# Patient Record
Sex: Female | Born: 1975 | Race: White | Hispanic: No | Smoking: Current every day smoker
Health system: Southern US, Community
[De-identification: ages and names within clinical notes are randomized; demographics above are authoritative.]

## PROBLEM LIST (undated history)

## (undated) DIAGNOSIS — A539 Syphilis, unspecified: Secondary | ICD-10-CM

## (undated) DIAGNOSIS — K760 Fatty (change of) liver, not elsewhere classified: Secondary | ICD-10-CM

## (undated) DIAGNOSIS — Z8619 Personal history of other infectious and parasitic diseases: Secondary | ICD-10-CM

## (undated) DIAGNOSIS — R51 Headache: Secondary | ICD-10-CM

## (undated) DIAGNOSIS — F1491 Cocaine use, unspecified, in remission: Secondary | ICD-10-CM

## (undated) DIAGNOSIS — D069 Carcinoma in situ of cervix, unspecified: Secondary | ICD-10-CM

## (undated) DIAGNOSIS — A499 Bacterial infection, unspecified: Secondary | ICD-10-CM

## (undated) DIAGNOSIS — Z87440 Personal history of urinary (tract) infections: Secondary | ICD-10-CM

## (undated) DIAGNOSIS — IMO0002 Reserved for concepts with insufficient information to code with codable children: Secondary | ICD-10-CM

## (undated) DIAGNOSIS — F1010 Alcohol abuse, uncomplicated: Secondary | ICD-10-CM

## (undated) DIAGNOSIS — R87619 Unspecified abnormal cytological findings in specimens from cervix uteri: Secondary | ICD-10-CM

## (undated) DIAGNOSIS — R102 Pelvic and perineal pain: Secondary | ICD-10-CM

## (undated) DIAGNOSIS — Z87898 Personal history of other specified conditions: Secondary | ICD-10-CM

## (undated) DIAGNOSIS — R002 Palpitations: Secondary | ICD-10-CM

## (undated) DIAGNOSIS — T7840XA Allergy, unspecified, initial encounter: Secondary | ICD-10-CM

## (undated) DIAGNOSIS — F1990 Other psychoactive substance use, unspecified, uncomplicated: Secondary | ICD-10-CM

## (undated) DIAGNOSIS — A630 Anogenital (venereal) warts: Secondary | ICD-10-CM

## (undated) DIAGNOSIS — A599 Trichomoniasis, unspecified: Secondary | ICD-10-CM

## (undated) DIAGNOSIS — B379 Candidiasis, unspecified: Secondary | ICD-10-CM

## (undated) DIAGNOSIS — N926 Irregular menstruation, unspecified: Secondary | ICD-10-CM

## (undated) DIAGNOSIS — F419 Anxiety disorder, unspecified: Secondary | ICD-10-CM

## (undated) DIAGNOSIS — I10 Essential (primary) hypertension: Secondary | ICD-10-CM

## (undated) HISTORY — DX: Allergy, unspecified, initial encounter: T78.40XA

## (undated) HISTORY — PX: OTHER SURGICAL HISTORY: SHX169

## (undated) HISTORY — DX: Essential (primary) hypertension: I10

## (undated) HISTORY — DX: Anogenital (venereal) warts: A63.0

## (undated) HISTORY — DX: Reserved for concepts with insufficient information to code with codable children: IMO0002

## (undated) HISTORY — DX: Pelvic and perineal pain: R10.2

## (undated) HISTORY — DX: Syphilis, unspecified: A53.9

## (undated) HISTORY — DX: Personal history of other infectious and parasitic diseases: Z86.19

## (undated) HISTORY — DX: Other psychoactive substance use, unspecified, uncomplicated: F19.90

## (undated) HISTORY — DX: Carcinoma in situ of cervix, unspecified: D06.9

## (undated) HISTORY — DX: Personal history of urinary (tract) infections: Z87.440

## (undated) HISTORY — DX: Alcohol abuse, uncomplicated: F10.10

## (undated) HISTORY — DX: Trichomoniasis, unspecified: A59.9

## (undated) HISTORY — PX: WISDOM TOOTH EXTRACTION: SHX21

## (undated) HISTORY — DX: Palpitations: R00.2

## (undated) HISTORY — DX: Bacterial infection, unspecified: A49.9

## (undated) HISTORY — DX: Personal history of other specified conditions: Z87.898

## (undated) HISTORY — DX: Irregular menstruation, unspecified: N92.6

## (undated) HISTORY — DX: Candidiasis, unspecified: B37.9

## (undated) HISTORY — DX: Unspecified abnormal cytological findings in specimens from cervix uteri: R87.619

## (undated) SURGERY — Surgical Case
Anesthesia: *Unknown

---

## 1998-08-12 ENCOUNTER — Other Ambulatory Visit: Admission: RE | Admit: 1998-08-12 | Discharge: 1998-08-12 | Payer: Self-pay | Admitting: *Deleted

## 1999-12-24 ENCOUNTER — Inpatient Hospital Stay (HOSPITAL_COMMUNITY): Admission: AD | Admit: 1999-12-24 | Discharge: 1999-12-26 | Payer: Self-pay | Admitting: Obstetrics and Gynecology

## 2000-01-25 ENCOUNTER — Other Ambulatory Visit: Admission: RE | Admit: 2000-01-25 | Discharge: 2000-01-25 | Payer: Self-pay | Admitting: Obstetrics & Gynecology

## 2002-12-03 ENCOUNTER — Encounter: Admission: RE | Admit: 2002-12-03 | Discharge: 2002-12-03 | Payer: Self-pay | Admitting: Obstetrics and Gynecology

## 2003-10-17 ENCOUNTER — Ambulatory Visit (HOSPITAL_COMMUNITY): Admission: RE | Admit: 2003-10-17 | Discharge: 2003-10-17 | Payer: Self-pay | Admitting: Ophthalmology

## 2003-10-17 ENCOUNTER — Encounter (INDEPENDENT_AMBULATORY_CARE_PROVIDER_SITE_OTHER): Payer: Self-pay | Admitting: *Deleted

## 2005-12-19 DIAGNOSIS — D069 Carcinoma in situ of cervix, unspecified: Secondary | ICD-10-CM

## 2005-12-19 HISTORY — DX: Carcinoma in situ of cervix, unspecified: D06.9

## 2007-01-08 ENCOUNTER — Encounter (INDEPENDENT_AMBULATORY_CARE_PROVIDER_SITE_OTHER): Payer: Self-pay | Admitting: Specialist

## 2007-01-08 ENCOUNTER — Ambulatory Visit (HOSPITAL_COMMUNITY): Admission: RE | Admit: 2007-01-08 | Discharge: 2007-01-08 | Payer: Self-pay | Admitting: Obstetrics and Gynecology

## 2007-01-08 ENCOUNTER — Inpatient Hospital Stay (HOSPITAL_COMMUNITY): Admission: AD | Admit: 2007-01-08 | Discharge: 2007-01-08 | Payer: Self-pay | Admitting: Obstetrics and Gynecology

## 2007-12-20 DIAGNOSIS — Z87898 Personal history of other specified conditions: Secondary | ICD-10-CM

## 2007-12-20 HISTORY — DX: Personal history of other specified conditions: Z87.898

## 2008-12-19 DIAGNOSIS — R002 Palpitations: Secondary | ICD-10-CM

## 2008-12-19 HISTORY — DX: Palpitations: R00.2

## 2009-12-19 DIAGNOSIS — A630 Anogenital (venereal) warts: Secondary | ICD-10-CM

## 2009-12-19 HISTORY — DX: Anogenital (venereal) warts: A63.0

## 2010-01-22 ENCOUNTER — Encounter: Admission: RE | Admit: 2010-01-22 | Discharge: 2010-01-22 | Payer: Self-pay | Admitting: Family Medicine

## 2011-04-08 DIAGNOSIS — G43909 Migraine, unspecified, not intractable, without status migrainosus: Secondary | ICD-10-CM | POA: Insufficient documentation

## 2011-05-06 NOTE — Op Note (Signed)
NAMEAUBREYANNA, [name] NO.:  [id number]   MEDICAL RECORD NO.:  [id number]          PATIENT TYPE:  AMB   LOCATION:  SDC                           FACILITY:  [facility]   PHYSICIAN:  Janine Limbo, M.D.DATE OF BIRTH:  1976-02-24   DATE OF PROCEDURE:  01/08/2007  DATE OF DISCHARGE:                               OPERATIVE REPORT   PREOPERATIVE DIAGNOSIS:  Recurrent cervical intraepithelial neoplasia  III.   POSTOPERATIVE DIAGNOSIS:  Recurrent cervical intraepithelial neoplasia  III.   PROCEDURE:  Laser conization of the cervix.   SURGEON:  Dr. Leonard Schwartz.   FIRST ASSISTANT:  None.   ANESTHETIC:  General.   DISPOSITION:  [name] is a 35 year old female who has recurrent  cervical intraepithelial neoplasia III.  She has had a prior cryotherapy  and a prior conization of cervix.  She understands the indications for  her procedure and she accepts the risk of, but not limited to,  anesthetic complications, bleeding, infection, and possible damage to  the surrounding organs.   FINDINGS:  The uterus was normal size.  No adnexal masses were  appreciated.  The cervix was carefully inspected using the colposcope.  We also applied Lugol's solution to the cervix.  There was a small area  of nonstaining at the OS of the cervix at approximately 11 o'clock.   PROCEDURE:  The patient was taken to the operating room where a general  anesthetic was given.  The patient's perineum was prepped with Betadine.  The bladder was drained of urine.  The patient was sterilely draped  including wet towels.  A defocused beam of the laser was tested.  The  laser was set 10 watts.  Colposcopy was performed.  The cervix was  painted with acetic acid and colposcopy was again performed.  Lugol's  solution was placed on the cervix.  There was a small nonstaining area  at approximately 11 o'clock.  We then outlined that area of the small  conization using a defocused beam of  the laser.  The laser was then set  at 10 watts and we ablated the periphery of the cervix and removed a  very shallow small conization specimen.  The specimen was sent to  pathology for evaluation.  We then ablated the area of the cervix that  had been cut using the laser.  We ablated the periphery.  Hemostasis was  achieved using Monsel solution and pressure.  The endocervix was sounded  with a Betadine-soaked uterine sound.  The endocervical os was noted to  be patent.  The patient was given 30 mg of Toradol IV and 30 mg of  Toradol IM intraoperatively.  She was awakened from her anesthetic  without difficulty.  She was taken to the recovery room in stable  condition.   FOLLOW-UP INSTRUCTIONS:  The patient was given a prescription for  Vicodin and she will take 1-2 tablets every 4 hours as needed for pain.  She will use ibuprofen 800 mg every hours as needed for mild to moderate  pain.  She will return to see Dr. Stefano Gaul in 2-3  weeks for follow-up  examination.  She was given a copy of the postoperative instruction  sheet as prepared by the [facility] for patients who  have undergone a dilatation and curettage, but then was modified for a  conization of the cervix.      Janine Limbo, M.D.  Electronically Signed     AVS/MEDQ  D:  01/08/2007  T:  01/08/2007  Job:  [id number]

## 2011-05-06 NOTE — H&P (Signed)
[name], BOYKO NO.:  [id number]   MEDICAL RECORD NO.:  [id number]          PATIENT TYPE:  AMB   LOCATION:  SDC                           [facility]:  [facility]   PHYSICIAN:  Janine Limbo, M.D.DATE OF BIRTH:  1976-08-24   DATE OF ADMISSION:  DATE OF DISCHARGE:                              HISTORY & PHYSICAL   PREADMISSION HISTORY AND PHYSICAL   HISTORY OF PRESENT ILLNESS:  [name] is a 35 year old female, para  1-0-0-1, who presents for laser conization of the cervix.  The patient  has been followed at the [facility] and Gynecology  Division of [facility].  The patient has had multiple  abnormal Pap smears in the past.  Colposcopically-directed biopsy showed  cervical intraepithelial neoplasia III.  The patient has had this  diagnosis in the past, and she has been treated with cryosurgery as well  as a conization of the cervix.  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   DRUG ALLERGIES:  No known drug allergies.   OBSTETRICAL HISTORY:  The patient has had one vaginal delivery at term.   PAST MEDICAL HISTORY:  The patient has had cryotherapy and a conization  as mentioned above.  She is very anxious, although I do not know that a  specific diagnosis has been assigned.   SOCIAL HISTORY:  The patient smokes 10 cigarettes each day.  She drinks  alcohol socially.  She denies other recreational drug uses.   REVIEW OF SYSTEMS:  Noncontributory.   FAMILY HISTORY:  Noncontributory.   PHYSICAL EXAMINATION:  VITAL SIGNS:  Weight is 144 pounds, height is 5  feet 1 inch.  HEENT:  Within normal limits.  CHEST:  Clear.  HEART:  Regular rate and rhythm.  BREASTS:  Without masses.  ABDOMEN:  Nontender.  EXTREMITIES:  Grossly normal.  NEUROLOGIC EXAM:  Grossly normal.  PELVIC EXAM:  External genitalia is normal, vagina is normal, cervix is  nontender, the uterus is normal size, shape, and consistency,  adnexa no  masses, and rectovaginal exam confirms.   ASSESSMENT:  Recurrent cervical intraepithelial neoplasia III (status  post cryotherapy and status post conization of the cervix).   PLAN:  The patient will undergo a laser conization of the cervix.  She  understands the indications toward her surgical procedure, and she  accepts the risk of, but not limited to, anesthetic complications,  bleeding, infections, and possible damage to the surrounding organs.      Janine Limbo, M.D.  Electronically Signed     AVS/MEDQ  D:  01/05/2007  T:  01/05/2007  Job:  [id number]

## 2011-09-20 DIAGNOSIS — Z87898 Personal history of other specified conditions: Secondary | ICD-10-CM

## 2011-09-20 HISTORY — DX: Personal history of other specified conditions: Z87.898

## 2011-11-17 ENCOUNTER — Encounter (HOSPITAL_COMMUNITY): Payer: Self-pay | Admitting: *Deleted

## 2011-11-18 ENCOUNTER — Other Ambulatory Visit: Payer: Self-pay | Admitting: Obstetrics and Gynecology

## 2011-11-22 ENCOUNTER — Encounter (HOSPITAL_COMMUNITY): Payer: Self-pay | Admitting: Pharmacist

## 2011-11-25 NOTE — H&P (Signed)
[name], [name] NO.:  [id number]  MEDICAL RECORD NO.:  [id number]  LOCATION:  PERIO                         [facility]ACILITY:  [facility]  PHYSICIAN:  Janine Limbo, M.D.DATE OF BIRTH:  [birth year]  DATE OF ADMISSION:  11/17/2011 DATE OF DISCHARGE:                             HISTORY & PHYSICAL   DATE FOR SURGERY:  November 30, 2011.  HISTORY OF PRESENT ILLNESS:  Ms. Munford is a 35 year old female, para 1-0-0-1, who presents for a conization of the cervix, and dilatation and curettage.  The patient has been followed at the [facility] and Gynecology Division of [facility]. The patient has a past history of cervical intraepithelial neoplasia III.  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 The patient had a conization of the cervix performed in January 2008. The pathology report returned showing a low-grade squamous intraepithelial lesion.  The margins were not involved.  The patient had a Pap smear in October 2012 that showed a high-grade squamous intraepithelial lesion.  Colposcopic biopsies were performed.  The pathology report returned showing mild dysplasia with HPV infection, focal high-grade dysplasia, and the endocervical curettage returned showing adenocarcinoma in situ and fragments of high-grade squamous intraepithelial lesion.  The patient is currently not sexually active, and she does not use contraception.  DRUG ALLERGIES:  No known drug allergies.  OBSTETRICAL HISTORY:  The patient has had one term vaginal delivery.  PAST MEDICAL HISTORY:  The patient has a history of anxiety.  SOCIAL HISTORY:  The patient smokes 1 pack of cigarettes each day.  She drinks alcohol socially.  She denies other recreational drug uses.  REVIEW OF SYSTEMS:  The patient has anxiety symptoms.  FAMILY HISTORY:  Noncontributory.  PHYSICAL EXAM:  VITAL SIGNS:  Weight is 137 pounds, height is 5 feet  1/2 inches. HEENT:  Within normal limits. CHEST:  Clear. HEART:  Regular rate and rhythm. BREAST:  Her breasts are without masses. ABDOMEN:  Nontender. EXTREMITIES:  Grossly normal. NEUROLOGIC:  Grossly Normal. PELVIC:  External genitalia is normal.  Vagina is normal.  Cervix has slight scarring from her prior conization.  The uterus is normal size, shape, and consistency, and adnexa confirms, rectovaginal exam confirms.  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 2. History of cervical dysplasia. 3. Status post prior conization of the cervix. 4. Anxiety.  PLAN:  The patient will undergo a cold-knife conization of the cervix, and a dilatation and curettage.  She understands the indications for her surgical procedure, and she accepts the risks of, but not limited to, anesthetic complications, bleeding, infections, and possible damage to the surrounding organs.     Janine Limbo, M.D.     AVS/MEDQ  D:  11/25/2011  T:  11/25/2011  Job:  [id number]

## 2011-11-30 ENCOUNTER — Encounter (HOSPITAL_COMMUNITY)
Admission: RE | Disposition: A | Discharge status: Home / Self Care | Payer: Self-pay | Source: Ambulatory Visit | Attending: Obstetrics and Gynecology

## 2011-11-30 ENCOUNTER — Encounter (HOSPITAL_COMMUNITY): Payer: Self-pay | Admitting: Anesthesiology

## 2011-11-30 ENCOUNTER — Ambulatory Visit (HOSPITAL_COMMUNITY): Payer: Self-pay | Admitting: Anesthesiology

## 2011-11-30 ENCOUNTER — Encounter (HOSPITAL_COMMUNITY): Payer: Self-pay | Admitting: *Deleted

## 2011-11-30 ENCOUNTER — Ambulatory Visit (HOSPITAL_COMMUNITY)
Admission: RE | Admit: 2011-11-30 | Discharge: 2011-11-30 | Disposition: A | Discharge status: Home / Self Care | Payer: Self-pay | Source: Ambulatory Visit | Attending: Obstetrics and Gynecology | Admitting: Obstetrics and Gynecology

## 2011-11-30 ENCOUNTER — Other Ambulatory Visit: Payer: Self-pay | Admitting: Obstetrics and Gynecology

## 2011-11-30 DIAGNOSIS — D069 Carcinoma in situ of cervix, unspecified: Secondary | ICD-10-CM

## 2011-11-30 DIAGNOSIS — C539 Malignant neoplasm of cervix uteri, unspecified: Secondary | ICD-10-CM | POA: Insufficient documentation

## 2011-11-30 HISTORY — DX: Headache: R51

## 2011-11-30 HISTORY — DX: Anxiety disorder, unspecified: F41.9

## 2011-11-30 HISTORY — PX: DILATION AND CURETTAGE OF UTERUS: SHX78

## 2011-11-30 HISTORY — DX: Carcinoma in situ of cervix, unspecified: D06.9

## 2011-11-30 HISTORY — PX: CERVICAL CONIZATION W/BX: SHX1330

## 2011-11-30 LAB — CBC
HCT: 40.9 % (ref 36.0–46.0)
Hemoglobin: 13.8 g/dL (ref 12.0–15.0)
MCH: 32.6 pg (ref 26.0–34.0)
MCHC: 33.7 g/dL (ref 30.0–36.0)
MCV: 96.7 fL (ref 78.0–100.0)
Platelets: 232 10*3/uL (ref 150–400)
RBC: 4.23 MIL/uL (ref 3.87–5.11)
RDW: 13.5 % (ref 11.5–15.5)
WBC: 9.8 10*3/uL (ref 4.0–10.5)

## 2011-11-30 LAB — RAPID URINE DRUG SCREEN, HOSP PERFORMED
Amphetamines: NOT DETECTED
Barbiturates: POSITIVE — AB
Benzodiazepines: NOT DETECTED
Cocaine: NOT DETECTED
Opiates: NOT DETECTED
Tetrahydrocannabinol: NOT DETECTED

## 2011-11-30 LAB — PREGNANCY, URINE: Preg Test, Ur: NEGATIVE

## 2011-11-30 SURGERY — CONE BIOPSY, CERVIX
Anesthesia: General | Site: Uterus | Wound class: Clean Contaminated

## 2011-11-30 MED ORDER — LIDOCAINE HCL (CARDIAC) 20 MG/ML IV SOLN
INTRAVENOUS | Status: AC
  Filled 2011-11-30: qty 5

## 2011-11-30 MED ORDER — ONDANSETRON HCL 4 MG/2ML IJ SOLN
INTRAMUSCULAR | Status: DC | PRN
  Administered 2011-11-30: 4 mg via INTRAVENOUS

## 2011-11-30 MED ORDER — MIDAZOLAM HCL 5 MG/5ML IJ SOLN
INTRAMUSCULAR | Status: DC | PRN
  Administered 2011-11-30: 2 mg via INTRAVENOUS

## 2011-11-30 MED ORDER — DEXAMETHASONE SODIUM PHOSPHATE 10 MG/ML IJ SOLN
INTRAMUSCULAR | Status: AC
  Filled 2011-11-30: qty 1

## 2011-11-30 MED ORDER — HYDROMORPHONE HCL PF 1 MG/ML IJ SOLN
INTRAMUSCULAR | Status: AC
  Administered 2011-11-30: 0.5 mg via INTRAVENOUS
  Filled 2011-11-30: qty 1

## 2011-11-30 MED ORDER — FENTANYL CITRATE 0.05 MG/ML IJ SOLN
INTRAMUSCULAR | Status: AC
  Filled 2011-11-30: qty 2

## 2011-11-30 MED ORDER — PROPOFOL 10 MG/ML IV EMUL
INTRAVENOUS | Status: AC
  Filled 2011-11-30: qty 20

## 2011-11-30 MED ORDER — KETOROLAC TROMETHAMINE 30 MG/ML IJ SOLN
INTRAMUSCULAR | Status: AC
  Filled 2011-11-30: qty 1

## 2011-11-30 MED ORDER — ONDANSETRON HCL 8 MG PO TABS: 8.0000 mg | ORAL_TABLET | Freq: Three times a day (TID) | ORAL | Status: AC | PRN

## 2011-11-30 MED ORDER — KETOROLAC TROMETHAMINE 10 MG PO TABS: 10.0000 mg | ORAL_TABLET | Freq: Four times a day (QID) | ORAL | Status: AC | PRN

## 2011-11-30 MED ORDER — KETOROLAC TROMETHAMINE 30 MG/ML IJ SOLN
INTRAMUSCULAR | Status: DC | PRN
  Administered 2011-11-30: 30 mg via INTRAVENOUS

## 2011-11-30 MED ORDER — LUGOLS 5 % PO SOLN
ORAL | Status: DC | PRN
  Administered 2011-11-30: 0.1 mL

## 2011-11-30 MED ORDER — METOCLOPRAMIDE HCL 5 MG/ML IJ SOLN: 10.0000 mg | Freq: Once | INTRAMUSCULAR | Status: DC | PRN

## 2011-11-30 MED ORDER — MEPERIDINE HCL 25 MG/ML IJ SOLN: 6.2500 mg | INTRAMUSCULAR | Status: DC | PRN

## 2011-11-30 MED ORDER — ONDANSETRON HCL 4 MG/2ML IJ SOLN
INTRAMUSCULAR | Status: AC
Start: 2011-11-30 — End: 2011-11-30
  Filled 2011-11-30: qty 2

## 2011-11-30 MED ORDER — PROPOFOL 10 MG/ML IV EMUL
INTRAVENOUS | Status: DC | PRN
  Administered 2011-11-30: 200 mg via INTRAVENOUS

## 2011-11-30 MED ORDER — HYDROMORPHONE HCL PF 1 MG/ML IJ SOLN
0.2500 mg | INTRAMUSCULAR | Status: AC | PRN
  Administered 2011-11-30 (×8): 0.5 mg via INTRAVENOUS

## 2011-11-30 MED ORDER — KETOROLAC TROMETHAMINE 60 MG/2ML IM SOLN
INTRAMUSCULAR | Status: AC
  Filled 2011-11-30: qty 2

## 2011-11-30 MED ORDER — BUPIVACAINE-EPINEPHRINE 0.5% -1:200000 IJ SOLN
INTRAMUSCULAR | Status: DC | PRN
  Administered 2011-11-30: 20 mL

## 2011-11-30 MED ORDER — FENTANYL CITRATE 0.05 MG/ML IJ SOLN
INTRAMUSCULAR | Status: DC | PRN
  Administered 2011-11-30: 100 ug via INTRAVENOUS

## 2011-11-30 MED ORDER — LACTATED RINGERS IV SOLN
INTRAVENOUS | Status: DC
  Administered 2011-11-30 (×2): via INTRAVENOUS

## 2011-11-30 MED ORDER — KETOROLAC TROMETHAMINE 60 MG/2ML IM SOLN
INTRAMUSCULAR | Status: DC | PRN
  Administered 2011-11-30: 30 mg via INTRAMUSCULAR

## 2011-11-30 MED ORDER — LIDOCAINE HCL (CARDIAC) 20 MG/ML IV SOLN
INTRAVENOUS | Status: DC | PRN
  Administered 2011-11-30: 60 mg via INTRAVENOUS

## 2011-11-30 MED ORDER — DEXAMETHASONE SODIUM PHOSPHATE 4 MG/ML IJ SOLN
INTRAMUSCULAR | Status: DC | PRN
  Administered 2011-11-30: 10 mg via INTRAVENOUS

## 2011-11-30 MED ORDER — MIDAZOLAM HCL 2 MG/2ML IJ SOLN
INTRAMUSCULAR | Status: AC
  Filled 2011-11-30: qty 2

## 2011-11-30 MED ORDER — OXYCODONE-ACETAMINOPHEN 5-325 MG PO TABS: 1.0000 | ORAL_TABLET | ORAL | Status: AC | PRN

## 2011-11-30 SURGICAL SUPPLY — 29 items
APPLICATOR COTTON TIP 6IN STRL (MISCELLANEOUS) IMPLANT
BLADE SURG 11 STRL SS (BLADE) ×2 IMPLANT
CATH ROBINSON RED A/P 16FR (CATHETERS) ×2 IMPLANT
CLOTH BEACON ORANGE TIMEOUT ST (SAFETY) ×2 IMPLANT
CONTAINER PREFILL 10% NBF 60ML (FORM) ×4 IMPLANT
COUNTER NEEDLE 1200 MAGNETIC (NEEDLE) ×2 IMPLANT
DECANTER SPIKE VIAL GLASS SM (MISCELLANEOUS) ×2 IMPLANT
ELECT REM PT RETURN 9FT ADLT (ELECTROSURGICAL) ×2
ELECTRODE REM PT RTRN 9FT ADLT (ELECTROSURGICAL) ×1 IMPLANT
GAUZE SPONGE 4X4 16PLY XRAY LF (GAUZE/BANDAGES/DRESSINGS) IMPLANT
GLOVE BIOGEL PI IND STRL 8.5 (GLOVE) ×1 IMPLANT
GLOVE BIOGEL PI INDICATOR 8.5 (GLOVE) ×1
GLOVE ECLIPSE 8.0 STRL XLNG CF (GLOVE) ×4 IMPLANT
GOWN PREVENTION PLUS LG XLONG (DISPOSABLE) ×2 IMPLANT
NEEDLE SPNL 22GX3.5 QUINCKE BK (NEEDLE) ×2 IMPLANT
NS IRRIG 1000ML POUR BTL (IV SOLUTION) ×2 IMPLANT
PACK VAGINAL MINOR WOMEN LF (CUSTOM PROCEDURE TRAY) ×2 IMPLANT
PAD PREP 24X48 CUFFED NSTRL (MISCELLANEOUS) ×2 IMPLANT
PENCIL BUTTON HOLSTER BLD 10FT (ELECTRODE) ×2 IMPLANT
SCOPETTES 8  STERILE (MISCELLANEOUS) ×2
SCOPETTES 8 STERILE (MISCELLANEOUS) ×2 IMPLANT
SPONGE SURGIFOAM ABS GEL 12-7 (HEMOSTASIS) IMPLANT
SUT VICRYL 0 UR6 27IN ABS (SUTURE) ×10 IMPLANT
SYR CONTROL 10ML LL (SYRINGE) ×2 IMPLANT
SYR TB 1ML 25GX5/8 (SYRINGE) IMPLANT
TOWEL OR 17X24 6PK STRL BLUE (TOWEL DISPOSABLE) ×4 IMPLANT
TUBING NON-CON 1/4 X 20 CONN (TUBING) ×2 IMPLANT
WATER STERILE IRR 1000ML POUR (IV SOLUTION) IMPLANT
YANKAUER SUCT BULB TIP NO VENT (SUCTIONS) ×2 IMPLANT

## 2011-11-30 NOTE — Progress Notes (Signed)
Pt appears to be much more comfortable. Patient is sitting up in bed, sipping on water.  Pt has stopped crying at this time.  Pt states that her pan is still a 6/10.  Pt is very pleasant at this time.

## 2011-11-30 NOTE — Anesthesia Postprocedure Evaluation (Signed)
  Anesthesia Post-op Note  Patient: [name]  Procedure(s) Performed:  CONIZATION CERVIX WITH BIOPSY; DILATATION AND CURETTAGE (D&C)  Patient Location: PACU  Anesthesia Type: General  Level of Consciousness: awake, alert  and oriented  Airway and Oxygen Therapy: Patient Spontanous Breathing  Post-op Pain: mild  Post-op Assessment: Post-op Vital signs reviewed, Patient's Cardiovascular Status Stable, Respiratory Function Stable, Patent Airway, No signs of Nausea or vomiting, Adequate PO intake and Pain level controlled  Post-op Vital Signs: Reviewed and stable  Complications: No apparent anesthesia complications

## 2011-11-30 NOTE — H&P (Signed)
The patient was interviewed and examined today.  The previously documented history and physical examination was reviewed. There are no changes except that the patient states that she is on Methadone for opiate addiction. The operative procedure was reviewed. The risks and benefits were outlined again. The specific risks include, but are not limited to, anesthetic complications, bleeding, infections, and possible damage to the surrounding organs. The patient's questions were answered.  We are ready to proceed as outlined. The likelihood of the patient achieving the goals of this procedure is very likely.   Leonard Schwartz, M.D.

## 2011-11-30 NOTE — Transfer of Care (Signed)
Immediate Anesthesia Transfer of Care Note  Patient: [name]  Procedure(s) Performed:  CONIZATION CERVIX WITH BIOPSY; DILATATION AND CURETTAGE (D&C)  Patient Location: PACU  Anesthesia Type: General  Level of Consciousness: awake  Airway & Oxygen Therapy: Patient Spontanous Breathing and Patient connected to nasal cannula oxygen  Post-op Assessment: Report given to PACU RN  Post vital signs: Reviewed and stable  Complications: No apparent anesthesia complications

## 2011-11-30 NOTE — Progress Notes (Signed)
MD at pt bedside and discussing surgery and pain management at home.

## 2011-11-30 NOTE — Op Note (Signed)
OPERATIVE NOTE  [name]  DOB:    24-Oct-1976  MRN:    [id number]  CSN:    [id number]  Date of Surgery:  11/30/2011  Preoperative Diagnosis:  Adenocarcinoma in situ of the endocervix  Postoperative Diagnosis:  Adenocarcinoma in situ of the endocervix  Procedure:  Cold knife conization  Dilatation and curettage  Surgeon:  Leonard Schwartz, M.D.  Assistant:  None  Anesthetic:  General  Disposition:  The patient presents with a history of abnormal Pap smears in the past. She has had 2 conizations in the past which showed low-grade changes. The patient's most recent Pap smear returned showing high-grade changes. Biopsies return showing adenocarcinoma in situ of the endocervix. The patient understands the indications for surgical procedure and she accepts the risk of, but not limited to, anesthetic complications, bleeding, infections, and possible damage to the surrounding organs.  Findings:  The cervix was slightly shortened from her prior conizations. The uterus sounded to 7 cm. No parametrial disease was appreciated.  Procedure:  The patient was taken to the operating room where a general anesthetic was given. The patient's lower abdomen, perineum, and vagina were prepped with multiple layers of Betadine. A Foley catheter was placed in the bladder. The patient was sterilely draped. Examination under anesthesia was performed. A paracervical block was placed using 10 cc of half percent Marcaine with epinephrine. An additional 10 cc of half percent Marcaine with epinephrine were injected directly into the cervix. Sutures were placed at the 3:00 and 9:00 positions of the cervix and tied securely. Lugol solution was placed on the cervix. There were no nonstaining areas on the exocervix. There was slight nonstaining of the endocervical canal. The endocervix was sounded. A circumferential incision was made around the endocervix incorporating all of the nonstaining  areas. The incision was extended in a conelike fashion to the endocervix. A stitch was placed at the 12:00 position of the cone specimen. The specimen was passed to the nurse. The cervix was gently dilated. The cavity was then curetted using a medium sharp curet. Hemostasis was noted to be adequate. An inverting sutures were placed in the cervix at the 12:00, 3:00, 6:00, and 9:00 positions. Again hemostasis was confirmed. All the sutures were removed. The patient's exam was repeated and the uterus was noted to be firm. Sponge, and needle counts were correct. The estimated blood loss for the procedure was 20 cc. The patient tolerated her procedure well. The patient was awakened from her anesthetic without difficulty. She was transported to the recovery room in stable condition. The patient was given 30 mg of Toradol intravenously and 30 mg of Toradol intramuscularly while in the operating room. The conization and the endometrial curettings were sent to pathology.  Followup instructions:  The patient will return to see Dr. Stefano Gaul in 2 weeks. She will call for questions or concerns. She will refrain from driving for one or 2 days, heavy lifting for 1-2 weeks, and intercourse for 6 weeks. She will take Toradol 10 mg by mouth every 6 hours for pain control. We will give her Zofran 8 mg that she can take every 8 hours for nausea.  Leonard Schwartz, M.D.

## 2011-11-30 NOTE — Anesthesia Preprocedure Evaluation (Addendum)
Anesthesia Evaluation  Patient identified by MRN, date of birth, ID band Patient awake    Reviewed: Allergy & Precautions, H&P , Patient's Chart, lab work & pertinent test results, reviewed documented beta blocker date and time   Airway Mallampati: II TM Distance: >3 FB Neck ROM: full    Dental No notable dental hx.    Pulmonary  clear to auscultation  Pulmonary exam normal       Cardiovascular regular Normal    Neuro/Psych    GI/Hepatic   Endo/Other    Renal/GU      Musculoskeletal   Abdominal   Peds  Hematology   Anesthesia Other Findings   Reproductive/Obstetrics                           Anesthesia Physical Anesthesia Plan  ASA: II  Anesthesia Plan: General   Post-op Pain Management:    Induction: Intravenous  Airway Management Planned: LMA  Additional Equipment:   Intra-op Plan:   Post-operative Plan:   Informed Consent: I have reviewed the patients History and Physical, chart, labs and discussed the procedure including the risks, benefits and alternatives for the proposed anesthesia with the patient or authorized representative who has indicated his/her understanding and acceptance.   Dental Advisory Given  Plan Discussed with: CRNA and Surgeon  Anesthesia Plan Comments: (  Discussed  general anesthesia, including possible nausea, instrumentation of airway, sore throat,pulmonary aspiration, etc. I asked if the were any outstanding questions, or  concerns before we proceeded. )        Anesthesia Quick Evaluation  

## 2011-12-01 ENCOUNTER — Inpatient Hospital Stay (HOSPITAL_COMMUNITY)
Admission: AD | Admit: 2011-12-01 | Discharge: 2011-12-01 | Disposition: A | Discharge status: Home / Self Care | Payer: Self-pay | Source: Ambulatory Visit | Attending: Obstetrics and Gynecology | Admitting: Obstetrics and Gynecology

## 2011-12-01 ENCOUNTER — Encounter (HOSPITAL_COMMUNITY): Payer: Self-pay | Admitting: Obstetrics and Gynecology

## 2011-12-01 DIAGNOSIS — G8918 Other acute postprocedural pain: Secondary | ICD-10-CM | POA: Insufficient documentation

## 2011-12-01 LAB — URINALYSIS, ROUTINE W REFLEX MICROSCOPIC
Bilirubin Urine: NEGATIVE
Glucose, UA: NEGATIVE mg/dL
Ketones, ur: NEGATIVE mg/dL
Nitrite: NEGATIVE
Protein, ur: NEGATIVE mg/dL
Specific Gravity, Urine: 1.01 (ref 1.005–1.030)
Urobilinogen, UA: 0.2 mg/dL (ref 0.0–1.0)
pH: 6 (ref 5.0–8.0)

## 2011-12-01 LAB — DIFFERENTIAL
Basophils Absolute: 0 10*3/uL (ref 0.0–0.1)
Basophils Relative: 0 % (ref 0–1)
Eosinophils Absolute: 0.2 10*3/uL (ref 0.0–0.7)
Eosinophils Relative: 2 % (ref 0–5)
Lymphocytes Relative: 30 % (ref 12–46)
Lymphs Abs: 3.3 10*3/uL (ref 0.7–4.0)
Monocytes Absolute: 1.1 10*3/uL — ABNORMAL HIGH (ref 0.1–1.0)
Monocytes Relative: 10 % (ref 3–12)
Neutro Abs: 6.5 10*3/uL (ref 1.7–7.7)
Neutrophils Relative %: 59 % (ref 43–77)

## 2011-12-01 LAB — CBC
HCT: 36.5 % (ref 36.0–46.0)
Hemoglobin: 12.3 g/dL (ref 12.0–15.0)
MCH: 32.9 pg (ref 26.0–34.0)
MCHC: 33.7 g/dL (ref 30.0–36.0)
MCV: 97.6 fL (ref 78.0–100.0)
Platelets: 199 10*3/uL (ref 150–400)
RBC: 3.74 MIL/uL — ABNORMAL LOW (ref 3.87–5.11)
RDW: 13.6 % (ref 11.5–15.5)
WBC: 11 10*3/uL — ABNORMAL HIGH (ref 4.0–10.5)

## 2011-12-01 LAB — URINE MICROSCOPIC-ADD ON

## 2011-12-01 MED ORDER — KETOROLAC TROMETHAMINE 60 MG/2ML IM SOLN
60.0000 mg | Freq: Once | INTRAMUSCULAR | Status: DC
  Filled 2011-12-01: qty 2

## 2011-12-01 NOTE — Progress Notes (Signed)
Had D&C and conization 12/12. Nurse called to check on her. Pain meds not covering, was told to come in .

## 2011-12-01 NOTE — Progress Notes (Signed)
Voiding without problems.

## 2011-12-03 LAB — URINE CULTURE
Colony Count: >=100000
Culture  Setup Time: 201212140146
Special Requests: NORMAL

## 2011-12-05 DIAGNOSIS — R102 Pelvic and perineal pain: Secondary | ICD-10-CM

## 2011-12-05 HISTORY — DX: Pelvic and perineal pain: R10.2

## 2011-12-29 ENCOUNTER — Encounter: Payer: Self-pay | Admitting: Gynecologic Oncology

## 2012-01-02 ENCOUNTER — Encounter: Payer: Self-pay | Admitting: Gynecology

## 2012-01-02 ENCOUNTER — Ambulatory Visit: Payer: Self-pay | Attending: Gynecology | Admitting: Gynecology

## 2012-01-02 VITALS — BP 110/62 | HR 70 | Temp 98.2°F | Resp 18 | Ht 61.61 in | Wt 140.6 lb

## 2012-01-02 DIAGNOSIS — F17200 Nicotine dependence, unspecified, uncomplicated: Secondary | ICD-10-CM | POA: Insufficient documentation

## 2012-01-02 DIAGNOSIS — Z8541 Personal history of malignant neoplasm of cervix uteri: Secondary | ICD-10-CM | POA: Insufficient documentation

## 2012-01-02 DIAGNOSIS — D069 Carcinoma in situ of cervix, unspecified: Secondary | ICD-10-CM | POA: Insufficient documentation

## 2012-01-02 DIAGNOSIS — Z79899 Other long term (current) drug therapy: Secondary | ICD-10-CM | POA: Insufficient documentation

## 2012-01-02 NOTE — Patient Instructions (Signed)
Return to see Dr. Stefano Gaul in the next several weeks to schedule a cold knife conization.

## 2012-01-02 NOTE — Progress Notes (Signed)
Consult Note: Gyn-Onc   [name] 36 y.o. female  Chief Complaint  Patient presents with  . Adenocarcinoma in situ of cervix    New consult      HPI: The patient is seen at the request of Dr. Marline Backbone regarding management of adenocarcinoma in situ of the cervix. Patient underwent a cold knife conization on 11/30/2011. Final pathology showed extensive adenocarcinoma in situ with many margins being positive.  The patient is a past history of adenocarcinoma in situ and HGSIL having undergone a prior laser conization and a cold knife conization. The patient's had an uncomplicated postoperative course. She denies any bleeding or discharge fever chills or pelvic pain. She notes she is about 2 weeks late for her menstrual period. She is not having sexual intercourse and is not using contraception.  Obstetrical history gravida 48 (48 year old daughter)  No Known Allergies  Past Medical History  Diagnosis Date  . Headache   . Anxiety   . Cervical intraepithelial neoplasia III     Past Surgical History  Procedure Date  . Conization cervix     X  2  . A*wisdom teeth ext   . Svd     x 1  . Cervical conization w/bx 11/30/2011    Procedure: CONIZATION CERVIX WITH BIOPSY;  Surgeon: Janine Limbo, MD;  Location: [facility] ORS;  Service: Gynecology;  Laterality: N/A;  . Dilation and curettage of uterus 11/30/2011    Procedure: DILATATION AND CURETTAGE;  Surgeon: Janine Limbo, MD;  Location: [facility] ORS;  Service: Gynecology;  Laterality: N/A;    Current Outpatient Prescriptions  Medication Sig Dispense Refill  . ALPRAZolam (XANAX) 0.25 MG tablet Take 0.25 mg by mouth at bedtime as needed.        . butalbital-acetaminophen-caffeine (FIORICET WITH CODEINE) 50-325-40-30 MG per capsule Take 1 capsule by mouth every 4 (four) hours as needed. For migraines       . methadone (DOLOPHINE) 10 MG tablet Take 40 mg by mouth daily.         History   Social History  . Marital Status:  Single    Spouse Name: N/A    Number of Children: N/A  . Years of Education: N/A   Occupational History  . Not on file.   Social History Main Topics  . Smoking status: Current Everyday Smoker -- 1.0 packs/day for 20 years    Types: Cigarettes  . Smokeless tobacco: Never Used  . Alcohol Use: Yes     socially  . Drug Use: No  . Sexually Active: Not Currently    Birth Control/ Protection: Abstinence   Other Topics Concern  . Not on file   Social History Narrative  . No narrative on file    Family History  Problem Relation Age of Onset  . Cervical cancer Mother   . Breast cancer Mother     Review of Systems: 10 point conference reviewed review of systems negative except as noted above  Vitals: Blood pressure 110/62, pulse 70, temperature 98.2 F (36.8 C), temperature source Oral, resp. rate 18, height 5' 1.61" (1.565 m), weight 140 lb 9.6 oz (63.776 kg), last menstrual period 11/30/2011.  Physical Exam: In general the patient is a healthy white female no acute distress  HEENT is negative  Neck is supple without thyromegaly  There is no supra-auricular or inguinal adenopathy  The abdomen is soft nontender no masses, organomegaly, or ascites    hernias are noted \\Pelvic  exam  EGBUS is  normal vagina is normal with no lesions  cervix status post cold knife conization. It is healing well.  Uterus is anterior normal shape size consistency. There is no tenderness. Adnexa without masses  Assessment/Plan: Recurrent adenocarcinoma in situ of the cervix status post cold knife conization with positive margins.  I would recommend the patient repeat conization with an attempt to extend the conization higher into the endocervical canal. Before offering definitive therapy we would like to make sure that there is no true invasive adenocarcinoma.  I Contacted Dr. Stefano Gaul.Hhe will plan on seeing the patient back in the next few weeks to reschedule the conization. Pending the final  pathology subsequent treatment planning can be performed. If the patient only has adenocarcinoma in situ recommend she undergo a hysterectomy in order to remove the entire cervix given her very high risk status having had multiple conization is previously. She is in agreement with this plan. The ovaries would not need to be removed at that procedure.  I would be happy to see the patient back postoperatively if she has invasive cancer to offer treatment options.   Jeannette Corpus, MD 01/02/2012, 2:01 PM                         Consult Note: Gyn-Onc   [name] 36 y.o. female  Chief Complaint  Patient presents with  . Adenocarcinoma in situ of cervix    New consult    Interval History:   HPI:  No Known Allergies  Past Medical History  Diagnosis Date  . Headache   . Anxiety   . Cervical intraepithelial neoplasia III     Past Surgical History  Procedure Date  . Conization cervix     X  2  . A*wisdom teeth ext   . Svd     x 1  . Cervical conization w/bx 11/30/2011    Procedure: CONIZATION CERVIX WITH BIOPSY;  Surgeon: Janine Limbo, MD;  Location: [facility]S;  Service: Gynecology;  Laterality: N/A;  . Dilation and curettage of uterus 11/30/2011    Procedure: DILATATION AND CURETTAGE;  Surgeon: Janine Limbo, MD;  Location: [facility] ORS;  Service: Gynecology;  Laterality: N/A;    Current Outpatient Prescriptions  Medication Sig Dispense Refill  . ALPRAZolam (XANAX) 0.25 MG tablet Take 0.25 mg by mouth at bedtime as needed.        . butalbital-acetaminophen-caffeine (FIORICET WITH CODEINE) 50-325-40-30 MG per capsule Take 1 capsule by mouth every 4 (four) hours as needed. For migraines       . methadone (DOLOPHINE) 10 MG tablet Take 40 mg by mouth daily.         History   Social History  . Marital Status: Single    Spouse Name: N/A    Number of Children: N/A  . Years of Education: N/A   Occupational History  . Not on file.    Social History Main Topics  . Smoking status: Current Everyday Smoker -- 1.0 packs/day for 20 years    Types: Cigarettes  . Smokeless tobacco: Never Used  . Alcohol Use: Yes     socially  . Drug Use: No  . Sexually Active: Not Currently    Birth Control/ Protection: Abstinence   Other Topics Concern  . Not on file   Social History Narrative  . No narrative on file    Family History  Problem Relation Age of Onset  . Cervical cancer Mother   .  Breast cancer Mother     Review of Systems: 10 point review of systems is negative except as noted above  Vitals: Blood pressure 110/62, pulse 70, temperature 98.2 F (36.8 C), temperature source Oral, resp. rate 18, height 5' 1.61" (1.565 m), weight 140 lb 9.6 oz (63.776 kg), last menstrual period 11/30/2011.  Physical Exam:  Assessment/Plan:   Jeannette Corpus, MD 01/02/2012, 2:01 PM

## 2012-02-27 ENCOUNTER — Other Ambulatory Visit: Payer: Self-pay | Admitting: Obstetrics and Gynecology

## 2012-02-27 ENCOUNTER — Encounter (HOSPITAL_COMMUNITY): Payer: Self-pay | Admitting: *Deleted

## 2012-02-29 ENCOUNTER — Other Ambulatory Visit: Payer: Self-pay | Admitting: Obstetrics and Gynecology

## 2012-03-07 ENCOUNTER — Encounter (HOSPITAL_COMMUNITY): Payer: Self-pay | Admitting: Pharmacist

## 2012-03-14 NOTE — H&P (Signed)
NAME:  [name], [name] NO.:  MEDICAL RECORD NO.:  [id number]  LOCATION:                                 FACILITY:  PHYSICIAN:  Janine Limbo, M.D.DATE OF BIRTH:  August 18, 1976  DATE OF ADMISSION:03/16/2012 DATE OF DISCHARGE:                             HISTORY & PHYSICAL   HISTORY OF PRESENT ILLNESS:  Ms. Hargett is a 36 year old female, para 1-0-0-1, who presents for a cold knife conization of the cervix.  The patient has been followed at the [facility] and Gy[facility]cology Division of [facility].  The patient had a conization of the cervix in 2008 and again in November 2012.  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  The patient was evaluated by the GYN Oncology Service.  The recommendation was to repeat a conization to document that there was no evidence of invasive carcinoma.  A hysterectomy is recommended if there is no sign of invasive carcinoma. At this point, the patient does not wish to maintain her childbearing potential.  The patient is currently not sexually active.  She does not use contraception.  The margins of the conization specimen from November 2012, were positive.  DRUG ALLERGIES:  No known drug allergies.  OBSTETRICAL HISTORY:  The patient has had 1 term vaginal delivery.  PAST MEDICAL HISTORY:  The patient has a history of anxiety.  She also has a history of substance abuse.  She is currently in a treatment program.  She is not on medication for her anxiety.  SOCIAL HISTORY:  The patient smokes 1 pack of cigarettes each day.  She drinks alcohol socially.  REVIEW OF SYSTEMS:  The patient has anxiety symptomatology.  FAMILY HISTORY:  Noncontributory.  PHYSICAL EXAMINATION:  VITAL SIGNS:  Height is 5 feet 1-1/2 inches, weight is 137 pounds. HEENT:  Within normal limits. CHEST:  Clear. HEART:  Regular rate and rhythm. BREASTS:   Without masses. ABDOMEN:  Nontender. EXTREMITIES:  Grossly normal. NEUROLOGIC:  Grossly normal. PELVIC:  External genitalia is normal.  Vagina is normal.  Cervix is scarred from her prior procedures.  The uterus is normal size, shape, and consistency.  Adnexa, no masses and rectovaginal exam confirms.  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 2. History of prior conizations of the cervix. 3. Anxiety. 4. History of substance abuse, and the patient is currently in a     treatment program.  PLAN:  The patient will undergo a repeat conization of the cervix.  She understands the indications for her surgical procedure, and she accepts the risks of, but not limited to, anesthetic complications, bleeding, infections, and possible damage to the surrounding organs.     Janine Limbo, M.D.     AVS/MEDQ  D:  03/13/2012  T:  03/14/2012  Job:  [id number]  cc:   De Blanch, M.D.  Telford Nab, R.N. [address] [place], [place] [address]

## 2012-03-16 ENCOUNTER — Encounter (HOSPITAL_COMMUNITY)
Admission: RE | Disposition: A | Discharge status: Home Health | Payer: Self-pay | Source: Ambulatory Visit | Attending: Obstetrics and Gynecology

## 2012-03-16 ENCOUNTER — Ambulatory Visit (HOSPITAL_COMMUNITY)
Admission: RE | Admit: 2012-03-16 | Discharge: 2012-03-16 | Disposition: A | Discharge status: Home Health | Payer: Self-pay | Source: Ambulatory Visit | Attending: Obstetrics and Gynecology | Admitting: Obstetrics and Gynecology

## 2012-03-16 ENCOUNTER — Encounter (HOSPITAL_COMMUNITY): Payer: Self-pay | Admitting: Anesthesiology

## 2012-03-16 ENCOUNTER — Encounter (HOSPITAL_COMMUNITY): Payer: Self-pay | Admitting: *Deleted

## 2012-03-16 ENCOUNTER — Ambulatory Visit (HOSPITAL_COMMUNITY): Payer: Self-pay | Admitting: Anesthesiology

## 2012-03-16 DIAGNOSIS — G8918 Other acute postprocedural pain: Secondary | ICD-10-CM

## 2012-03-16 DIAGNOSIS — D069 Carcinoma in situ of cervix, unspecified: Secondary | ICD-10-CM

## 2012-03-16 HISTORY — PX: CERVICAL CONIZATION W/BX: SHX1330

## 2012-03-16 LAB — PREGNANCY, URINE: Preg Test, Ur: NEGATIVE

## 2012-03-16 LAB — CBC
HCT: 42.1 % (ref 36.0–46.0)
Hemoglobin: 14.3 g/dL (ref 12.0–15.0)
MCH: 34.3 pg — ABNORMAL HIGH (ref 26.0–34.0)
MCHC: 34 g/dL (ref 30.0–36.0)
MCV: 101 fL — ABNORMAL HIGH (ref 78.0–100.0)
Platelets: 213 10*3/uL (ref 150–400)
RBC: 4.17 MIL/uL (ref 3.87–5.11)
RDW: 12.9 % (ref 11.5–15.5)
WBC: 6.6 10*3/uL (ref 4.0–10.5)

## 2012-03-16 SURGERY — CONE BIOPSY, CERVIX
Anesthesia: General | Site: Vagina | Wound class: Clean Contaminated

## 2012-03-16 MED ORDER — ONDANSETRON HCL 8 MG PO TABS: 8.0000 mg | ORAL_TABLET | Freq: Three times a day (TID) | ORAL | Status: DC | PRN

## 2012-03-16 MED ORDER — HYDROMORPHONE HCL 2 MG PO TABS
ORAL_TABLET | ORAL | Status: AC
  Filled 2012-03-16: qty 1

## 2012-03-16 MED ORDER — LIDOCAINE HCL (CARDIAC) 20 MG/ML IV SOLN
INTRAVENOUS | Status: DC | PRN
  Administered 2012-03-16: 100 mg via INTRAVENOUS

## 2012-03-16 MED ORDER — PROPOFOL 10 MG/ML IV EMUL
INTRAVENOUS | Status: AC
  Filled 2012-03-16: qty 20

## 2012-03-16 MED ORDER — FENTANYL CITRATE 0.05 MG/ML IJ SOLN
INTRAMUSCULAR | Status: AC
  Filled 2012-03-16: qty 5

## 2012-03-16 MED ORDER — LIDOCAINE HCL (CARDIAC) 20 MG/ML IV SOLN
INTRAVENOUS | Status: AC
  Filled 2012-03-16: qty 5

## 2012-03-16 MED ORDER — VASOPRESSIN 20 UNIT/ML IJ SOLN
INTRAMUSCULAR | Status: AC
  Filled 2012-03-16: qty 1

## 2012-03-16 MED ORDER — BUPIVACAINE-EPINEPHRINE 0.5% -1:200000 IJ SOLN
INTRAMUSCULAR | Status: DC | PRN
  Administered 2012-03-16: 20 mL

## 2012-03-16 MED ORDER — KETOROLAC TROMETHAMINE 30 MG/ML IJ SOLN
INTRAMUSCULAR | Status: DC | PRN
  Administered 2012-03-16: 30 mg via INTRAMUSCULAR
  Administered 2012-03-16: 30 mg via INTRAVENOUS

## 2012-03-16 MED ORDER — METOCLOPRAMIDE HCL 5 MG/ML IJ SOLN: 10.0000 mg | Freq: Once | INTRAMUSCULAR | Status: DC | PRN

## 2012-03-16 MED ORDER — HYDROMORPHONE HCL PF 1 MG/ML IJ SOLN
INTRAMUSCULAR | Status: AC
  Administered 2012-03-16: 0.5 mg via INTRAVENOUS
  Filled 2012-03-16: qty 1

## 2012-03-16 MED ORDER — KETOROLAC TROMETHAMINE 30 MG/ML IJ SOLN
INTRAMUSCULAR | Status: AC
  Filled 2012-03-16: qty 1

## 2012-03-16 MED ORDER — IBUPROFEN 200 MG PO TABS: 800.0000 mg | ORAL_TABLET | Freq: Three times a day (TID) | ORAL | Status: AC | PRN

## 2012-03-16 MED ORDER — LACTATED RINGERS IV SOLN
INTRAVENOUS | Status: DC
  Administered 2012-03-16 (×2): via INTRAVENOUS

## 2012-03-16 MED ORDER — HYDROMORPHONE HCL PF 1 MG/ML IJ SOLN
0.2500 mg | INTRAMUSCULAR | Status: DC | PRN
  Administered 2012-03-16 (×4): 0.5 mg via INTRAVENOUS

## 2012-03-16 MED ORDER — MIDAZOLAM HCL 5 MG/5ML IJ SOLN
INTRAMUSCULAR | Status: DC | PRN
  Administered 2012-03-16: 2 mg via INTRAVENOUS

## 2012-03-16 MED ORDER — FERRIC SUBSULFATE SOLN
Status: DC | PRN
  Administered 2012-03-16: 1

## 2012-03-16 MED ORDER — MIDAZOLAM HCL 2 MG/2ML IJ SOLN
INTRAMUSCULAR | Status: AC
  Filled 2012-03-16: qty 2

## 2012-03-16 MED ORDER — PROPOFOL 10 MG/ML IV EMUL
INTRAVENOUS | Status: DC | PRN
  Administered 2012-03-16: 200 mg via INTRAVENOUS

## 2012-03-16 MED ORDER — IODINE STRONG (LUGOLS) 5 % PO SOLN
ORAL | Status: DC | PRN
  Administered 2012-03-16: 4 mL

## 2012-03-16 MED ORDER — MEPERIDINE HCL 25 MG/ML IJ SOLN: 6.2500 mg | INTRAMUSCULAR | Status: DC | PRN

## 2012-03-16 MED ORDER — ONDANSETRON HCL 4 MG/2ML IJ SOLN
INTRAMUSCULAR | Status: AC
  Filled 2012-03-16: qty 2

## 2012-03-16 MED ORDER — FENTANYL CITRATE 0.05 MG/ML IJ SOLN
INTRAMUSCULAR | Status: DC | PRN
  Administered 2012-03-16: 100 ug via INTRAVENOUS
  Administered 2012-03-16 (×3): 50 ug via INTRAVENOUS

## 2012-03-16 MED ORDER — BUPIVACAINE-EPINEPHRINE (PF) 0.5% -1:200000 IJ SOLN
INTRAMUSCULAR | Status: AC
  Filled 2012-03-16: qty 10

## 2012-03-16 MED ORDER — ACETIC ACID 4% SOLUTION
Status: DC | PRN
  Administered 2012-03-16: 1 via TOPICAL

## 2012-03-16 MED ORDER — HYDROMORPHONE HCL 2 MG PO TABS: 2.0000 mg | ORAL_TABLET | ORAL | Status: AC | PRN

## 2012-03-16 MED ORDER — ONDANSETRON HCL 4 MG/2ML IJ SOLN
INTRAMUSCULAR | Status: DC | PRN
  Administered 2012-03-16: 4 mg via INTRAVENOUS

## 2012-03-16 MED ORDER — HYDROMORPHONE HCL 2 MG PO TABS
2.0000 mg | ORAL_TABLET | Freq: Once | ORAL | Status: AC
Start: 2012-03-16 — End: 2012-03-16
  Administered 2012-03-16: 2 mg via ORAL

## 2012-03-16 SURGICAL SUPPLY — 32 items
APPLICATOR COTTON TIP 6IN STRL (MISCELLANEOUS) IMPLANT
BLADE SURG 11 STRL SS (BLADE) ×2 IMPLANT
CATH ROBINSON RED A/P 16FR (CATHETERS) ×4 IMPLANT
CLOTH BEACON ORANGE TIMEOUT ST (SAFETY) ×2 IMPLANT
CONTAINER PREFILL 10% NBF 60ML (FORM) ×2 IMPLANT
COUNTER NEEDLE 1200 MAGNETIC (NEEDLE) IMPLANT
ELECT REM PT RETURN 9FT ADLT (ELECTROSURGICAL) ×2
ELECTRODE REM PT RTRN 9FT ADLT (ELECTROSURGICAL) ×1 IMPLANT
GAUZE SPONGE 4X4 16PLY XRAY LF (GAUZE/BANDAGES/DRESSINGS) IMPLANT
GLOVE BIOGEL PI IND STRL 8.5 (GLOVE) ×1 IMPLANT
GLOVE BIOGEL PI INDICATOR 8.5 (GLOVE) ×1
GLOVE ECLIPSE 8.0 STRL XLNG CF (GLOVE) ×4 IMPLANT
GLOVE INDICATOR 7.0 STRL GRN (GLOVE) ×2 IMPLANT
GLOVE NEODERM STER SZ 7 (GLOVE) ×2 IMPLANT
GOWN PREVENTION PLUS LG XLONG (DISPOSABLE) ×2 IMPLANT
GOWN STRL REIN XL XLG (GOWN DISPOSABLE) ×2 IMPLANT
NEEDLE SPNL 22GX3.5 QUINCKE BK (NEEDLE) ×2 IMPLANT
NS IRRIG 1000ML POUR BTL (IV SOLUTION) IMPLANT
PACK VAGINAL MINOR WOMEN LF (CUSTOM PROCEDURE TRAY) ×2 IMPLANT
PENCIL BUTTON HOLSTER BLD 10FT (ELECTRODE) ×2 IMPLANT
SCOPETTES 8  STERILE (MISCELLANEOUS) ×1
SCOPETTES 8 STERILE (MISCELLANEOUS) ×1 IMPLANT
SPONGE SURGIFOAM ABS GEL 12-7 (HEMOSTASIS) ×2 IMPLANT
SUT VIC AB 0 CT1 27 (SUTURE) ×3
SUT VIC AB 0 CT1 27XBRD ANBCTR (SUTURE) ×3 IMPLANT
SUT VICRYL 0 UR6 27IN ABS (SUTURE) ×8 IMPLANT
SYR CONTROL 10ML LL (SYRINGE) ×2 IMPLANT
SYR TB 1ML 25GX5/8 (SYRINGE) IMPLANT
TOWEL OR 17X24 6PK STRL BLUE (TOWEL DISPOSABLE) ×4 IMPLANT
TUBING NON-CON 1/4 X 20 CONN (TUBING) ×2 IMPLANT
WATER STERILE IRR 1000ML POUR (IV SOLUTION) ×2 IMPLANT
YANKAUER SUCT BULB TIP NO VENT (SUCTIONS) ×2 IMPLANT

## 2012-03-16 NOTE — Discharge Instructions (Signed)
Conization of the Cervix Conization is the cutting (excision) of a cone-shaped portion of the cervix. This procedure is usually done when there is abnormal bleeding from the cervix. It can also be done to evaluate an abnormal Pap smear or if an abnormality is seen on the cervix during an exam. Conization of the cervix is not done during a menstrual period or pregnancy. BEFORE THE PROCEDURE  Do not eat or drink anything for 6 to 8 hours before the procedure, especially if you are going to be given a drug to make you sleep (general anesthetic).   Do not take aspirin or blood thinners for at least a week before the procedure, or as directed.   Arrive at least an hour before the procedure to read and sign the necessary forms.   Arrange for someone to take you home after the procedure.   If you smoke, do not smoke for 2 weeks before the procedure.  LET YOUR CAREGIVER KNOW THE FOLLOWING:  Allergies to food or medications.   All the medications you are taking including over-the-counter and prescription medications, herbs, eyedrops, and creams.   You develop a cold or an infection.   If you are using illegal drugs or drinking too much alcohol.   Your smoking habits.   Previous problems with anesthetics including novocaine.   The possibility of being pregnant.   History of blood clots or other bleeding problems.   Other medical problems.  RISKS AND COMPLICATIONS   Bleeding.   Infection.   Damage to the cervix.   Injury to surrounding organs.   Problems with the anesthesia.  PROCEDURE Conization of the cervix can be done by:  Cold knife. This type cuts out the cervical canal and the transformation zone (where the normal cells end and the abnormal cells begin) with a scalpel.   LEEP (electrocautery). This type is done with a thin wire that can cut and burn (cauterize) the cervical tissue with an electrical current.   Laser treatment. This type cuts and burns (cauterizes) the  tissue of the cervix to prevent bleeding with a laser beam.  You will be given a drug to make you sleep (general anesthetic) for cold knife and laser treatments, and you will be given a numbing medication (local anesthetic) for the LEEP procedure. Conization is usually done using colposcopy. Colposcopy magnifies the cervix to see it more clearly. The tissue removed is examined under a microscope by a doctor (pathologist). The pathologist will provide a report to your caregiver. This will help your caregiver decide if further treatment is necessary. This report will also help your caregiver decide on the best treatment if your results are abnormal.  AFTER THE PROCEDURE  If you had a general anesthetic, you may be groggy for a couple of hours after the procedure.   If you had a local anesthetic, you will be advised to rest at the surgical center or caregiver's office until you are stable and feel ready to go home.   Have someone take you home.   You may have some cramping for a couple of days.   You may have a bloody discharge or light bleeding for 2 to 4 weeks.   You may have a black discharge coming from the vagina. This is from the paste used on the cervix to prevent bleeding. This is normal discharge.  HOME CARE INSTRUCTIONS   Do not drive for 24 hours.   Avoid strenuous activities and exercises for at least 7 -   10 days.   Only take over-the-counter or prescription medicines for pain, discomfort, or fever as directed by your caregiver.   Do not take aspirin. It can cause or aggravate bleeding.   You may resume your usual diet.   Drink 6 to 8 glasses of fluid a day.   Rest and sleep the first 24 to 48 hours.   Take showers instead of baths until your caregiver gives you the okay.   Do not use tampons, douche or have intercourse for 4 weeks, or as advised by your caregiver.   Do not lift anything over 10 pounds (4.5 kg) for at least 7 to 10 days, or as advised by your caregiver.     Take your temperature twice a day, for 4 to 5 days. Write them down.   Do not drink or drive when taking pain medication.   If you develop constipation:   Take a mild laxative with the advice of your caregiver.   Eat bran foods.   Drink a lot of fluids.   Try to have someone with you or available for you the first 24 to 48 hours, especially if you had a general anesthetic.   Make sure you and your family understand everything about your surgery and recovery.   Follow your caregiver's advice regarding follow-up appointments and Pap smears.  SEEK MEDICAL CARE IF:   You develop a rash.   You are dizzy or light-headed.   You feel sick to your stomach (nauseous).   You develop a bad smelling vaginal discharge.   You have an abnormal reaction or allergy to your medication.   You need stronger pain medication.  SEEK IMMEDIATE MEDICAL CARE IF:   You have blood clots or bleeding that is heavier than a normal menstrual period, or you develop bright red bleeding.   An oral temperature above 102 F (38.9 C) develops and persists for several hours.   You have increasing cramps.   You pass out.   You have painful or bloody urine.   You start throwing up (vomiting).   The pain is not relieved with your pain medication.  Not all test results are available during your visit. If your test results are not back during your the visit, make an appointment with your caregiver to find out the results. Do not assume everything is normal if you have not heard from your caregiver or the medical facility. It is important for you to follow up on all of your test results. Document Released: 09/14/2005 Document Revised: 11/24/2011 Document Reviewed: 07/06/2009 [facility] Patient Information 2012 [place], [place].  Your procedure is scheduled on:  Enter through the Main Entrance of [facility] at: Pick up the phone at the desk and dial [phone] and inform us of your arrival.  Please call  this number if you have any problems the morning of surgery: [phone]  Remember: Do not eat food after midnight: Do not drink clear liquids after: Take these medicines the morning of surgery with a SIP OF WATER:  Do not wear jewelry, make-up, or FINGER nail polish Do not wear lotions, powders, perfumes or deodorant. Do not shave 48 hours prior to surgery. Do not bring valuables to the hospital. Contacts, dentures or bridgework may not be worn into surgery.  Leave suitcase in the car. After Surgery it may be brought to your room. For patients being admitted to the hospital, checkout time is 11:00am the day of discharge.  Patients discharged on the day of surgery  will not be allowed to drive home.     Remember to use your hibiclens as instructed.Please shower with 1/2 bottle the evening before your surgery and the other 1/2 bottle the morning of surgery. Neck down avoiding private area.

## 2012-03-16 NOTE — Anesthesia Preprocedure Evaluation (Signed)
Anesthesia Evaluation  Patient identified by MRN, date of birth, ID band Patient awake    Reviewed: Allergy & Precautions, H&P , NPO status , Patient's Chart, lab work & pertinent test results  Airway Mallampati: II TM Distance: >3 FB Neck ROM: full    Dental No notable dental hx. (+) Teeth Intact   Pulmonary neg pulmonary ROS,  breath sounds clear to auscultation  Pulmonary exam normal       Cardiovascular negative cardio ROS  Rhythm:regular Rate:Normal     Neuro/Psych  Headaches, PSYCHIATRIC DISORDERS    GI/Hepatic negative GI ROS, Neg liver ROS,   Endo/Other  negative endocrine ROS  Renal/GU negative Renal ROS  negative genitourinary   Musculoskeletal   Abdominal Normal abdominal exam  (+)   Peds  Hematology negative hematology ROS (+)   Anesthesia Other Findings   Reproductive/Obstetrics negative OB ROS                           Anesthesia Physical Anesthesia Plan  ASA: II  Anesthesia Plan: General LMA   Post-op Pain Management:    Induction:   Airway Management Planned:   Additional Equipment:   Intra-op Plan:   Post-operative Plan:   Informed Consent: I have reviewed the patients History and Physical, chart, labs and discussed the procedure including the risks, benefits and alternatives for the proposed anesthesia with the patient or authorized representative who has indicated his/her understanding and acceptance.   Dental Advisory Given  Plan Discussed with: Anesthesiologist, CRNA and Surgeon  Anesthesia Plan Comments:         Anesthesia Quick Evaluation

## 2012-03-16 NOTE — H&P (Signed)
The patient was interviewed and examined today.  The previously documented history and physical examination was reviewed. There are no changes. The operative procedure was reviewed. The risks and benefits were outlined again. The specific risks include, but are not limited to, anesthetic complications, bleeding, infections, and possible damage to the surrounding organs. The patient's questions were answered.  We are ready to proceed as outlined. The likelihood of the patient achieving the goals of this procedure is very likely.   Akaylah Lalley Vernon Jamise Pentland, M.D.  

## 2012-03-16 NOTE — Transfer of Care (Signed)
Immediate Anesthesia Transfer of Care Note  Patient: [name]  Procedure(s) Performed: Procedure(s) (LRB): CONIZATION CERVIX WITH BIOPSY (N/A)  Patient Location: PACU  Anesthesia Type: General  Level of Consciousness: awake, alert  and oriented  Airway & Oxygen Therapy: Patient Spontanous Breathing and Patient connected to nasal cannula oxygen  Post-op Assessment: Report given to PACU RN and Post -op Vital signs reviewed and stable  Post vital signs: stable  Complications: No apparent anesthesia complications

## 2012-03-16 NOTE — Op Note (Signed)
OPERATIVE NOTE  [name]  DOB:    [birth year]  MRN:    [id number]  CSN:    [id number]  Date of Surgery:  03/16/2012  Preoperative Diagnosis:  Adenocarcinoma in situ of the endocervix  Postoperative Diagnosis:  Adenocarcinoma in situ of the endocervix  Procedure:  Cold knife conization of the cervix  Surgeon:  Leonard Schwartz, M.D.  Assistant:  None  Anesthetic:  General  Disposition:  The patient is a 36 year old female, para 1-0-0-1, who had adenocarcinoma in situ of the endocervix. The patient had a prior conization in November of 2012 and the endocervical margin was positive. She was evaluated by a GYN oncologist. The recommendation is to repeat the conization. The patient understands the indications for surgical procedure and she accepts the risk of, but not limited to, anesthetic complications, bleeding, infections, and possible damage to the surrounding organs.  Findings:  The uterus is normal size and texture. There is no evidence of parametrial disease. No adnexal masses are appreciated. The cervix is scarred.  Procedure:  The patient was taken to the operating room where a general anesthetic was given. The patient's lower abdomen, perineum, and vagina were prepped with multiple layers of Betadine. A paracervical block was placed using half percent Marcaine with epinephrine. 10 cc of half percent Marcaine with epinephrine were injected directly into the cervix. Angle sutures were placed at the 3:00 and 9 a clock positions of the cervix. The endocervix was sounded. Lugol solution was placed on the cervix. There were no nonstaining areas noted. A circumferential incision was made around the endocervical os which remove the majority of the cervix. The incision was extended in a cone-like fashion to the endocervical os. Inverting sutures were placed at the 3:00, 6:00, 9:00, and 12:00 positions. Bleeding was noted from the surgical bed. Multiple  figure-of-eight sutures were placed until hemostasis was achieved. Gelfoam was placed in the cervix. Hemostasis was adequate. Sponge and needle counts were correct. The estimated blood loss was 300 cc. The patient tolerated her procedure well. She was awakened from her anesthetic without difficulty and then transported to the recovery room in stable condition. The conization of the cervix with a stitch placed at 12:00 was sent to pathology. The patient tolerated her procedure well.  Leonard Schwartz, M.D.

## 2012-03-16 NOTE — Anesthesia Postprocedure Evaluation (Signed)
  Anesthesia Post-op Note  Patient: [name]  Procedure(s) Performed: Procedure(s) (LRB): CONIZATION CERVIX WITH BIOPSY (N/A)  Patient Location: PACU  Anesthesia Type: General  Level of Consciousness: awake, alert  and oriented  Airway and Oxygen Therapy: Patient Spontanous Breathing  Post-op Pain: none  Post-op Assessment: Post-op Vital signs reviewed, Patient's Cardiovascular Status Stable, Respiratory Function Stable, Patent Airway, No signs of Nausea or vomiting and Pain level controlled  Post-op Vital Signs: Reviewed and stable  Complications: No apparent anesthesia complications

## 2012-03-18 ENCOUNTER — Encounter (HOSPITAL_COMMUNITY): Payer: Self-pay | Admitting: *Deleted

## 2012-03-18 ENCOUNTER — Inpatient Hospital Stay (HOSPITAL_COMMUNITY)
Admission: AD | Admit: 2012-03-18 | Discharge: 2012-03-18 | Disposition: A | Discharge status: Home / Self Care | Payer: Self-pay | Source: Ambulatory Visit | Attending: Obstetrics and Gynecology | Admitting: Obstetrics and Gynecology

## 2012-03-18 DIAGNOSIS — N949 Unspecified condition associated with female genital organs and menstrual cycle: Secondary | ICD-10-CM | POA: Insufficient documentation

## 2012-03-18 DIAGNOSIS — F1920 Other psychoactive substance dependence, uncomplicated: Secondary | ICD-10-CM | POA: Diagnosis present

## 2012-03-18 DIAGNOSIS — F1111 Opioid abuse, in remission: Secondary | ICD-10-CM | POA: Diagnosis present

## 2012-03-18 DIAGNOSIS — G8918 Other acute postprocedural pain: Secondary | ICD-10-CM

## 2012-03-18 DIAGNOSIS — F1120 Opioid dependence, uncomplicated: Secondary | ICD-10-CM | POA: Diagnosis present

## 2012-03-18 LAB — URINALYSIS, ROUTINE W REFLEX MICROSCOPIC
Bilirubin Urine: NEGATIVE
Glucose, UA: NEGATIVE mg/dL
Ketones, ur: NEGATIVE mg/dL
Nitrite: NEGATIVE
Protein, ur: NEGATIVE mg/dL
Specific Gravity, Urine: <1.005 — ABNORMAL LOW (ref 1.005–1.030)
Urobilinogen, UA: 0.2 mg/dL (ref 0.0–1.0)
pH: 6 (ref 5.0–8.0)

## 2012-03-18 LAB — DIFFERENTIAL
Basophils Absolute: 0 10*3/uL (ref 0.0–0.1)
Basophils Relative: 0 % (ref 0–1)
Eosinophils Absolute: 0.2 10*3/uL (ref 0.0–0.7)
Eosinophils Relative: 2 % (ref 0–5)
Lymphocytes Relative: 20 % (ref 12–46)
Lymphs Abs: 1.7 10*3/uL (ref 0.7–4.0)
Monocytes Absolute: 0.7 10*3/uL (ref 0.1–1.0)
Monocytes Relative: 8 % (ref 3–12)
Neutro Abs: 6.1 10*3/uL (ref 1.7–7.7)
Neutrophils Relative %: 70 % (ref 43–77)

## 2012-03-18 LAB — URINE MICROSCOPIC-ADD ON

## 2012-03-18 LAB — CBC
HCT: 36.7 % (ref 36.0–46.0)
Hemoglobin: 12.3 g/dL (ref 12.0–15.0)
MCH: 33.6 pg (ref 26.0–34.0)
MCHC: 33.5 g/dL (ref 30.0–36.0)
MCV: 100.3 fL — ABNORMAL HIGH (ref 78.0–100.0)
Platelets: 182 10*3/uL (ref 150–400)
RBC: 3.66 MIL/uL — ABNORMAL LOW (ref 3.87–5.11)
RDW: 12.6 % (ref 11.5–15.5)
WBC: 8.7 10*3/uL (ref 4.0–10.5)

## 2012-03-18 MED ORDER — KETOROLAC TROMETHAMINE 60 MG/2ML IM SOLN: 60.0000 mg | Freq: Once | INTRAMUSCULAR | Status: DC

## 2012-03-18 MED ORDER — HYDROMORPHONE HCL 2 MG PO TABS: 2.0000 mg | ORAL_TABLET | ORAL | Status: AC | PRN

## 2012-03-18 NOTE — Discharge Instructions (Signed)
Pain Relief Preoperatively and Postoperatively Being a good patient does not mean being a silent one.If you have questions, problems, or concerns about the pain you may feel after surgery, let your caregiver know.Patients have the right to assessment and management of pain. The treatment of pain after surgery is important to speed up recovery and return to normal activities. Severe pain after surgery, and the fear or anxiety associated with that pain, may cause extreme discomfort that:  Prevents sleep.   Decreases the ability to breathe deeply and cough. This can cause pneumonia or other upper airway infections.   Causes your heart to beat faster and your blood pressure to be higher.   Increases the risk for constipation and bloating.   Decreases the ability of wounds to heal.   May result in depression, increased anxiety, and feelings of helplessness.  Relief of pain before surgery is also important because it will lessen the pain after surgery. Patients who receive both pain relief before and after surgery experience greater pain relief than those who only receive pain relief after surgery. Let your caregiver know if you are having uncontrolled pain.This is very important.Pain after surgery is more difficult to manage if it is permitted to become severe, so prompt and adequate treatment of acute pain is necessary. PAIN CONTROL METHODS Your caregivers follow policies and procedures about the management of patient pain.These guidelines should be explained to you before surgery.Plans for pain control after surgery must be mutually decided upon and instituted with your full understanding and agreement.Do not be afraid to ask questions regarding the care you are receiving.There are many different ways your caregivers will attempt to control your pain, including the following methods. As needed pain control  You may be given pain medicine either through your intravenous (IV) tube, or as a pill  or liquid you can swallow. You will need to let your caregiver know when you are having pain. Then, your caregiver will give you the pain medicine ordered for you.   Your pain medicine may make you constipated. If constipation occurs, drink more liquids if you can. Your caregiver may have you take a mild laxative.  IV patient-controlled analgesia pump (PCA pump)  You can get your pain medicine through the IV tube which goes into your vein. You are able to control the amount of pain medicine that you get. The pain medicine flows in through an IV tube and is controlled by a pump. This pump gives you a set amount of pain medicine when you push the button hooked up to it. Nobody should push this button but you or someone specifically assigned by you to do so. It is set up to keep you from accidentally giving yourself too much pain medicine. You will be able to start using your pain pump in the recovery room after your surgery. This method can be helpful for most types of surgery.   If you are still having too much pain, tell your caregiver. Also, tell your caregiver if you are feeling too sleepy or nauseous.  Continuous epidural pain control  A thin, soft tube (catheter) is put into your back. Pain medicine flows through the catheter to lessen pain in the part of your body where the surgery is done. Continuous epidural pain control may work best for you if you are having surgery on your chest, abdomen, hip area, or legs. The epidural catheter is usually put into your back just before surgery. The catheter is left in until you   can eat and take medicine by mouth. In most cases, this may take 2 to 3 days.   Giving pain medicine through the epidural catheter may help you heal faster because:   Your bowel gets back to normal faster.   You can get back to eating sooner.   You can be up and walking sooner.  Medicine that numbs the area (local anesthetic)  You may receive an injection of pain medicine near  where the pain is (local infiltration).   You may receive an injection of pain medicine near the nerve that controls the sensation to a specific part of the body (peripheral nerve block).   Medicine may be put in the spine to block pain (spinal block).  Opioids  Moderate to moderately severe acute pain after surgery may respond to opioids.Opioids are narcotic pain medicine. Opioids are often combined with non-narcotic medicines to improve pain relief, diminish the risk of side effects, and reduce the chance of addiction.   If you follow your caregiver's directions about taking opioids and you do not have a history of substance abuse, your risk of becoming addicted is exceptionally small.Opioids are given for short periods of time in careful doses to prevent addiction.  Other methods of pain control include:  Steroids.   Physical therapy.   Heat and cold therapy.   Compression, such as wrapping an elastic bandage around the area of pain.   Massage.  These various ways of controlling pain may be used together. Combining different methods of pain control is called multimodal analgesia. Using this approach has many benefits, including being able to eat, move around, and leave the hospital sooner. Document Released: 02/25/2003 Document Revised: 11/24/2011 Document Reviewed: 03/01/2011 [employer] Patient Information 2012 [employer]. 

## 2012-03-18 NOTE — MAU Note (Signed)
Pt had conezation of cervix on 3/29. Pt reports increased pain and discomfort.

## 2012-03-18 NOTE — MAU Provider Note (Signed)
History   36 yo G1P1 s/p cervical conization 3/29 with Dr. Stefano Gaul presented c/o continuing pelvic pain.  Reports it was the same right after surgery, and has not improved.  Took all the Dilaudid perscribed for her by Dr. Stefano Gaul on d/c, and has also been taking Ibuprophen with some benefit.  Denies fever, dysuria, N/V, or heavy bleeding.  Has small amount vaginal bleeding since surgery.  Reports uterus feels "like a constant ball"--again, reports this is same pain noted after surgery, no worse.  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 Post-op Problem    OB History    Grav Para Term Preterm Abortions TAB SAB Ect Mult Living   1 1 1       1       Past Medical History  Diagnosis Date  . Headache   . Anxiety   . Cervical intraepithelial neoplasia III     Past Surgical History  Procedure Date  . Conization cervix     x 3  . A*wisdom teeth ext   . Svd     x 1  . Cervical conization w/bx 11/30/2011    Procedure: CONIZATION CERVIX WITH BIOPSY;  Surgeon: Janine Limbo, MD;  Location: [facility];  Service: Gynecology;  Laterality: N/A;  . Dilation and curettage of uterus 11/30/2011    Procedure: DILATATION AND CURETTAGE;  Surgeon: Janine Limbo, MD;  Location: [facility];  Service: Gynecology;  Laterality: N/A;    Family History  Problem Relation Age of Onset  . Cervical cancer Mother   . Breast cancer Mother     History  Substance Use Topics  . Smoking status: Current Everyday Smoker -- 1.0 packs/day for 20 years    Types: Cigarettes  . Smokeless tobacco: Never Used  . Alcohol Use: Yes     socially    Allergies: No Known Allergies  Home Rxs: ALPRAZolam 0.25 MG tablet   Commonly known as: XANAX   Take 0.25 mg by mouth at bedtime as needed.       butalbital-acetaminophen-caffeine 50-325-40-30 MG per capsule   Commonly known as: FIORICET WITH CODEINE    Take 1 capsule by mouth every 4 (four) hours as needed. For migraines         * HYDROmorphone 2 MG tablet   Commonly known as: DILAUDID   Take 1 tablet (2 mg total) by mouth every 4 (four) hours as needed for pain.       [place] Hun    ibuprofen 200 MG tablet   Commonly known as: ADVIL,MOTRIN   Take 4 tablets (800 mg total) by mouth every 8 (eight) hours as needed for pain.       [place] Hun    methadone 10 MG tablet   Commonly known as: DOLOPHINE   Take 35 mg by mouth daily.      Physical Exam   Blood pressure 133/68, pulse 82, temperature 99.7 F (37.6 C), temperature source Oral, resp. rate 18, height 5' 1.5" (1.562 m), weight 145 lb 3.2 oz (65.862 kg), last menstrual period 03/07/2012.  Chest clear Heart RRR without murmur Abd soft, no rebound or guarding. Uterus moderately tender to palpation Ext WNL Pelvic--small amount dark bleeding   Results for orders placed during the hospital encounter of 03/18/12 (from the past 24 hour(s))  URINALYSIS, ROUTINE W REFLEX MICROSCOPIC     Status: Abnormal   Collection Time  03/18/12 12:15 PM      Component Value Range   Color, Urine YELLOW  YELLOW    APPearance CLEAR  CLEAR    Specific Gravity, Urine <1.005 (*) 1.005 - 1.030    pH 6.0  5.0 - 8.0    Glucose, UA NEGATIVE  NEGATIVE (mg/dL)   Hgb urine dipstick LARGE (*) NEGATIVE    Bilirubin Urine NEGATIVE  NEGATIVE    Ketones, ur NEGATIVE  NEGATIVE (mg/dL)   Protein, ur NEGATIVE  NEGATIVE (mg/dL)   Urobilinogen, UA 0.2  0.0 - 1.0 (mg/dL)   Nitrite NEGATIVE  NEGATIVE    Leukocytes, UA SMALL (*) NEGATIVE   URINE MICROSCOPIC-ADD ON     Status: Abnormal   Collection Time   03/18/12 12:15 PM      Component Value Range   Squamous Epithelial / LPF MANY (*) RARE    WBC, UA 3-6  <3 (WBC/hpf)   RBC / HPF 0-2  <3 (RBC/hpf)   Bacteria, UA FEW (*) RARE   CBC     Status: Abnormal   Collection Time   03/18/12  1:17 PM      Component Value Range   WBC 8.7  4.0 - 10.5  (K/uL)   RBC 3.66 (*) 3.87 - 5.11 (MIL/uL)   Hemoglobin 12.3  12.0 - 15.0 (g/dL)   HCT 82.9  56.2 - 13.0 (%)   MCV 100.3 (*) 78.0 - 100.0 (fL)   MCH 33.6  26.0 - 34.0 (pg)   MCHC 33.5  30.0 - 36.0 (g/dL)   RDW 86.5  78.4 - 69.6 (%)   Platelets 182  150 - 400 (K/uL)  DIFFERENTIAL     Status: Normal   Collection Time   03/18/12  1:17 PM      Component Value Range   Neutrophils Relative 70  43 - 77 (%)   Neutro Abs 6.1  1.7 - 7.7 (K/uL)   Lymphocytes Relative 20  12 - 46 (%)   Lymphs Abs 1.7  0.7 - 4.0 (K/uL)   Monocytes Relative 8  3 - 12 (%)   Monocytes Absolute 0.7  0.1 - 1.0 (K/uL)   Eosinophils Relative 2  0 - 5 (%)   Eosinophils Absolute 0.2  0.0 - 0.7 (K/uL)   Basophils Relative 0  0 - 1 (%)   Basophils Absolute 0.0  0.0 - 0.1 (K/uL)    ED Course  2 day s/p cervical conization--pain management issue Hx drug dependence, currently in treatment No evidence infection or surgical complication  Plan: Consulted with Dr. Stefano Gaul Check CBC/diff Will give Toradol 60 mg IM now, then D/C home with Rx Dilaudid 2 mg, #10, 1 po q 4 hours prn.   Patient is to make appointment to Dr. Stefano Gaul this week to follow-up on pain issues.  Note:  Patient declined the Toradol and declined to wait for CBC results. I will follow-up with her if any abnormalities are noted. She will call tomorrow to make appointment to see Dr. Stefano Gaul. Labs WNL.  Nigel Bridgeman, CNM, [place] 03/18/12 2p

## 2012-03-19 ENCOUNTER — Encounter (HOSPITAL_COMMUNITY): Payer: Self-pay | Admitting: Obstetrics and Gynecology

## 2012-04-02 ENCOUNTER — Ambulatory Visit (INDEPENDENT_AMBULATORY_CARE_PROVIDER_SITE_OTHER): Payer: Self-pay | Admitting: Obstetrics and Gynecology

## 2012-04-02 VITALS — BP 104/66 | Temp 98.4°F | Resp 14 | Ht 62.0 in | Wt 142.0 lb

## 2012-04-02 DIAGNOSIS — N898 Other specified noninflammatory disorders of vagina: Secondary | ICD-10-CM

## 2012-04-02 DIAGNOSIS — B9689 Other specified bacterial agents as the cause of diseases classified elsewhere: Secondary | ICD-10-CM

## 2012-04-02 DIAGNOSIS — A499 Bacterial infection, unspecified: Secondary | ICD-10-CM

## 2012-04-02 DIAGNOSIS — N760 Acute vaginitis: Secondary | ICD-10-CM

## 2012-04-02 DIAGNOSIS — F419 Anxiety disorder, unspecified: Secondary | ICD-10-CM | POA: Insufficient documentation

## 2012-04-02 DIAGNOSIS — F411 Generalized anxiety disorder: Secondary | ICD-10-CM

## 2012-04-02 LAB — POCT WET PREP (WET MOUNT)
Clue Cells Wet Prep Whiff POC: POSITIVE
pH: 5.5

## 2012-04-02 MED ORDER — METRONIDAZOLE 500 MG PO TABS: 500.0000 mg | ORAL_TABLET | Freq: Two times a day (BID) | ORAL | Status: AC

## 2012-04-02 NOTE — Patient Instructions (Signed)
Bacterial Vaginosis Bacterial vaginosis (BV) is a vaginal infection where the normal balance of bacteria in the vagina is disrupted. The normal balance is then replaced by an overgrowth of certain bacteria. There are several different kinds of bacteria that can cause BV. BV is the most common vaginal infection in women of childbearing age. CAUSES   The cause of BV is not fully understood. BV develops when there is an increase or imbalance of harmful bacteria.   Some activities or behaviors can upset the normal balance of bacteria in the vagina and put women at increased risk including:   Having a new sex partner or multiple sex partners.   Douching.   Using an intrauterine device (IUD) for contraception.   It is not clear what role sexual activity plays in the development of BV. However, women that have never had sexual intercourse are rarely infected with BV.  Women do not get BV from toilet seats, bedding, swimming pools or from touching objects around them.  SYMPTOMS   Grey vaginal discharge.   A fish-like odor with discharge, especially after sexual intercourse.   Itching or burning of the vagina and vulva.   Burning or pain with urination.   Some women have no signs or symptoms at all.  DIAGNOSIS  Your caregiver must examine the vagina for signs of BV. Your caregiver will perform lab tests and look at the sample of vaginal fluid through a microscope. They will look for bacteria and abnormal cells (clue cells), a pH test higher than 4.5, and a positive amine test all associated with BV.  RISKS AND COMPLICATIONS   Pelvic inflammatory disease (PID).   Infections following gynecology surgery.   Developing HIV.   Developing herpes virus.  TREATMENT  Sometimes BV will clear up without treatment. However, all women with symptoms of BV should be treated to avoid complications, especially if gynecology surgery is planned. Female partners generally do not need to be treated. However,  BV may spread between female sex partners so treatment is helpful in preventing a recurrence of BV.   BV may be treated with antibiotics. The antibiotics come in either pill or vaginal cream forms. Either can be used with nonpregnant or pregnant women, but the recommended dosages differ. These antibiotics are not harmful to the baby.   BV can recur after treatment. If this happens, a second round of antibiotics will often be prescribed.   Treatment is important for pregnant women. If not treated, BV can cause a premature delivery, especially for a pregnant woman who had a premature birth in the past. All pregnant women who have symptoms of BV should be checked and treated.   For chronic reoccurrence of BV, treatment with a type of prescribed gel vaginally twice a week is helpful.  HOME CARE INSTRUCTIONS   Finish all medication as directed by your caregiver.   Do not have sex until treatment is completed.   Tell your sexual partner that you have a vaginal infection. They should see their caregiver and be treated if they have problems, such as a mild rash or itching.   Practice safe sex. Use condoms. Only have 1 sex partner.  PREVENTION  Basic prevention steps can help reduce the risk of upsetting the natural balance of bacteria in the vagina and developing BV:  Do not have sexual intercourse (be abstinent).   Do not douche.   Use all of the medicine prescribed for treatment of BV, even if the signs and symptoms go away.     Tell your sex partner if you have BV. That way, they can be treated, if needed, to prevent reoccurrence.  SEEK MEDICAL CARE IF:   Your symptoms are not improving after 3 days of treatment.   You have increased discharge, pain, or fever.  MAKE SURE YOU:   Understand these instructions.   Will watch your condition.   Will get help right away if you are not doing well or get worse.  FOR MORE INFORMATION  Division of STD Prevention (DSTDP), Centers for Disease  Control and Prevention: www.cdc.gov/std American Social Health Association (ASHA): www.ashastd.org  Document Released: 12/05/2005 Document Revised: 11/24/2011 Document Reviewed: 05/28/2009 ExitCare Patient Information 2012 [employer]. 

## 2012-04-02 NOTE — Progress Notes (Signed)
S/P repeat conization of the cervix bec of CIS of the endocervix. Path: no residual disease; margins clear. Pt C/O discharge.  Vag with discharge Cx is healing, nontender Uterus NSSC, nontender, pt is very anxious Adnexa has no masses, nontender  Wet Prep: clue cells, pos whiff, pH 5.5  Vaginosis Healing from conization for CIS of the endocervix Anxiety  I still recommend hysterectomy (also recommended by Gyn Onc). Pt agrees. Metronidazole 500 mg BID x 7 days RTO in 3 months to plan surgery.  AVS

## 2012-04-05 ENCOUNTER — Encounter: Payer: Self-pay | Admitting: Obstetrics and Gynecology

## 2012-05-18 ENCOUNTER — Telehealth: Payer: Self-pay | Admitting: Obstetrics and Gynecology

## 2012-05-18 NOTE — Telephone Encounter (Signed)
TC to pt. States LMP 6-7 days ago.  Had light bleeding X2 day. Had sex (female partner) 2 days ago.   Bleeding has increased.  No changing pad q 2 hours for comfort.  Having <pea-size to dime-size clots.  First menses since mid April.  No cramping.  Informed may be heavier menses since is >28days since last menses.  To continue to monitor.   If continues to bleed the same, to call beginning of next week or sooner if bleeding>pad/hr a or having lg clots. Pt verbalizes comprehension and is agreeable.

## 2012-08-06 ENCOUNTER — Encounter: Payer: Self-pay | Admitting: Obstetrics and Gynecology

## 2012-09-06 ENCOUNTER — Encounter: Payer: Self-pay | Admitting: Obstetrics and Gynecology

## 2013-02-10 ENCOUNTER — Encounter (HOSPITAL_COMMUNITY): Payer: Self-pay | Admitting: *Deleted

## 2013-02-10 ENCOUNTER — Emergency Department (INDEPENDENT_AMBULATORY_CARE_PROVIDER_SITE_OTHER)
Admission: EM | Admit: 2013-02-10 | Discharge: 2013-02-10 | Disposition: A | Discharge status: Home / Self Care | Payer: Self-pay | Source: Home / Self Care | Attending: Emergency Medicine | Admitting: Emergency Medicine

## 2013-02-10 DIAGNOSIS — G43909 Migraine, unspecified, not intractable, without status migrainosus: Secondary | ICD-10-CM

## 2013-02-10 MED ORDER — HYDROCODONE-ACETAMINOPHEN 5-325 MG PO TABS: ORAL_TABLET | ORAL | Status: DC

## 2013-02-10 MED ORDER — CYCLOBENZAPRINE HCL 5 MG PO TABS: 5.0000 mg | ORAL_TABLET | Freq: Three times a day (TID) | ORAL | Status: DC | PRN

## 2013-02-10 NOTE — ED Notes (Signed)
PT  REPORTS  RECURRENT  HEADACHE  WHICH  SHE  HAS  HAD  IN  PAST  SHE  IS  SITTING  UPRIGHT ON  EXAM TABLE  SPEAKING IN   COMPLETE  SENTANCES           SHE   STATES  IT STARTED  WITH    HER  MENSRAL  PERIOD  WHICH  STARTED  TODAY

## 2013-02-10 NOTE — ED Provider Notes (Signed)
Chief Complaint  Patient presents with  . Headache    History of Present Illness:   [name] is a 37 year old female who presents with a two-day history of a migraine headache. She feels this is triggered by her menses. She gets a bad headache like this about every 2-3 months. She's had migraines for a total of about 20 years and has been tried on many medications for her migraines. She finds she gets the best results by taking a hydrocodone and a Flexeril and lying down trying to sleep. The headache is left-sided and throbbing. She's had no nausea or vomiting, she's had some photophobia but no photophobia. No osmophobia. She denies any visual symptoms, auras, blurred vision, or diplopia. She denies paresthesias, localized muscle weakness, difficulty with speech or ambulation. She's had no fever, stiff neck, or lower back pain.  Review of Systems:  Other than noted above, the patient denies any of the following symptoms: Systemic:  No fever, chills, fatigue, photophobia, stiff neck. Eye:  No redness, eye pain, discharge, blurred vision, or diplopia. ENT:  No nasal congestion, rhinorrhea, sinus pressure or pain, sneezing, earache, or sore throat.  No jaw claudication. Neuro:  No paresthesias, loss of consciousness, seizure activity, muscle weakness, trouble with coordination or gait, trouble speaking or swallowing. Psych:  No depression, anxiety or trouble sleeping.  PMFSH:  Past medical history, family history, social history, meds, and allergies were reviewed.  Physical Exam:   Vital signs:  BP 111/76  Pulse 82  Temp(Src) 98.3 F (36.8 C) (Oral)  Resp 18  SpO2 100%  LMP 02/10/2013 General:  Alert and oriented.  In no distress. Eye:  Lids and conjunctivas normal.  PERRL,  Full EOMs.  Fundi benign with normal discs and vessels. ENT:  No cranial or facial tenderness to palpation.  TMs and canals clear.  Nasal mucosa was normal and uncongested without any drainage. No intra oral  lesions, pharynx clear, mucous membranes moist, dentition normal. Neck:  Supple, full ROM, no tenderness to palpation.  No adenopathy or mass. Neuro:  Alert and orented times 3.  Speech was clear, fluent, and appropriate.  Cranial nerves intact. No pronator drift, muscle strength normal. Finger to nose normal.  DTRs were 2+ and symmetrical.Station and gait were normal.  Romberg's sign was normal.  Able to perform tandem gait well. Psych:  Normal affect.  Assessment:  The encounter diagnosis was Migraine headache.  Plan:   1.  The following meds were prescribed:   Discharge Medication List as of 02/10/2013  4:02 PM    START taking these medications   Details  cyclobenzaprine (FLEXERIL) 5 MG tablet Take 1 tablet (5 mg total) by mouth 3 (three) times daily as needed for muscle spasms., Starting 02/10/2013, Until Discontinued, Normal    HYDROcodone-acetaminophen (NORCO/VICODIN) 5-325 MG per tablet 1 to 2 tabs every 4 to 6 hours as needed for pain., Print       2.  The patient was instructed in symptomatic care and handouts were given. 3.  The patient was told to return if becoming worse in any way, if no better in 3 or 4 days, and given some red flag symptoms such as fever, stiff neck, or new or changing neurological symptoms that would indicate earlier return.    Reuben Likes, MD 02/10/13 [phone]

## 2014-02-20 ENCOUNTER — Ambulatory Visit (INDEPENDENT_AMBULATORY_CARE_PROVIDER_SITE_OTHER): Payer: No Typology Code available for payment source | Admitting: Family Medicine

## 2014-02-20 ENCOUNTER — Encounter: Payer: Self-pay | Admitting: Family Medicine

## 2014-02-20 ENCOUNTER — Other Ambulatory Visit: Payer: Self-pay

## 2014-02-20 VITALS — BP 124/84 | Temp 98.2°F | Ht 61.0 in | Wt 141.0 lb

## 2014-02-20 DIAGNOSIS — F1010 Alcohol abuse, uncomplicated: Secondary | ICD-10-CM

## 2014-02-20 DIAGNOSIS — R748 Abnormal levels of other serum enzymes: Secondary | ICD-10-CM

## 2014-02-20 DIAGNOSIS — F411 Generalized anxiety disorder: Secondary | ICD-10-CM

## 2014-02-20 DIAGNOSIS — F17200 Nicotine dependence, unspecified, uncomplicated: Secondary | ICD-10-CM

## 2014-02-20 DIAGNOSIS — R002 Palpitations: Secondary | ICD-10-CM | POA: Insufficient documentation

## 2014-02-20 DIAGNOSIS — F419 Anxiety disorder, unspecified: Secondary | ICD-10-CM

## 2014-02-20 LAB — HEPATIC FUNCTION PANEL
ALT: 77 U/L — ABNORMAL HIGH (ref 0–35)
AST: 255 U/L — ABNORMAL HIGH (ref 0–37)
Albumin: 4.1 g/dL (ref 3.5–5.2)
Alkaline Phosphatase: 65 U/L (ref 39–117)
Bilirubin, Direct: 0.2 mg/dL (ref 0.0–0.3)
Total Bilirubin: 0.9 mg/dL (ref 0.3–1.2)
Total Protein: 7.5 g/dL (ref 6.0–8.3)

## 2014-02-20 LAB — TSH: TSH: 1.25 u[IU]/mL (ref 0.35–5.50)

## 2014-02-20 LAB — T4, FREE: Free T4: 0.75 ng/dL (ref 0.60–1.60)

## 2014-02-20 NOTE — Progress Notes (Signed)
Chief Complaint  Patient presents with  . Establish Care    HPI:  [name] is here to establish care.  Last PCP and physical: sees Dr. Raphael Gibney in [place] - has had 4 cervical surgeries and has not paid this bill. So now she needs recs for another gynecologist.  Has the following chronic problems and concerns today:  Patient Active Problem List   Diagnosis Date Noted  . Palpitations - evaluated by Dr. Cathie Olden in the past 02/20/2014  . Anxiety 04/02/2012  . Drug dependence 03/18/2012  . 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   Alcohol Abuse: -licensed clinical addiction specialist -5 drinks every other day -sees [facility] for methadone -wants to checks liver enzymes -does not wish to be on any medications for anxiety and is working to get of methadone -hx of heavy drug use, screened for hep in the past with hep c antibody + but negative viral rna testing and told cleared infection, reports full blood panel last year good and wants no blood tests except for thyroid and liver tests due to panic attacks since weaning on methadone and heavy alcohol use  Health Maintenance: -declined - will see gyn for pap given history ROS: See pertinent positives and negatives per HPI.  Past Medical History  Diagnosis Date  . Anxiety   . Cervical intraepithelial neoplasia III   . 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 H/O varicella   . Abnormal Pap smear     Age 38  . Yeast infection   . Bacterial infection   . Trichomonas   . HPV (human papilloma virus) anogenital infection   . 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     Age 38  . 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 Hx: UTI (urinary tract infection)     Back pain  . Irregular menstrual cycle   . H/O fatigue 2009  . Palpitations 2010  . Headache(784.0)   . Condyloma 2011  . Hx of dizziness 09/20/2011  . Pelvic pain in female 12/05/11  . IV drug user   . Alcohol  abuse     Family History  Problem Relation Age of Onset  . Cervical cancer Mother   . Breast cancer Mother   . Cancer Mother     Breast & cervical   . Stroke Maternal Grandmother   . COPD Maternal Grandfather     Emphysema  . Hypertension Paternal Grandfather   . Stroke Paternal Grandfather     History   Social History  . Marital Status: Married    Spouse Name: N/A    Number of Children: N/A  . Years of Education: N/A   Social History Main Topics  . Smoking status: Current Every Day Smoker -- 1.00 packs/day for 20 years    Types: Cigarettes  . Smokeless tobacco: Never Used  . Alcohol Use: Yes     Comment: socially; every other day after work   . Drug Use: Yes     Comment: Abused rx drugs - on methadone 08/2011  . Sexual Activity: Not Currently    Birth Control/ Protection: Abstinence   Other Topics Concern  . None   Social History Narrative   Work or School: clinical addiction specialist      Home Situation: lives with daughter and wife      Spiritual Beliefs: none      Lifestyle: no regular exercise; good diet  Current outpatient prescriptions:methadone (DOLOPHINE) 10 MG tablet, Take 20 mg by mouth daily. , Disp: , Rfl:   EXAM:  Filed Vitals:   02/20/14 1126  BP: 124/84  Temp: 98.2 F (36.8 C)    Body mass index is 26.66 kg/(m^2).  GENERAL: vitals reviewed and listed above, alert, oriented, appears well hydrated and in no acute distress  HEENT: atraumatic, conjunttiva clear, no obvious abnormalities on inspection of external nose and ears  NECK: no obvious masses on inspection  MS: moves all extremities without noticeable abnormality  PSYCH: pleasant and cooperative, no obvious depression or anxiety  ASSESSMENT AND PLAN:  Discussed the following assessment and plan:  Palpitations - evaluated by Dr. Cathie Olden in the past  Anxiety - Plan: TSH, T4, Free  Alcohol abuse - Plan: Hepatic Function Panel -We reviewed the PMH, PSH,  FH, SH, Meds and Allergies. -We provided refills for any medications we will prescribe as needed. -We addressed current concerns per orders and patient instructions. -We have asked for records for pertinent exams, studies, vaccines and notes from previous providers. -We have advised patient to follow up per instructions below. -offered repeat screening given hx drug use and STIs - declined -advised should see gyn ASAP for follow up regarding her history of CIS - numbers provided for gyn offices if she does not follow up with current gyn -discussed safe alcohol levels -smoking cessation counseling, but currently not a good time for her to quit while weaning off methadone -advised counseling and psych to assist with anxiety - declined -follow up as needed  -Patient advised to return or notify a doctor immediately if symptoms worsen or persist or new concerns arise.  There are no Patient Instructions on file for this visit.   Colin Benton R.

## 2014-02-20 NOTE — Patient Instructions (Signed)
-  We have ordered labs or studies at this visit. It can take up to 1-2 weeks for results and processing. We will contact you with instructions IF your results are abnormal. Normal results will be released to your [facility]. If you have not heard from [place] or can not find your results in [facility] in 2 weeks please contact our office.  -PLEASE SIGN UP FOR [place]   I strongly advise your to see a gynecologist as soon as possible for evaluation  We recommend the following healthy lifestyle measures: - eat a healthy diet consisting of lots of vegetables, fruits, beans, nuts, seeds, healthy meats such as white chicken and fish and whole grains.  - avoid fried foods, fast food, processed foods, sodas, red meet and other fattening foods.  - get a least 150 minutes of aerobic exercise per week.   Follow up in: as needed

## 2014-02-20 NOTE — Progress Notes (Signed)
Pre visit review using our clinic review tool, if applicable. No additional management support is needed unless otherwise documented below in the visit note. 

## 2014-02-21 ENCOUNTER — Telehealth: Payer: Self-pay

## 2014-02-21 ENCOUNTER — Telehealth: Payer: Self-pay | Admitting: Family Medicine

## 2014-02-21 NOTE — Telephone Encounter (Signed)
[phone] (home)  Talked with pt. Discussed common and serious causes of abnormal LFTs. She would like to see GI. Encourage to abstain from alcohol and eat a healthy diet and get regular exercise.

## 2014-02-21 NOTE — Addendum Note (Signed)
Addended by: Colleen Can on: 02/21/2014 04:14 PM   Modules accepted: Orders

## 2014-02-21 NOTE — Telephone Encounter (Signed)
Received a message from pt and pt wanted to know about rest of lab work and I went over that with pt.  Pt would like Dr. Maudie Mercury to call her because she has questions specifically for her.

## 2014-02-21 NOTE — Telephone Encounter (Signed)
Relevant patient education assigned to patient using Emmi. ° °

## 2014-02-25 ENCOUNTER — Encounter: Payer: Self-pay | Admitting: Gastroenterology

## 2014-04-08 ENCOUNTER — Ambulatory Visit (INDEPENDENT_AMBULATORY_CARE_PROVIDER_SITE_OTHER): Payer: No Typology Code available for payment source | Admitting: Gastroenterology

## 2014-04-08 ENCOUNTER — Encounter: Payer: Self-pay | Admitting: Gastroenterology

## 2014-04-08 VITALS — BP 120/92 | HR 124 | Ht 61.5 in | Wt 137.4 lb

## 2014-04-08 DIAGNOSIS — R7989 Other specified abnormal findings of blood chemistry: Secondary | ICD-10-CM | POA: Insufficient documentation

## 2014-04-08 DIAGNOSIS — F411 Generalized anxiety disorder: Secondary | ICD-10-CM

## 2014-04-08 DIAGNOSIS — F419 Anxiety disorder, unspecified: Secondary | ICD-10-CM

## 2014-04-08 DIAGNOSIS — R945 Abnormal results of liver function studies: Secondary | ICD-10-CM

## 2014-04-08 DIAGNOSIS — F1010 Alcohol abuse, uncomplicated: Secondary | ICD-10-CM | POA: Insufficient documentation

## 2014-04-08 MED ORDER — ALPRAZOLAM 0.5 MG PO TABS: 0.5000 mg | ORAL_TABLET | Freq: Three times a day (TID) | ORAL | Status: DC

## 2014-04-08 NOTE — Patient Instructions (Signed)
We have given you a printed prescription of Xanax Call Rollene Fare back at [phone] with the name of the Methadone clinic Come for labs BMET in 3 weeks Reduce alcohol intake

## 2014-04-08 NOTE — Progress Notes (Signed)
HPI :   Patient is a 38 year old female here for evaluation of abnormal LFTs. She has no abdominal pain. 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 She is on Methodone. Patient reports drinking 5 drinks a day for the last year, drank socially before that. No herbs. No OTC meds. No [place] of liver disease. Patient has no abdominal pain. Her only complaint is that of episodic palpitation associated with diaphoresis and inability to move her body. These episodes are occuring once a week and can be associated with vomiting. PCP evaluated and referred her to psychiatry.   Past Medical History  Diagnosis Date  . Anxiety   . Cervical intraepithelial neoplasia III   . 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 H/O varicella   . Abnormal Pap smear     Age 60  . Yeast infection   . Bacterial infection   . Trichomonas   . HPV (human papilloma virus) anogenital infection   . 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     Age 19  . 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 Hx: UTI (urinary tract infection)     Back pain  . Irregular menstrual cycle   . H/O fatigue 2009  . Palpitations 2010  . Headache(784.0)   . Condyloma 2011  . Hx of dizziness 09/20/2011  . Pelvic pain in female 12/05/11  . IV drug user   . Alcohol abuse     Family History  Problem Relation Age of Onset  . Cervical cancer Mother   . Breast cancer Mother   . Cancer Mother     Breast & cervical   . Stroke Maternal Grandmother   . COPD Maternal Grandfather     Emphysema  . Hypertension Paternal Grandfather   . Stroke Paternal Grandfather    History  Substance Use Topics  . Smoking status: Current Every Day Smoker -- 1.00 packs/day for 20 years    Types: Cigarettes  . Smokeless tobacco: Never Used  . Alcohol Use: Yes     Comment: socially; every other day after work    Current Outpatient Prescriptions    Medication Sig Dispense Refill  . methadone (DOLOPHINE) 10 MG tablet Take 20 mg by mouth daily.       Marland Kitchen ALPRAZolam (XANAX) 0.5 MG tablet Take 1 tablet (0.5 mg total) by mouth every 8 (eight) hours.  5 tablet  0   No current facility-administered medications for this visit.   No Known Allergies  Review of Systems: All systems reviewed and negative except where noted in HPI.   Physical Exam: BP 120/92  Pulse 124  Ht 5' 1.5" (1.562 m)  Wt 137 lb 6.4 oz (62.324 kg)  BMI 25.54 kg/m2  LMP 11/08/2013 Constitutional: Pleasant,well-developed, white female in no acute distress. HEENT: Normocephalic and atraumatic. Conjunctivae are normal. No scleral icterus. Neck supple.  Cardiovascular: Normal rate, regular rhythm.  Pulmonary/chest: Effort normal and breath sounds normal. No wheezing, rales or rhonchi. Abdominal: Soft, nondistended, nontender. Bowel sounds active throughout. There are no masses palpable. No hepatomegaly. Extremities: no edema Lymphadenopathy: No cervical adenopathy noted. Neurological: Alert and oriented to person place and time. Skin: Skin is warm and dry. No rashes noted. Psychiatric: Normal mood and affect. Behavior is normal.   ASSESSMENT AND PLAN:  7. 38 year old female with transaminitis. Pattern typical for ETOH. No abdominal  pain. Long discussion with her about need to stop ETOH in order for [place] to evaluate LFTs. Also discussed longterm effects of ETOH on liver. Patient states she cannot abruptly discontinue ETOH and I agree it could lead to withdrawls. She agrees to decrease ETOH intake over next 3 weeks and then come for repeat LFTs. If numbers better will recheck LFTs in another 3 weeks as patient works to totally weans herself from ETOH. If numbers not improving she will need further workup including ultrasound and autoimmune/ genetic markers of liver disease. Will request copy of her viral hepatitis studies.   2. Palpitations / diaphoresis. Symptoms episodic  and associated with vomiting and inability to move her body. PCP has evaluated and referred her to psychiatrist. Working diagnosis apparently anxiety. Will give patient a few Xanax to take during these episodes to help sort things out. She needs to follow through with psych appointment as we will not be able to treat her anxiety. She understands this. [facility] I have asked her to get me contact person to send office note to so you doesn't break any provider / patient contracts regarding controlled substances.

## 2014-04-09 NOTE — Progress Notes (Signed)
Reviewed and agree with management. Mackenzye Mackel D. Farha Dano, M.D., FACG  

## 2014-04-10 ENCOUNTER — Other Ambulatory Visit: Payer: Self-pay | Admitting: *Deleted

## 2014-04-10 DIAGNOSIS — R7989 Other specified abnormal findings of blood chemistry: Secondary | ICD-10-CM

## 2014-04-10 DIAGNOSIS — R945 Abnormal results of liver function studies: Principal | ICD-10-CM

## 2014-06-07 ENCOUNTER — Emergency Department (HOSPITAL_COMMUNITY)
Admission: EM | Admit: 2014-06-07 | Discharge: 2014-06-07 | Disposition: A | Discharge status: Home / Self Care | Payer: No Typology Code available for payment source | Attending: Emergency Medicine | Admitting: Emergency Medicine

## 2014-06-07 ENCOUNTER — Emergency Department (HOSPITAL_COMMUNITY): Payer: No Typology Code available for payment source

## 2014-06-07 ENCOUNTER — Encounter (HOSPITAL_COMMUNITY): Payer: Self-pay | Admitting: Emergency Medicine

## 2014-06-07 DIAGNOSIS — Z79899 Other long term (current) drug therapy: Secondary | ICD-10-CM | POA: Insufficient documentation

## 2014-06-07 DIAGNOSIS — Y9389 Activity, other specified: Secondary | ICD-10-CM | POA: Insufficient documentation

## 2014-06-07 DIAGNOSIS — Z8619 Personal history of other infectious and parasitic diseases: Secondary | ICD-10-CM | POA: Insufficient documentation

## 2014-06-07 DIAGNOSIS — Z87440 Personal history of urinary (tract) infections: Secondary | ICD-10-CM | POA: Insufficient documentation

## 2014-06-07 DIAGNOSIS — W230XXA Caught, crushed, jammed, or pinched between moving objects, initial encounter: Secondary | ICD-10-CM | POA: Insufficient documentation

## 2014-06-07 DIAGNOSIS — Y929 Unspecified place or not applicable: Secondary | ICD-10-CM | POA: Insufficient documentation

## 2014-06-07 DIAGNOSIS — S40029A Contusion of unspecified upper arm, initial encounter: Secondary | ICD-10-CM | POA: Insufficient documentation

## 2014-06-07 DIAGNOSIS — F17200 Nicotine dependence, unspecified, uncomplicated: Secondary | ICD-10-CM | POA: Insufficient documentation

## 2014-06-07 DIAGNOSIS — S40021A Contusion of right upper arm, initial encounter: Secondary | ICD-10-CM

## 2014-06-07 DIAGNOSIS — Z8659 Personal history of other mental and behavioral disorders: Secondary | ICD-10-CM | POA: Insufficient documentation

## 2014-06-07 DIAGNOSIS — Z8742 Personal history of other diseases of the female genital tract: Secondary | ICD-10-CM | POA: Insufficient documentation

## 2014-06-07 DIAGNOSIS — S5010XA Contusion of unspecified forearm, initial encounter: Secondary | ICD-10-CM | POA: Insufficient documentation

## 2014-06-07 DIAGNOSIS — Z8541 Personal history of malignant neoplasm of cervix uteri: Secondary | ICD-10-CM | POA: Insufficient documentation

## 2014-06-07 MED ORDER — HYDROCODONE-ACETAMINOPHEN 5-325 MG PO TABS: 1.0000 | ORAL_TABLET | ORAL | Status: DC | PRN

## 2014-06-07 MED ORDER — HYDROMORPHONE HCL PF 2 MG/ML IJ SOLN
2.0000 mg | Freq: Once | INTRAMUSCULAR | Status: AC
  Administered 2014-06-07: 2 mg via INTRAMUSCULAR
  Filled 2014-06-07: qty 1

## 2014-06-07 NOTE — ED Provider Notes (Signed)
Medical screening examination/treatment/procedure(s) were performed by non-physician practitioner and as supervising physician I was immediately available for consultation/collaboration.   EKG Interpretation None        Ephraim Hamburger, MD 06/07/14 [phone]

## 2014-06-07 NOTE — Discharge Instructions (Signed)
Read the information below.  Use the prescribed medication as directed.  Please discuss all new medications with your pharmacist.  Do not take additional tylenol while taking the prescribed pain medication to avoid overdose.  You may return to the Emergency Department at any time for worsening condition or any new symptoms that concern you.  If you develop uncontrolled pain, weakness or numbness of the extremity, severe discoloration of the skin, or you are unable to move your arm or hand, return to the ER for a recheck.      Hematoma A hematoma is a collection of blood under the skin, in an organ, in a body space, in a joint space, or in other tissue. The blood can clot to form a lump that you can see and feel. The lump is often firm and may sometimes become sore and tender. Most hematomas get better in a few days to weeks. However, some hematomas may be serious and require medical care. Hematomas can range in size from very small to very large. CAUSES  A hematoma can be caused by a blunt or penetrating injury. It can also be caused by spontaneous leakage from a blood vessel under the skin. Spontaneous leakage from a blood vessel is more likely to occur in older people, especially those taking blood thinners. Sometimes, a hematoma can develop after certain medical procedures. SIGNS AND SYMPTOMS   A firm lump on the body.  Possible pain and tenderness in the area.  Bruising.Blue, dark blue, purple-red, or yellowish skin may appear at the site of the hematoma if the hematoma is close to the surface of the skin. For hematomas in deeper tissues or body spaces, the signs and symptoms may be subtle. For example, an intra-abdominal hematoma may cause abdominal pain, weakness, fainting, and shortness of breath. An intracranial hematoma may cause a headache or symptoms such as weakness, trouble speaking, or a change in consciousness. DIAGNOSIS  A hematoma can usually be diagnosed based on your medical  history and a physical exam. Imaging tests may be needed if your health care provider suspects a hematoma in deeper tissues or body spaces, such as the abdomen, head, or chest. These tests may include ultrasonography or a CT scan.  TREATMENT  Hematomas usually go away on their own over time. Rarely does the blood need to be drained out of the body. Large hematomas or those that may affect vital organs will sometimes need surgical drainage or monitoring. HOME CARE INSTRUCTIONS   Apply ice to the injured area:   Put ice in a plastic bag.   Place a towel between your skin and the bag.   Leave the ice on for 20 minutes, 2-3 times a day for the first 1 to 2 days.   After the first 2 days, switch to using warm compresses on the hematoma.   Elevate the injured area to help decrease pain and swelling. Wrapping the area with an elastic bandage may also be helpful. Compression helps to reduce swelling and promotes shrinking of the hematoma. Make sure the bandage is not wrapped too tight.   If your hematoma is on a lower extremity and is painful, crutches may be helpful for a couple days.   Only take over-the-counter or prescription medicines as directed by your health care provider. SEEK IMMEDIATE MEDICAL CARE IF:   You have increasing pain, or your pain is not controlled with medicine.   You have a fever.   You have worsening swelling or discoloration.  Your skin over the hematoma breaks or starts bleeding.   Your hematoma is in your chest or abdomen and you have weakness, shortness of breath, or a change in consciousness.  Your hematoma is on your scalp (caused by a fall or injury) and you have a worsening headache or a change in alertness or consciousness. MAKE SURE YOU:   Understand these instructions.  Will watch your condition.  Will get help right away if you are not doing well or get worse. Document Released: 07/19/2004 Document Revised: 08/07/2013 Document Reviewed:  05/15/2013 [facility] Patient Information 2015 [place], [place]. This information is not intended to replace advice given to you by your health care provider. Make sure you discuss any questions you have with your health care provider.  Contusion A contusion is the result of an injury to the skin and underlying tissues and is usually caused by direct trauma. The injury results in the appearance of a bruise on the skin overlying the injured tissues. Contusions cause rupture and bleeding of the small capillaries and blood vessels and affect function, because the bleeding infiltrates muscles, tendons, nerves, or other soft tissues.  SYMPTOMS   Swelling and often a hard lump in the injured area, either superficial or deep.  Pain and tenderness over the area of the contusion.  Feeling of firmness when pressure is exerted over the contusion.  Discoloration under the skin, beginning with redness and progressing to the characteristic "black and blue" bruise. CAUSES  A contusion is typically the result of direct trauma. This is often by a blunt object.  RISK INCREASES WITH:  Sports that have a high likelihood of trauma (football, boxing, ice hockey, soccer, field hockey, martial arts, basketball, and baseball).  Sports that make falling from a height likely (high-jumping, pole-vaulting, skating, or gymnastics).  Any bleeding disorder (hemophilia) or taking medications that affect clotting (aspirin, nonsteroidal anti-inflammatory medications, or warfarin [Coumadin]).  Inadequate protection of exposed areas during contact sports. PREVENTION  Maintain physical fitness:  Joint and muscle flexibility.  Strength and endurance.  Coordination.  Wear proper protective equipment. Make sure it fits correctly. PROGNOSIS  Contusions typically heal without any complications. Healing time varies with the severity of injury and intake of medications that affect clotting. Contusions usually heal in 1 to 4  weeks. RELATED COMPLICATIONS   Damage to nearby nerves or blood vessels, causing numbness, coldness, or paleness.  Compartment syndrome.  Bleeding into the soft tissues that leads to disability.  Infiltrative-type bleeding, leading to the calcification and impaired function of the injured muscle (rare).  Prolonged healing time if usual activities are resumed too soon.  Infection if the skin over the injury site is broken.  Fracture of the bone underlying the contusion.  Stiffness in the joint where the injured muscle crosses. TREATMENT  Treatment initially consists of resting the injured area as well as medication and ice to reduce inflammation. The use of a compression bandage may also be helpful in minimizing inflammation. As pain diminishes and movement is tolerated, the joint where the affected muscle crosses should be moved to prevent stiffness and the shortening (contracture) of the joint. Movement of the joint should begin as soon as possible. It is also important to work on maintaining strength within the affected muscles. Occasionally, extra padding over the area of contusion may be recommended before returning to sports, particularly if re-injury is likely.  MEDICATION   If pain relief is necessary these medications are often recommended:  Nonsteroidal anti-inflammatory medications, such as aspirin  and ibuprofen.  Other minor pain relievers, such as acetaminophen, are often recommended.  Prescription pain relievers may be given by your caregiver. Use only as directed and only as much as you need. HEAT AND COLD  Cold treatment (icing) relieves pain and reduces inflammation. Cold treatment should be applied for 10 to 15 minutes every 2 to 3 hours for inflammation and pain and immediately after any activity that aggravates your symptoms. Use ice packs or an ice massage. (To do an ice massage fill a large styrofoam cup with water and freeze. Tear a small amount of foam from the  top so ice protrudes. Massage ice firmly over the injured area in a circle about the size of a softball.)  Heat treatment may be used prior to performing the stretching and strengthening activities prescribed by your caregiver, physical therapist, or athletic trainer. Use a heat pack or a warm soak. SEEK MEDICAL CARE IF:   Symptoms get worse or do not improve despite treatment in a few days.  You have difficulty moving a joint.  Any extremity becomes extremely painful, numb, pale, or cool (This is an emergency!).  Medication produces any side effects (bleeding, upset stomach, or allergic reaction).  Signs of infection (drainage from skin, headache, muscle aches, dizziness, fever, or general ill feeling) occur if skin was broken. Document Released: 12/05/2005 Document Revised: 02/27/2012 Document Reviewed: 03/19/2009 [facility] Patient Information 2015 [place], [place]. This information is not intended to replace advice given to you by your health care provider. Make sure you discuss any questions you have with your health care provider.

## 2014-06-07 NOTE — ED Notes (Signed)
Pt arrived to the ED with a complaint of right sided arm pain.  Pt has a large  hematoma that covers the upper forearm and lower bicep area.  Pt was moving furniture when she hit it with a large piece of furniture wedging it between the furniture and a car

## 2014-06-07 NOTE — ED Provider Notes (Signed)
CSN: [id number]     Arrival date & time 06/07/14  1856 History   First MD Initiated Contact with Patient 06/07/14 1930     Chief Complaint  Patient presents with  . Arm Pain     (Consider location/radiation/quality/duration/timing/severity/associated sxs/prior Treatment) HPI  Patient presents with right arm pain and bruising that began yesterday.  States she was moving furniture yesterday and got her arm caught between two pieces of large furniture.  States she initially had a small knot and a small bruise and it has gradually increased until today.  She now has a large swollen area and bruising that extends from her wrist to her midhumerus.  She also notes tingling in her entire hand and forearm.  Has taken nothing for her symptoms.  She is able to move her elbow but notes that it hurts.  Denies other injury.   Past Medical History  Diagnosis Date  . Anxiety   . Cervical intraepithelial neoplasia III   . 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 H/O varicella   . Abnormal Pap smear     Age 38  . Yeast infection   . Bacterial infection   . Trichomonas   . HPV (human papilloma virus) anogenital infection   . 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     Age 37  . 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 Hx: UTI (urinary tract infection)     Back pain  . Irregular menstrual cycle   . H/O fatigue 2009  . Palpitations 2010  . Headache(784.0)   . Condyloma 2011  . Hx of dizziness 09/20/2011  . Pelvic pain in female 12/05/11  . IV drug user   . Alcohol abuse    Past Surgical History  Procedure Laterality Date  . Conization cervix      x 3  . A*wisdom teeth ext    . Svd      x 1  . Cervical conization w/bx  11/30/2011    Procedure: CONIZATION CERVIX WITH BIOPSY;  Surgeon: [name], MD;  Location: [facility];  Service: Gynecology;  Laterality: N/A;  . Dilation and curettage of uterus  11/30/2011    Procedure: DILATATION AND  CURETTAGE;  Surgeon: [name], MD;  Location: [facility];  Service: Gynecology;  Laterality: N/A;  . Cervical conization w/bx  03/16/2012    Procedure: CONIZATION CERVIX WITH BIOPSY;  Surgeon: Ena Dawley, MD;  Location: [facility];  Service: Gynecology;  Laterality: N/A;   Family History  Problem Relation Age of Onset  . Cervical cancer Mother   . Breast cancer Mother   . Cancer Mother     Breast & cervical   . Stroke Maternal Grandmother   . COPD Maternal Grandfather     Emphysema  . Hypertension Paternal Grandfather   . Stroke Paternal Grandfather    History  Substance Use Topics  . Smoking status: Current Every Day Smoker -- 1.00 packs/day for 20 years    Types: Cigarettes  . Smokeless tobacco: Never Used  . Alcohol Use: Yes     Comment: socially; every other day after work    OB History   Grav Para Term Preterm Abortions TAB SAB Ect Mult Living   1 1 1       1      Review of Systems  All other systems reviewed and are negative.     Allergies  Review of patient's allergies indicates no known allergies.  Home Medications   Prior to Admission medications   Medication Sig Start Date End Date Taking? Authorizing Provider  methadone (DOLOPHINE) 10 MG tablet Take 25 mg by mouth every morning.   Yes Historical Provider, MD   BP 125/80  Pulse 121  Temp(Src) 98.1 F (36.7 C) (Oral)  Resp 16  SpO2 96% Physical Exam  Nursing note and vitals reviewed. Constitutional: She appears well-developed and well-nourished. No distress.  HENT:  Head: Normocephalic and atraumatic.  Neck: Neck supple.  Pulmonary/Chest: Effort normal.  Neurological: She is alert.  Skin: She is not diaphoretic.  Right arm with ecchymosis, hematoma extending from midhumerus into midforearm.  Tender to palpation.  Radial pulse is intact.  Distal sensation is intact but "tingling." Full AROM with pain.  No temperature change.  Compartments soft.     ED Course  Procedures (including critical  care time) Labs Review Labs Reviewed - No data to display  Imaging Review Dg Elbow Complete Right  06/07/2014   CLINICAL DATA:  ARM PAIN  EXAM: RIGHT ELBOW - COMPLETE 3+ VIEW  COMPARISON:  None.  FINDINGS: There is no evidence of fracture, dislocation, or joint effusion. There is no evidence of arthropathy or other focal bone abnormality. Soft tissues are unremarkable.  IMPRESSION: Negative.   Electronically Signed   By: Margaree Mackintosh M.D.   On: 06/07/2014 20:15     EKG Interpretation None      8:53 PM Pt feeling better after ice pack and pain medication.   MDM   Final diagnoses:  Hematoma of arm, right, initial encounter    Pt with ecchymosis and hematoma over right arm that began yesterday after arm got caught between two pieces of furniture.  No fracture.  Pulses intact, sensation intact though subjectively tingling.  Compartments are soft.  Pt given ice pack, pain medication.  Checked on [employer].  Pt on methadone but states this is for migraines, she would like narcotic medications for home - states "I'm not going to go nuts."  .PCP follow up.  Discussed result, findings, treatment, and follow up  with patient.  Pt given return precautions.  Pt verbalizes understanding and agrees with plan.       I doubt any other EMC precluding discharge at this time including, but not necessarily limited to the following: compartment syndrome.    Clayton Bibles, PA-C 06/07/14 2124

## 2014-07-08 ENCOUNTER — Inpatient Hospital Stay (HOSPITAL_COMMUNITY)
Admission: EM | Admit: 2014-07-08 | Discharge: 2014-07-09 | DRG: 897 | Disposition: A | Discharge status: Home / Self Care | Payer: No Typology Code available for payment source | Attending: Internal Medicine | Admitting: Internal Medicine

## 2014-07-08 ENCOUNTER — Encounter (HOSPITAL_COMMUNITY): Payer: Self-pay | Admitting: Emergency Medicine

## 2014-07-08 DIAGNOSIS — E872 Acidosis, unspecified: Secondary | ICD-10-CM | POA: Diagnosis present

## 2014-07-08 DIAGNOSIS — R1115 Cyclical vomiting syndrome unrelated to migraine: Secondary | ICD-10-CM | POA: Diagnosis present

## 2014-07-08 DIAGNOSIS — F1193 Opioid use, unspecified with withdrawal: Secondary | ICD-10-CM | POA: Diagnosis present

## 2014-07-08 DIAGNOSIS — R7989 Other specified abnormal findings of blood chemistry: Secondary | ICD-10-CM | POA: Diagnosis present

## 2014-07-08 DIAGNOSIS — Z8249 Family history of ischemic heart disease and other diseases of the circulatory system: Secondary | ICD-10-CM

## 2014-07-08 DIAGNOSIS — E440 Moderate protein-calorie malnutrition: Secondary | ICD-10-CM | POA: Insufficient documentation

## 2014-07-08 DIAGNOSIS — F10939 Alcohol use, unspecified with withdrawal, unspecified: Principal | ICD-10-CM | POA: Diagnosis present

## 2014-07-08 DIAGNOSIS — F1120 Opioid dependence, uncomplicated: Secondary | ICD-10-CM | POA: Diagnosis present

## 2014-07-08 DIAGNOSIS — F1011 Alcohol abuse, in remission: Secondary | ICD-10-CM | POA: Diagnosis present

## 2014-07-08 DIAGNOSIS — F10239 Alcohol dependence with withdrawal, unspecified: Secondary | ICD-10-CM | POA: Diagnosis not present

## 2014-07-08 DIAGNOSIS — F1920 Other psychoactive substance dependence, uncomplicated: Secondary | ICD-10-CM

## 2014-07-08 DIAGNOSIS — F10930 Alcohol use, unspecified with withdrawal, uncomplicated: Secondary | ICD-10-CM

## 2014-07-08 DIAGNOSIS — IMO0001 Reserved for inherently not codable concepts without codable children: Secondary | ICD-10-CM

## 2014-07-08 DIAGNOSIS — E878 Other disorders of electrolyte and fluid balance, not elsewhere classified: Secondary | ICD-10-CM

## 2014-07-08 DIAGNOSIS — F1123 Opioid dependence with withdrawal: Secondary | ICD-10-CM

## 2014-07-08 DIAGNOSIS — F1020 Alcohol dependence, uncomplicated: Secondary | ICD-10-CM | POA: Diagnosis present

## 2014-07-08 DIAGNOSIS — R935 Abnormal findings on diagnostic imaging of other abdominal regions, including retroperitoneum: Secondary | ICD-10-CM | POA: Diagnosis present

## 2014-07-08 DIAGNOSIS — F10230 Alcohol dependence with withdrawal, uncomplicated: Secondary | ICD-10-CM

## 2014-07-08 DIAGNOSIS — F17200 Nicotine dependence, unspecified, uncomplicated: Secondary | ICD-10-CM | POA: Diagnosis present

## 2014-07-08 DIAGNOSIS — F419 Anxiety disorder, unspecified: Secondary | ICD-10-CM | POA: Diagnosis present

## 2014-07-08 DIAGNOSIS — Z823 Family history of stroke: Secondary | ICD-10-CM

## 2014-07-08 DIAGNOSIS — Z803 Family history of malignant neoplasm of breast: Secondary | ICD-10-CM

## 2014-07-08 DIAGNOSIS — R112 Nausea with vomiting, unspecified: Secondary | ICD-10-CM | POA: Diagnosis not present

## 2014-07-08 DIAGNOSIS — D069 Carcinoma in situ of cervix, unspecified: Secondary | ICD-10-CM

## 2014-07-08 DIAGNOSIS — Z8541 Personal history of malignant neoplasm of cervix uteri: Secondary | ICD-10-CM | POA: Diagnosis present

## 2014-07-08 DIAGNOSIS — Z8049 Family history of malignant neoplasm of other genital organs: Secondary | ICD-10-CM

## 2014-07-08 DIAGNOSIS — E871 Hypo-osmolality and hyponatremia: Secondary | ICD-10-CM

## 2014-07-08 DIAGNOSIS — F411 Generalized anxiety disorder: Secondary | ICD-10-CM | POA: Diagnosis present

## 2014-07-08 DIAGNOSIS — R945 Abnormal results of liver function studies: Secondary | ICD-10-CM

## 2014-07-08 DIAGNOSIS — R002 Palpitations: Secondary | ICD-10-CM

## 2014-07-08 DIAGNOSIS — E876 Hypokalemia: Secondary | ICD-10-CM

## 2014-07-08 DIAGNOSIS — F1010 Alcohol abuse, uncomplicated: Secondary | ICD-10-CM

## 2014-07-08 LAB — SALICYLATE LEVEL: Salicylate Lvl: <2 mg/dL — ABNORMAL LOW (ref 2.8–20.0)

## 2014-07-08 LAB — RAPID URINE DRUG SCREEN, HOSP PERFORMED
Amphetamines: NOT DETECTED
Barbiturates: NOT DETECTED
Benzodiazepines: NOT DETECTED
Cocaine: NOT DETECTED
Opiates: NOT DETECTED
Tetrahydrocannabinol: NOT DETECTED

## 2014-07-08 LAB — URINALYSIS, ROUTINE W REFLEX MICROSCOPIC
Bilirubin Urine: NEGATIVE
Glucose, UA: NEGATIVE mg/dL
Hgb urine dipstick: NEGATIVE
Ketones, ur: >80 mg/dL — AB
Leukocytes, UA: NEGATIVE
Nitrite: NEGATIVE
Protein, ur: NEGATIVE mg/dL
Specific Gravity, Urine: 1.018 (ref 1.005–1.030)
Urobilinogen, UA: 0.2 mg/dL (ref 0.0–1.0)
pH: 5.5 (ref 5.0–8.0)

## 2014-07-08 LAB — CBC WITH DIFFERENTIAL/PLATELET
Basophils Absolute: 0 10*3/uL (ref 0.0–0.1)
Basophils Relative: 0 % (ref 0–1)
Eosinophils Absolute: 0 10*3/uL (ref 0.0–0.7)
Eosinophils Relative: 0 % (ref 0–5)
HCT: 37.2 % (ref 36.0–46.0)
Hemoglobin: 12.7 g/dL (ref 12.0–15.0)
Lymphocytes Relative: 20 % (ref 12–46)
Lymphs Abs: 1.5 10*3/uL (ref 0.7–4.0)
MCH: 36.9 pg — ABNORMAL HIGH (ref 26.0–34.0)
MCHC: 34.1 g/dL (ref 30.0–36.0)
MCV: 108.1 fL — ABNORMAL HIGH (ref 78.0–100.0)
Monocytes Absolute: 0.8 10*3/uL (ref 0.1–1.0)
Monocytes Relative: 11 % (ref 3–12)
Neutro Abs: 5.4 10*3/uL (ref 1.7–7.7)
Neutrophils Relative %: 69 % (ref 43–77)
Platelets: 173 10*3/uL (ref 150–400)
RBC: 3.44 MIL/uL — ABNORMAL LOW (ref 3.87–5.11)
RDW: 13 % (ref 11.5–15.5)
WBC: 7.7 10*3/uL (ref 4.0–10.5)

## 2014-07-08 LAB — BLOOD GAS, VENOUS
Acid-base deficit: 1.4 mmol/L (ref 0.0–2.0)
Bicarbonate: 20.8 mEq/L (ref 20.0–24.0)
FIO2: 0.21 %
O2 Saturation: 85.9 %
Patient temperature: 98.7
TCO2: 18.5 mmol/L (ref 0–100)
pCO2, Ven: 28.9 mmHg — ABNORMAL LOW (ref 45.0–50.0)
pH, Ven: 7.471 — ABNORMAL HIGH (ref 7.250–7.300)
pO2, Ven: 52.9 mmHg — ABNORMAL HIGH (ref 30.0–45.0)

## 2014-07-08 LAB — COMPREHENSIVE METABOLIC PANEL
ALT: 123 U/L — ABNORMAL HIGH (ref 0–35)
AST: 372 U/L — ABNORMAL HIGH (ref 0–37)
Albumin: 3.5 g/dL (ref 3.5–5.2)
Alkaline Phosphatase: 126 U/L — ABNORMAL HIGH (ref 39–117)
Anion gap: 29 — ABNORMAL HIGH (ref 5–15)
BUN: 5 mg/dL — ABNORMAL LOW (ref 6–23)
CO2: 20 mEq/L (ref 19–32)
Calcium: 9.2 mg/dL (ref 8.4–10.5)
Chloride: 85 mEq/L — ABNORMAL LOW (ref 96–112)
Creatinine, Ser: 0.53 mg/dL (ref 0.50–1.10)
GFR calc Af Amer: >90 mL/min (ref >90)
GFR calc non Af Amer: >90 mL/min (ref >90)
Glucose, Bld: 86 mg/dL (ref 70–99)
Potassium: 3.2 mEq/L — ABNORMAL LOW (ref 3.7–5.3)
Sodium: 134 mEq/L — ABNORMAL LOW (ref 137–147)
Total Bilirubin: 1 mg/dL (ref 0.3–1.2)
Total Protein: 7.3 g/dL (ref 6.0–8.3)

## 2014-07-08 LAB — LIPASE, BLOOD: Lipase: 29 U/L (ref 11–59)

## 2014-07-08 LAB — POC URINE PREG, ED: Preg Test, Ur: NEGATIVE

## 2014-07-08 LAB — ETHANOL: Alcohol, Ethyl (B): 86 mg/dL — ABNORMAL HIGH (ref 0–11)

## 2014-07-08 LAB — WET PREP, GENITAL
Clue Cells Wet Prep HPF POC: NONE SEEN
Trich, Wet Prep: NONE SEEN
Yeast Wet Prep HPF POC: NONE SEEN

## 2014-07-08 LAB — I-STAT CG4 LACTIC ACID, ED: Lactic Acid, Venous: 4.96 mmol/L — ABNORMAL HIGH (ref 0.5–2.2)

## 2014-07-08 MED ORDER — SODIUM CHLORIDE 0.9 % IV SOLN: Freq: Once | INTRAVENOUS | Status: DC

## 2014-07-08 MED ORDER — ONDANSETRON HCL 4 MG/2ML IJ SOLN
4.0000 mg | Freq: Once | INTRAMUSCULAR | Status: AC
  Administered 2014-07-08: 4 mg via INTRAVENOUS
  Filled 2014-07-08: qty 2

## 2014-07-08 MED ORDER — SODIUM CHLORIDE 0.9 % IV BOLUS (SEPSIS)
2000.0000 mL | INTRAVENOUS | Status: AC
  Administered 2014-07-08: 2000 mL via INTRAVENOUS

## 2014-07-08 MED ORDER — LORAZEPAM 2 MG/ML IJ SOLN
1.0000 mg | Freq: Once | INTRAMUSCULAR | Status: AC
  Administered 2014-07-08: 1 mg via INTRAVENOUS
  Filled 2014-07-08: qty 1

## 2014-07-08 NOTE — ED Provider Notes (Signed)
CSN: [id number]     Arrival date & time 07/08/14  1707 History   First MD Initiated Contact with Patient 07/08/14 2014     Chief Complaint  Patient presents with  . Withdrawal     (Consider location/radiation/quality/duration/timing/severity/associated sxs/prior Treatment) The history is provided by the patient and medical records. No language interpreter was used.    [name] is a 38 y.o. female  with a hx of anxiety, IV drug use, EtOH abuse, methadone useage presents to the Emergency Department complaining of gradual, persistent, progressively worsening nausea and vomiting onset 3 weeks ago when she began to wean herself off the EtOH after 2 years of drinking. Associated symptoms include diarrhea, decreased urine output and generalized weakness.  Pt reports she is still drinking all day everyday.  She is now drinking a 5th per day.   Pt reports she was drinking two 5ths per day before this. 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  She reports she was on the methadone after becoming addicted to pain medications in 2012.  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  She weaned to 5mg  Q3days and her last dose was 3 days ago and she reports she is not going to take any more.  Pt reports emesis is NBNB and diarrhea is without melena or hematochezia.  She reports she has not eaten anything solid in several days because she vomits every time she attempts to eat.  Pt's partner reports she has lost about 8 lbs since this began.  Pt denies fever, chills, headache, neck pain, chest pain, SOB, dizziness, syncope, dysuria, hematuria.       Past Medical History  Diagnosis Date  . Anxiety   . Cervical intraepithelial neoplasia III   . 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 H/O varicella   . Abnormal Pap smear     Age 63  . Yeast infection   . Bacterial infection   . Trichomonas   .  HPV (human papilloma virus) anogenital infection   . 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 Hx: UTI (urinary tract infection)     Back pain  . Irregular menstrual cycle   . H/O fatigue 2009  . Palpitations 2010  . Headache(784.0)   . Condyloma 2011  . Hx of dizziness 09/20/2011  . Pelvic pain in female 12/05/11  . IV drug user   . Alcohol abuse    Past Surgical History  Procedure Laterality Date  . Conization cervix      x 3  . A*wisdom teeth ext    . Svd      x 1  . Cervical conization w/bx  11/30/2011    Procedure: CONIZATION CERVIX WITH BIOPSY;  Surgeon: [name], MD;  Location: [facility];  Service: Gynecology;  Laterality: N/A;  . Dilation and curettage of uterus  11/30/2011    Procedure: DILATATION AND CURETTAGE;  Surgeon: [name], MD;  Location: [facility];  Service: Gynecology;  Laterality: N/A;  . Cervical conization w/bx  03/16/2012    Procedure: CONIZATION CERVIX WITH BIOPSY;  Surgeon: Ena Dawley, MD;  Location: [facility];  Service: Gynecology;  Laterality: N/A;   Family History  Problem Relation Age of Onset  . Cervical cancer Mother   .  Breast cancer Mother   . Cancer Mother     Breast & cervical   . Stroke Maternal Grandmother   . COPD Maternal Grandfather     Emphysema  . Hypertension Paternal Grandfather   . Stroke Paternal Grandfather    History  Substance Use Topics  . Smoking status: Current Every Day Smoker -- 1.00 packs/day for 20 years    Types: Cigarettes  . Smokeless tobacco: Never Used  . Alcohol Use: Yes     Comment: socially; every other day after work    OB History   Grav Para Term Preterm Abortions TAB SAB Ect Mult Living   1 1 1       1      Review of Systems  Constitutional: Negative for fever, diaphoresis, appetite change, fatigue and unexpected weight change.  HENT: Negative for mouth sores.   Eyes: Negative for visual disturbance.   Respiratory: Negative for cough, chest tightness, shortness of breath and wheezing.   Cardiovascular: Negative for chest pain.  Gastrointestinal: Positive for nausea and vomiting. Negative for abdominal pain, diarrhea and constipation.  Endocrine: Negative for polydipsia, polyphagia and polyuria.  Genitourinary: Negative for dysuria, urgency, frequency and hematuria.  Musculoskeletal: Negative for back pain and neck stiffness.  Skin: Negative for rash.  Allergic/Immunologic: Negative for immunocompromised state.  Neurological: Negative for syncope, light-headedness and headaches.  Hematological: Does not bruise/bleed easily.  Psychiatric/Behavioral: Negative for sleep disturbance. The patient is not nervous/anxious.       Allergies  Review of patient's allergies indicates no known allergies.  Home Medications   Prior to Admission medications   Medication Sig Start Date End Date Taking? Authorizing Provider  Multiple Vitamin (MULTIVITAMIN WITH MINERALS) TABS tablet Take 1 tablet by mouth daily.   Yes Historical Provider, MD   BP 101/54  Pulse 98  Temp(Src) 98.9 F (37.2 C) (Oral)  Resp 20  SpO2 99% Physical Exam  Nursing note and vitals reviewed. Constitutional: She is oriented to person, place, and time. She appears well-developed and well-nourished. No distress.  Awake, alert, nontoxic appearance  HENT:  Head: Normocephalic and atraumatic.  Mouth/Throat: Oropharynx is clear and moist. No oropharyngeal exudate.  Eyes: Conjunctivae are normal. No scleral icterus.  Neck: Normal range of motion. Neck supple.  Cardiovascular: Normal rate, regular rhythm, normal heart sounds and intact distal pulses.   No murmur heard. No tachycardia  Pulmonary/Chest: Effort normal and breath sounds normal. No respiratory distress. She has no wheezes.  Abdominal: Soft. Bowel sounds are normal. She exhibits no distension and no mass. There is hepatomegaly. There is no splenomegaly. There is  generalized tenderness. There is guarding. There is no rebound and no CVA tenderness. Hernia confirmed negative in the right inguinal area and confirmed negative in the left inguinal area.  Generalized abdominal tenderness with mild, voluntary guarding and hepatomegaly No rigidity, rebound or peritoneal signs No CVA tenderness   Genitourinary: Uterus normal. Pelvic exam was performed with patient supine. There is no rash, tenderness, lesion or injury on the right labia. There is no rash, tenderness, lesion or injury on the left labia. Uterus is not deviated, not enlarged, not fixed and not tender. Cervix exhibits no motion tenderness, no discharge and no friability. Right adnexum displays no mass, no tenderness and no fullness. Left adnexum displays no mass, no tenderness and no fullness. No erythema, tenderness or bleeding around the vagina. No foreign body around the vagina. No signs of injury around the vagina. Vaginal discharge (small, thick, white, non-malodorous) found.  Musculoskeletal: Normal range of motion. She exhibits no edema.  Lymphadenopathy:    She has no cervical adenopathy.       Right: No inguinal adenopathy present.       Left: No inguinal adenopathy present.  Neurological: She is alert and oriented to person, place, and time. She exhibits normal muscle tone. Coordination normal.  Speech is clear and goal oriented Moves extremities without ataxia  Skin: Skin is warm and dry. No rash noted. She is not diaphoretic. No erythema.  Psychiatric: She has a normal mood and affect.    ED Course  Procedures (including critical care time) Labs Review Labs Reviewed  WET PREP, GENITAL - Abnormal; Notable for the following:    WBC, Wet Prep HPF POC FEW (*)    All other components within normal limits  CBC WITH DIFFERENTIAL - Abnormal; Notable for the following:    RBC 3.44 (*)    MCV 108.1 (*)    MCH 36.9 (*)    All other components within normal limits  COMPREHENSIVE METABOLIC  PANEL - Abnormal; Notable for the following:    Sodium 134 (*)    Potassium 3.2 (*)    Chloride 85 (*)    BUN 5 (*)    AST 372 (*)    ALT 123 (*)    Alkaline Phosphatase 126 (*)    Anion gap 29 (*)    All other components within normal limits  ETHANOL - Abnormal; Notable for the following:    Alcohol, Ethyl (B) 86 (*)    All other components within normal limits  URINALYSIS, ROUTINE W REFLEX MICROSCOPIC - Abnormal; Notable for the following:    APPearance CLOUDY (*)    Ketones, ur >80 (*)    All other components within normal limits  BLOOD GAS, VENOUS - Abnormal; Notable for the following:    pH, Ven 7.471 (*)    pCO2, Ven 28.9 (*)    pO2, Ven 52.9 (*)    All other components within normal limits  SALICYLATE LEVEL - Abnormal; Notable for the following:    Salicylate Lvl <5.3 (*)    All other components within normal limits  I-STAT CG4 LACTIC ACID, ED - Abnormal; Notable for the following:    Lactic Acid, Venous 4.96 (*)    All other components within normal limits  I-STAT CG4 LACTIC ACID, ED - Abnormal; Notable for the following:    Lactic Acid, Venous 2.30 (*)    All other components within normal limits  GC/CHLAMYDIA PROBE AMP  URINE RAPID DRUG SCREEN (HOSP PERFORMED)  LIPASE, BLOOD  POC URINE PREG, ED    Imaging Review No results found.   EKG Interpretation None      MDM   Final diagnoses:  Acidosis  Liver function study, abnormal  ETOH abuse  Hyponatremia  Hypokalemia  Hypochloremia    [name] presents with c/o persistent N/V and "withdrawl" from EtOH without seizure activity.  CBC without leukocytosis, CMP with hyponatremia, hypochloremia, hypokalemia.  Anion gap 29.  We'll obtain a sedimentation rate and continue to evaluate.  Ethyl alcohol level 86; not enough to account for this gap. Patient denies drinking ethanol, hands sanitizer, Draino, antifreeze or any other substance  9:00PM Lactic acid 4.9, wet prep without evidence of pelvic  inflammatory disease and no cervical motion tenderness on exam.  Will obtain VBG.  9:30PM Blood gas with alkalosis, pH 7.47.  Will obtain salicylate level  29:92EQ Test negative, drug screen negative, urinalysis without evidence  of urinary tract infection but he comes in salicylate level normal.  12:18 AM Discussed with Dr. Benny Lennert who requests an abd CT.   Will admit to tele pending CT.    12:30 AM Repeat lactic acid 2.3.  BP 103/59  Pulse 72  Temp(Src) 98.2 F (36.8 C) (Oral)  Resp 18  Ht 5' (1.524 m)  Wt 132 lb 4.8 oz (60.011 kg)  BMI 25.84 kg/m2  SpO2 100%    Abigail Butts, PA-C 07/09/14 4235

## 2014-07-08 NOTE — ED Notes (Signed)
Patient made aware that a urine sample is needed. Patient states that she is unable to void at this time. Patient encouraged to void when able.

## 2014-07-08 NOTE — ED Notes (Signed)
Pt states she is in alcohol and methadone withdrawal and has not been able to eat in the past 3 weeks. Pt states she is decreasing alcohol and methadone usage.

## 2014-07-09 ENCOUNTER — Encounter (HOSPITAL_COMMUNITY): Payer: Self-pay

## 2014-07-09 ENCOUNTER — Emergency Department (HOSPITAL_COMMUNITY): Payer: No Typology Code available for payment source

## 2014-07-09 DIAGNOSIS — IMO0001 Reserved for inherently not codable concepts without codable children: Secondary | ICD-10-CM | POA: Diagnosis present

## 2014-07-09 DIAGNOSIS — F10939 Alcohol use, unspecified with withdrawal, unspecified: Principal | ICD-10-CM | POA: Diagnosis present

## 2014-07-09 DIAGNOSIS — F10239 Alcohol dependence with withdrawal, unspecified: Secondary | ICD-10-CM | POA: Diagnosis present

## 2014-07-09 DIAGNOSIS — E876 Hypokalemia: Secondary | ICD-10-CM | POA: Diagnosis present

## 2014-07-09 DIAGNOSIS — E872 Acidosis, unspecified: Secondary | ICD-10-CM | POA: Diagnosis present

## 2014-07-09 DIAGNOSIS — F1920 Other psychoactive substance dependence, uncomplicated: Secondary | ICD-10-CM

## 2014-07-09 DIAGNOSIS — F1011 Alcohol abuse, in remission: Secondary | ICD-10-CM | POA: Diagnosis present

## 2014-07-09 DIAGNOSIS — F1020 Alcohol dependence, uncomplicated: Secondary | ICD-10-CM | POA: Diagnosis present

## 2014-07-09 DIAGNOSIS — Z803 Family history of malignant neoplasm of breast: Secondary | ICD-10-CM | POA: Diagnosis not present

## 2014-07-09 DIAGNOSIS — R1115 Cyclical vomiting syndrome unrelated to migraine: Secondary | ICD-10-CM | POA: Diagnosis present

## 2014-07-09 DIAGNOSIS — E871 Hypo-osmolality and hyponatremia: Secondary | ICD-10-CM | POA: Diagnosis present

## 2014-07-09 DIAGNOSIS — E440 Moderate protein-calorie malnutrition: Secondary | ICD-10-CM | POA: Insufficient documentation

## 2014-07-09 DIAGNOSIS — R112 Nausea with vomiting, unspecified: Secondary | ICD-10-CM | POA: Diagnosis present

## 2014-07-09 DIAGNOSIS — F411 Generalized anxiety disorder: Secondary | ICD-10-CM

## 2014-07-09 DIAGNOSIS — D069 Carcinoma in situ of cervix, unspecified: Secondary | ICD-10-CM | POA: Diagnosis present

## 2014-07-09 DIAGNOSIS — Z823 Family history of stroke: Secondary | ICD-10-CM | POA: Diagnosis not present

## 2014-07-09 DIAGNOSIS — F1010 Alcohol abuse, uncomplicated: Secondary | ICD-10-CM

## 2014-07-09 DIAGNOSIS — F1123 Opioid dependence with withdrawal: Secondary | ICD-10-CM | POA: Diagnosis present

## 2014-07-09 DIAGNOSIS — Z8049 Family history of malignant neoplasm of other genital organs: Secondary | ICD-10-CM | POA: Diagnosis not present

## 2014-07-09 DIAGNOSIS — F1193 Opioid use, unspecified with withdrawal: Secondary | ICD-10-CM | POA: Diagnosis present

## 2014-07-09 DIAGNOSIS — R935 Abnormal findings on diagnostic imaging of other abdominal regions, including retroperitoneum: Secondary | ICD-10-CM | POA: Diagnosis present

## 2014-07-09 DIAGNOSIS — F17200 Nicotine dependence, unspecified, uncomplicated: Secondary | ICD-10-CM | POA: Diagnosis present

## 2014-07-09 DIAGNOSIS — Z8249 Family history of ischemic heart disease and other diseases of the circulatory system: Secondary | ICD-10-CM | POA: Diagnosis not present

## 2014-07-09 DIAGNOSIS — F1120 Opioid dependence, uncomplicated: Secondary | ICD-10-CM | POA: Diagnosis present

## 2014-07-09 LAB — CBC WITH DIFFERENTIAL/PLATELET
Basophils Absolute: 0 10*3/uL (ref 0.0–0.1)
Basophils Relative: 1 % (ref 0–1)
Eosinophils Absolute: 0 10*3/uL (ref 0.0–0.7)
Eosinophils Relative: 1 % (ref 0–5)
HCT: 32.7 % — ABNORMAL LOW (ref 36.0–46.0)
Hemoglobin: 11.4 g/dL — ABNORMAL LOW (ref 12.0–15.0)
Lymphocytes Relative: 22 % (ref 12–46)
Lymphs Abs: 1.1 10*3/uL (ref 0.7–4.0)
MCH: 37 pg — ABNORMAL HIGH (ref 26.0–34.0)
MCHC: 34.9 g/dL (ref 30.0–36.0)
MCV: 106.2 fL — ABNORMAL HIGH (ref 78.0–100.0)
Monocytes Absolute: 0.6 10*3/uL (ref 0.1–1.0)
Monocytes Relative: 13 % — ABNORMAL HIGH (ref 3–12)
Neutro Abs: 3.1 10*3/uL (ref 1.7–7.7)
Neutrophils Relative %: 63 % (ref 43–77)
Platelets: 152 10*3/uL (ref 150–400)
RBC: 3.08 MIL/uL — ABNORMAL LOW (ref 3.87–5.11)
RDW: 13.1 % (ref 11.5–15.5)
WBC: 4.9 10*3/uL (ref 4.0–10.5)

## 2014-07-09 LAB — COMPREHENSIVE METABOLIC PANEL
ALT: 101 U/L — ABNORMAL HIGH (ref 0–35)
AST: 283 U/L — ABNORMAL HIGH (ref 0–37)
Albumin: 2.9 g/dL — ABNORMAL LOW (ref 3.5–5.2)
Alkaline Phosphatase: 103 U/L (ref 39–117)
Anion gap: 19 — ABNORMAL HIGH (ref 5–15)
BUN: 4 mg/dL — ABNORMAL LOW (ref 6–23)
CO2: 23 mEq/L (ref 19–32)
Calcium: 7.9 mg/dL — ABNORMAL LOW (ref 8.4–10.5)
Chloride: 94 mEq/L — ABNORMAL LOW (ref 96–112)
Creatinine, Ser: 0.5 mg/dL (ref 0.50–1.10)
GFR calc Af Amer: >90 mL/min (ref >90)
GFR calc non Af Amer: >90 mL/min (ref >90)
Glucose, Bld: 105 mg/dL — ABNORMAL HIGH (ref 70–99)
Potassium: 3.5 mEq/L — ABNORMAL LOW (ref 3.7–5.3)
Sodium: 136 mEq/L — ABNORMAL LOW (ref 137–147)
Total Bilirubin: 1.1 mg/dL (ref 0.3–1.2)
Total Protein: 6.1 g/dL (ref 6.0–8.3)

## 2014-07-09 LAB — GC/CHLAMYDIA PROBE AMP
CT Probe RNA: NEGATIVE
GC Probe RNA: NEGATIVE

## 2014-07-09 LAB — MAGNESIUM: Magnesium: 1.7 mg/dL (ref 1.5–2.5)

## 2014-07-09 LAB — I-STAT CG4 LACTIC ACID, ED: Lactic Acid, Venous: 2.3 mmol/L — ABNORMAL HIGH (ref 0.5–2.2)

## 2014-07-09 MED ORDER — LORAZEPAM 1 MG PO TABS
1.0000 mg | ORAL_TABLET | Freq: Four times a day (QID) | ORAL | Status: DC | PRN
  Administered 2014-07-09: 1 mg via ORAL
  Filled 2014-07-09: qty 1

## 2014-07-09 MED ORDER — VITAMIN B-1 100 MG PO TABS: 100.0000 mg | ORAL_TABLET | Freq: Every day | ORAL | Status: DC

## 2014-07-09 MED ORDER — POTASSIUM CHLORIDE CRYS ER 20 MEQ PO TBCR
40.0000 meq | EXTENDED_RELEASE_TABLET | Freq: Once | ORAL | Status: AC
  Administered 2014-07-09: 40 meq via ORAL
  Filled 2014-07-09: qty 2

## 2014-07-09 MED ORDER — LORAZEPAM 2 MG/ML IJ SOLN: 1.0000 mg | Freq: Four times a day (QID) | INTRAMUSCULAR | Status: DC | PRN

## 2014-07-09 MED ORDER — LOPERAMIDE HCL 2 MG PO CAPS
2.0000 mg | ORAL_CAPSULE | ORAL | Status: DC | PRN
  Administered 2014-07-09: 2 mg via ORAL
  Filled 2014-07-09: qty 1

## 2014-07-09 MED ORDER — LORAZEPAM 0.5 MG PO TABS: 0.5000 mg | ORAL_TABLET | Freq: Three times a day (TID) | ORAL | Status: DC | PRN

## 2014-07-09 MED ORDER — FOLIC ACID 1 MG PO TABS: 1.0000 mg | ORAL_TABLET | Freq: Every day | ORAL | Status: DC

## 2014-07-09 MED ORDER — SODIUM CHLORIDE 0.9 % IV SOLN
INTRAVENOUS | Status: AC
  Administered 2014-07-09: via INTRAVENOUS

## 2014-07-09 MED ORDER — IOHEXOL 300 MG/ML  SOLN
50.0000 mL | Freq: Once | INTRAMUSCULAR | Status: AC | PRN
  Administered 2014-07-09: 50 mL via ORAL

## 2014-07-09 MED ORDER — THIAMINE HCL 100 MG/ML IJ SOLN
Freq: Once | INTRAVENOUS | Status: AC
  Administered 2014-07-09: 04:00:00 via INTRAVENOUS
  Filled 2014-07-09 (×2): qty 1000

## 2014-07-09 MED ORDER — LORAZEPAM 2 MG/ML IJ SOLN: 0.0000 mg | Freq: Two times a day (BID) | INTRAMUSCULAR | Status: DC

## 2014-07-09 MED ORDER — POTASSIUM CHLORIDE 10 MEQ/100ML IV SOLN
10.0000 meq | Freq: Once | INTRAVENOUS | Status: AC
  Administered 2014-07-09: 10 meq via INTRAVENOUS
  Filled 2014-07-09: qty 100

## 2014-07-09 MED ORDER — IOHEXOL 300 MG/ML  SOLN
100.0000 mL | Freq: Once | INTRAMUSCULAR | Status: AC | PRN
  Administered 2014-07-09: 100 mL via INTRAVENOUS

## 2014-07-09 MED ORDER — ADULT MULTIVITAMIN W/MINERALS CH: 1.0000 | ORAL_TABLET | Freq: Every day | ORAL | Status: DC

## 2014-07-09 MED ORDER — ONDANSETRON HCL 4 MG/2ML IJ SOLN
4.0000 mg | Freq: Three times a day (TID) | INTRAMUSCULAR | Status: AC | PRN
  Administered 2014-07-09: 4 mg via INTRAVENOUS
  Filled 2014-07-09: qty 2

## 2014-07-09 MED ORDER — ENOXAPARIN SODIUM 40 MG/0.4ML ~~LOC~~ SOLN
40.0000 mg | SUBCUTANEOUS | Status: DC
  Filled 2014-07-09: qty 0.4

## 2014-07-09 MED ORDER — LORAZEPAM 2 MG/ML IJ SOLN
0.0000 mg | Freq: Four times a day (QID) | INTRAMUSCULAR | Status: DC
  Administered 2014-07-09: 1 mg via INTRAVENOUS
  Filled 2014-07-09: qty 1

## 2014-07-09 MED ORDER — ENSURE COMPLETE PO LIQD: 237.0000 mL | ORAL | Status: DC

## 2014-07-09 MED ORDER — THIAMINE HCL 100 MG/ML IJ SOLN: 100.0000 mg | Freq: Every day | INTRAMUSCULAR | Status: DC

## 2014-07-09 MED ORDER — ONDANSETRON HCL 4 MG PO TABS: 4.0000 mg | ORAL_TABLET | Freq: Three times a day (TID) | ORAL | Status: DC | PRN

## 2014-07-09 NOTE — ED Notes (Signed)
Pt to have CT Abdomen Pelvis with Contrast prior to transferring pt to Floor Unit.

## 2014-07-09 NOTE — Progress Notes (Signed)
Pt transported to floor via stretcher. Pt ambulated to bed with little assist. VSS at this time. No distress noted. Will continue to monitor pt closely. [name] I

## 2014-07-09 NOTE — Progress Notes (Signed)
UR completed 

## 2014-07-09 NOTE — Discharge Summary (Signed)
Physician Discharge Summary  [name] SAY:[id number] DOB: 06-14-1976 DOA: 07/08/2014  PCP: Colin Benton R., DO  Admit date: 07/08/2014 Discharge date: 07/09/2014  Recommendations for Outpatient Follow-up:  1. Pt declined to have SW assistance with alcohol abuse. She is medically competent to make this decision. 2. I have reviewed findings on CT abd and we discussed plan of care in regards to peritoneal carcinomatosis. I made her an appt with Dr. Denman George of gyn onc 07/21/2014 per Dr. Hans Eden recommendations.   Discharge Diagnoses:  Active Problems:   CIS (carcinoma in situ of cervix)   Anxiety   Liver function study, abnormal   Lactic acid acidosis   Alcohol withdrawal   Nausea and vomiting in adult   Alcoholism /alcohol abuse   Opiate withdrawal   Abnormal computed tomography angiography (CTA) of abdomen and pelvis    Discharge Condition: stable; pt insists on going home today  Diet recommendation: as tolerated   History of present illness:  38 yr old woman with past medical history of alcohol abuse, cervical dysplasia (never had hysterectomy despite recommendations of Dr. Raphael Gibney GYN in 2013) who presented for alcohol detox. Her alcohol level was 86 on admission. In addition her Ct abd showed possible peritoneal carcinomatosis.  Hospital Course:   Active Problems: Acute alcohol intoxication - feels better now - tolerates regular diet - wishes to go home; prescriptions provided for MVI, thiamine, folic acid, xanax PRN Possible peritoneal carcinomatosis - GYN onc appt sch 07/21/2014 at 12 pm  Signed:  Leisa Lenz, MD  [facility] 07/09/2014, 10:44 AM  Pager #: [phone]   Discharge Exam: Filed Vitals:   07/09/14 0431  BP: 101/70  Pulse: 113  Temp:   Resp:    Filed Vitals:   07/09/14 0202 07/09/14 0427 07/09/14 0429 07/09/14 0431  BP: 103/59 99/67 110/75 101/70  Pulse: 72 80 82 113  Temp: 98.2 F (36.8 C) 97.6 F (36.4 C)    TempSrc: Oral  Oral    Resp: 18 16    Height: 5' (1.524 m)     Weight: 60.011 kg (132 lb 4.8 oz)     SpO2: 100% 100%      General: Pt is alert, follows commands appropriately, not in acute distress Cardiovascular: Regular rate and rhythm, S1/S2 +, no murmurs Respiratory: Clear to auscultation bilaterally, no wheezing, no crackles, no rhonchi Abdominal: Soft, non tender, non distended, bowel sounds +, no guarding Extremities: no edema, no cyanosis, pulses palpable bilaterally DP and PT Neuro: Grossly nonfocal  Discharge Instructions  Discharge Instructions   Call MD for:  difficulty breathing, headache or visual disturbances    Complete by:  As directed      Call MD for:  persistant dizziness or light-headedness    Complete by:  As directed      Call MD for:  persistant nausea and vomiting    Complete by:  As directed      Call MD for:  severe uncontrolled pain    Complete by:  As directed      Diet - low sodium heart healthy    Complete by:  As directed      Increase activity slowly    Complete by:  As directed             Medication List         folic acid 1 MG tablet  Commonly known as:  FOLVITE  Take 1 tablet (1 mg total) by mouth daily.  LORazepam 0.5 MG tablet  Commonly known as:  ATIVAN  Take 1 tablet (0.5 mg total) by mouth every 8 (eight) hours as needed for anxiety.     multivitamin with minerals Tabs tablet  Take 1 tablet by mouth daily.     ondansetron 4 MG tablet  Commonly known as:  ZOFRAN  Take 1 tablet (4 mg total) by mouth every 8 (eight) hours as needed for nausea or vomiting.     thiamine 100 MG tablet  Commonly known as:  VITAMIN B-1  Take 1 tablet (100 mg total) by mouth daily.           Follow-up Information   Follow up with Colin Benton R., DO. Schedule an appointment as soon as possible for a visit in 1 week. (Follow up appt after recent hospitalization)    Specialty:  Family Medicine   Contact information:   [address]  [place] [id number] [phone]        The results of significant diagnostics from this hospitalization (including imaging, microbiology, ancillary and laboratory) are listed below for reference.    Significant Diagnostic Studies: Ct Abdomen Pelvis W Contrast  07/09/2014   CLINICAL DATA:  Elevated lactic acid levels with loss of appetite for 3 weeks. History of alcohol and methadone withdrawal. History of cervical cancer.  EXAM: CT ABDOMEN AND PELVIS WITH CONTRAST  TECHNIQUE: Multidetector CT imaging of the abdomen and pelvis was performed using the standard protocol following bolus administration of intravenous contrast.  CONTRAST:  128mL OMNIPAQUE IOHEXOL 300 MG/ML SOLN, 75mL OMNIPAQUE IOHEXOL 300 MG/ML SOLN  COMPARISON:  Chest CT 01/22/2010.  FINDINGS: Lung bases: Clear. No significant pleural or pericardial effusion.  Liver: There is severe hepatic steatosis with moderate hepatomegaly. No focal lesions are identified. The sensitivity of CT for focal lesions in the setting of severe steatosis is limited.  Gallbladder/Biliary system: No evidence of gallstones, gallbladder wall thickening or biliary dilatation.  Pancreas: Unremarkable.  Spleen: Normal in size without focal abnormality.  Adrenal glands: No adrenal mass.  Kidneys/Ureters/Bladder: Both kidneys appear normal. There is no evidence of renal mass, hydronephrosis or urinary tract calculus. The bladder appears normal.  Bowel: The stomach and abdominal portions of the small bowel and colon appear normal. Within the pelvis, There is mild diffuse wall thickening of the sigmoid colon and rectum, possibly related to prior radiation therapy. No focal mucosal lesion identified. The appendix appears normal. There is no evidence of bowel obstruction.  Peritoneum: A small amount of pelvic ascites is noted. There are multiple peritoneal nodules worrisome for peritoneal carcinomatosis. Within the left upper quadrant, there is a 1.8 cm nodule adjacent to the left  adrenal gland on image 19. There is a 12 mm nodule near the splenic flexure on image 39. Mild omental caking is noted.  Lymph Nodes: Small lymph nodes within the retroperitoneum and porta hepatis are not significantly enlarged.  Vascular Structures: No significant vascular findings.  Reproductive Organs: The uterus and ovaries appear normal.  Abdominal wall: Small umbilical hernia containing only fat.  Musculoskeletal: No acute or significant osseus findings.  IMPRESSION: 1. Omental caking and peritoneal nodularity highly worrisome for peritoneal carcinomatosis. There is only minimal ascites. 2. Severe hepatic steatosis and hepatomegaly. 3. No evidence of ureteral or bowel obstruction. 4. Rectal and sigmoid colon wall thickening in the pelvis, possibly related to prior pelvic radiation.   Electronically Signed   By: Camie Patience M.D.   On: 07/09/2014 02:01    Microbiology: Recent  Results (from the past 240 hour(s))  WET PREP, GENITAL     Status: Abnormal   Collection Time    07/08/14  9:00 PM      Result Value Ref Range Status   Yeast Wet Prep HPF POC NONE SEEN  NONE SEEN Final   Trich, Wet Prep NONE SEEN  NONE SEEN Final   Clue Cells Wet Prep HPF POC NONE SEEN  NONE SEEN Final   WBC, Wet Prep HPF POC FEW (*) NONE SEEN Final  GC/CHLAMYDIA PROBE AMP     Status: None   Collection Time    07/08/14  9:00 PM      Result Value Ref Range Status   CT Probe RNA NEGATIVE  NEGATIVE Final   GC Probe RNA NEGATIVE  NEGATIVE Final   Comment: (NOTE)                                                                                               **Normal Reference Range: Negative**          Assay performed using the Gen-Probe APTIMA COMBO2 (R) Assay.     Acceptable specimen types for this assay include APTIMA Swabs (Unisex,     endocervical, urethral, or vaginal), first void urine, and ThinPrep     liquid based cytology samples.     Performed at [facility]: Basic Metabolic  Panel:  Recent Labs Lab 07/08/14 1719 07/09/14 0433  NA 134* 136*  K 3.2* 3.5*  CL 85* 94*  CO2 20 23  GLUCOSE 86 105*  BUN 5* 4*  CREATININE 0.53 0.50  CALCIUM 9.2 7.9*  MG  --  1.7   Liver Function Tests:  Recent Labs Lab 07/08/14 1719 07/09/14 0433  AST 372* 283*  ALT 123* 101*  ALKPHOS 126* 103  BILITOT 1.0 1.1  PROT 7.3 6.1  ALBUMIN 3.5 2.9*    Recent Labs Lab 07/08/14 2107  LIPASE 29   No results found for this basename: AMMONIA,  in the last 168 hours CBC:  Recent Labs Lab 07/08/14 1719 07/09/14 0433  WBC 7.7 4.9  NEUTROABS 5.4 3.1  HGB 12.7 11.4*  HCT 37.2 32.7*  MCV 108.1* 106.2*  PLT 173 152   Cardiac Enzymes: No results found for this basename: CKTOTAL, CKMB, CKMBINDEX, TROPONINI,  in the last 168 hours BNP: BNP (last 3 results) No results found for this basename: PROBNP,  in the last 8760 hours CBG: No results found for this basename: GLUCAP,  in the last 168 hours  Time coordinating discharge: Over 30 minutes

## 2014-07-09 NOTE — H&P (Signed)
[name] is an 38 y.o. female.   Chief Complaint: Weakness, nausea and vomiting. 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  Three weeks ago she undertook to detox herself at home by cutting back on alcohol and the opiate drugs.  She states that she has had shakes and delerium and intractable nausea and vomiting.  She states that she has been vomiting up everything that she has tried to take in .  She has not been able to keep anything down and today she was too weak to walk to the bathroom.  She states that she used to drink 2/5 of hard liquor a day.  Since she has been detoxing she has cut back to just 1/5 a day.  She arrived in the ED with an ETOH level of 86.  Past Medical History  Diagnosis Date  . Anxiety   . Cervical intraepithelial neoplasia III   . 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 H/O varicella   . Abnormal Pap smear     Age 70  . Yeast infection   . Bacterial infection   . Trichomonas   . HPV (human papilloma virus) anogenital infection   . 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     Age 1  . 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 Hx: UTI (urinary tract infection)     Back pain  . Irregular menstrual cycle   . H/O fatigue 2009  . Palpitations 2010  . Headache(784.0)   . Condyloma 2011  . Hx of dizziness 09/20/2011  . Pelvic pain in female 12/05/11  . IV drug user   . Alcohol abuse     Past Surgical History  Procedure Laterality Date  . Conization cervix      x 3  . A*wisdom teeth ext    . Svd      x 1  . Cervical conization w/bx  11/30/2011    Procedure: CONIZATION CERVIX WITH BIOPSY;  Surgeon: [name], MD;  Location: [facility];  Service: Gynecology;  Laterality: N/A;  . Dilation and curettage of uterus  11/30/2011    Procedure: DILATATION AND CURETTAGE;  Surgeon: [name], MD;  Location: [facility];  Service: Gynecology;  Laterality:  N/A;  . Cervical conization w/bx  03/16/2012    Procedure: CONIZATION CERVIX WITH BIOPSY;  Surgeon: Ena Dawley, MD;  Location: [facility];  Service: Gynecology;  Laterality: N/A;    Family History  Problem Relation Age of Onset  . Cervical cancer Mother   . Breast cancer Mother   . Cancer Mother     Breast & cervical   . Stroke Maternal Grandmother   . COPD Maternal Grandfather     Emphysema  . Hypertension Paternal Grandfather   . Stroke Paternal Grandfather    Social History:  reports that she has been smoking Cigarettes.  She has a 20 pack-year smoking history. She has never used smokeless tobacco. She reports that she drinks alcohol. She reports that she uses illicit drugs (Heroin).  Allergies: No Known Allergies  Medications Prior to Admission  Medication Sig Dispense Refill  . Multiple Vitamin (MULTIVITAMIN WITH MINERALS) TABS tablet Take 1 tablet by mouth daily.        Results for orders placed during the hospital encounter of 07/08/14 (from the past 48 hour(s))  CBC WITH DIFFERENTIAL     Status: Abnormal  Collection Time    07/08/14  5:19 PM      Result Value Ref Range   WBC 7.7  4.0 - 10.5 K/uL   RBC 3.44 (*) 3.87 - 5.11 MIL/uL   Hemoglobin 12.7  12.0 - 15.0 g/dL   HCT 37.2  36.0 - 46.0 %   MCV 108.1 (*) 78.0 - 100.0 fL   MCH 36.9 (*) 26.0 - 34.0 pg   MCHC 34.1  30.0 - 36.0 g/dL   RDW 13.0  11.5 - 15.5 %   Platelets 173  150 - 400 K/uL   Neutrophils Relative % 69  43 - 77 %   Neutro Abs 5.4  1.7 - 7.7 K/uL   Lymphocytes Relative 20  12 - 46 %   Lymphs Abs 1.5  0.7 - 4.0 K/uL   Monocytes Relative 11  3 - 12 %   Monocytes Absolute 0.8  0.1 - 1.0 K/uL   Eosinophils Relative 0  0 - 5 %   Eosinophils Absolute 0.0  0.0 - 0.7 K/uL   Basophils Relative 0  0 - 1 %   Basophils Absolute 0.0  0.0 - 0.1 K/uL  COMPREHENSIVE METABOLIC PANEL     Status: Abnormal   Collection Time    07/08/14  5:19 PM      Result Value Ref Range   Sodium 134 (*) 137 - 147 mEq/L    Potassium 3.2 (*) 3.7 - 5.3 mEq/L   Chloride 85 (*) 96 - 112 mEq/L   CO2 20  19 - 32 mEq/L   Glucose, Bld 86  70 - 99 mg/dL   BUN 5 (*) 6 - 23 mg/dL   Creatinine, Ser 0.53  0.50 - 1.10 mg/dL   Calcium 9.2  8.4 - 10.5 mg/dL   Total Protein 7.3  6.0 - 8.3 g/dL   Albumin 3.5  3.5 - 5.2 g/dL   AST 372 (*) 0 - 37 U/L   ALT 123 (*) 0 - 35 U/L   Alkaline Phosphatase 126 (*) 39 - 117 U/L   Total Bilirubin 1.0  0.3 - 1.2 mg/dL   GFR calc non Af Amer >90  >90 mL/min   GFR calc Af Amer >90  >90 mL/min   Comment: (NOTE)     The eGFR has been calculated using the CKD EPI equation.     This calculation has not been validated in all clinical situations.     eGFR's persistently <90 mL/min signify possible Chronic Kidney     Disease.   Anion gap 29 (*) 5 - 15  ETHANOL     Status: Abnormal   Collection Time    07/08/14  5:19 PM      Result Value Ref Range   Alcohol, Ethyl (B) 86 (*) 0 - 11 mg/dL   Comment:            LOWEST DETECTABLE LIMIT FOR     SERUM ALCOHOL IS 11 mg/dL     FOR MEDICAL PURPOSES ONLY  I-STAT CG4 LACTIC ACID, ED     Status: Abnormal   Collection Time    07/08/14  8:55 PM      Result Value Ref Range   Lactic Acid, Venous 4.96 (*) 0.5 - 2.2 mmol/L  WET PREP, GENITAL     Status: Abnormal   Collection Time    07/08/14  9:00 PM      Result Value Ref Range   Yeast Wet Prep HPF POC NONE SEEN  NONE SEEN  Trich, Wet Prep NONE SEEN  NONE SEEN   Clue Cells Wet Prep HPF POC NONE SEEN  NONE SEEN   WBC, Wet Prep HPF POC FEW (*) NONE SEEN  LIPASE, BLOOD     Status: None   Collection Time    07/08/14  9:07 PM      Result Value Ref Range   Lipase 29  11 - 59 U/L  BLOOD GAS, VENOUS     Status: Abnormal   Collection Time    07/08/14  9:07 PM      Result Value Ref Range   FIO2 0.21     pH, Ven 7.471 (*) 7.250 - 7.300   pCO2, Ven 28.9 (*) 45.0 - 50.0 mmHg   pO2, Ven 52.9 (*) 30.0 - 45.0 mmHg   Bicarbonate 20.8  20.0 - 24.0 mEq/L   TCO2 18.5  0 - 100 mmol/L   Acid-base deficit  1.4  0.0 - 2.0 mmol/L   O2 Saturation 85.9     Patient temperature 98.7     Collection site VEIN     Drawn by COLLECTED BY LABORATORY     Sample type VENOUS    URINE RAPID DRUG SCREEN (HOSP PERFORMED)     Status: None   Collection Time    07/08/14  9:55 PM      Result Value Ref Range   Opiates NONE DETECTED  NONE DETECTED   Cocaine NONE DETECTED  NONE DETECTED   Benzodiazepines NONE DETECTED  NONE DETECTED   Amphetamines NONE DETECTED  NONE DETECTED   Tetrahydrocannabinol NONE DETECTED  NONE DETECTED   Barbiturates NONE DETECTED  NONE DETECTED   Comment:            DRUG SCREEN FOR MEDICAL PURPOSES     ONLY.  IF CONFIRMATION IS NEEDED     FOR ANY PURPOSE, NOTIFY LAB     WITHIN 5 DAYS.                LOWEST DETECTABLE LIMITS     FOR URINE DRUG SCREEN     Drug Class       Cutoff (ng/mL)     Amphetamine      1000     Barbiturate      200     Benzodiazepine   774     Tricyclics       128     Opiates          300     Cocaine          300     THC              50  URINALYSIS, ROUTINE W REFLEX MICROSCOPIC     Status: Abnormal   Collection Time    07/08/14  9:55 PM      Result Value Ref Range   Color, Urine YELLOW  YELLOW   APPearance CLOUDY (*) CLEAR   Specific Gravity, Urine 1.018  1.005 - 1.030   pH 5.5  5.0 - 8.0   Glucose, UA NEGATIVE  NEGATIVE mg/dL   Hgb urine dipstick NEGATIVE  NEGATIVE   Bilirubin Urine NEGATIVE  NEGATIVE   Ketones, ur >80 (*) NEGATIVE mg/dL   Protein, ur NEGATIVE  NEGATIVE mg/dL   Urobilinogen, UA 0.2  0.0 - 1.0 mg/dL   Nitrite NEGATIVE  NEGATIVE   Leukocytes, UA NEGATIVE  NEGATIVE   Comment: MICROSCOPIC NOT DONE ON URINES WITH NEGATIVE PROTEIN, BLOOD, LEUKOCYTES, NITRITE, OR GLUCOSE <1000  mg/dL.  POC URINE PREG, ED     Status: None   Collection Time    07/08/14 10:09 PM      Result Value Ref Range   Preg Test, Ur NEGATIVE  NEGATIVE   Comment:            THE SENSITIVITY OF THIS     METHODOLOGY IS >12 mIU/mL  SALICYLATE LEVEL     Status:  Abnormal   Collection Time    07/08/14 10:40 PM      Result Value Ref Range   Salicylate Lvl <7.5 (*) 2.8 - 20.0 mg/dL  I-STAT CG4 LACTIC ACID, ED     Status: Abnormal   Collection Time    07/09/14 12:28 AM      Result Value Ref Range   Lactic Acid, Venous 2.30 (*) 0.5 - 2.2 mmol/L   Ct Abdomen Pelvis W Contrast  07/09/2014   CLINICAL DATA:  Elevated lactic acid levels with loss of appetite for 3 weeks. History of alcohol and methadone withdrawal. History of cervical cancer.  EXAM: CT ABDOMEN AND PELVIS WITH CONTRAST  TECHNIQUE: Multidetector CT imaging of the abdomen and pelvis was performed using the standard protocol following bolus administration of intravenous contrast.  CONTRAST:  165m OMNIPAQUE IOHEXOL 300 MG/ML SOLN, 52mOMNIPAQUE IOHEXOL 300 MG/ML SOLN  COMPARISON:  Chest CT 01/22/2010.  FINDINGS: Lung bases: Clear. No significant pleural or pericardial effusion.  Liver: There is severe hepatic steatosis with moderate hepatomegaly. No focal lesions are identified. The sensitivity of CT for focal lesions in the setting of severe steatosis is limited.  Gallbladder/Biliary system: No evidence of gallstones, gallbladder wall thickening or biliary dilatation.  Pancreas: Unremarkable.  Spleen: Normal in size without focal abnormality.  Adrenal glands: No adrenal mass.  Kidneys/Ureters/Bladder: Both kidneys appear normal. There is no evidence of renal mass, hydronephrosis or urinary tract calculus. The bladder appears normal.  Bowel: The stomach and abdominal portions of the small bowel and colon appear normal. Within the pelvis, There is mild diffuse wall thickening of the sigmoid colon and rectum, possibly related to prior radiation therapy. No focal mucosal lesion identified. The appendix appears normal. There is no evidence of bowel obstruction.  Peritoneum: A small amount of pelvic ascites is noted. There are multiple peritoneal nodules worrisome for peritoneal carcinomatosis. Within the left  upper quadrant, there is a 1.8 cm nodule adjacent to the left adrenal gland on image 19. There is a 12 mm nodule near the splenic flexure on image 39. Mild omental caking is noted.  Lymph Nodes: Small lymph nodes within the retroperitoneum and porta hepatis are not significantly enlarged.  Vascular Structures: No significant vascular findings.  Reproductive Organs: The uterus and ovaries appear normal.  Abdominal wall: Small umbilical hernia containing only fat.  Musculoskeletal: No acute or significant osseus findings.  IMPRESSION: 1. Omental caking and peritoneal nodularity highly worrisome for peritoneal carcinomatosis. There is only minimal ascites. 2. Severe hepatic steatosis and hepatomegaly. 3. No evidence of ureteral or bowel obstruction. 4. Rectal and sigmoid colon wall thickening in the pelvis, possibly related to prior pelvic radiation.   Electronically Signed   By: BiCamie Patience.D.   On: 07/09/2014 02:01    Review of Systems  Constitutional: Positive for chills and malaise/fatigue. Negative for fever, weight loss and diaphoresis.  HENT: Positive for sore throat. Negative for congestion.   Eyes: Negative for blurred vision, double vision, discharge and redness.  Respiratory: Positive for cough. Negative for hemoptysis, sputum production, shortness  of breath, wheezing and stridor.   Cardiovascular: Positive for PND. Negative for chest pain, palpitations, orthopnea and leg swelling.  Gastrointestinal: Positive for nausea, vomiting and abdominal pain. Negative for heartburn, diarrhea, constipation, blood in stool and melena.  Genitourinary: Negative for dysuria, urgency, frequency, hematuria and flank pain.  Skin: Negative for itching and rash.  Neurological: Positive for dizziness and weakness. Negative for tingling, sensory change, speech change, focal weakness, seizures, loss of consciousness and headaches.  Endo/Heme/Allergies: Negative for polydipsia. Does not bruise/bleed easily.   Psychiatric/Behavioral: Positive for hallucinations and substance abuse. Negative for depression and suicidal ideas. The patient is nervous/anxious and has insomnia.     Blood pressure 103/59, pulse 72, temperature 98.2 F (36.8 C), temperature source Oral, resp. rate 18, height 5' (1.524 m), weight 60.011 kg (132 lb 4.8 oz), SpO2 100.00%. Physical Exam  Constitutional: She is oriented to person, place, and time. She appears well-developed and well-nourished.  HENT:  Head: Normocephalic and atraumatic.  Mouth/Throat: No oropharyngeal exudate.  Eyes: Conjunctivae and EOM are normal. Pupils are equal, round, and reactive to light. Right eye exhibits no discharge. Left eye exhibits no discharge. No scleral icterus.  Neck: Normal range of motion. Neck supple. JVD present. No tracheal deviation present. No thyromegaly present.  Cardiovascular: Normal rate, regular rhythm, normal heart sounds and intact distal pulses.  Exam reveals no gallop and no friction rub.   No murmur heard. Respiratory: No stridor. No respiratory distress. She has no wheezes. She has no rales. She exhibits no tenderness.  GI: Soft. Bowel sounds are normal. She exhibits no distension and no mass. There is no tenderness. There is no rebound and no guarding.  Musculoskeletal: Normal range of motion. She exhibits no edema and no tenderness.  Lymphadenopathy:    She has no cervical adenopathy.  Neurological: She is alert and oriented to person, place, and time. She has normal reflexes. She displays normal reflexes. No cranial nerve deficit. She exhibits normal muscle tone.  Skin: Skin is warm.  Psychiatric: She has a normal mood and affect. Her behavior is normal. Judgment and thought content normal.     Assessment/Plan 1. Lactic acidosis - Etiology unknown.  Possibly due to patient's intractable nausea and vomiting, or possibly related to the peritoneal nodularity and thickening of her sigmoid bowel  Pt denies any seizure  activity during her alcohol withdrawals that may have led to the abnormality, but she also admit to hallucinations. 2. ETOH and Opiate withdrawal. Pt will be placed on a CIWA protocol. 3. Intractable nausea and vomiting - antiemetics, IV fluids. 4. Abnormal CT abdomen and pelvic with possible peritoneal carcinomatosis and omental caking as well as thickening of the wall of the sigmoid colon 5. History of CIS of cervix.  Pt states that she was supposed to undergo a hysterectomy 4 years ago for this problem after she had already undergone 4 cervical procedures, but she never did.  Khushi Zupko 07/09/2014, 2:43 AM

## 2014-07-09 NOTE — Progress Notes (Signed)
INITIAL NUTRITION ASSESSMENT  DOCUMENTATION CODES Per approved criteria  -Non-severe (moderate) malnutrition in the context of acute illness or injury  Pt meets criteria for moderate MALNUTRITION in the context of acute illness as evidenced by PO intake <75% for > 7 days, approximately 5% body weight loss in < one month   INTERVENTION: -Recommend Ensure Complete once daily -Will continue to monitor  NUTRITION DIAGNOSIS: Inadequate oral intake related to nausea/vomiting as evidenced by decreased PO intake, 5 lbs weight loss.   Goal: Pt to meet >/= 90% of their estimated nutrition needs    Monitor:  Total protein/energy intake,labs, weights, GI profile  Reason for Assessment: MST  38 y.o. 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 Three weeks ago she undertook to detox herself at home by cutting back on alcohol and the opiate drugs. She states that she has had shakes and delerium and intractable nausea and vomiting. She states that she has been vomiting up everything that she has tried to take in   -Pt reported unintentional wt loss of 5-8 lbs in past 3 weeks d/t ongoing nausea and vomiting. Had tried to follow bland diet , but was unable to tolerate any solids and water/fluids also induced vomiting -Had tried to incorporate Ensure into diet. Will add one supplement daily for nutrient replenishment -Pt currently tolerating diet, is consuming soft/liquid foods (soups, yogurt, soda). Denied nausea/abd pain post meals -Mg WNL, K low -Elevated LFT, likely r/t to ETOH/opiate abuse. MD noted pt consuming 2/5 hard liquor/day, and consuming 1/5 per day during detox. Likely contributing to suboptimal nutritional status  Height: Ht Readings from Last 1 Encounters:  07/09/14 5' (1.524 m)    Weight: Wt Readings from Last 1 Encounters:  07/09/14 132 lb 4.8 oz  (60.011 kg)    Ideal Body Weight: 100 lb  % Ideal Body Weight: 132 lbs  Wt Readings from Last 10 Encounters:  07/09/14 132 lb 4.8 oz (60.011 kg)  04/08/14 137 lb 6.4 oz (62.324 kg)  02/20/14 141 lb (63.957 kg)  04/02/12 142 lb (64.411 kg)  03/18/12 145 lb 3.2 oz (65.862 kg)  02/27/12 140 lb (63.504 kg)  02/27/12 140 lb (63.504 kg)  01/02/12 140 lb 9.6 oz (63.776 kg)  12/01/11 144 lb (65.318 kg)  11/30/11 140 lb (63.504 kg)    Usual Body Weight: 137-140 lbs  % Usual Body Weight: 96%  BMI:  Body mass index is 25.84 kg/(m^2).  Estimated Nutritional Needs: Kcal: 1500-1700 Protein: 60-75 Fluid: >/= 1700 ml/daily  Skin: WDL  Diet Order: General  EDUCATION NEEDS: -No education needs identified at this time   Intake/Output Summary (Last 24 hours) at 07/09/14 1341 Last data filed at 07/09/14 1000  Gross per 24 hour  Intake 2695.83 ml  Output      0 ml  Net 2695.83 ml    Last BM: 7/20   Labs:   Recent Labs Lab 07/08/14 1719 07/09/14 0433  NA 134* 136*  K 3.2* 3.5*  CL 85* 94*  CO2 20 23  BUN 5* 4*  CREATININE 0.53 0.50  CALCIUM 9.2 7.9*  MG  --  1.7  GLUCOSE 86 105*    CBG (last 3)  No results found for this basename: GLUCAP,  in the last 72 hours  Scheduled Meds: . sodium chloride   Intravenous Once  . LORazepam  0-4 mg Intravenous Q6H  Followed by  . [START ON 07/11/2014] LORazepam  0-4 mg Intravenous Q12H    Continuous Infusions:   Past Medical History  Diagnosis Date  . Anxiety   . Cervical intraepithelial neoplasia III   . 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 H/O varicella   . Abnormal Pap smear     Age 38  . Yeast infection   . Bacterial infection   . Trichomonas   . HPV (human papilloma virus) anogenital infection   . 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     Age 89  . 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 Hx: UTI (urinary tract infection)     Back pain  . Irregular  menstrual cycle   . H/O fatigue 2009  . Palpitations 2010  . Headache(784.0)   . Condyloma 2011  . Hx of dizziness 09/20/2011  . Pelvic pain in female 12/05/11  . IV drug user   . Alcohol abuse     Past Surgical History  Procedure Laterality Date  . Conization cervix      x 3  . A*wisdom teeth ext    . Svd      x 1  . Cervical conization w/bx  11/30/2011    Procedure: CONIZATION CERVIX WITH BIOPSY;  Surgeon: [name], MD;  Location: [facility];  Service: Gynecology;  Laterality: N/A;  . Dilation and curettage of uterus  11/30/2011    Procedure: DILATATION AND CURETTAGE;  Surgeon: [name], MD;  Location: [facility];  Service: Gynecology;  Laterality: N/A;  . Cervical conization w/bx  03/16/2012    Procedure: CONIZATION CERVIX WITH BIOPSY;  Surgeon: Ena Dawley, MD;  Location: [facility];  Service: Gynecology;  Laterality: N/A;    Atlee Abide MS RD LDN Clinical Dietitian [phone]

## 2014-07-09 NOTE — ED Provider Notes (Signed)
Medical screening examination/treatment/procedure(s) were performed by non-physician practitioner and as supervising physician I was immediately available for consultation/collaboration.   Leota Jacobsen, MD 07/09/14 [phone]

## 2014-07-09 NOTE — Progress Notes (Signed)
Discharge instructions explained and prescriptions given. Patient verbalizes understanding of same. Stable on discharge.

## 2014-07-09 NOTE — Discharge Instructions (Signed)
Alcohol Intoxication °Alcohol intoxication occurs when you drink enough alcohol that it affects your ability to function. It can be mild or very severe. Drinking a lot of alcohol in a short time is called binge drinking. This can be very harmful. Drinking alcohol can also be more dangerous if you are taking medicines or other drugs. Some of the effects caused by alcohol may include: °· Loss of coordination. °· Changes in mood and behavior. °· Unclear thinking. °· Trouble talking (slurred speech). °· Throwing up (vomiting). °· Confusion. °· Slowed breathing. °· Twitching and shaking (seizures). °· Loss of consciousness. °HOME CARE °· Do not drive after drinking alcohol. °· Drink enough water and fluids to keep your pee (urine) clear or pale yellow. Avoid caffeine. °· Only take medicine as told by your doctor. °GET HELP IF: °· You throw up (vomit) many times. °· You do not feel better after a few days. °· You frequently have alcohol intoxication. Your doctor can help decide if you should see a substance use treatment counselor. °GET HELP RIGHT AWAY IF: °· You become shaky when you stop drinking. °· You have twitching and shaking. °· You throw up blood. It may look bright red or like coffee grounds. °· You notice blood in your poop (bowel movements). °· You become lightheaded or pass out (faint). °MAKE SURE YOU:  °· Understand these instructions. °· Will watch your condition. °· Will get help right away if you are not doing well or get worse. °Document Released: 05/23/2008 Document Revised: 08/07/2013 Document Reviewed: 05/10/2013 °[employer]® Patient Information ©2015 [employer]. This information is not intended to replace advice given to you by your health care provider. Make sure you discuss any questions you have with your health care provider. ° °

## 2014-07-21 ENCOUNTER — Other Ambulatory Visit (HOSPITAL_COMMUNITY)
Admission: RE | Admit: 2014-07-21 | Discharge: 2014-07-21 | Disposition: A | Discharge status: Home / Self Care | Payer: No Typology Code available for payment source | Source: Ambulatory Visit | Attending: Gynecologic Oncology | Admitting: Gynecologic Oncology

## 2014-07-21 ENCOUNTER — Encounter: Payer: Self-pay | Admitting: Gynecologic Oncology

## 2014-07-21 ENCOUNTER — Ambulatory Visit: Payer: No Typology Code available for payment source | Attending: Gynecologic Oncology | Admitting: Gynecologic Oncology

## 2014-07-21 ENCOUNTER — Encounter (INDEPENDENT_AMBULATORY_CARE_PROVIDER_SITE_OTHER): Payer: Self-pay

## 2014-07-21 ENCOUNTER — Ambulatory Visit: Payer: No Typology Code available for payment source

## 2014-07-21 VITALS — BP 128/83 | HR 93 | Temp 98.3°F | Resp 18 | Ht 61.18 in | Wt 129.7 lb

## 2014-07-21 DIAGNOSIS — Z01419 Encounter for gynecological examination (general) (routine) without abnormal findings: Secondary | ICD-10-CM | POA: Diagnosis present

## 2014-07-21 DIAGNOSIS — F1020 Alcohol dependence, uncomplicated: Secondary | ICD-10-CM

## 2014-07-21 DIAGNOSIS — R945 Abnormal results of liver function studies: Secondary | ICD-10-CM

## 2014-07-21 DIAGNOSIS — R188 Other ascites: Secondary | ICD-10-CM | POA: Insufficient documentation

## 2014-07-21 DIAGNOSIS — R109 Unspecified abdominal pain: Secondary | ICD-10-CM

## 2014-07-21 DIAGNOSIS — IMO0001 Reserved for inherently not codable concepts without codable children: Secondary | ICD-10-CM

## 2014-07-21 DIAGNOSIS — Z79899 Other long term (current) drug therapy: Secondary | ICD-10-CM | POA: Insufficient documentation

## 2014-07-21 DIAGNOSIS — F411 Generalized anxiety disorder: Secondary | ICD-10-CM | POA: Insufficient documentation

## 2014-07-21 DIAGNOSIS — C482 Malignant neoplasm of peritoneum, unspecified: Secondary | ICD-10-CM | POA: Insufficient documentation

## 2014-07-21 DIAGNOSIS — Z8541 Personal history of malignant neoplasm of cervix uteri: Secondary | ICD-10-CM | POA: Insufficient documentation

## 2014-07-21 DIAGNOSIS — D069 Carcinoma in situ of cervix, unspecified: Secondary | ICD-10-CM

## 2014-07-21 LAB — COMPREHENSIVE METABOLIC PANEL (CC13)
ALT: 103 U/L — ABNORMAL HIGH (ref 0–55)
AST: 301 U/L (ref 5–34)
Albumin: 3.2 g/dL — ABNORMAL LOW (ref 3.5–5.0)
Alkaline Phosphatase: 123 U/L (ref 40–150)
Anion Gap: 13 mEq/L — ABNORMAL HIGH (ref 3–11)
BUN: 4.6 mg/dL — ABNORMAL LOW (ref 7.0–26.0)
CO2: 25 mEq/L (ref 22–29)
Calcium: 8.9 mg/dL (ref 8.4–10.4)
Chloride: 105 mEq/L (ref 98–109)
Creatinine: 0.6 mg/dL (ref 0.6–1.1)
Glucose: 95 mg/dl (ref 70–140)
Potassium: 3.7 mEq/L (ref 3.5–5.1)
Sodium: 143 mEq/L (ref 136–145)
Total Bilirubin: 0.72 mg/dL (ref 0.20–1.20)
Total Protein: 7 g/dL (ref 6.4–8.3)

## 2014-07-21 LAB — PROTHROMBIN TIME
INR: 1.14 (ref <1.50)
Prothrombin Time: 14.6 seconds (ref 11.6–15.2)

## 2014-07-21 LAB — APTT: aPTT: 29 seconds (ref 24–37)

## 2014-07-21 MED ORDER — DIPHENOXYLATE-ATROPINE 2.5-0.025 MG PO TABS: 1.0000 | ORAL_TABLET | Freq: Four times a day (QID) | ORAL | Status: DC | PRN

## 2014-07-21 MED ORDER — OXYCODONE HCL 5 MG PO TABS: 5.0000 mg | ORAL_TABLET | ORAL | Status: DC | PRN

## 2014-07-21 NOTE — Progress Notes (Signed)
Consult Note: Gyn-Onc  Consult was requested by Dr. Elsworth Soho for the evaluation of [name] 38 y.o. female  CC:  Chief Complaint  Patient presents with  . peritoneal cancer    Black spots on organs in abdomen    Assessment/Plan:  [name]  is a 38 y.o.  year old who is seen in consultation at the request of Dr. 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  I do not feel strongly that [name] has an underlying primary peritoneal carcinoma. [name] has subtle findings on CT scan for peritoneal carcinomatosis and an alternative nonmalignant diagnosis should still be considered. She is very low-volume ascites, subtle omental nodularity, and no pelvic masses that I can identify.  It is possible that her CT findings are sequelae from benign ascites from acute alcohol-induced hepatitis and cirrhosis. I am going to assess liver function today with a comprehensive metabolic panel, PTT and INR. She admits to still drinking alcohol. It is entirely possible that these laboratory abnormalities a persistent, in which case further workup of her CT findings is going to be extremely limited. I would not want to subject her to a surgical intervention (e.g. laparoscopic guided biopsy) with such gross hepatomegaly, frankly abnormal LFTs, and coagulopathy. We will schedule her for an ultrasound guided paracentesis. If the volume of fluid identified on ultrasound this to low for definitive cytologic diagnosis, we will discuss IR guided omental biopsy, which is less morbid than a general anesthetic and laparoscopy in an active alcoholic with coagulopathy. I will also check tumor markers, CEA and CA 125, however that I discussed with the patient that these are nonspecific and a typically mildly elevated in patients with hepatitis and cirrhosis. However a normal CA 125 would be extremely  reassuring in this situation.  With respect to her cervical adenocarcinoma history, I see no frank evidence of high-grade dysplasia or invasive malignancy on today's examination. We will followup with her Pap smear result in triage accordingly using ASCCP guidelines.  I discussed with [name] that I would not intervene with cancer therapy while she is actively drinking, and she would not be a candidate to safely undergo either chemotherapy or surgical interventions for malignancy while she is actively drinking alcohol. Additionally I discussed that the definitive workup for an underlying cancer will be limited if she continues to actively drink alcohol. I offered her assistance with obtaining counseling or resources to aid in abstinence. 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  HPI: [name] is a 38 year old gravida 1 para 1 who is seen in consultation at the request of Dr. Charlies Rice for ascites and possible peritoneal carcinomatosis on CT scan. The patient is an active alcoholic with known hepatomegaly and transaminase elevation. It is not formally carry a diagnosis of alcoholic cirrhosis.   she sees [name] for a known history of abnormal LFTs (last appointment was 04/08/2014).  [name] has a remote history of adenocarcinoma in situ of the cervix. She saw my partner Dr. Marti Rice for this in 2013 prior to a cold knife conization procedure that revealed no residual adenocarcinoma in situ. Since that time she's had no followup of her history of high-grade dysplasia.  In 07/08/2014 [name] was seen here at [facility] in the emergency room for  abdominal pain. As part of workup of this abdominal pain she received a CT scan of the abdomen and pelvis with IV contrast. The most notable finding on CT scan was substantial, severe hepatic steatosis with  hepatomegaly. A small amount of pelvic ascites was noted. There were multiple peritoneal nodules worrisome for peritoneal carcinomatosis. Including a 12 mm nodule near the splenic flexure. A commented on mild omental caking. The uterus and ovaries appeared normal, no no pelvic masses seen. Laboratory evaluation during that admission showed elevated liver function tests. A coagulopathy profile was not obtained.  In the past 3 weeks [name] has commented on nausea and emesis, which is now the, diarrhea. She notes that her stools are pale yellow. She feels abdominal pain and tightness across the upper abdomen and epigastrium. She denies vaginal bleeding or abnormal discharge.  Interval History:  Her emesis has shifted to diarrhea she continues to have pain in request analgesics for this. She continues to drink alcohol and her last consume drink the patient was 2 days ago.  Current Meds:  Outpatient Encounter Prescriptions as of 07/21/2014  Medication Sig  . folic acid (FOLVITE) 1 MG tablet Take 1 tablet (1 mg total) by mouth daily.  Marland Kitchen LORazepam (ATIVAN) 0.5 MG tablet Take 1 tablet (0.5 mg total) by mouth every 8 (eight) hours as needed for anxiety.  . Multiple Vitamin (MULTIVITAMIN WITH MINERALS) TABS tablet Take 1 tablet by mouth daily.  . ondansetron (ZOFRAN) 4 MG tablet Take 1 tablet (4 mg total) by mouth every 8 (eight) hours as needed for nausea or vomiting.  . thiamine (VITAMIN B-1) 100 MG tablet Take 1 tablet (100 mg total) by mouth daily.  . diphenoxylate-atropine (LOMOTIL) 2.5-0.025 MG per tablet Take 1 tablet by mouth 4 (four) times daily as needed for diarrhea or loose stools.  Marland Kitchen oxyCODONE (OXY IR/ROXICODONE) 5 MG immediate release tablet Take 1 tablet (5 mg total) by mouth every 4 (four) hours as needed for severe pain.  . [DISCONTINUED] HYDROcodone-acetaminophen (NORCO/VICODIN) 5-325 MG per tablet     Allergy: No Known Allergies  Social Hx:   History   Social History  . Marital Status:  Married    Spouse Name: N/A    Number of Children: 1  . Years of Education: N/A   Occupational History  . unemployed    Social History Main Topics  . Smoking status: Current Every Day Smoker -- 1.00 packs/day for 23 years    Types: Cigarettes  . Smokeless tobacco: Current User  . Alcohol Use: Yes     Comment: socially; every other day after work   . 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 Sexual Activity: Yes    Birth Control/ Protection: Abstinence   Other Topics Concern  . Not on file   Social History Narrative   Work or School: clinical addiction specialist      Home Situation: lives with daughter and wife      Spiritual Beliefs: none      Lifestyle: no regular exercise; good diet                Past Surgical Hx:  Past Surgical History  Procedure Laterality Date  . Conization cervix      x 3  . A*wisdom teeth ext    . Svd      x 1  . Cervical conization w/bx  11/30/2011    Procedure: CONIZATION CERVIX WITH BIOPSY;  Surgeon: [name], MD;  Location: [facility];  Service: Gynecology;  Laterality: N/A;  . Dilation and curettage of uterus  11/30/2011    Procedure: DILATATION AND CURETTAGE;  Surgeon: [name], MD;  Location: [facility];  Service: Gynecology;  Laterality: N/A;  . Cervical conization w/bx  03/16/2012    Procedure: CONIZATION CERVIX WITH BIOPSY;  Surgeon: Ena Dawley, MD;  Location: [facility];  Service: Gynecology;  Laterality: N/A;    Past Medical Hx:  Past Medical History  Diagnosis Date  . Anxiety   . Cervical intraepithelial neoplasia III   . 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 H/O varicella   . Abnormal Pap smear     Age 22  . Yeast infection   . Bacterial infection   . Trichomonas   . HPV (human papilloma virus) anogenital infection   . 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     Age 6  . 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 Hx: UTI (urinary  tract infection)     Back pain  . Irregular menstrual cycle   . H/O fatigue 2009  . Palpitations 2010  . Headache(784.0)   . Condyloma 2011  . Hx of dizziness 09/20/2011  . Pelvic pain in female 12/05/11  . IV drug user   . Alcohol abuse     Past Gynecological History:  G1P1 SVDx1, Hx of AIS. CKC's x 4 (2 in the 90's, one in 2012, one in 2013 - negative for dysplasia). No periods in the past year.  No LMP recorded. Patient is not currently having periods (Reason: Other).  Family Hx:  Family History  Problem Relation Age of Onset  . Cervical cancer Mother   . Breast cancer Mother   . Cancer Mother 37    Breast & cervical   . Stroke Maternal Grandmother   . COPD Maternal Grandfather     Emphysema  . Hypertension Paternal Grandfather   . Stroke Paternal Grandfather   . Cancer Father 34    prostate    Review of Systems:  Constitutional  Feels generally unwell. See HPI  ENT Normal appearing ears and nares bilaterally Skin/Breast  No rash, sores, jaundice, itching, dryness Cardiovascular  No chest pain, shortness of breath, or edema  Pulmonary  No cough or wheeze.  Gastro Intestinal  See HPI Genito Urinary  No frequency, urgency, dysuria, see HPI Musculo Skeletal  No myalgia, arthralgia, joint swelling or pain  Neurologic  No weakness, numbness, change in gait,  Psychology  No depression, anxiety, insomnia.   Vitals:  Blood pressure 128/83, pulse 93, temperature 98.3 F (36.8 C), temperature source Oral, resp. rate 18, height 5' 1.18" (1.554 m), weight 129 lb 11.2 oz (58.832 kg).  Physical Exam: WD in NAD Neck  Supple NROM, without any enlargements.  Lymph Node Survey No cervical supraclavicular or inguinal adenopathy Cardiovascular  Pulse normal rate, regularity and rhythm. S1 and S2 normal.  Lungs  Clear to auscultation bilateraly, without wheezes/crackles/rhonchi. Good air movement.  Skin  No rash/lesions/breakdown  Psychiatry  Alert and oriented to  person, place, and time  Abdomen  Normoactive bowel sounds, abdomen soft, non-tender and overweight without evidence of hernia. Liver palpable and percussable into left upper quadrant and to AIS on right. No fluid shift/thrill. Back No CVA tenderness Genito Urinary  Vulva/vagina: Normal external female genitalia.  No lesions. No discharge or bleeding.  Bladder/urethra:  No lesions or masses, well supported bladder  Vagina: normal.  Cervix: Flush with vagina, puckered consistent with prior procedures. No gross abnromalities other than cervical stenosis likely due to prior procedures.  Uterus:  Small, mobile, no parametrial involvement or nodularity.  Adnexa: no palpable masses. Rectal  Good tone, no masses no cul de sac nodularity.  Extremities  No bilateral cyanosis, clubbing or edema.   Donaciano Eva, MD   07/21/2014, 1:41 PM

## 2014-07-22 ENCOUNTER — Ambulatory Visit (HOSPITAL_COMMUNITY)
Admission: RE | Admit: 2014-07-22 | Discharge: 2014-07-22 | Disposition: A | Discharge status: Home / Self Care | Payer: No Typology Code available for payment source | Source: Ambulatory Visit | Attending: Gynecologic Oncology | Admitting: Gynecologic Oncology

## 2014-07-22 ENCOUNTER — Other Ambulatory Visit: Payer: Self-pay | Admitting: Gynecologic Oncology

## 2014-07-22 DIAGNOSIS — F1020 Alcohol dependence, uncomplicated: Secondary | ICD-10-CM | POA: Insufficient documentation

## 2014-07-22 DIAGNOSIS — IMO0001 Reserved for inherently not codable concepts without codable children: Secondary | ICD-10-CM

## 2014-07-22 DIAGNOSIS — R188 Other ascites: Secondary | ICD-10-CM

## 2014-07-22 DIAGNOSIS — R945 Abnormal results of liver function studies: Secondary | ICD-10-CM

## 2014-07-22 DIAGNOSIS — K700 Alcoholic fatty liver: Secondary | ICD-10-CM | POA: Insufficient documentation

## 2014-07-22 LAB — CEA: CEA: 3.7 ng/mL (ref 0.0–5.0)

## 2014-07-22 LAB — CA 125: CA 125: 17.5 U/mL (ref 0.0–30.2)

## 2014-07-22 NOTE — Progress Notes (Signed)
Patient ID: [name], female   DOB: Apr 28, 1976, 38 y.o.   MRN: [id number] Pt presented to [place] dept today for possible paracentesis. Limited US abd in all four quadrants reveals no sig ascites. Procedure was cancelled. Dr. Candida Peeling made aware.

## 2014-07-23 LAB — CYTOLOGY - PAP

## 2014-07-24 ENCOUNTER — Telehealth: Payer: Self-pay | Admitting: Gynecologic Oncology

## 2014-07-24 NOTE — Telephone Encounter (Signed)
Informed patient of normal CA 125, normal [place] (no ascites or nodules to biopsy). Given my low suspicion from the CT findings, I do not believe that [name] has peritoneal cancer. I do not feel that she needs any further workup of this matter.  With respect to her history of AIS ,she had normal cytology today. However, HPV status was not documented and endocervical sampling was not possible due to her cervical stenosis post CKC's. I do not believe there is an indicationat present for a hysterectomy (particularly because she is an active alcoholic with alcoholic hepatomegaly and elevated LFT's). I believe surgery would offer greater risk than benefit. I suggested that she seek active treatment with her hepatologist regarding her ETOH liver disease, and cease ETOH consumption/engage in rehab. Once this is resolved, consideration could be made for hysterectomy. Alternatively, in the meantime she should continue annual cytology with HPV screening with her gynecologist.  Donaciano Eva, MD

## 2014-09-04 ENCOUNTER — Emergency Department (HOSPITAL_COMMUNITY)
Admission: EM | Admit: 2014-09-04 | Discharge: 2014-09-04 | Disposition: A | Discharge status: Home / Self Care | Payer: No Typology Code available for payment source | Attending: Emergency Medicine | Admitting: Emergency Medicine

## 2014-09-04 ENCOUNTER — Encounter (HOSPITAL_COMMUNITY): Payer: Self-pay | Admitting: Emergency Medicine

## 2014-09-04 DIAGNOSIS — Z87440 Personal history of urinary (tract) infections: Secondary | ICD-10-CM | POA: Insufficient documentation

## 2014-09-04 DIAGNOSIS — F10939 Alcohol use, unspecified with withdrawal, unspecified: Secondary | ICD-10-CM | POA: Insufficient documentation

## 2014-09-04 DIAGNOSIS — R209 Unspecified disturbances of skin sensation: Secondary | ICD-10-CM | POA: Insufficient documentation

## 2014-09-04 DIAGNOSIS — F1010 Alcohol abuse, uncomplicated: Secondary | ICD-10-CM | POA: Insufficient documentation

## 2014-09-04 DIAGNOSIS — F10239 Alcohol dependence with withdrawal, unspecified: Secondary | ICD-10-CM | POA: Insufficient documentation

## 2014-09-04 DIAGNOSIS — F17200 Nicotine dependence, unspecified, uncomplicated: Secondary | ICD-10-CM | POA: Insufficient documentation

## 2014-09-04 DIAGNOSIS — Z862 Personal history of diseases of the blood and blood-forming organs and certain disorders involving the immune mechanism: Secondary | ICD-10-CM | POA: Insufficient documentation

## 2014-09-04 DIAGNOSIS — F10930 Alcohol use, unspecified with withdrawal, uncomplicated: Secondary | ICD-10-CM

## 2014-09-04 DIAGNOSIS — Z79899 Other long term (current) drug therapy: Secondary | ICD-10-CM | POA: Insufficient documentation

## 2014-09-04 DIAGNOSIS — F10230 Alcohol dependence with withdrawal, uncomplicated: Secondary | ICD-10-CM

## 2014-09-04 DIAGNOSIS — Z8659 Personal history of other mental and behavioral disorders: Secondary | ICD-10-CM | POA: Insufficient documentation

## 2014-09-04 DIAGNOSIS — Z8639 Personal history of other endocrine, nutritional and metabolic disease: Secondary | ICD-10-CM | POA: Insufficient documentation

## 2014-09-04 DIAGNOSIS — F1210 Cannabis abuse, uncomplicated: Secondary | ICD-10-CM | POA: Insufficient documentation

## 2014-09-04 DIAGNOSIS — Z8619 Personal history of other infectious and parasitic diseases: Secondary | ICD-10-CM | POA: Insufficient documentation

## 2014-09-04 LAB — RAPID URINE DRUG SCREEN, HOSP PERFORMED
Amphetamines: NOT DETECTED
Barbiturates: NOT DETECTED
Benzodiazepines: NOT DETECTED
Cocaine: NOT DETECTED
Opiates: NOT DETECTED
Tetrahydrocannabinol: POSITIVE — AB

## 2014-09-04 LAB — COMPREHENSIVE METABOLIC PANEL
ALT: 43 U/L — ABNORMAL HIGH (ref 0–35)
AST: 280 U/L — ABNORMAL HIGH (ref 0–37)
Albumin: 3.6 g/dL (ref 3.5–5.2)
Alkaline Phosphatase: 185 U/L — ABNORMAL HIGH (ref 39–117)
Anion gap: 17 — ABNORMAL HIGH (ref 5–15)
BUN: <3 mg/dL — ABNORMAL LOW (ref 6–23)
CO2: 25 mEq/L (ref 19–32)
Calcium: 8.9 mg/dL (ref 8.4–10.5)
Chloride: 101 mEq/L (ref 96–112)
Creatinine, Ser: 0.43 mg/dL — ABNORMAL LOW (ref 0.50–1.10)
GFR calc Af Amer: >90 mL/min (ref >90)
GFR calc non Af Amer: >90 mL/min (ref >90)
Glucose, Bld: 134 mg/dL — ABNORMAL HIGH (ref 70–99)
Potassium: 3.2 mEq/L — ABNORMAL LOW (ref 3.7–5.3)
Sodium: 143 mEq/L (ref 137–147)
Total Bilirubin: 1 mg/dL (ref 0.3–1.2)
Total Protein: 7.8 g/dL (ref 6.0–8.3)

## 2014-09-04 LAB — CBC
HCT: 34.7 % — ABNORMAL LOW (ref 36.0–46.0)
Hemoglobin: 11.7 g/dL — ABNORMAL LOW (ref 12.0–15.0)
MCH: 37.4 pg — ABNORMAL HIGH (ref 26.0–34.0)
MCHC: 33.7 g/dL (ref 30.0–36.0)
MCV: 110.9 fL — ABNORMAL HIGH (ref 78.0–100.0)
Platelets: 70 10*3/uL — ABNORMAL LOW (ref 150–400)
RBC: 3.13 MIL/uL — ABNORMAL LOW (ref 3.87–5.11)
RDW: 14.9 % (ref 11.5–15.5)
WBC: 6.8 10*3/uL (ref 4.0–10.5)

## 2014-09-04 LAB — ETHANOL: Alcohol, Ethyl (B): 72 mg/dL — ABNORMAL HIGH (ref 0–11)

## 2014-09-04 MED ORDER — LORAZEPAM 1 MG PO TABS: 1.0000 mg | ORAL_TABLET | Freq: Three times a day (TID) | ORAL | Status: DC | PRN

## 2014-09-04 MED ORDER — LORAZEPAM 1 MG PO TABS: 0.0000 mg | ORAL_TABLET | Freq: Two times a day (BID) | ORAL | Status: DC

## 2014-09-04 MED ORDER — ONDANSETRON HCL 4 MG/2ML IJ SOLN
4.0000 mg | Freq: Once | INTRAMUSCULAR | Status: AC
  Administered 2014-09-04: 4 mg via INTRAVENOUS
  Filled 2014-09-04: qty 2

## 2014-09-04 MED ORDER — SODIUM CHLORIDE 0.9 % IV BOLUS (SEPSIS)
1000.0000 mL | Freq: Once | INTRAVENOUS | Status: AC
  Administered 2014-09-04: 1000 mL via INTRAVENOUS

## 2014-09-04 MED ORDER — LORAZEPAM 1 MG PO TABS
0.0000 mg | ORAL_TABLET | Freq: Four times a day (QID) | ORAL | Status: DC
  Administered 2014-09-04: 1 mg via ORAL
  Filled 2014-09-04: qty 1

## 2014-09-04 MED ORDER — VITAMIN B-1 100 MG PO TABS
100.0000 mg | ORAL_TABLET | Freq: Every day | ORAL | Status: DC
  Administered 2014-09-04: 100 mg via ORAL
  Filled 2014-09-04: qty 1

## 2014-09-04 MED ORDER — THIAMINE HCL 100 MG/ML IJ SOLN
100.0000 mg | Freq: Every day | INTRAMUSCULAR | Status: DC
  Filled 2014-09-04: qty 2

## 2014-09-04 MED ORDER — ONDANSETRON 8 MG PO TBDP: 8.0000 mg | ORAL_TABLET | Freq: Once | ORAL | Status: DC

## 2014-09-04 NOTE — Discharge Instructions (Signed)
Alcohol Withdrawal °Anytime drug use is interfering with normal living activities it has become abuse. This includes problems with family and friends. Psychological dependence has developed when your mind tells you that the drug is needed. This is usually followed by physical dependence when a continuing increase of drugs are required to get the same feeling or "high." This is known as addiction or chemical dependency. A person's risk is much higher if there is a history of chemical dependency in the family. °Mild Withdrawal Following Stopping Alcohol, When Addiction or Chemical Dependency Has Developed °When a person has developed tolerance to alcohol, any sudden stopping of alcohol can cause uncomfortable physical symptoms. Most of the time these are mild and consist of tremors in the hands and increases in heart rate, breathing, and temperature. Sometimes these symptoms are associated with anxiety, panic attacks, and bad dreams. There may also be stomach upset. Normal sleep patterns are often interrupted with periods of inability to sleep (insomnia). This may last for 6 months. Because of this discomfort, many people choose to continue drinking to get rid of this discomfort and to try to feel normal. °Severe Withdrawal with Decreased or No Alcohol Intake, When Addiction or Chemical Dependency Has Developed °About five percent of alcoholics will develop signs of severe withdrawal when they stop using alcohol. One sign of this is development of generalized seizures (convulsions). Other signs of this are severe agitation and confusion. This may be associated with believing in things which are not real or seeing things which are not really there (delusions and hallucinations). Vitamin deficiencies are usually present if alcohol intake has been long-term. Treatment for this most often requires hospitalization and close observation. °Addiction can only be helped by stopping use of all chemicals. This is hard but may  save your life. With continual alcohol use, possible outcomes are usually loss of self respect and esteem, violence, and death. °Addiction cannot be cured but it can be stopped. This often requires outside help and the care of professionals. Treatment centers are listed in the yellow pages under Cocaine, Narcotics, and Alcoholics Anonymous. Most hospitals and clinics can refer you to a specialized care center. °It is not necessary for you to go through the uncomfortable symptoms of withdrawal. Your caregiver can provide you with medicines that will help you through this difficult period. Try to avoid situations, friends, or drugs that made it possible for you to keep using alcohol in the past. Learn how to say no. °It takes a long period of time to overcome addictions to all drugs, including alcohol. There may be many times when you feel as though you want a drink. After getting rid of the physical addiction and withdrawal, you will have a lessening of the craving which tells you that you need alcohol to feel normal. Call your caregiver if more support is needed. Learn who to talk to in your family and among your friends so that during these periods you can receive outside help. Alcoholics Anonymous (AA) has helped many people over the years. To get further help, contact AA or call your caregiver, counselor, or clergyperson. Al-Anon and Alateen are support groups for friends and family members of an alcoholic. The people who love and care for an alcoholic often need help, too. For information about these organizations, check your phone directory or call a local alcoholism treatment center.  °SEEK IMMEDIATE MEDICAL CARE IF:  °· You have a seizure. °· You have a fever. °· You experience uncontrolled vomiting or you   vomit up blood. This may be bright red or look like black coffee grounds. °· You have blood in the stool. This may be bright red or appear as a black, tarry, bad-smelling stool. °· You become lightheaded or  faint. Do not drive if you feel this way. Have someone else drive you or call 911 for help. °· You become more agitated or confused. °· You develop uncontrolled anxiety. °· You begin to see things that are not really there (hallucinate). °Your caregiver has determined that you completely understand your medical condition, and that your mental state is back to normal. You understand that you have been treated for alcohol withdrawal, have agreed not to drink any alcohol for a minimum of 1 day, will not operate a car or other machinery for 24 hours, and have had an opportunity to ask any questions about your condition. °Document Released: 09/14/2005 Document Revised: 02/27/2012 Document Reviewed: 07/23/2008 °[employer]® Patient Information ©2015 [employer]. This information is not intended to replace advice given to you by your health care provider. Make sure you discuss any questions you have with your health care provider. ° °

## 2014-09-04 NOTE — ED Provider Notes (Signed)
CSN: [id number]     Arrival date & time 09/04/14  1445 History   First MD Initiated Contact with Patient 09/04/14 1705     Chief Complaint  Patient presents with  . Multiple Complaints      (Consider location/radiation/quality/duration/timing/severity/associated sxs/prior Treatment) HPI Comments: Patient presents to the emergency department with chief complaint of alcohol withdrawal, and numbness and tingling to lower extremities. 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 She states that her last drink was this morning. She reports feeling nauseated, tremulous, and generally not feeling well. She denies any fevers, or chills. Denies any bowel or bladder incontinence. Denies any low back pain. Denies any known mechanism of injury. She is able to ambulate. There are no aggravating or relieving factors.  The history is provided by the patient. No language interpreter was used.    Past Medical History  Diagnosis Date  . Anxiety   . Cervical intraepithelial neoplasia III   . 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 H/O varicella   . Abnormal Pap smear     Age 38  . Yeast infection   . Bacterial infection   . Trichomonas   . HPV (human papilloma virus) anogenital infection   . 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     Age 2  . 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 Hx: UTI (urinary tract infection)     Back pain  . Irregular menstrual cycle   . H/O fatigue 2009  . Palpitations 2010  . Headache(784.0)   . Condyloma 2011  . Hx of dizziness 09/20/2011  . Pelvic pain in female 12/05/11  . IV drug user   . Alcohol abuse    Past Surgical History  Procedure Laterality Date  . Conization cervix      x 3  . A*wisdom teeth ext    . Svd      x 1  . Cervical conization w/bx  11/30/2011    Procedure: CONIZATION CERVIX WITH BIOPSY;  Surgeon: [name], MD;  Location: [facility];   Service: Gynecology;  Laterality: N/A;  . Dilation and curettage of uterus  11/30/2011    Procedure: DILATATION AND CURETTAGE;  Surgeon: [name], MD;  Location: [facility];  Service: Gynecology;  Laterality: N/A;  . Cervical conization w/bx  03/16/2012    Procedure: CONIZATION CERVIX WITH BIOPSY;  Surgeon: Ena Dawley, MD;  Location: [facility];  Service: Gynecology;  Laterality: N/A;   Family History  Problem Relation Age of Onset  . Cervical cancer Mother   . Breast cancer Mother   . Cancer Mother 63    Breast & cervical   . Stroke Maternal Grandmother   . COPD Maternal Grandfather     Emphysema  . Hypertension Paternal Grandfather   . Stroke Paternal Grandfather   . Cancer Father 48    prostate   History  Substance Use Topics  . Smoking status: Current Every Day Smoker -- 1.00 packs/day for 23 years    Types: Cigarettes  . Smokeless tobacco: Current User  . Alcohol Use: Yes     Comment: socially; every other day after work    OB History   Grav Para Term Preterm Abortions TAB SAB Ect Mult Living   1 1 1       1      Review of Systems  Constitutional: Negative for fever and chills.  Respiratory: Negative for shortness of breath.   Cardiovascular: Negative for chest pain.  Gastrointestinal: Negative for nausea, vomiting, diarrhea and constipation.  Genitourinary: Negative for dysuria.  Neurological: Positive for numbness.  All other systems reviewed and are negative.     Allergies  Review of patient's allergies indicates no known allergies.  Home Medications   Prior to Admission medications   Medication Sig Start Date End Date Taking? Authorizing Provider  buprenorphine-naloxone (SUBOXONE) 8-2 MG SUBL SL tablet Place 1 tablet under the tongue daily.   Yes Historical Provider, MD  folic acid (FOLVITE) 1 MG tablet Take 1 tablet (1 mg total) by mouth daily. 07/09/14  Yes Robbie Lis, MD  thiamine (VITAMIN B-1) 100 MG tablet Take 1 tablet (100 mg total) by  mouth daily. 07/09/14  Yes Robbie Lis, MD   BP 138/86  Pulse 78  Temp(Src) 98.1 F (36.7 C) (Oral)  Resp 18  SpO2 100% Physical Exam  Nursing note and vitals reviewed. Constitutional: She is oriented to person, place, and time. She appears well-developed and well-nourished. No distress.  Tremulous  HENT:  Head: Normocephalic and atraumatic.  Eyes: Conjunctivae and EOM are normal. Right eye exhibits no discharge. Left eye exhibits no discharge. No scleral icterus.  Neck: Normal range of motion. Neck supple. No tracheal deviation present.  Cardiovascular: Normal rate, regular rhythm and normal heart sounds.  Exam reveals no gallop and no friction rub.   No murmur heard. Pulmonary/Chest: Effort normal and breath sounds normal. No respiratory distress. She has no wheezes.  Abdominal: Soft. She exhibits no distension. There is no tenderness.  Musculoskeletal: Normal range of motion.  No CTLS paraspinal muscles tender to palpation, no bony tenderness, step-offs, or gross abnormality or deformity of spine, patient is able to ambulate, moves all extremities  Bilateral great toe extension intact Bilateral plantar/dorsiflexion intact  Neurological: She is alert and oriented to person, place, and time. She has normal reflexes.  Sensation and strength intact bilaterally Symmetrical reflexes  Skin: Skin is warm. She is not diaphoretic.  Psychiatric: She has a normal mood and affect. Her behavior is normal. Judgment and thought content normal.    ED Course  Procedures (including critical care time) Results for orders placed during the hospital encounter of 09/04/14  CBC      Result Value Ref Range   WBC 6.8  4.0 - 10.5 K/uL   RBC 3.13 (*) 3.87 - 5.11 MIL/uL   Hemoglobin 11.7 (*) 12.0 - 15.0 g/dL   HCT 34.7 (*) 36.0 - 46.0 %   MCV 110.9 (*) 78.0 - 100.0 fL   MCH 37.4 (*) 26.0 - 34.0 pg   MCHC 33.7  30.0 - 36.0 g/dL   RDW 14.9  11.5 - 15.5 %   Platelets 70 (*) 150 - 400 K/uL   COMPREHENSIVE METABOLIC PANEL      Result Value Ref Range   Sodium 143  137 - 147 mEq/L   Potassium 3.2 (*) 3.7 - 5.3 mEq/L   Chloride 101  96 - 112 mEq/L   CO2 25  19 - 32 mEq/L   Glucose, Bld 134 (*) 70 - 99 mg/dL   BUN <3 (*) 6 - 23 mg/dL   Creatinine, Ser 0.43 (*) 0.50 - 1.10 mg/dL   Calcium 8.9  8.4 - 10.5 mg/dL   Total Protein 7.8  6.0 - 8.3 g/dL   Albumin 3.6  3.5 - 5.2 g/dL   AST 280 (*) 0 - 37 U/L  ALT 43 (*) 0 - 35 U/L   Alkaline Phosphatase 185 (*) 39 - 117 U/L   Total Bilirubin 1.0  0.3 - 1.2 mg/dL   GFR calc non Af Amer >90  >90 mL/min   GFR calc Af Amer >90  >90 mL/min   Anion gap 17 (*) 5 - 15  ETHANOL      Result Value Ref Range   Alcohol, Ethyl (B) 72 (*) 0 - 11 mg/dL  URINE RAPID DRUG SCREEN (HOSP PERFORMED)      Result Value Ref Range   Opiates NONE DETECTED  NONE DETECTED   Cocaine NONE DETECTED  NONE DETECTED   Benzodiazepines NONE DETECTED  NONE DETECTED   Amphetamines NONE DETECTED  NONE DETECTED   Tetrahydrocannabinol POSITIVE (*) NONE DETECTED   Barbiturates NONE DETECTED  NONE DETECTED   No results found.    EKG Interpretation None      MDM   Final diagnoses:  Alcohol withdrawal, uncomplicated    Patient symptoms of alcohol withdrawal.  Symptoms are non-severe.  Tremulous, but no DTs, seizures, persistent vomiting, or other worrisome symptoms.  Her ETOH level is 72.  She does not want in patient treatment.  I discussed the patient with Dr. Stevie Kern, who recommends giving ativan for home use.  Patient is an addiction recovery therapist.  Patient feels comfortable with this plan, and agrees to return for worsening symptoms.  Patient is stable and ready for discharge.    Montine Circle, PA-C 09/04/14 2148

## 2014-09-04 NOTE — ED Notes (Signed)
Patient is aware that a urine sample is needed, patient states she is unable to provide a sample at this time.

## 2014-09-04 NOTE — ED Notes (Addendum)
Pt states that she is detoxing from etoh.  Normally drinks almost a fifth a day.  Last drink was this morning (2 beers).  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  Last methadone was 3 months ago.  Pt also states that she does not have an appetite, has "chronic diarrhea and chronic throwing up".  Pt also states that her legs go numb with pain.  Also states that sometimes when she wakes up, her heart races.  Not happening now.

## 2014-09-08 ENCOUNTER — Telehealth (HOSPITAL_BASED_OUTPATIENT_CLINIC_OR_DEPARTMENT_OTHER): Payer: Self-pay | Admitting: Emergency Medicine

## 2014-09-11 ENCOUNTER — Ambulatory Visit: Payer: No Typology Code available for payment source | Admitting: Family Medicine

## 2014-09-12 ENCOUNTER — Encounter: Payer: No Typology Code available for payment source | Admitting: Family Medicine

## 2014-09-12 DIAGNOSIS — Z0289 Encounter for other administrative examinations: Secondary | ICD-10-CM

## 2014-09-12 NOTE — Progress Notes (Signed)
Error-no show       This encounter was created in error - please disregard.

## 2014-09-17 NOTE — ED Provider Notes (Signed)
Medical screening examination/treatment/procedure(s) were performed by non-physician practitioner and as supervising physician I was immediately available for consultation/collaboration.   EKG Interpretation None       Babette Relic, MD 09/17/14 1316

## 2014-10-20 ENCOUNTER — Encounter (HOSPITAL_COMMUNITY): Payer: Self-pay | Admitting: Emergency Medicine

## 2014-11-17 ENCOUNTER — Emergency Department (HOSPITAL_COMMUNITY): Payer: Self-pay

## 2014-11-17 ENCOUNTER — Encounter (HOSPITAL_COMMUNITY): Payer: Self-pay

## 2014-11-17 ENCOUNTER — Emergency Department (HOSPITAL_COMMUNITY)
Admission: EM | Admit: 2014-11-17 | Discharge: 2014-11-17 | Disposition: A | Discharge status: Home / Self Care | Payer: Self-pay | Attending: Emergency Medicine | Admitting: Emergency Medicine

## 2014-11-17 DIAGNOSIS — R0789 Other chest pain: Secondary | ICD-10-CM | POA: Insufficient documentation

## 2014-11-17 DIAGNOSIS — Z9889 Other specified postprocedural states: Secondary | ICD-10-CM | POA: Insufficient documentation

## 2014-11-17 DIAGNOSIS — Z87440 Personal history of urinary (tract) infections: Secondary | ICD-10-CM | POA: Insufficient documentation

## 2014-11-17 DIAGNOSIS — Z8742 Personal history of other diseases of the female genital tract: Secondary | ICD-10-CM | POA: Insufficient documentation

## 2014-11-17 DIAGNOSIS — R079 Chest pain, unspecified: Secondary | ICD-10-CM

## 2014-11-17 DIAGNOSIS — F419 Anxiety disorder, unspecified: Secondary | ICD-10-CM | POA: Insufficient documentation

## 2014-11-17 DIAGNOSIS — Z79899 Other long term (current) drug therapy: Secondary | ICD-10-CM | POA: Insufficient documentation

## 2014-11-17 DIAGNOSIS — Z791 Long term (current) use of non-steroidal anti-inflammatories (NSAID): Secondary | ICD-10-CM | POA: Insufficient documentation

## 2014-11-17 DIAGNOSIS — J069 Acute upper respiratory infection, unspecified: Secondary | ICD-10-CM | POA: Insufficient documentation

## 2014-11-17 DIAGNOSIS — Z8619 Personal history of other infectious and parasitic diseases: Secondary | ICD-10-CM | POA: Insufficient documentation

## 2014-11-17 DIAGNOSIS — Z720 Tobacco use: Secondary | ICD-10-CM | POA: Insufficient documentation

## 2014-11-17 MED ORDER — IBUPROFEN 200 MG PO TABS
600.0000 mg | ORAL_TABLET | Freq: Once | ORAL | Status: DC
Start: 2014-11-17 — End: 2014-11-17
  Filled 2014-11-17: qty 3

## 2014-11-17 NOTE — ED Provider Notes (Signed)
CSN: [id number]     Arrival date & time 11/17/14  5573 History   First MD Initiated Contact with Patient 11/17/14 0754     Chief Complaint  Patient presents with  . Chest Pain  . Cough     (Consider location/radiation/quality/duration/timing/severity/associated sxs/prior Treatment) HPI Comments: 38 year old female with history of alcohol abuse, drug dependence, malnutrition presents with right upper chest discomfort worse with palpation and coughing. Patient has had a cough mild productive the past 3 days. Mild radiation along the right flank.Patient denies blood clot history, active cancer, recent major trauma or surgery, unilateral leg swelling/ pain, recent long travel, hemoptysis or oral contraceptives.     Patient is a 38 y.o. female presenting with chest pain and cough. The history is provided by the patient.  Chest Pain Associated symptoms: cough   Cough Associated symptoms: chest pain     Past Medical History  Diagnosis Date  . Anxiety   . Cervical intraepithelial neoplasia III   . 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 H/O varicella   . Abnormal Pap smear     Age 41  . Yeast infection   . Bacterial infection   . Trichomonas   . HPV (human papilloma virus) anogenital infection   . 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     Age 30  . 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 Hx: UTI (urinary tract infection)     Back pain  . Irregular menstrual cycle   . H/O fatigue 2009  . Palpitations 2010  . Headache(784.0)   . Condyloma 2011  . Hx of dizziness 09/20/2011  . Pelvic pain in female 12/05/11  . IV drug user   . Alcohol abuse    Past Surgical History  Procedure Laterality Date  . Conization cervix      x 3  . A*wisdom teeth ext    . Svd      x 1  . Cervical conization w/bx  11/30/2011    Procedure: CONIZATION CERVIX WITH BIOPSY;  Surgeon: [name], MD;  Location: [facility];  Service: Gynecology;   Laterality: N/A;  . Dilation and curettage of uterus  11/30/2011    Procedure: DILATATION AND CURETTAGE;  Surgeon: [name], MD;  Location: [facility];  Service: Gynecology;  Laterality: N/A;  . Cervical conization w/bx  03/16/2012    Procedure: CONIZATION CERVIX WITH BIOPSY;  Surgeon: Ena Dawley, MD;  Location: [facility];  Service: Gynecology;  Laterality: N/A;   Family History  Problem Relation Age of Onset  . Cervical cancer Mother   . Breast cancer Mother   . Cancer Mother 2    Breast & cervical   . Stroke Maternal Grandmother   . COPD Maternal Grandfather     Emphysema  . Hypertension Paternal Grandfather   . Stroke Paternal Grandfather   . Cancer Father 36    prostate   History  Substance Use Topics  . Smoking status: Current Every Day Smoker -- 1.00 packs/day for 23 years    Types: Cigarettes  . Smokeless tobacco: Current User  . Alcohol Use: Yes     Comment: socially; every other day after work    OB History    Gravida Para Term Preterm AB TAB SAB Ectopic Multiple Living   1 1 1       1      Review of Systems  Respiratory: Positive for cough.   Cardiovascular: Positive for chest pain.  Allergies  Review of patient's allergies indicates no known allergies.  Home Medications   Prior to Admission medications   Medication Sig Start Date End Date Taking? Authorizing Provider  buprenorphine-naloxone (SUBOXONE) 8-2 MG SUBL SL tablet Place 1 tablet under the tongue daily.   Yes Historical Provider, MD  folic acid (FOLVITE) 1 MG tablet Take 1 tablet (1 mg total) by mouth daily. Patient not taking: Reported on 11/17/2014 07/09/14   Robbie Lis, MD  LORazepam (ATIVAN) 1 MG tablet Take 1 tablet (1 mg total) by mouth 3 (three) times daily as needed for anxiety. Patient not taking: Reported on 11/17/2014 09/04/14   Montine Circle, PA-C  thiamine (VITAMIN B-1) 100 MG tablet Take 1 tablet (100 mg total) by mouth daily. Patient not taking: Reported on 11/17/2014  07/09/14   Robbie Lis, MD   BP 112/70 mmHg  Pulse 88  Temp(Src) 97.9 F (36.6 C) (Oral)  Resp 18  SpO2 96%  LMP 10/23/2014 (Approximate) Physical Exam  Constitutional: She is oriented to person, place, and time. She appears well-developed and well-nourished.  HENT:  Head: Normocephalic and atraumatic.  Eyes: Conjunctivae are normal. Right eye exhibits no discharge. Left eye exhibits no discharge.  Neck: Normal range of motion. Neck supple. No tracheal deviation present.  Cardiovascular: Normal rate and regular rhythm.   Pulmonary/Chest: Effort normal and breath sounds normal.  Abdominal: Soft. She exhibits no distension. There is no tenderness. There is no guarding.  Musculoskeletal: She exhibits tenderness. She exhibits no edema.  Patient has significant tenderness to palpation right upper lateral chest proximal pectoralis region, no rash or ecchymosis. Patient's full range motion of right arm and shoulder with discomfort with flexion of the right arm.  Neurological: She is alert and oriented to person, place, and time.  Skin: Skin is warm. No rash noted.  Psychiatric: She has a normal mood and affect.  Nursing note and vitals reviewed.   ED Course  Procedures (including critical care time)   EMERGENCY DEPARTMENT [place] CARDIAC EXAM "Study: Limited Ultrasound of the heart and pericardium"  INDICATIONS:chest pain, decr voltage Multiple views of the heart and pericardium were obtained in real-time with a multi-frequency probe.  PERFORMED BS:[id number]  IMAGES ARCHIVED?: Yes  FINDINGS: No pericardial effusion and Normal contractility   VIEWS USED: Parasternal long axis, Parasternal short axis and Apical 4 chamber   INTERPRETATION: Cardiac activity present, Pericardial effusioin absent, Cardiac tamponade absent and Normal contractility  Labs Review Labs Reviewed - No data to display  Imaging Review Dg Chest 2 View  11/17/2014   CLINICAL DATA:  Right-side chest pain for 3  days.  Smoker.  EXAM: CHEST  2 VIEW  COMPARISON:  CT chest 01/22/2010.  FINDINGS: The lungs are clear. Heart size is normal. No pneumothorax or pleural effusion. No focal bony abnormality.  IMPRESSION: No acute disease.   Electronically Signed   By: Inge Rise M.D.   On: 11/17/2014 08:15     EKG Interpretation   Date/Time:  Monday November 17 2014 07:45:10 EST Ventricular Rate:  84 PR Interval:  126 QRS Duration: 80 QT Interval:  353 QTC Calculation: 417 R Axis:   11 Text Interpretation:  Sinus rhythm Low voltage, precordial leads Confirmed  by Annalisa Colonna  MD, Rhilyn Battle ([id number]) on 11/17/2014 8:42:52 AM      MDM   Final diagnoses:  Right-sided chest wall pain  URI (upper respiratory infection)   Patient clinically has musculoskeletal pain with significant pain reproducible. Patient very low risk blood  clot and clinically other diagnosis. Vitals normal. Pain medicines given in the ER, chest x-ray reviewed no acute findings. Discussed supportive care and follow-up outpatient. EKG reviewed no acute findings  Results and differential diagnosis were discussed with the patient/parent/guardian. Close follow up outpatient was discussed, comfortable with the plan.   Medications  ibuprofen (ADVIL,MOTRIN) tablet 600 mg (600 mg Oral Not Given 11/17/14 0833)    Filed Vitals:   11/17/14 0752  BP: 112/70  Pulse: 88  Temp: 97.9 F (36.6 C)  TempSrc: Oral  Resp: 18  SpO2: 96%    Final diagnoses:  Right-sided chest wall pain  URI (upper respiratory infection)      Mariea Clonts, MD 11/17/14 1110

## 2014-11-17 NOTE — ED Notes (Signed)
Per pt, chest pain on right side radiating to back x 3 days.  Pt states she has a cough but does cough frequently d/t smoking.  No fever.  Some sputum but normal also to smoking status.  Pain is constant

## 2014-11-17 NOTE — ED Notes (Signed)
Discharge instructions reviewed with patient--agrees and verbalized understanding Patient informed of need to make and keep follow up appointment--agrees and verbalized understanding VS updated and stable--reviewed with patient at time of DC--agrees and verbalized understanding Patient alert and oriented x 4 and in NAD at time of discharge All questions related to this ED visit, DC instructions, and follow up care answered to patient's satisfaction by this nurse

## 2014-11-17 NOTE — Discharge Instructions (Signed)
If you were given medicines take as directed.  If you are on coumadin or contraceptives realize their levels and effectiveness is altered by many different medicines.  If you have any reaction (rash, tongues swelling, other) to the medicines stop taking and see a physician.   Please follow up as directed and return to the ER or see a physician for new or worsening symptoms.  Thank you. Filed Vitals:   11/17/14 0752  BP: 112/70  Pulse: 88  Temp: 97.9 F (36.6 C)  TempSrc: Oral  Resp: 18  SpO2: 96%    Chest Pain (Nonspecific) It is often hard to give a specific diagnosis for the cause of chest pain. There is always a chance that your pain could be related to something serious, such as a heart attack or a blood clot in the lungs. You need to follow up with your health care provider for further evaluation. CAUSES   Heartburn.  Pneumonia or bronchitis.  Anxiety or stress.  Inflammation around your heart (pericarditis) or lung (pleuritis or pleurisy).  A blood clot in the lung.  A collapsed lung (pneumothorax). It can develop suddenly on its own (spontaneous pneumothorax) or from trauma to the chest.  Shingles infection (herpes zoster virus). The chest wall is composed of bones, muscles, and cartilage. Any of these can be the source of the pain.  The bones can be bruised by injury.  The muscles or cartilage can be strained by coughing or overwork.  The cartilage can be affected by inflammation and become sore (costochondritis). DIAGNOSIS  Lab tests or other studies may be needed to find the cause of your pain. Your health care provider may have you take a test called an ambulatory electrocardiogram (ECG). An ECG records your heartbeat patterns over a 24-hour period. You may also have other tests, such as:  Transthoracic echocardiogram (TTE). During echocardiography, sound waves are used to evaluate how blood flows through your heart.  Transesophageal echocardiogram  (TEE).  Cardiac monitoring. This allows your health care provider to monitor your heart rate and rhythm in real time.  Holter monitor. This is a portable device that records your heartbeat and can help diagnose heart arrhythmias. It allows your health care provider to track your heart activity for several days, if needed.  Stress tests by exercise or by giving medicine that makes the heart beat faster. TREATMENT   Treatment depends on what may be causing your chest pain. Treatment may include:  Acid blockers for heartburn.  Anti-inflammatory medicine.  Pain medicine for inflammatory conditions.  Antibiotics if an infection is present.  You may be advised to change lifestyle habits. This includes stopping smoking and avoiding alcohol, caffeine, and chocolate.  You may be advised to keep your head raised (elevated) when sleeping. This reduces the chance of acid going backward from your stomach into your esophagus. Most of the time, nonspecific chest pain will improve within 2-3 days with rest and mild pain medicine.  HOME CARE INSTRUCTIONS   If antibiotics were prescribed, take them as directed. Finish them even if you start to feel better.  For the next few days, avoid physical activities that bring on chest pain. Continue physical activities as directed.  Do not use any tobacco products, including cigarettes, chewing tobacco, or electronic cigarettes.  Avoid drinking alcohol.  Only take medicine as directed by your health care provider.  Follow your health care provider's suggestions for further testing if your chest pain does not go away.  Keep any follow-up  appointments you made. If you do not go to an appointment, you could develop lasting (chronic) problems with pain. If there is any problem keeping an appointment, call to reschedule. SEEK MEDICAL CARE IF:   Your chest pain does not go away, even after treatment.  You have a rash with blisters on your chest.  You have  a fever. SEEK IMMEDIATE MEDICAL CARE IF:   You have increased chest pain or pain that spreads to your arm, neck, jaw, back, or abdomen.  You have shortness of breath.  You have an increasing cough, or you cough up blood.  You have severe back or abdominal pain.  You feel nauseous or vomit.  You have severe weakness.  You faint.  You have chills. This is an emergency. Do not wait to see if the pain will go away. Get medical help at once. Call your local emergency services (911 in [place] Do not drive yourself to the hospital. MAKE SURE YOU:   Understand these instructions.  Will watch your condition.  Will get help right away if you are not doing well or get worse. Document Released: 09/14/2005 Document Revised: 12/10/2013 Document Reviewed: 07/10/2008 [facility] Patient Information 2015 [place], [place]. This information is not intended to replace advice given to you by your health care provider. Make sure you discuss any questions you have with your health care provider.

## 2014-11-17 NOTE — ED Notes (Signed)
MD at bedside. 

## 2014-11-17 NOTE — ED Notes (Signed)
Patient refusing Motrin, stated "I already took some this morning." Patient talking with friend at bedside Patient in NAD

## 2015-05-01 ENCOUNTER — Encounter (HOSPITAL_COMMUNITY): Payer: Self-pay | Admitting: Emergency Medicine

## 2015-05-01 ENCOUNTER — Emergency Department (HOSPITAL_COMMUNITY)
Admission: EM | Admit: 2015-05-01 | Discharge: 2015-05-01 | Discharge status: Against Medical Advice | Payer: 59 | Attending: Emergency Medicine | Admitting: Emergency Medicine

## 2015-05-01 DIAGNOSIS — Z8619 Personal history of other infectious and parasitic diseases: Secondary | ICD-10-CM | POA: Insufficient documentation

## 2015-05-01 DIAGNOSIS — Z79899 Other long term (current) drug therapy: Secondary | ICD-10-CM | POA: Insufficient documentation

## 2015-05-01 DIAGNOSIS — Z87440 Personal history of urinary (tract) infections: Secondary | ICD-10-CM | POA: Diagnosis not present

## 2015-05-01 DIAGNOSIS — Z720 Tobacco use: Secondary | ICD-10-CM | POA: Diagnosis not present

## 2015-05-01 DIAGNOSIS — Z8742 Personal history of other diseases of the female genital tract: Secondary | ICD-10-CM | POA: Insufficient documentation

## 2015-05-01 DIAGNOSIS — F419 Anxiety disorder, unspecified: Secondary | ICD-10-CM | POA: Insufficient documentation

## 2015-05-01 DIAGNOSIS — R102 Pelvic and perineal pain: Secondary | ICD-10-CM | POA: Insufficient documentation

## 2015-05-01 DIAGNOSIS — Z86018 Personal history of other benign neoplasm: Secondary | ICD-10-CM | POA: Diagnosis not present

## 2015-05-01 DIAGNOSIS — Z9889 Other specified postprocedural states: Secondary | ICD-10-CM | POA: Insufficient documentation
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
## 2015-05-05 ENCOUNTER — Encounter (HOSPITAL_COMMUNITY): Payer: Self-pay

## 2015-05-05 ENCOUNTER — Emergency Department (HOSPITAL_COMMUNITY)
Admission: EM | Admit: 2015-05-05 | Discharge: 2015-05-06 | Disposition: A | Discharge status: Home / Self Care | Payer: 59 | Attending: Emergency Medicine | Admitting: Emergency Medicine

## 2015-05-05 DIAGNOSIS — B9689 Other specified bacterial agents as the cause of diseases classified elsewhere: Secondary | ICD-10-CM

## 2015-05-05 DIAGNOSIS — Z86001 Personal history of in-situ neoplasm of cervix uteri: Secondary | ICD-10-CM | POA: Diagnosis not present

## 2015-05-05 DIAGNOSIS — Z3202 Encounter for pregnancy test, result negative: Secondary | ICD-10-CM | POA: Diagnosis not present

## 2015-05-05 DIAGNOSIS — Z720 Tobacco use: Secondary | ICD-10-CM | POA: Diagnosis not present

## 2015-05-05 DIAGNOSIS — Z87410 Personal history of cervical dysplasia: Secondary | ICD-10-CM | POA: Insufficient documentation

## 2015-05-05 DIAGNOSIS — N760 Acute vaginitis: Secondary | ICD-10-CM | POA: Diagnosis not present

## 2015-05-05 DIAGNOSIS — R102 Pelvic and perineal pain: Secondary | ICD-10-CM

## 2015-05-05 DIAGNOSIS — Z79899 Other long term (current) drug therapy: Secondary | ICD-10-CM | POA: Insufficient documentation

## 2015-05-05 DIAGNOSIS — Z87440 Personal history of urinary (tract) infections: Secondary | ICD-10-CM | POA: Insufficient documentation

## 2015-05-05 DIAGNOSIS — F419 Anxiety disorder, unspecified: Secondary | ICD-10-CM | POA: Insufficient documentation

## 2015-05-05 NOTE — ED Notes (Signed)
Called to take to treatment room  No response from lobby 
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
## 2015-05-06 LAB — URINALYSIS, ROUTINE W REFLEX MICROSCOPIC
Bilirubin Urine: NEGATIVE
Glucose, UA: NEGATIVE mg/dL
Hgb urine dipstick: NEGATIVE
Ketones, ur: NEGATIVE mg/dL
Nitrite: NEGATIVE
Protein, ur: NEGATIVE mg/dL
Specific Gravity, Urine: 1.004 — ABNORMAL LOW (ref 1.005–1.030)
Urobilinogen, UA: 0.2 mg/dL (ref 0.0–1.0)
pH: 5.5 (ref 5.0–8.0)

## 2015-05-06 LAB — URINE MICROSCOPIC-ADD ON

## 2015-05-06 LAB — WET PREP, GENITAL
Trich, Wet Prep: NONE SEEN
WBC, Wet Prep HPF POC: NONE SEEN
Yeast Wet Prep HPF POC: NONE SEEN

## 2015-05-06 LAB — PREGNANCY, URINE: Preg Test, Ur: NEGATIVE

## 2015-05-06 MED ORDER — NAPROXEN 500 MG PO TABS: 500.0000 mg | ORAL_TABLET | Freq: Two times a day (BID) | ORAL | Status: DC

## 2015-05-06 MED ORDER — METRONIDAZOLE 500 MG PO TABS: 500.0000 mg | ORAL_TABLET | Freq: Two times a day (BID) | ORAL | Status: DC

## 2015-05-06 NOTE — Discharge Instructions (Signed)
Follow directions provided. Be sure to follow-up with your primary care doctor for further evaluation. Please take the metronidazole as directed until all gone. You may take the naproxen twice a day to help with pain and inflammation. Don't hesitate to return for any new, worsening, or concerning symptoms.   SEEK MEDICAL CARE IF:  You have abdominal pain.  You have a fever or persistent symptoms for more than 2-3 days.  You have a fever and your symptoms suddenly get worse.   Emergency Department Resource Guide 1) Find a Doctor and Pay Out of Pocket Although you won't have to find out who is covered by your insurance plan, it is a good idea to ask around and get recommendations. You will then need to call the office and see if the doctor you have chosen will accept you as a new patient and what types of options they offer for patients who are self-pay. Some doctors offer discounts or will set up payment plans for their patients who do not have insurance, but you will need to ask so you aren't surprised when you get to your appointment.  2) Contact Your Local Health Department Not all health departments have doctors that can see patients for sick visits, but many do, so it is worth a call to see if yours does. If you don't know where your local health department is, you can check in your phone book. The CDC also has a tool to help you locate your state's health department, and many state websites also have listings of all of their local health departments.  3) Find a [facility] If your illness is not likely to be very severe or complicated, you may want to try a walk in clinic. These are popping up all over the country in pharmacies, drugstores, and shopping centers. They're usually staffed by nurse practitioners or physician assistants that have been trained to treat common illnesses and complaints. They're usually fairly quick and inexpensive. However, if you have serious medical issues or  chronic medical problems, these are probably not your best option.  No Primary Care Doctor: - Call Health Connect at  [phone] - they can help you locate a primary care doctor that  accepts your insurance, provides certain services, etc. - Physician Referral Service- [phone]  Chronic Pain Problems: Organization         Address  Phone   Notes  [facility]  [phone] Patients need to be referred by their primary care doctor.   Medication Assistance: Organization         Address  Phone   Notes  [facility] Medication [facility] [address] [place], [place] [address] [phone] --Must be a resident of [facility] -- Must have NO insurance coverage whatsoever (no Medicaid/ Medicare, etc.) -- The pt. MUST have a primary care doctor that directs their care regularly and follows them in the community   [facility]  ([phone]   [employer]  [phone]    Agencies that provide inexpensive medical care: Organization         Address  Phone   Notes  [place]  [phone]   [facility]    [phone]   [facility] [place], [place] [address] [phone]   [place] [address], [place] [phone]   Planned Parenthood    [phone]  [facility]    [phone]   [facility]  [address], [place] Phone:  [phone], Fax:  [phone] Hours of Operation:  9 am - 6 pm, M-F.  Also accepts Medicaid/Medicare and self-pay.  [facility] for [place] [address], [address], [place] Phone: [phone], Fax: [phone]. Hours of Operation:  8:30 am - 5:30 pm, M-F.  Also accepts Medicaid and self-pay.  [facility] [place] [address], [place] Phone: [phone]   [place], [place], [place]  [phone], Ext. [phone] Mondays & Thursdays: 7-9 AM.  First 15 patients are seen on a first come, first serve basis.    [place] Providers:  Organization         Address  Phone   Notes  [facility] [address], [place] [phone] Also accepts self-pay patients.  [facility] [address], [place]  [phone]   [place], [address], [place] [phone]   [facility] [address] [place] [phone]   Lucianne Lei [address], [place]   [phone] Only accepts [place] Access [place] patients after they have their name applied to their card.   Self-Pay (no insurance) in [facility]:  Organization         Address  Phone   Notes  Sickle Cell Patients, [facility] [place] [phone]   [facility] [place] [phone]   [facility] [place]  [place], [place], [place] [phone]   [facility]/Dr. Osei-Bonsu  [address] [place] or [address], [place] [phone] Phone number for both [place] and [place] locations is the same.  [facility] [address] [place] [phone]   [facility] [address] [place] or [address] [phone]   [facility] [address], [place] [phone], phone; [phone], fax Sees patients 1st and 3rd Saturday of every month.  Must not qualify for public or private insurance (i.e. Medicaid, Medicare, [place] Health Choice, Veterans' Benefits)  Household income should be no more than 200% of the poverty level The clinic cannot treat you if you are pregnant or think you are pregnant  Sexually transmitted  diseases are not treated at the clinic.    Dental Care: Organization         Address  Phone  Notes  [facility] Department of [facility] [place] [phone] Accepts children up to age 15 who are enrolled in [place] or [place]; pregnant women with a Medicaid card; and children who have applied for Medicaid or [place] Health Choice, but were declined, whose parents can pay a reduced fee at time of service.  [facility]  [address], [place] [phone] Accepts children up to age 40 who are enrolled in [place] or [place]; pregnant women with a Medicaid card; and children who have applied for Medicaid or [place] Health Choice, but were declined, whose parents can pay a reduced fee at time  of service.  °[facility] ° [address], [place] [phone] Patients are seen by appointment only. Walk-ins are not accepted. [facility] will see patients 18 years of age and older. °Monday - Tuesday (8am-5pm) °Most Wednesdays (8:30-5pm) °$30 per visit, cash only  °[facility] ° [address], [place] [phone] Patients are seen by appointment only. Walk-ins are not accepted. [facility] will see patients 18 years of age and older. °One Wednesday Evening (Monthly: Volunteer Based).  $30 per visit, cash only  °[facility]  [phone] for adults; Children under age 4, call Graduate Pediatric Dentistry at [phone]. Children aged 4-14, please call [phone] to request a pediatric application. ° Dental services are provided in all areas of dental care including fillings, crowns and bridges, complete and partial dentures, implants, gum treatment, root canals, and extractions. Preventive care is also provided. Treatment is provided to both adults and children. °Patients are selected via a  lottery and there is often a waiting list. °  °[facility] [address], °[place] ° ([phone] [email or web address] °  °[facility] [address], [place], [place] ([phone], Ext. [phone] Second and Fourth Thursday of each month, opens at 6:30 AM; Clinic ends at 9 AM.  Patients are seen on a first-come first-served basis, and a limited number are seen during each clinic.  ° °[facility] ° [address], [place], [place] [phone]   Eligibility Requirements °You must have lived in [place], [place], or [place] counties for at least the last three months. °  You cannot be eligible for state or federal sponsored healthcare insurance, including Veterans Administration, Medicaid, or Medicare. °  You generally cannot be eligible for healthcare insurance through your employer.  °  How to apply: °Eligibility screenings are held every Tuesday and Wednesday afternoon from 1:00 pm until 4:00 pm. You do not need an appointment for the interview!  °[facility] [address], [place], [place] [phone]   °[facility]  [phone]   °[facility]  [phone]   °[facility]  336-570-6415   ° °Behavioral Health Resources in the Community: °Intensive Outpatient Programs °Organization         Address  Phone  Notes  °[facility] [address], [place], [place] [phone]   °[facility] [address], [place], [place] [phone]   °[facility] [address], [place], [place] ° [phone]   °[facility] [address],  °[place], [place] 1-800-853-5163 or 336-641-4981   °Substance Abuse Resources °Organization         Address  Phone  Notes  °[facility]  336-882-2125   °[facility]  336-784-9470   °[facility]  336-285-9073   °[facility]  336-845-3988   °Residential &  Outpatient Substance Abuse Program  1-800-659-3381   °Psychological Services °Organization         Address  Phone  Notes  °[facility]  336- [phone]   °[facility]  336- 378-7881   °[facility] [address],  1-800-853-5163 or 336-641-4981   ° °Mobile Crisis Teams °Organization         Address  Phone  Notes  °[facility], Mobile Crisis Care Unit  1-877-626-1772   °Assertive °Psychotherapeutic   Services  [address] [place], [place]   [facility] [address], [place] [phone]    Self-Help/Support Groups Organization         Address  Phone             Notes  [place]. of [place] - variety of support groups  [place] Call for more information  Narcotics Anonymous (NA), [facility] [address], [employer] [place]  2 meetings at this location   Special educational needs teacher         Address  Phone  Notes  [facility] [place],    [place]  1-(346) [phone]   [facility]  [address] [place] [phone], [place], [place]   [place], [place] [phone] Admissions: 8am-3pm M-F  Incentives Substance [place] [address]    [place], [place] [phone]   [facility] [address] [place], [place], [place]   [facility] [address],  [place], [place]   [facility] - Intensive Outpatient [address], [place], [place]   [facility] [place]  [place], [place] 1-512-035-3021 or [phone]   [facility] ([facility]) [address], [place], [place] Accepts Medicaid  [facility] [place] [address],  [place] 1-239-789-1735 Substance Abuse/Addiction Treatment   [facility] Organization          Address  Phone  Notes  [facility]  365-235-8861   Domenic Schwab, PhD [address] Arlis [place], [place]   (914) 595-2423 or [phone]   [place], [place] [phone]   [facility] [address] [place], [place] [phone] Insurance/Medicaid/sponsorship through [facility] [address] [place]                                    [place], [place] [phone] [place] [address], [place] [phone]    Dr. Adele Schilder  [phone]   [facility] Dept. 1) [address], [place] 2) [place] 3)  [address], [place] [phone]  [phone]   [place] [phone] or [phone] (After Hours)

## 2015-05-07 LAB — GC/CHLAMYDIA PROBE AMP (~~LOC~~) NOT AT ARMC
Chlamydia: NEGATIVE
Neisseria Gonorrhea: NEGATIVE

## 2015-06-12 ENCOUNTER — Encounter (HOSPITAL_COMMUNITY): Payer: Self-pay | Admitting: Emergency Medicine

## 2015-06-12 ENCOUNTER — Emergency Department (HOSPITAL_COMMUNITY)
Admission: EM | Admit: 2015-06-12 | Discharge: 2015-06-13 | Discharge status: Against Medical Advice | Payer: 59 | Attending: Emergency Medicine | Admitting: Emergency Medicine

## 2015-06-12 DIAGNOSIS — Z0471 Encounter for examination and observation following alleged adult physical abuse: Secondary | ICD-10-CM | POA: Diagnosis not present

## 2015-06-12 DIAGNOSIS — Z046 Encounter for general psychiatric examination, requested by authority: Secondary | ICD-10-CM | POA: Insufficient documentation

## 2015-06-12 NOTE — ED Notes (Signed)
[employer] at bedside per patient's request.

## 2015-06-12 NOTE — ED Notes (Signed)
[employer] Officer Dalbert Batman at bedside
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
## 2015-06-12 NOTE — ED Notes (Signed)
Bed: WA24 Expected date:  Expected time:  Means of arrival:  Comments: Triage 1 

## 2015-06-13 NOTE — ED Notes (Signed)
Pt. To leave AMA, signed, Nehemiah Settle PA notified. Pt. Denies any distress.

## 2015-06-20 ENCOUNTER — Emergency Department (HOSPITAL_COMMUNITY)
Admission: EM | Admit: 2015-06-20 | Discharge: 2015-06-20 | Discharge status: Against Medical Advice | Payer: 59 | Attending: Emergency Medicine | Admitting: Emergency Medicine

## 2015-06-20 ENCOUNTER — Encounter (HOSPITAL_COMMUNITY): Payer: Self-pay | Admitting: Oncology

## 2015-06-20 DIAGNOSIS — Z79899 Other long term (current) drug therapy: Secondary | ICD-10-CM | POA: Diagnosis not present

## 2015-06-20 DIAGNOSIS — H9319 Tinnitus, unspecified ear: Secondary | ICD-10-CM | POA: Diagnosis present

## 2015-06-20 DIAGNOSIS — Z87440 Personal history of urinary (tract) infections: Secondary | ICD-10-CM | POA: Insufficient documentation

## 2015-06-20 DIAGNOSIS — Z86018 Personal history of other benign neoplasm: Secondary | ICD-10-CM | POA: Insufficient documentation

## 2015-06-20 DIAGNOSIS — Z8619 Personal history of other infectious and parasitic diseases: Secondary | ICD-10-CM | POA: Insufficient documentation

## 2015-06-20 DIAGNOSIS — F22 Delusional disorders: Secondary | ICD-10-CM | POA: Insufficient documentation

## 2015-06-20 DIAGNOSIS — F419 Anxiety disorder, unspecified: Secondary | ICD-10-CM | POA: Insufficient documentation

## 2015-06-20 DIAGNOSIS — Z720 Tobacco use: Secondary | ICD-10-CM | POA: Insufficient documentation

## 2015-06-20 DIAGNOSIS — R000 Tachycardia, unspecified: Secondary | ICD-10-CM | POA: Insufficient documentation

## 2015-06-20 DIAGNOSIS — Z8742 Personal history of other diseases of the female genital tract: Secondary | ICD-10-CM | POA: Insufficient documentation

## 2015-06-20 DIAGNOSIS — F1510 Other stimulant abuse, uncomplicated: Secondary | ICD-10-CM | POA: Diagnosis not present

## 2015-06-20 LAB — I-STAT CHEM 8, ED
BUN: 9 mg/dL (ref 6–20)
Calcium, Ion: 1.15 mmol/L (ref 1.12–1.23)
Chloride: 100 mmol/L — ABNORMAL LOW (ref 101–111)
Creatinine, Ser: 0.9 mg/dL (ref 0.44–1.00)
Glucose, Bld: 112 mg/dL — ABNORMAL HIGH (ref 65–99)
HCT: 42 % (ref 36.0–46.0)
Hemoglobin: 14.3 g/dL (ref 12.0–15.0)
Potassium: 3.6 mmol/L (ref 3.5–5.1)
Sodium: 139 mmol/L (ref 135–145)
TCO2: 24 mmol/L (ref 0–100)

## 2015-06-20 LAB — CBC WITH DIFFERENTIAL/PLATELET
Basophils Absolute: 0 10*3/uL (ref 0.0–0.1)
Basophils Relative: 1 % (ref 0–1)
Eosinophils Absolute: 0.1 10*3/uL (ref 0.0–0.7)
Eosinophils Relative: 3 % (ref 0–5)
HCT: 36.9 % (ref 36.0–46.0)
Hemoglobin: 12.3 g/dL (ref 12.0–15.0)
Lymphocytes Relative: 41 % (ref 12–46)
Lymphs Abs: 1.4 10*3/uL (ref 0.7–4.0)
MCH: 34.2 pg — ABNORMAL HIGH (ref 26.0–34.0)
MCHC: 33.3 g/dL (ref 30.0–36.0)
MCV: 102.5 fL — ABNORMAL HIGH (ref 78.0–100.0)
Monocytes Absolute: 0.4 10*3/uL (ref 0.1–1.0)
Monocytes Relative: 13 % — ABNORMAL HIGH (ref 3–12)
Neutro Abs: 1.4 10*3/uL — ABNORMAL LOW (ref 1.7–7.7)
Neutrophils Relative %: 42 % — ABNORMAL LOW (ref 43–77)
Platelets: 184 10*3/uL (ref 150–400)
RBC: 3.6 MIL/uL — ABNORMAL LOW (ref 3.87–5.11)
RDW: 13.6 % (ref 11.5–15.5)
WBC: 3.3 10*3/uL — ABNORMAL LOW (ref 4.0–10.5)

## 2015-06-20 LAB — RAPID URINE DRUG SCREEN, HOSP PERFORMED
Amphetamines: POSITIVE — AB
Barbiturates: NOT DETECTED
Benzodiazepines: NOT DETECTED
Cocaine: NOT DETECTED
Opiates: NOT DETECTED
Tetrahydrocannabinol: NOT DETECTED

## 2015-06-20 LAB — ETHANOL: Alcohol, Ethyl (B): 104 mg/dL — ABNORMAL HIGH (ref <5)

## 2015-06-20 MED ORDER — IBUPROFEN 200 MG PO TABS
600.0000 mg | ORAL_TABLET | Freq: Three times a day (TID) | ORAL | Status: DC | PRN
  Administered 2015-06-20: 600 mg via ORAL
  Filled 2015-06-20: qty 3

## 2015-06-20 MED ORDER — NICOTINE 21 MG/24HR TD PT24: 21.0000 mg | MEDICATED_PATCH | Freq: Every day | TRANSDERMAL | Status: DC

## 2015-06-20 MED ORDER — ZOLPIDEM TARTRATE 5 MG PO TABS: 5.0000 mg | ORAL_TABLET | Freq: Every evening | ORAL | Status: DC | PRN

## 2015-06-20 MED ORDER — ONDANSETRON HCL 4 MG PO TABS: 4.0000 mg | ORAL_TABLET | Freq: Three times a day (TID) | ORAL | Status: DC | PRN

## 2015-06-20 NOTE — ED Notes (Addendum)
Pt became agitated and left AMA.  Dr. Lita Mains is aware and will not pursue IVC at this time as pt is not endorsing SI/HI.  Pt refused to sign AMA form or allow VS to be taken.
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
## 2015-06-20 NOTE — ED Notes (Signed)
TTS in progress at this time.  
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
## 2015-07-17 ENCOUNTER — Encounter: Payer: Self-pay | Admitting: Emergency Medicine

## 2015-07-17 ENCOUNTER — Inpatient Hospital Stay
Admission: EM | Admit: 2015-07-17 | Discharge: 2015-07-19 | DRG: 897 | Disposition: A | Discharge status: Home / Self Care | Payer: 59 | Attending: Internal Medicine | Admitting: Internal Medicine

## 2015-07-17 DIAGNOSIS — F10931 Alcohol use, unspecified with withdrawal delirium: Secondary | ICD-10-CM | POA: Diagnosis present

## 2015-07-17 DIAGNOSIS — Z8249 Family history of ischemic heart disease and other diseases of the circulatory system: Secondary | ICD-10-CM

## 2015-07-17 DIAGNOSIS — Z803 Family history of malignant neoplasm of breast: Secondary | ICD-10-CM

## 2015-07-17 DIAGNOSIS — K7010 Alcoholic hepatitis without ascites: Secondary | ICD-10-CM | POA: Diagnosis present

## 2015-07-17 DIAGNOSIS — Z8049 Family history of malignant neoplasm of other genital organs: Secondary | ICD-10-CM

## 2015-07-17 DIAGNOSIS — Z823 Family history of stroke: Secondary | ICD-10-CM

## 2015-07-17 DIAGNOSIS — Z79899 Other long term (current) drug therapy: Secondary | ICD-10-CM

## 2015-07-17 DIAGNOSIS — Z9889 Other specified postprocedural states: Secondary | ICD-10-CM | POA: Diagnosis present

## 2015-07-17 DIAGNOSIS — F329 Major depressive disorder, single episode, unspecified: Secondary | ICD-10-CM | POA: Diagnosis present

## 2015-07-17 DIAGNOSIS — Z808 Family history of malignant neoplasm of other organs or systems: Secondary | ICD-10-CM

## 2015-07-17 DIAGNOSIS — F10231 Alcohol dependence with withdrawal delirium: Principal | ICD-10-CM | POA: Diagnosis present

## 2015-07-17 DIAGNOSIS — F17210 Nicotine dependence, cigarettes, uncomplicated: Secondary | ICD-10-CM | POA: Diagnosis present

## 2015-07-17 DIAGNOSIS — Z825 Family history of asthma and other chronic lower respiratory diseases: Secondary | ICD-10-CM

## 2015-07-17 DIAGNOSIS — E876 Hypokalemia: Secondary | ICD-10-CM | POA: Diagnosis present

## 2015-07-17 LAB — CBC
HCT: 40.7 % (ref 35.0–47.0)
Hemoglobin: 13.9 g/dL (ref 12.0–16.0)
MCH: 35.4 pg — ABNORMAL HIGH (ref 26.0–34.0)
MCHC: 34.2 g/dL (ref 32.0–36.0)
MCV: 103.5 fL — ABNORMAL HIGH (ref 80.0–100.0)
Platelets: 157 10*3/uL (ref 150–440)
RBC: 3.93 MIL/uL (ref 3.80–5.20)
RDW: 13.4 % (ref 11.5–14.5)
WBC: 13.6 10*3/uL — ABNORMAL HIGH (ref 3.6–11.0)

## 2015-07-17 LAB — URINE DRUG SCREEN, QUALITATIVE (ARMC ONLY)
Amphetamines, Ur Screen: POSITIVE — AB
Barbiturates, Ur Screen: NOT DETECTED
Benzodiazepine, Ur Scrn: POSITIVE — AB
Cannabinoid 50 Ng, Ur ~~LOC~~: NOT DETECTED
Cocaine Metabolite,Ur ~~LOC~~: NOT DETECTED
MDMA (Ecstasy)Ur Screen: NOT DETECTED
Methadone Scn, Ur: NOT DETECTED
Opiate, Ur Screen: POSITIVE — AB
Phencyclidine (PCP) Ur S: NOT DETECTED
Tricyclic, Ur Screen: POSITIVE — AB

## 2015-07-17 LAB — COMPREHENSIVE METABOLIC PANEL
ALT: 86 U/L — ABNORMAL HIGH (ref 14–54)
AST: 210 U/L — ABNORMAL HIGH (ref 15–41)
Albumin: 5.2 g/dL — ABNORMAL HIGH (ref 3.5–5.0)
Alkaline Phosphatase: 60 U/L (ref 38–126)
Anion gap: 20 — ABNORMAL HIGH (ref 5–15)
BUN: 17 mg/dL (ref 6–20)
CO2: 23 mmol/L (ref 22–32)
Calcium: 10 mg/dL (ref 8.9–10.3)
Chloride: 90 mmol/L — ABNORMAL LOW (ref 101–111)
Creatinine, Ser: 1.37 mg/dL — ABNORMAL HIGH (ref 0.44–1.00)
GFR calc Af Amer: 55 mL/min — ABNORMAL LOW (ref >60)
GFR calc non Af Amer: 47 mL/min — ABNORMAL LOW (ref >60)
Glucose, Bld: 91 mg/dL (ref 65–99)
Potassium: 3.3 mmol/L — ABNORMAL LOW (ref 3.5–5.1)
Sodium: 133 mmol/L — ABNORMAL LOW (ref 135–145)
Total Bilirubin: 1.5 mg/dL — ABNORMAL HIGH (ref 0.3–1.2)
Total Protein: 8.5 g/dL — ABNORMAL HIGH (ref 6.5–8.1)

## 2015-07-17 LAB — PREGNANCY, URINE: Preg Test, Ur: NEGATIVE

## 2015-07-17 LAB — ETHANOL: Alcohol, Ethyl (B): <5 mg/dL (ref <5)

## 2015-07-17 MED ORDER — LORAZEPAM 2 MG/ML IJ SOLN
2.0000 mg | Freq: Once | INTRAMUSCULAR | Status: AC
  Administered 2015-07-17: 2 mg via INTRAVENOUS

## 2015-07-17 MED ORDER — SODIUM CHLORIDE 0.9 % IV BOLUS (SEPSIS)
1000.0000 mL | Freq: Once | INTRAVENOUS | Status: AC
  Administered 2015-07-17: 1000 mL via INTRAVENOUS

## 2015-07-17 MED ORDER — THIAMINE HCL 100 MG/ML IJ SOLN
Freq: Once | INTRAVENOUS | Status: AC
  Administered 2015-07-17: 20:00:00 via INTRAVENOUS
  Filled 2015-07-17: qty 1000

## 2015-07-17 MED ORDER — CETYLPYRIDINIUM CHLORIDE 0.05 % MT LIQD
7.0000 mL | Freq: Two times a day (BID) | OROMUCOSAL | Status: DC
  Administered 2015-07-17: 7 mL via OROMUCOSAL

## 2015-07-17 MED ORDER — LORAZEPAM 2 MG PO TABS: 0.0000 mg | ORAL_TABLET | Freq: Four times a day (QID) | ORAL | Status: DC

## 2015-07-17 MED ORDER — FOLIC ACID 1 MG PO TABS
1.0000 mg | ORAL_TABLET | Freq: Every day | ORAL | Status: DC
  Administered 2015-07-18 – 2015-07-19 (×2): 1 mg via ORAL
  Filled 2015-07-17 (×2): qty 1

## 2015-07-17 MED ORDER — LORAZEPAM 2 MG/ML IJ SOLN
INTRAMUSCULAR | Status: AC
  Administered 2015-07-17: 2 mg via INTRAVENOUS
  Filled 2015-07-17: qty 2

## 2015-07-17 MED ORDER — THIAMINE HCL 100 MG/ML IJ SOLN: 100.0000 mg | Freq: Every day | INTRAMUSCULAR | Status: DC

## 2015-07-17 MED ORDER — LORAZEPAM 2 MG/ML IJ SOLN
2.0000 mg | INTRAMUSCULAR | Status: DC | PRN
Start: 2015-07-17 — End: 2015-07-19
  Administered 2015-07-18: 3 mg via INTRAVENOUS
  Administered 2015-07-18: 2 mg via INTRAVENOUS
  Administered 2015-07-18: 3 mg via INTRAVENOUS
  Filled 2015-07-17: qty 1
  Filled 2015-07-17 (×2): qty 2

## 2015-07-17 MED ORDER — SODIUM CHLORIDE 0.9 % IJ SOLN
3.0000 mL | Freq: Two times a day (BID) | INTRAMUSCULAR | Status: DC
  Administered 2015-07-18: 3 mL via INTRAVENOUS

## 2015-07-17 MED ORDER — LORAZEPAM 2 MG/ML IJ SOLN
0.0000 mg | Freq: Two times a day (BID) | INTRAMUSCULAR | Status: DC
  Administered 2015-07-17: 2 mg via INTRAVENOUS

## 2015-07-17 MED ORDER — MORPHINE SULFATE 2 MG/ML IJ SOLN: 2.0000 mg | INTRAMUSCULAR | Status: DC | PRN

## 2015-07-17 MED ORDER — VITAMIN B-1 100 MG PO TABS: 100.0000 mg | ORAL_TABLET | Freq: Every day | ORAL | Status: DC

## 2015-07-17 MED ORDER — LORAZEPAM 2 MG PO TABS: 0.0000 mg | ORAL_TABLET | Freq: Two times a day (BID) | ORAL | Status: DC

## 2015-07-17 MED ORDER — ONDANSETRON HCL 4 MG/2ML IJ SOLN: 4.0000 mg | Freq: Four times a day (QID) | INTRAMUSCULAR | Status: DC | PRN

## 2015-07-17 MED ORDER — LORAZEPAM 2 MG/ML IJ SOLN
0.0000 mg | Freq: Four times a day (QID) | INTRAMUSCULAR | Status: DC
  Filled 2015-07-17: qty 2

## 2015-07-17 MED ORDER — HEPARIN SODIUM (PORCINE) 5000 UNIT/ML IJ SOLN
5000.0000 [IU] | Freq: Three times a day (TID) | INTRAMUSCULAR | Status: DC
  Administered 2015-07-17 – 2015-07-18 (×4): 5000 [IU] via SUBCUTANEOUS
  Filled 2015-07-17 (×5): qty 1

## 2015-07-17 MED ORDER — ADULT MULTIVITAMIN W/MINERALS CH
1.0000 | ORAL_TABLET | Freq: Every day | ORAL | Status: DC
  Administered 2015-07-18 – 2015-07-19 (×2): 1 via ORAL
  Filled 2015-07-17 (×3): qty 1

## 2015-07-17 MED ORDER — ONDANSETRON HCL 4 MG PO TABS: 4.0000 mg | ORAL_TABLET | Freq: Four times a day (QID) | ORAL | Status: DC | PRN

## 2015-07-17 MED ORDER — LORAZEPAM 2 MG/ML IJ SOLN
4.0000 mg | Freq: Once | INTRAMUSCULAR | Status: AC
  Administered 2015-07-17: 4 mg via INTRAVENOUS

## 2015-07-17 MED ORDER — ACETAMINOPHEN 650 MG RE SUPP: 650.0000 mg | Freq: Four times a day (QID) | RECTAL | Status: DC | PRN

## 2015-07-17 MED ORDER — ACETAMINOPHEN 325 MG PO TABS: 650.0000 mg | ORAL_TABLET | Freq: Four times a day (QID) | ORAL | Status: DC | PRN

## 2015-07-17 MED ORDER — THIAMINE HCL 100 MG/ML IJ SOLN: 100.0000 mg | Freq: Every day | INTRAMUSCULAR | Status: DC | PRN

## 2015-07-17 MED ORDER — LORAZEPAM 2 MG/ML IJ SOLN
INTRAMUSCULAR | Status: AC
  Administered 2015-07-17: 4 mg via INTRAVENOUS
  Filled 2015-07-17: qty 2

## 2015-07-17 MED ORDER — VITAMIN B-1 100 MG PO TABS
100.0000 mg | ORAL_TABLET | Freq: Every day | ORAL | Status: DC
  Administered 2015-07-18 – 2015-07-19 (×2): 100 mg via ORAL
  Filled 2015-07-17 (×2): qty 1

## 2015-07-17 NOTE — [facility] Counselor (Signed)
Made an attempt to assess patient.  Pt was responding to an external stimuli.  Pt's altered mental state prevents assessment at this time.

## 2015-07-17 NOTE — ED Provider Notes (Signed)
[facility] Emergency Department Provider Note  ____________________________________________  Time seen: Approximately 7:15 PM  I have reviewed the triage vital signs and the nursing notes.   HISTORY  Chief Complaint Alcohol and hallucinations  HPI [name] is a 39 y.o. female patient reports she social drinker can't say how much she drinks however. Husband reports that she is a binge drinker has not had any alcohol for 2-3 days. Patient was found outside trying to take her clothes off and on thousand herself with water because she was being bitten by mosquitoes. Here the tech reports patient was swatting at imaginary insects and scratching saying she is being eaten alive by insects. There are no insects in the room. When I speak to her patient responds appropriately but then says she doesn't want to much Ativan and wants to walk out she doesn't want to be wheeled out and she says she is wants to go through the back door there is no back to the room. Shortly thereafter patient began sticking the pulse ox on her socks in a very agitated manner. Patient came in with a heart rate of 151. She received 2 mg Ativan followed by a second bolus of 2 mg Ativan heart rate went down to 130s as I talk to the patient and immediately afterwards she began to do the pulse ox on her socks thing. Patient appears to be having alcoholic hallucinosis   Past Medical History  Diagnosis Date  . Anxiety   . Cervical intraepithelial neoplasia III   . 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 H/O varicella   . Abnormal Pap smear     Age 36  . Yeast infection   . Bacterial infection   . Trichomonas   . HPV (human papilloma virus) anogenital infection   . 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     Age 51  . 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 Hx: UTI (urinary tract infection)     Back pain  . Irregular menstrual cycle   . H/O fatigue  2009  . Palpitations 2010  . Headache(784.0)   . Condyloma 2011  . Hx of dizziness 09/20/2011  . Pelvic pain in female 12/05/11  . IV drug user   . Alcohol abuse     Patient Active Problem List   Diagnosis Date Noted  . Alcohol withdrawal delirium 07/17/2015  . Ascites 07/21/2014  . Alcohol withdrawal 07/09/2014  . Alcoholism /alcohol abuse 07/09/2014  . Abnormal computed tomography angiography (CTA) of abdomen and pelvis 07/09/2014  . Malnutrition of moderate degree 07/09/2014  . Liver function study, abnormal 04/08/2014  . ETOH abuse 04/08/2014  . Palpitations - evaluated by Dr. Cathie Olden in the past 02/20/2014  . Anxiety 04/02/2012  . Drug dependence 03/18/2012  . 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    Past Surgical History  Procedure Laterality Date  . Conization cervix      x 3  . A*wisdom teeth ext    . Svd      x 1  . Cervical conization w/bx  11/30/2011    Procedure: CONIZATION CERVIX WITH BIOPSY;  Surgeon: [name], MD;  Location: [facility];  Service: Gynecology;  Laterality: N/A;  . Dilation and curettage of uterus  11/30/2011    Procedure: DILATATION AND CURETTAGE;  Surgeon: [name], MD;  Location: [facility];  Service: Gynecology;  Laterality: N/A;  . Cervical conization w/bx  03/16/2012  Procedure: CONIZATION CERVIX WITH BIOPSY;  Surgeon: Ena Dawley, MD;  Location: [facility];  Service: Gynecology;  Laterality: N/A;    No current outpatient prescriptions on file.  Allergies Review of patient's allergies indicates no known allergies.  Family History  Problem Relation Age of Onset  . Cervical cancer Mother   . Breast cancer Mother   . Cancer Mother 64    Breast & cervical   . Stroke Maternal Grandmother   . COPD Maternal Grandfather     Emphysema  . Hypertension Paternal Grandfather   . Stroke Paternal Grandfather   . Cancer Father 76    prostate    Social History History  Substance Use Topics  . Smoking status: Current  Every Day Smoker -- 1.00 packs/day for 23 years    Types: Cigarettes  . Smokeless tobacco: Current User  . Alcohol Use: Yes     Comment: socially; every other day after work     Review of Systems Constitutional: No fever/chills Eyes: No visual changes. ENT: No sore throat. Cardiovascular: Denies chest pain. Respiratory: Denies shortness of breath. Gastrointestinal: No abdominal pain.  No nausea, no vomiting.  No diarrhea.  No constipation. Genitourinary: Negative for dysuria. Musculoskeletal: Negative for back pain. Skin: Negative for rash. Neurological: Negative for headaches, focal weakness or numbness.  10-point ROS otherwise negative.  ____________________________________________   PHYSICAL EXAM:  VITAL SIGNS: ED Triage Vitals  Enc Vitals Group     BP 07/17/15 1843 114/84 mmHg     Pulse Rate 07/17/15 1843 157     Resp 07/17/15 1843 20     Temp 07/17/15 1843 98.2 F (36.8 C)     Temp Source 07/17/15 1843 Oral     SpO2 07/17/15 1843 100 %     Weight 07/17/15 1843 122 lb (55.339 kg)     Height 07/17/15 1843 5\' 1"  (1.549 m)     Head Cir --      Peak Flow --      Pain Score 07/17/15 1844 0     Pain Loc --      Pain Edu? --      Excl. in [place]? --     Constitutional: Alert C history of present illness Eyes: Conjunctivae are normal. PERRL. EOMI. Head: Atraumatic. Nose: No congestion/rhinnorhea. Mouth/Throat: Mucous membranes are moist.  Oropharynx non-erythematous. Neck: No stridor.  Cardiovascular: Regular rate and very tachycardic grossly normal heart sounds Respiratory: Normal respiratory effort.  No retractions. Lungs CTAB. Gastrointestinal: Soft and nontender. No distention. No abdominal bruits. No CVA tenderness. Musculoskeletal: No lower extremity tenderness nor edema.  No joint effusions. Neurologic:  Normal speech but somewhat confused language. No gross focal neurologic deficits are appreciated.  Skin:  Skin is warm, flushed Psychiatric: See history of  present illness  ____________________________________________   LABS (all labs ordered are listed, but only abnormal results are displayed)  Labs Reviewed  COMPREHENSIVE METABOLIC PANEL - Abnormal; Notable for the following:    Sodium 133 (*)    Potassium 3.3 (*)    Chloride 90 (*)    Creatinine, Ser 1.37 (*)    Total Protein 8.5 (*)    Albumin 5.2 (*)    AST 210 (*)    ALT 86 (*)    Total Bilirubin 1.5 (*)    GFR calc non Af Amer 47 (*)    GFR calc Af Amer 55 (*)    Anion gap 20 (*)    All other components within normal limits  CBC -  Abnormal; Notable for the following:    WBC 13.6 (*)    MCV 103.5 (*)    MCH 35.4 (*)    All other components within normal limits  URINE DRUG SCREEN, QUALITATIVE ([facility] ONLY) - Abnormal; Notable for the following:    Tricyclic, Ur Screen POSITIVE (*)    Amphetamines, Ur Screen POSITIVE (*)    Opiate, Ur Screen POSITIVE (*)    Benzodiazepine, Ur Scrn POSITIVE (*)    All other components within normal limits  MRSA PCR SCREENING  ETHANOL  PREGNANCY, URINE   ____________________________________________  EKG  EKG read and interpreted by me shows sinus tachycardia at a rate of 140 left axis no acute ST-T wave changes ____________________________________________  RADIOLOGY   ____________________________________________   PROCEDURES    ____________________________________________   INITIAL IMPRESSION / ASSESSMENT AND PLAN / ED COURSE  Pertinent labs & imaging results that were available during my care of the patient were reviewed by me and considered in my medical decision making (see chart for details).   ____________________________________________   FINAL CLINICAL IMPRESSION(S) / ED DIAGNOSES  Final diagnoses:  Alcohol withdrawal delirium      Nena Polio, MD 07/17/15 [phone]

## 2015-07-17 NOTE — ED Notes (Signed)
BEHAVIORAL HEALTH ROUNDING Patient sleeping: Yes.   Patient alert and oriented: not applicable Behavior appropriate: Yes.   Nutrition and fluids offered: Yes  Toileting and hygiene offered: Yes  Sitter present: yes Law enforcement present: Yes  

## 2015-07-17 NOTE — H&P (Signed)
[place] [place] NAME: Hannahmarie Asberry    MR#:  [id number]  DATE OF BIRTH:  May 27, 1976   DATE OF ADMISSION:  07/17/2015  PRIMARY CARE PHYSICIAN: Lucretia Kern., DO   REQUESTING/REFERRING PHYSICIAN: Malinda  CHIEF COMPLAINT:   Chief Complaint  Patient presents with  . Hallucinations  . Alcohol Problem    HISTORY OF PRESENT ILLNESS:  Olivya Sobol  is a 39 y.o. female with a known history of alcohol abuse presenting with hallucinations. Patient unable to provide meaningful information given mental status/medical condition. History obtained from family members who are no longer present at bedside. Her husband brought her to the hospital after stating found her naked in the front yard actually as if she was hallucinating. States that she is a binge drinker and has not drank for about 2 days prior to onset of symptoms. No further information available at this time. Emergency department course she has received a total of 8 mg of Ativan, her symptoms are now better controlled.  PAST MEDICAL HISTORY:   Past Medical History  Diagnosis Date  . Anxiety   . Cervical intraepithelial neoplasia III   . 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 H/O varicella   . Abnormal Pap smear     Age 89  . Yeast infection   . Bacterial infection   . Trichomonas   . HPV (human papilloma virus) anogenital infection   . 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     Age 33  . 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 Hx: UTI (urinary tract infection)     Back pain  . Irregular menstrual cycle   . H/O fatigue 2009  . Palpitations 2010  . Headache(784.0)   . Condyloma 2011  . Hx of dizziness 09/20/2011  . Pelvic pain in female 12/05/11  . IV drug user   . Alcohol abuse     PAST SURGICAL HISTORY:   Past Surgical History  Procedure Laterality Date  . Conization cervix      x 3  . A*wisdom teeth  ext    . Svd      x 1  . Cervical conization w/bx  11/30/2011    Procedure: CONIZATION CERVIX WITH BIOPSY;  Surgeon: [name], MD;  Location: [facility];  Service: Gynecology;  Laterality: N/A;  . Dilation and curettage of uterus  11/30/2011    Procedure: DILATATION AND CURETTAGE;  Surgeon: [name], MD;  Location: [facility];  Service: Gynecology;  Laterality: N/A;  . Cervical conization w/bx  03/16/2012    Procedure: CONIZATION CERVIX WITH BIOPSY;  Surgeon: Ena Dawley, MD;  Location: [facility];  Service: Gynecology;  Laterality: N/A;    SOCIAL HISTORY:   History  Substance Use Topics  . Smoking status: Current Every Day Smoker -- 1.00 packs/day for 23 years    Types: Cigarettes  . Smokeless tobacco: Current User  . Alcohol Use: Yes     Comment: socially; every other day after work     FAMILY HISTORY:   Family History  Problem Relation Age of Onset  . Cervical cancer Mother   . Breast cancer Mother   . Cancer Mother 103    Breast & cervical   . Stroke Maternal Grandmother   . COPD Maternal Grandfather     Emphysema  . Hypertension Paternal Grandfather   . Stroke Paternal Grandfather   . Cancer Father  61    prostate    DRUG ALLERGIES:  No Known Allergies  REVIEW OF SYSTEMS:  Unable to obtain given patient's mental status/medical condition   MEDICATIONS AT HOME:   Prior to Admission medications   Medication Sig Start Date End Date Taking? Authorizing Provider  amphetamine-dextroamphetamine (ADDERALL) 20 MG tablet Take 20 mg by mouth 3 (three) times daily.   Yes Historical Provider, MD  Buprenorphine HCl-Naloxone HCl (ZUBSOLV) 5.7-1.4 MG SUBL Place 1 each under the tongue 2 (two) times daily.   Yes Historical Provider, MD      VITAL SIGNS:  Blood pressure 103/68, pulse 108, temperature 98.2 F (36.8 C), temperature source Oral, resp. rate 16, height 5\' 1"  (1.549 m), weight 122 lb (55.339 kg), SpO2 98 %.  PHYSICAL EXAMINATION:  VITAL SIGNS: Filed  Vitals:   07/17/15 2041  BP: 103/68  Pulse: 108  Temp:   Resp: 34   GENERAL:39 y.o.female currently in moderate distress given mental status HEAD: Normocephalic, atraumatic.  EYES: Pupils equal, round, reactive to light. Extraocular muscles intact. No scleral icterus.  MOUTH: Moist mucosal membrane. Dentition intact. No abscess noted.  EAR, NOSE, THROAT: Clear without exudates. No external lesions.  NECK: Supple. No thyromegaly. No nodules. No JVD.  PULMONARY: Clear to ascultation, without wheeze rails or rhonci. No use of accessory muscles, Good respiratory effort. good air entry bilaterally CHEST: Nontender to palpation.  CARDIOVASCULAR: S1 and S2. Tachycardic No murmurs, rubs, or gallops. No edema. Pedal pulses 2+ bilaterally.  GASTROINTESTINAL: Soft, nontender, nondistended. No masses. Positive bowel sounds. No hepatosplenomegaly.  MUSCULOSKELETAL: No swelling, clubbing, or edema. Passive Range of motion full in all extremities.  NEUROLOGIC: Unable to fully examine as patient is unable to follow simple commands at this time.  SKIN: No ulceration, lesions, rashes, or cyanosis. Skin warm and dry. Turgor intact.  PSYCHIATRIC: Unable to exam as patient is unable to cooperate and physical exam   LABORATORY PANEL:   CBC  Recent Labs Lab 07/17/15 1852  WBC 13.6*  HGB 13.9  HCT 40.7  PLT 157   ------------------------------------------------------------------------------------------------------------------  Chemistries   Recent Labs Lab 07/17/15 1852  NA 133*  K 3.3*  CL 90*  CO2 23  GLUCOSE 91  BUN 17  CREATININE 1.37*  CALCIUM 10.0  AST 210*  ALT 86*  ALKPHOS 60  BILITOT 1.5*   ------------------------------------------------------------------------------------------------------------------  Cardiac Enzymes No results for input(s): TROPONINI in the last 168  hours. ------------------------------------------------------------------------------------------------------------------  RADIOLOGY:  No results found.  EKG:   Orders placed or performed during the hospital encounter of 07/17/15  . ED EKG  . ED EKG    IMPRESSION AND PLAN:   39 year old Caucasian female history of alcohol abuse presenting with apparent alcohol withdrawal.  1. Alcohol withdrawal with delirium: Initiate CIWA protocol utilizing IV Ativan if she requires multiple doses of when necessary Ativan may require Ativan drip because of this will admit to stepdown with telemetry. Supplemental thiamine folic acid multivitamins, consult psychiatry further alcohol withdrawal/detox, check urine drug screen as well  2. Venous thromboembolism prophylactic: Heparin subcutaneous   All the records are reviewed and case discussed with ED provider. Management plans discussed with the patient, family and they are in agreement.  CODE STATUS: Full  TOTAL TIME TAKING CARE OF THIS PATIENT: 35 critical minutes.    Hower,  Karenann Cai.D on 07/17/2015 at 8:53 PM  Between 7am to 6pm - Pager - [phone]  After 6pm: House Pager: - [phone]  [facility]  Office  [phone]  CC: Primary care physician; Lucretia Kern., DO

## 2015-07-17 NOTE — ED Notes (Signed)

## 2015-07-17 NOTE — ED Notes (Signed)
Pt here by EMS due to patient stripping naked in backyard; husband told EMS pt is binge drinker, pt has not had a drink in a couple of days. Husband reports pt started having hallucinations and acting odd last night, progressed worse today.

## 2015-07-17 NOTE — ED Notes (Signed)
BEHAVIORAL HEALTH ROUNDING Patient sleeping: No. Patient alert and oriented: no Behavior appropriate: No.; If no, describe: Visual and tactile hallucinations, incomprehensible speech, restless, mild tremors. Nutrition and fluids offered: Yes  Toileting and hygiene offered: Yes  Sitter present: yes Law enforcement present: No

## 2015-07-18 LAB — GLUCOSE, CAPILLARY: Glucose-Capillary: 82 mg/dL (ref 65–99)

## 2015-07-18 LAB — MRSA PCR SCREENING: MRSA by PCR: NEGATIVE

## 2015-07-18 MED ORDER — BUPRENORPHINE HCL 8 MG SL SUBL
12.0000 mg | SUBLINGUAL_TABLET | Freq: Every day | SUBLINGUAL | Status: DC
  Administered 2015-07-18: 12 mg via SUBLINGUAL
  Filled 2015-07-18: qty 2

## 2015-07-18 MED ORDER — POTASSIUM CHLORIDE 20 MEQ PO PACK
40.0000 meq | PACK | Freq: Once | ORAL | Status: AC
  Administered 2015-07-18: 40 meq via ORAL
  Filled 2015-07-18: qty 2

## 2015-07-18 MED ORDER — ENSURE ENLIVE PO LIQD
237.0000 mL | Freq: Two times a day (BID) | ORAL | Status: DC
  Administered 2015-07-19: 237 mL via ORAL

## 2015-07-18 MED ORDER — ADULT MULTIVITAMIN W/MINERALS CH: 1.0000 | ORAL_TABLET | Freq: Every day | ORAL | Status: DC

## 2015-07-18 MED ORDER — POTASSIUM CHLORIDE IN NACL 20-0.9 MEQ/L-% IV SOLN
INTRAVENOUS | Status: DC
  Administered 2015-07-18: 12:00:00 via INTRAVENOUS
  Filled 2015-07-18 (×3): qty 1000

## 2015-07-18 MED ORDER — BUPRENORPHINE HCL-NALOXONE HCL 5.7-1.4 MG SL SUBL: 2.0000 | SUBLINGUAL_TABLET | Freq: Every day | SUBLINGUAL | Status: DC

## 2015-07-18 MED ORDER — DEXMEDETOMIDINE HCL IN NACL 200 MCG/50ML IV SOLN
0.4000 ug/kg/h | INTRAVENOUS | Status: DC
  Administered 2015-07-18: 1 ug/kg/h via INTRAVENOUS

## 2015-07-18 MED ORDER — DEXMEDETOMIDINE HCL IN NACL 400 MCG/100ML IV SOLN
0.4000 ug/kg/h | INTRAVENOUS | Status: DC
  Administered 2015-07-18: 0.8 ug/kg/h via INTRAVENOUS
  Administered 2015-07-18: 1 ug/kg/h via INTRAVENOUS
  Filled 2015-07-18 (×2): qty 100

## 2015-07-18 NOTE — Progress Notes (Signed)
   07/18/15 1330  Clinical Encounter Type  Visited With Patient  Visit Type Initial  Referral From Family  Consult/Referral To Chaplain  Spiritual Encounters  Spiritual Needs Prayer  Stress Factors  Patient Stress Factors Health changes  Chaplain visited with patient per request of husband. Patient was sedated and Chaplain silently prayed. Nurse stated patient should be ready to talk by Monday.   Marjory Lies [phone]
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
## 2015-07-18 NOTE — Consult Note (Signed)
  Pt was seen in ICU -17. Staff reports that pt has been drinking alcohol at the rate of a fifth or more of hard liquor a day for some time. Pt is married for 2 nd since Jan of 2016 and been together for 15 yrs. Mother has the custody of her child and reported to the staff " done with the pt." H/O Present Illness - staff report that pt has been abusing THC, Alcohol, Opiates and benzos for a long time, M.s. Pt was seen in ICU. Drowsy but is arousable and went back to sleep. Not aware of surroundings and is confused and is not able to communicate at all. Responding to stimuli and is probably hallucinating and talking and making statements that did not make much sense. Further mental status could not be done. I/j Impaired. Imp Alcohol dependence chronic with withdrawal and Delirium. 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 Substance Induced Mood disorder. MDD recurrent.] Rec Continue CIWA protocol. Will re-eval tomorrow ie 7/31.2016

## 2015-07-18 NOTE — Progress Notes (Signed)
Initial Nutrition Assessment   INTERVENTION:   Meals and Snacks: Cater to patient preferences; RN assessing ability to safely take po when awakened.  Medical Food Supplement Therapy: will recommend Ensure Enlive po BID, each supplement provides 350 kcal and 20 grams of protein. RN Katie agreeable to assist pt in drinking.  NUTRITION DIAGNOSIS:   Inadequate oral intake related to lethargy/confusion as evidenced by meal completion < 25%.  GOAL:   Patient will meet greater than or equal to 90% of their needs  MONITOR:    (Energy Intake, Electrolyte and renal Profile, Anthropometrics)  REASON FOR ASSESSMENT:   Malnutrition Screening Tool    ASSESSMENT:   Pt admitted with hallucinations, EtOH withdrawals. Pt asleep this am.  Past Medical History  Diagnosis Date  . Anxiety   . Cervical intraepithelial neoplasia III   . 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 H/O varicella   . Abnormal Pap smear     Age 39  . Yeast infection   . Bacterial infection   . Trichomonas   . HPV (human papilloma virus) anogenital infection   . 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 Hx: UTI (urinary tract infection)     Back pain  . Irregular menstrual cycle   . H/O fatigue 2009  . Palpitations 2010  . Headache(784.0)   . Condyloma 2011  . Hx of dizziness 09/20/2011  . Pelvic pain in female 12/05/11  . IV drug user   . Alcohol abuse     Diet Order:  Diet Heart Room service appropriate?: Yes; Fluid consistency:: Thin   Current Nutrition: Per RN Katie pt took pills with applesauce this am, no breakfast. Pt has been sleeping.  Food/Nutrition-Related History: Unable to assess, per MST pt with decreased appetite PTA.    Medications: Folic acid, MVI, thiamine, KCL, NS with KCl at 61m/hr, Precedex  Electrolyte/Renal Profile and Glucose Profile:   Recent Labs Lab 07/17/15 1852  NA 133*   K 3.3*  CL 90*  CO2 23  BUN 17  CREATININE 1.37*  CALCIUM 10.0  GLUCOSE 91   Protein Profile:  Recent Labs Lab 07/17/15 1852  ALBUMIN 5.2*   Hepatic Function Latest Ref Rng 07/17/2015 09/04/2014 07/21/2014  Total Protein 6.5 - 8.1 g/dL 8.5(H) 7.8 7.0  Albumin 3.5 - 5.0 g/dL 5.2(H) 3.6 3.2(L)  AST 15 - 41 U/L 210(H) 280(H) 301(HH)  ALT 14 - 54 U/L 86(H) 43(H) 103(H)  Alk Phosphatase 38 - 126 U/L 60 185(H) 123  Total Bilirubin 0.3 - 1.2 mg/dL 1.5(H) 1.0 0.72  Bilirubin, Direct 0.0 - 0.3 mg/dL - - -    Gastrointestinal Profile: Last BM: unknown   Nutrition-Focused Physical Exam Findings:  Unable to complete Nutrition-Focused physical exam at this time.     Weight Change: 9% [facility] in [facility]   Skin:  Reviewed, no issues  Height:   Ht Readings from Last 1 Encounters:  07/17/15 5' 4"  (1.626 m)    Weight:   Wt Readings from Last 1 Encounters:  07/17/15 117 lb 15.1 oz (53.5 kg)    Wt Readings from Last 10 Encounters:  07/17/15 117 lb 15.1 oz (53.5 kg)  05/01/15 120 lb (54.432 kg)  07/21/14 129 lb 11.2 oz (58.832 kg)  07/09/14 132 lb 4.8 oz (60.011 kg)  04/08/14 137 lb 6.4 oz (62.324 kg)  02/20/14 141 lb (63.957  kg)  04/02/12 142 lb (64.411 kg)    BMI:  Body mass index is 20.24 kg/(m^2).  Estimated Nutritional Needs:   Kcal:  1564-1849kcals, BEE: 1185kcals, TEE: (IF 1.1-1.3)(AF 1.2)   Protein:  53-64g protein (1.0-1.2g/kg)  Fluid:  1338-1657m of fluid (25-351mkg)  EDUCATION NEEDS:   Education needs no appropriate at this time  HIWestfieldRD, LDN Pager [phone]

## 2015-07-19 LAB — COMPREHENSIVE METABOLIC PANEL
ALT: 89 U/L — ABNORMAL HIGH (ref 14–54)
AST: 163 U/L — ABNORMAL HIGH (ref 15–41)
Albumin: 3.3 g/dL — ABNORMAL LOW (ref 3.5–5.0)
Alkaline Phosphatase: 46 U/L (ref 38–126)
Anion gap: 7 (ref 5–15)
BUN: 6 mg/dL (ref 6–20)
CO2: 25 mmol/L (ref 22–32)
Calcium: 8.9 mg/dL (ref 8.9–10.3)
Chloride: 105 mmol/L (ref 101–111)
Creatinine, Ser: 0.56 mg/dL (ref 0.44–1.00)
GFR calc Af Amer: >60 mL/min (ref >60)
GFR calc non Af Amer: >60 mL/min (ref >60)
Glucose, Bld: 169 mg/dL — ABNORMAL HIGH (ref 65–99)
Potassium: 4 mmol/L (ref 3.5–5.1)
Sodium: 137 mmol/L (ref 135–145)
Total Bilirubin: 0.8 mg/dL (ref 0.3–1.2)
Total Protein: 6.2 g/dL — ABNORMAL LOW (ref 6.5–8.1)

## 2015-07-19 LAB — CBC
HCT: 35.8 % (ref 35.0–47.0)
Hemoglobin: 12 g/dL (ref 12.0–16.0)
MCH: 35.6 pg — ABNORMAL HIGH (ref 26.0–34.0)
MCHC: 33.5 g/dL (ref 32.0–36.0)
MCV: 106.3 fL — ABNORMAL HIGH (ref 80.0–100.0)
Platelets: 119 10*3/uL — ABNORMAL LOW (ref 150–440)
RBC: 3.37 MIL/uL — ABNORMAL LOW (ref 3.80–5.20)
RDW: 12.8 % (ref 11.5–14.5)
WBC: 4.4 10*3/uL (ref 3.6–11.0)

## 2015-07-19 MED ORDER — ADULT MULTIVITAMIN W/MINERALS CH: 1.0000 | ORAL_TABLET | Freq: Every day | ORAL | Status: DC

## 2015-07-19 NOTE — Consult Note (Signed)
  Pt seen in follow up in ICU -17. S Pt is a 39 yr old white female, not employed, separated from legal wife and is going through divorce and moved in with her ex-husband along with her 2 yr old daughter.  Pt started drinking alcohol again at the rate of half a gallon of wine and has a DWI pending.  Past psych history. No previous h/O MI and no H/O Inpt hospitalization to psychiatry. No H/O suicide attempts And denies being followed by any [place]. Alcohol and drugs. Long H/O alcohol drinking and has been sober and started drinking again. Denies any other street or prescription drug abuse. M.s. Alert and ox3. Calm, pleasant and co-operative. No agitation. Affect is neutral and mood is stable and denies feeling depressed. No psychosis. Denies s/h ideas or plans and contracts for safety. I/j fair and adequate Impulse control is fair. Imp Alcohol dependence chronic continues. With Intoxication.. Substance Induced anxiety and depression. Rec Discharge pt and she is aware of all the Substance abuse programs in the Community including AA.

## 2015-07-19 NOTE — Progress Notes (Signed)
Pt remains sleepy and lethargic. .VSS. S brady on CM.Precedex drip is in progress. Lung sound clear sao2 99%. Resting in bed without any distress. Will continue to observe closely.

## 2015-07-19 NOTE — Progress Notes (Signed)
D/c home per md order, instructions given, iv's d/c, vss. Escorted out via wheelchair by [employer] CNA. Home with husband Gareth Eagle

## 2015-07-19 NOTE — Discharge Instructions (Signed)
Alcohol withdrawal is dangerous, you have a high risk of having recurrence of hallucinations as well as high risk of seizure and respiratory suppression.  You have been informed of this risk and have chosen to leave the hospital despite this risk.  If you have further symptoms you should return to the Emergency Department immediately.  Please follow up with the outpatient resources recommended by Dr. Franchot Mimes.  DIET:  Regular diet  DISCHARGE CONDITION:  Serious  ACTIVITY:  Activity as tolerated  OXYGEN:  Home Oxygen: No.   Oxygen Delivery: room air  DISCHARGE LOCATION:  home   If you experience worsening of your admission symptoms, develop shortness of breath, life threatening emergency, suicidal or homicidal thoughts you must seek medical attention immediately by calling 911 or calling your MD immediately  if symptoms less severe.  You Must read complete instructions/literature along with all the possible adverse reactions/side effects for all the Medicines you take and that have been prescribed to you. Take any new Medicines after you have completely understood and accpet all the possible adverse reactions/side effects.   Please note  You were cared for by a hospitalist during your hospital stay. If you have any questions about your discharge medications or the care you received while you were in the hospital after you are discharged, you can call the unit and asked to speak with the hospitalist on call if the hospitalist that took care of you is not available. Once you are discharged, your primary care physician will handle any further medical issues. Please note that NO REFILLS for any discharge medications will be authorized once you are discharged, as it is imperative that you return to your primary care physician (or establish a relationship with a primary care physician if you do not have one) for your aftercare needs so that they can reassess your need for medications and monitor your  lab values.

## 2015-07-19 NOTE — Evaluation (Signed)
Physical Therapy Evaluation Patient Details Name: CORNISHA ZETINO MRN: [id number] DOB: Mar 27, 1976 Today's Date: 07/19/2015   History of Present Illness  Pt with multi substance abuse, admitted to the CCU, now detoxing  Clinical Impression  Pt does well with PT exam walking 250 ft w/o safety issues and showing good confidence. She will likely need substance rehab f/u, but does not require further PT/rehab follow up.      Follow Up Recommendations No PT follow up    Equipment Recommendations       Recommendations for Other Services       Precautions / Restrictions Precautions Precautions: Fall Restrictions Weight Bearing Restrictions: No      Mobility  Bed Mobility Overal bed mobility: Independent                Transfers Overall transfer level: Independent                  Ambulation/Gait Ambulation/Gait assistance: Supervision Ambulation Distance (Feet): 250 Feet Assistive device: None       General Gait Details: Pt initially a little unsteady and cautious on standing up, but after ~50 ft of ambulation she walks with normalized gait patter, appropriate speed and good confidence.  She does not report fatigue, but her HR does increase to the mid 120s with the effort.   Stairs            Wheelchair Mobility    Modified Rankin (Stroke Patients Only)       Balance                                             Pertinent Vitals/Pain Pain Assessment: No/denies pain    Home Living Family/patient expects to be discharged to:: Private residence Living Arrangements:  (with ex-husbands family)                    Prior Function Level of Independence: Independent         Comments: Pt reports she was able to do everything she needed with no issues     Hand Dominance        Extremity/Trunk Assessment   Upper Extremity Assessment: Overall WFL for tasks assessed           Lower Extremity Assessment: Overall  WFL for tasks assessed         Communication   Communication: No difficulties  Cognition Arousal/Alertness: Awake/alert Behavior During Therapy: Anxious Overall Cognitive Status: Within Functional Limits for tasks assessed                      General Comments      Exercises        Assessment/Plan    PT Assessment Patent does not need any further PT services  PT Diagnosis Difficulty walking;Generalized weakness   PT Problem List    PT Treatment Interventions     PT Goals (Current goals can be found in the Care Plan section) Acute Rehab PT Goals Patient Stated Goal: "I don't know why I'm here, I want to go home"    Frequency     Barriers to discharge        Co-evaluation               End of Session Equipment Utilized During Treatment: Gait belt Activity Tolerance: Patient tolerated treatment well  Patient left: with bed alarm set Nurse Communication: Mobility status         Time: 1222-4114 PT Time Calculation (min) (ACUTE ONLY): 20 min   Charges:   PT Evaluation $Initial PT Evaluation Tier I: 1 Procedure     PT G Codes:       Wayne Both, PT, DPT [phone]  Kreg Shropshire 07/19/2015, 11:55 AM

## 2015-07-19 NOTE — Discharge Summary (Signed)
[facility] NAME: [name]    MR#:  [id number]  DATE OF BIRTH:  09/09/1976  DATE OF ADMISSION:  07/17/2015 ADMITTING PHYSICIAN: Lytle Butte, MD  DATE OF DISCHARGE: 07/19/15  PRIMARY CARE PHYSICIAN: Lucretia Kern., DO    ADMISSION DIAGNOSIS:  hallucination  DISCHARGE DIAGNOSIS:  Principal Problem:   Alcohol withdrawal delirium   SECONDARY DIAGNOSIS:   Past Medical History  Diagnosis Date  . Anxiety   . Cervical intraepithelial neoplasia III   . 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 H/O varicella   . Abnormal Pap smear     Age 76  . Yeast infection   . Bacterial infection   . Trichomonas   . HPV (human papilloma virus) anogenital infection   . 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     Age 3  . 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 Hx: UTI (urinary tract infection)     Back pain  . Irregular menstrual cycle   . H/O fatigue 2009  . Palpitations 2010  . Headache(784.0)   . Condyloma 2011  . Hx of dizziness 09/20/2011  . Pelvic pain in female 12/05/11  . IV drug user   . Alcohol abuse     HOSPITAL COURSE:   #1 alcoholic withdrawal with delirium: She was admitted to the stepdown unit and started on Precedex drip due to severe agitation and hallucination. Sedation was weaned and on the morning of discharge she was alert and oriented. She denied any symptoms of withdrawal no anxiety, diaphoresis. She did have a tremor. She received thiamine and folate. Blood pressure and heart rate were normal at the time of discharge. She was alert and oriented. She did not want to continue treatment of her withdrawal in the hospital. She was advised that she is at high risk for recurrence of hallucinations and also for additional complication such as seizures or respiratory suppression. She acknowledged this risk and insisted upon discharge. Clearly  this patient is at high risk for readmission.  #2 likely depression with possible psychosis: The patient was evaluated by psychiatry today while she was alert and oriented and was determined to have capacity. She denied suicidal or homicidal ideation. Psychiatry recommendation was for out patient therapy for alcohol detoxification and depression. She was provided with contact information for these resources and advised to follow up.  #3 alcoholic hepatitis: Liver enzyme levels decreasing as expected after alcohol cessation.  DISCHARGE CONDITIONS:   Stable  CONSULTS OBTAINED:  Treatment Team:  Dewain Penning, MD  DRUG ALLERGIES:  No Known Allergies  DISCHARGE MEDICATIONS:   Discharge Medication List as of 07/19/2015  1:56 PM    START taking these medications   Details  Multiple Vitamin (MULTIVITAMIN WITH MINERALS) TABS tablet Take 1 tablet by mouth daily., Starting 07/19/2015, Until Discontinued, Normal      STOP taking these medications     amphetamine-dextroamphetamine (ADDERALL) 20 MG tablet      Buprenorphine HCl-Naloxone HCl (ZUBSOLV) 5.7-1.4 MG SUBL          DISCHARGE INSTRUCTIONS:   Alcohol withdrawal is dangerous, you have a high risk of having recurrence of hallucinations as well as high risk of seizure and respiratory suppression.  You have been informed of this risk and have chosen to leave the hospital despite this risk.  If you have further symptoms you should return to the Emergency Department  immediately.  Please follow up with the outpatient resources recommended by Dr. Franchot Mimes.  DIET:  Regular diet  DISCHARGE CONDITION:  Serious  ACTIVITY:  Activity as tolerated  OXYGEN:  Home Oxygen: No.   Oxygen Delivery: room air  DISCHARGE LOCATION:  home   If you experience worsening of your admission symptoms, develop shortness of breath, life threatening emergency, suicidal or homicidal thoughts you must seek medical attention immediately by calling 911 or  calling your MD immediately  if symptoms less severe.  You Must read complete instructions/literature along with all the possible adverse reactions/side effects for all the Medicines you take and that have been prescribed to you. Take any new Medicines after you have completely understood and accpet all the possible adverse reactions/side effects.   Please note  You were cared for by a hospitalist during your hospital stay. If you have any questions about your discharge medications or the care you received while you were in the hospital after you are discharged, you can call the unit and asked to speak with the hospitalist on call if the hospitalist that took care of you is not available. Once you are discharged, your primary care physician will handle any further medical issues. Please note that NO REFILLS for any discharge medications will be authorized once you are discharged, as it is imperative that you return to your primary care physician (or establish a relationship with a primary care physician if you do not have one) for your aftercare needs so that they can reassess your need for medications and monitor your lab values.    Today   CHIEF COMPLAINT:   Chief Complaint  Patient presents with  . Hallucinations  . Alcohol Problem    HISTORY OF PRESENT ILLNESS:  [name] is a 39 y.o. female with a known history of alcohol abuse presenting with hallucinations. Patient unable to provide meaningful information given mental status/medical condition. History obtained from family members who are no longer present at bedside. Her husband brought her to the hospital after stating found her naked in the front yard actually as if she was hallucinating. States that she is a binge drinker and has not drank for about 2 days prior to onset of symptoms. No further information available at this time. Emergency department course she has received a total of 8 mg of Ativan, her symptoms are now better  controlled.  VITAL SIGNS:  Blood pressure 116/78, pulse 101, temperature 98.2 F (36.8 C), temperature source Oral, resp. rate 13, height 5\' 4"  (1.626 m), weight 53.5 kg (117 lb 15.1 oz), SpO2 100 %.  I/O:   Intake/Output Summary (Last 24 hours) at 07/19/15 1426 Last data filed at 07/19/15 1300  Gross per 24 hour  Intake 1862.99 ml  Output   1950 ml  Net -87.01 ml    PHYSICAL EXAMINATION:  GENERAL:  39 y.o.-year-old patient lying in the bed with no acute distress.  EYES: Pupils equal, round, reactive to light and accommodation. No scleral icterus. Extraocular muscles intact.  HEENT: Head atraumatic, normocephalic. Oropharynx and nasopharynx clear. Mucous membranes are moist NECK:  Supple, no jugular venous distention. No thyroid enlargement, no tenderness.  LUNGS: Normal breath sounds bilaterally, no wheezing, rales,rhonchi or crepitation. No use of accessory muscles of respiration.  CARDIOVASCULAR: S1, S2 normal. No murmurs, rubs, or gallops.  ABDOMEN: Soft, non-tender, non-distended. Bowel sounds present. No organomegaly or mass.  EXTREMITIES: No pedal edema, cyanosis, or clubbing.  NEUROLOGIC: Cranial nerves II through XII are intact. Muscle  strength 5/5 in all extremities. Sensation intact. Gait not checked.  PSYCHIATRIC: The patient is alert and oriented x 3. Calm SKIN: No obvious rash, lesion, or ulcer.   DATA REVIEW:   CBC  Recent Labs Lab 07/19/15 0708  WBC 4.4  HGB 12.0  HCT 35.8  PLT 119*    Chemistries   Recent Labs Lab 07/19/15 0708  NA 137  K 4.0  CL 105  CO2 25  GLUCOSE 169*  BUN 6  CREATININE 0.56  CALCIUM 8.9  AST 163*  ALT 89*  ALKPHOS 46  BILITOT 0.8    Cardiac Enzymes No results for input(s): TROPONINI in the last 168 hours.  Microbiology Results  Results for orders placed or performed during the hospital encounter of 07/17/15  MRSA PCR Screening     Status: None   Collection Time: 07/17/15  9:43 PM  Result Value Ref Range Status    MRSA by PCR NEGATIVE NEGATIVE Final    Comment:        The GeneXpert MRSA Assay (FDA approved for NASAL specimens only), is one component of a comprehensive MRSA colonization surveillance program. It is not intended to diagnose MRSA infection nor to guide or monitor treatment for MRSA infections.     RADIOLOGY:  No results found.  EKG:   Orders placed or performed during the hospital encounter of 07/17/15  . ED EKG  . ED EKG  . EKG 12-Lead  . EKG 12-Lead      Management plans discussed with the patient, family and they are in agreement.  CODE STATUS:     Code Status Orders        Start     Ordered   07/17/15 2031  Full code   Continuous     07/17/15 2031      TOTAL TIME TAKING CARE OF THIS PATIENT: 40 minutes.  Greater than 50% of time spent in care coordination and counseling. Case management discussed with nursing, psychiatry and the patient.  Myrtis Ser M.D on 07/19/2015 at 2:26 PM  Between 7am to 6pm - Pager - [phone]  After 6pm go to [email or web address] - password [email or web address] [facility]  Office  [phone]  CC: Primary care physician; Lucretia Kern., DO

## 2015-07-19 NOTE — Progress Notes (Signed)
Cleared by psych, to be d/c home

## 2016-05-09 ENCOUNTER — Encounter: Payer: Self-pay | Admitting: Emergency Medicine

## 2016-05-09 ENCOUNTER — Emergency Department
Admission: EM | Admit: 2016-05-09 | Discharge: 2016-05-09 | Disposition: A | Discharge status: Home / Self Care | Payer: Self-pay | Attending: Emergency Medicine | Admitting: Emergency Medicine

## 2016-05-09 DIAGNOSIS — B86 Scabies: Secondary | ICD-10-CM | POA: Insufficient documentation

## 2016-05-09 DIAGNOSIS — F17210 Nicotine dependence, cigarettes, uncomplicated: Secondary | ICD-10-CM | POA: Insufficient documentation

## 2016-05-09 MED ORDER — PERMETHRIN 5 % EX CREA: 1.0000 "application " | TOPICAL_CREAM | Freq: Once | CUTANEOUS | Status: DC

## 2016-05-09 NOTE — ED Notes (Signed)
Pt with rash to entire body; pt states "I know my body, this is scabies". Pt reports using hydrocortisone cream and benadryl cream without relief.

## 2016-05-09 NOTE — Discharge Instructions (Signed)

## 2016-05-09 NOTE — ED Provider Notes (Signed)
[facility] Emergency Department Provider Note  ____________________________________________  Time seen: Approximately 4:02 PM  I have reviewed the triage vital signs and the nursing notes.   HISTORY  Chief Complaint Rash    HPI [name] is a 40 y.o. female with complaints of a rash all over her entire body. Patient states that she thinks she got scabies previous exposure. Denies any other complaints at this time.   Past Medical History  Diagnosis Date  . Anxiety   . Cervical intraepithelial neoplasia III   . 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 H/O varicella   . Abnormal Pap smear     Age 54  . Yeast infection   . Bacterial infection   . Trichomonas   . HPV (human papilloma virus) anogenital infection   . 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     Age 39  . 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 Hx: UTI (urinary tract infection)     Back pain  . Irregular menstrual cycle   . H/O fatigue 2009  . Palpitations 2010  . Headache(784.0)   . Condyloma 2011  . Hx of dizziness 09/20/2011  . Pelvic pain in female 12/05/11  . IV drug user   . Alcohol abuse     Patient Active Problem List   Diagnosis Date Noted  . Alcohol withdrawal delirium ([place]) 07/17/2015  . Ascites 07/21/2014  . Alcohol withdrawal ([place]) 07/09/2014  . Alcoholism /alcohol abuse ([place]) 07/09/2014  . Abnormal computed tomography angiography (CTA) of abdomen and pelvis 07/09/2014  . Malnutrition of moderate degree ([place]) 07/09/2014  . Liver function study, abnormal 04/08/2014  . ETOH abuse 04/08/2014  . Palpitations - evaluated by Dr. Cathie Olden in the past 02/20/2014  . Anxiety 04/02/2012  . Drug dependence 03/18/2012  . 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    Past Surgical History  Procedure Laterality Date  . Conization cervix      x 3  . A*wisdom teeth ext    . Svd      x 1  . Cervical conization w/bx   11/30/2011    Procedure: CONIZATION CERVIX WITH BIOPSY;  Surgeon: [name], MD;  Location: [facility];  Service: Gynecology;  Laterality: N/A;  . Dilation and curettage of uterus  11/30/2011    Procedure: DILATATION AND CURETTAGE;  Surgeon: [name], MD;  Location: [facility];  Service: Gynecology;  Laterality: N/A;  . Cervical conization w/bx  03/16/2012    Procedure: CONIZATION CERVIX WITH BIOPSY;  Surgeon: Ena Dawley, MD;  Location: [facility];  Service: Gynecology;  Laterality: N/A;    Current Outpatient Rx  Name  Route  Sig  Dispense  Refill  . Multiple Vitamin (MULTIVITAMIN WITH MINERALS) TABS tablet   Oral   Take 1 tablet by mouth daily.   30 tablet   0   . permethrin (ELIMITE) 5 % cream   Topical   Apply 1 application topically once.   60 g   1     Allergies Review of patient's allergies indicates no known allergies.  Family History  Problem Relation Age of Onset  . Cervical cancer Mother   . Breast cancer Mother   . Cancer Mother 65    Breast & cervical   . Stroke Maternal Grandmother   . COPD Maternal Grandfather     Emphysema  . Hypertension Paternal Grandfather   . Stroke Paternal Grandfather   .  Cancer Father 25    prostate    Social History Social History  Substance Use Topics  . Smoking status: Current Every Day Smoker -- 1.00 packs/day for 23 years    Types: Cigarettes  . Smokeless tobacco: Current User  . Alcohol Use: Yes     Comment: socially; every other day after work     Review of Systems Constitutional: No fever/chills Cardiovascular: Denies chest pain. Respiratory: Denies shortness of breath. Gastrointestinal: No abdominal pain.  No nausea, no vomiting.  No diarrhea.  No constipation. Musculoskeletal: Negative for back pain. Skin: Positive for rash. Neurological: Negative for headaches, focal weakness or numbness.  10-point ROS otherwise negative.  ____________________________________________   PHYSICAL EXAM:  VITAL  SIGNS: ED Triage Vitals  Enc Vitals Group     BP 05/09/16 1532 123/75 mmHg     Pulse Rate 05/09/16 1532 103     Resp 05/09/16 1532 20     Temp 05/09/16 1532 98.8 F (37.1 C)     Temp Source 05/09/16 1532 Oral     SpO2 05/09/16 1532 98 %     Weight 05/09/16 1532 117 lb (53.071 kg)     Height 05/09/16 1532 5' (1.524 m)     Head Cir --      Peak Flow --      Pain Score --      Pain Loc --      Pain Edu? --      Excl. in [place]? --     Constitutional: Alert and oriented. Well appearing and in no acute distress. Cardiovascular: Normal rate, regular rhythm. Grossly normal heart sounds.  Good peripheral circulation. Respiratory: Normal respiratory effort.  No retractions. Lungs CTAB. Neurologic:  Normal speech and language. No gross focal neurologic deficits are appreciated. No gait instability. Skin:  Positive rash noted within all over her body. Linear formation various stages scab-like symptoms no vesicles or papules noted. Psychiatric: Mood and affect are normal. Speech and behavior are normal.  ____________________________________________   LABS (all labs ordered are listed, but only abnormal results are displayed)  Labs Reviewed - No data to display   PROCEDURES  Procedure(s) performed: None  Critical Care performed: No  ____________________________________________   INITIAL IMPRESSION / ASSESSMENT AND PLAN / ED COURSE  Pertinent labs & imaging results that were available during my care of the patient were reviewed by me and considered in my medical decision making (see chart for details).  Acute exacerbation of scabies. Rx given for permethrin lotion cream. Patient to use at work for 48 hours and follow up with PCP or return to the ER with any worsening symptomology. ____________________________________________   FINAL CLINICAL IMPRESSION(S) / ED DIAGNOSES  Final diagnoses:  Scabies infestation     This chart was dictated using voice recognition  software/Dragon. Despite best efforts to proofread, errors can occur which can change the meaning. Any change was purely unintentional.   Arlyss Repress, PA-C 05/09/16 1604  Lisa Roca, MD 05/09/16 1606

## 2016-06-11 ENCOUNTER — Encounter: Payer: Self-pay | Admitting: *Deleted

## 2016-06-11 ENCOUNTER — Emergency Department
Admission: EM | Admit: 2016-06-11 | Discharge: 2016-06-11 | Disposition: A | Discharge status: Home / Self Care | Payer: Self-pay | Attending: Emergency Medicine | Admitting: Emergency Medicine

## 2016-06-11 DIAGNOSIS — F17210 Nicotine dependence, cigarettes, uncomplicated: Secondary | ICD-10-CM | POA: Insufficient documentation

## 2016-06-11 DIAGNOSIS — F1010 Alcohol abuse, uncomplicated: Secondary | ICD-10-CM | POA: Insufficient documentation

## 2016-06-11 DIAGNOSIS — Z8541 Personal history of malignant neoplasm of cervix uteri: Secondary | ICD-10-CM | POA: Insufficient documentation

## 2016-06-11 DIAGNOSIS — Z79899 Other long term (current) drug therapy: Secondary | ICD-10-CM | POA: Insufficient documentation

## 2016-06-11 MED ORDER — CHLORDIAZEPOXIDE HCL 25 MG PO CAPS: ORAL_CAPSULE | ORAL | Status: DC

## 2016-06-11 NOTE — ED Notes (Signed)
Pt. States husband picking her up in waiting room.

## 2016-06-11 NOTE — Discharge Instructions (Signed)
Please seek medical attention for any high fevers, chest pain, shortness of breath, change in behavior, persistent vomiting, bloody stool or any other new or concerning symptoms. ° ° °Alcohol Use Disorder °Alcohol use disorder is a mental disorder. It is not a one-time incident of heavy drinking. Alcohol use disorder is the excessive and uncontrollable use of alcohol over time that leads to problems with functioning in one or more areas of daily living. People with this disorder risk harming themselves and others when they drink to excess. Alcohol use disorder also can cause other mental disorders, such as mood and anxiety disorders, and serious physical problems. People with alcohol use disorder often misuse other drugs.  °Alcohol use disorder is common and widespread. Some people with this disorder drink alcohol to cope with or escape from negative life events. Others drink to relieve chronic pain or symptoms of mental illness. People with a family history of alcohol use disorder are at higher risk of losing control and using alcohol to excess.  °Drinking too much alcohol can cause injury, accidents, and health problems. One drink can be too much when you are: °· Working. °· Pregnant or breastfeeding. °· Taking medicines. Ask your doctor. °· Driving or planning to drive. °SYMPTOMS  °Signs and symptoms of alcohol use disorder may include the following:  °· Consumption of alcohol in larger amounts or over a longer period of time than intended. °· Multiple unsuccessful attempts to cut down or control alcohol use.   °· A great deal of time spent obtaining alcohol, using alcohol, or recovering from the effects of alcohol (hangover). °· A strong desire or urge to use alcohol (cravings).   °· Continued use of alcohol despite problems at work, school, or home because of alcohol use.   °· Continued use of alcohol despite problems in relationships because of alcohol use. °· Continued use of alcohol in situations when it is  physically hazardous, such as driving a car. °· Continued use of alcohol despite awareness of a physical or psychological problem that is likely related to alcohol use. Physical problems related to alcohol use can involve the brain, heart, liver, stomach, and intestines. Psychological problems related to alcohol use include intoxication, depression, anxiety, psychosis, delirium, and dementia.   °· The need for increased amounts of alcohol to achieve the same desired effect, or a decreased effect from the consumption of the same amount of alcohol (tolerance). °· Withdrawal symptoms upon reducing or stopping alcohol use, or alcohol use to reduce or avoid withdrawal symptoms. Withdrawal symptoms include: °¨ Racing heart. °¨ Hand tremor. °¨ Difficulty sleeping. °¨ Nausea. °¨ Vomiting. °¨ Hallucinations. °¨ Restlessness. °¨ Seizures. °DIAGNOSIS °Alcohol use disorder is diagnosed through an assessment by your health care provider. Your health care provider may start by asking three or four questions to screen for excessive or problematic alcohol use. To confirm a diagnosis of alcohol use disorder, at least two symptoms must be present within a 12-month period. The severity of alcohol use disorder depends on the number of symptoms: °· Mild--two or three. °· Moderate--four or five. °· Severe--six or more. °Your health care provider may perform a physical exam or use results from lab tests to see if you have physical problems resulting from alcohol use. Your health care provider may refer you to a mental health professional for evaluation. °TREATMENT  °Some people with alcohol use disorder are able to reduce their alcohol use to low-risk levels. Some people with alcohol use disorder need to quit drinking alcohol. When necessary, mental health professionals with specialized   training in substance use treatment can help. Your health care provider can help you decide how severe your alcohol use disorder is and what type of  treatment you need. The following forms of treatment are available:  °· Detoxification. Detoxification involves the use of prescription medicines to prevent alcohol withdrawal symptoms in the first week after quitting. This is important for people with a history of symptoms of withdrawal and for heavy drinkers who are likely to have withdrawal symptoms. Alcohol withdrawal can be dangerous and, in severe cases, cause death. Detoxification is usually provided in a hospital or in-patient substance use treatment facility. °· Counseling or talk therapy. Talk therapy is provided by substance use treatment counselors. It addresses the reasons people use alcohol and ways to keep them from drinking again. The goals of talk therapy are to help people with alcohol use disorder find healthy activities and ways to cope with life stress, to identify and avoid triggers for alcohol use, and to handle cravings, which can cause relapse. °· Medicines. Different medicines can help treat alcohol use disorder through the following actions: °¨ Decrease alcohol cravings. °¨ Decrease the positive reward response felt from alcohol use. °¨ Produce an uncomfortable physical reaction when alcohol is used (aversion therapy). °· Support groups. Support groups are run by people who have quit drinking. They provide emotional support, advice, and guidance. °These forms of treatment are often combined. Some people with alcohol use disorder benefit from intensive combination treatment provided by specialized substance use treatment centers. Both inpatient and outpatient treatment programs are available. °  °This information is not intended to replace advice given to you by your health care provider. Make sure you discuss any questions you have with your health care provider. °  °Document Released: 01/12/2005 Document Revised: 12/26/2014 Document Reviewed: 03/14/2013 °Elsevier Interactive Patient Education ©2016 [employer] ° °

## 2016-06-11 NOTE — ED Provider Notes (Signed)
[facility] Emergency Department Provider Note    ____________________________________________  Time seen: ~1950  I have reviewed the triage vital signs and the nursing notes.   HISTORY  Chief Complaint Alcohol Problem   History limited by: Not Limited   HPI [name] is a 40 y.o. female who presents to the emergency department today because of concern for alcohol withdrawal. Patient states she has been drinking wine. She has a history of complicated withdrawal in the past and did not want to let it get to that point. States her last drink was this morning. Has also been using cocaine. Denies any current hallucinations or seizures. She is waiting for a bed at [facility].     Past Medical History  Diagnosis Date  . Anxiety   . Cervical intraepithelial neoplasia III   . 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 H/O varicella   . Abnormal Pap smear     Age 79  . Yeast infection   . Bacterial infection   . Trichomonas   . HPV (human papilloma virus) anogenital infection   . 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     Age 60  . 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 Hx: UTI (urinary tract infection)     Back pain  . Irregular menstrual cycle   . H/O fatigue 2009  . Palpitations 2010  . Headache(784.0)   . Condyloma 2011  . Hx of dizziness 09/20/2011  . Pelvic pain in female 12/05/11  . IV drug user   . Alcohol abuse     Patient Active Problem List   Diagnosis Date Noted  . Alcohol withdrawal delirium ([place]) 07/17/2015  . Ascites 07/21/2014  . Alcohol withdrawal ([place]) 07/09/2014  . Alcoholism /alcohol abuse ([place]) 07/09/2014  . Abnormal computed tomography angiography (CTA) of abdomen and pelvis 07/09/2014  . Malnutrition of moderate degree ([place]) 07/09/2014  . Liver function study, abnormal 04/08/2014  . ETOH abuse 04/08/2014  . Palpitations - evaluated by Dr. Cathie Olden in the past 02/20/2014  .  Anxiety 04/02/2012  . Drug dependence 03/18/2012  . 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    Past Surgical History  Procedure Laterality Date  . Conization cervix      x 3  . A*wisdom teeth ext    . Svd      x 1  . Cervical conization w/bx  11/30/2011    Procedure: CONIZATION CERVIX WITH BIOPSY;  Surgeon: [name], MD;  Location: [facility];  Service: Gynecology;  Laterality: N/A;  . Dilation and curettage of uterus  11/30/2011    Procedure: DILATATION AND CURETTAGE;  Surgeon: [name], MD;  Location: [facility];  Service: Gynecology;  Laterality: N/A;  . Cervical conization w/bx  03/16/2012    Procedure: CONIZATION CERVIX WITH BIOPSY;  Surgeon: Ena Dawley, MD;  Location: [facility];  Service: Gynecology;  Laterality: N/A;    Current Outpatient Rx  Name  Route  Sig  Dispense  Refill  . amphetamine-dextroamphetamine (ADDERALL) 20 MG tablet   Oral   Take 20 mg by mouth 3 (three) times daily.      0   . hydrOXYzine (VISTARIL) 25 MG capsule   Oral   Take 25 mg by mouth 3 (three) times daily as needed. For anxiety.      0   . propranolol (INDERAL) 40 MG tablet   Oral   Take 40 mg by mouth every morning.  0   . sertraline (ZOLOFT) 100 MG tablet   Oral   Take 100 mg by mouth every morning.       0   . SUBOXONE 8-2 MG FILM   Sublingual   Place 2 Film under the tongue every morning.      0     Dispense as written.   . chlordiazePOXIDE (LIBRIUM) 25 MG capsule      Day 1 - 50 mg oral twice a day Day 2 - 25 mg oral every 6 hours Day 3 - 25 mg oral twice a day Day 4 - 25 mg oral at bedtime   11 capsule   0   . Multiple Vitamin (MULTIVITAMIN WITH MINERALS) TABS tablet   Oral   Take 1 tablet by mouth daily.   30 tablet   0   . permethrin (ELIMITE) 5 % cream   Topical   Apply 1 application topically once.   60 g   1     Allergies Review of patient's allergies indicates no known allergies.  Family History  Problem Relation Age of  Onset  . Cervical cancer Mother   . Breast cancer Mother   . Cancer Mother 76    Breast & cervical   . Stroke Maternal Grandmother   . COPD Maternal Grandfather     Emphysema  . Hypertension Paternal Grandfather   . Stroke Paternal Grandfather   . Cancer Father 81    prostate    Social History Social History  Substance Use Topics  . Smoking status: Current Every Day Smoker -- 1.00 packs/day for 23 years    Types: Cigarettes  . Smokeless tobacco: Current User  . Alcohol Use: Yes     Comment: socially; every other day after work     Review of Systems  Constitutional: Negative for fever. Cardiovascular: Negative for chest pain. Respiratory: Negative for shortness of breath. Gastrointestinal: Negative for abdominal pain, vomiting and diarrhea. Genitourinary: Negative for dysuria. Musculoskeletal: Negative for back pain. Skin: Negative for rash. Neurological: Negative for headaches, focal weakness or numbness.  10-point ROS otherwise negative.  ____________________________________________   PHYSICAL EXAM:  VITAL SIGNS: ED Triage Vitals  Enc Vitals Group     BP 06/11/16 1755 126/77 mmHg     Pulse Rate 06/11/16 1755 78     Resp 06/11/16 1755 18     Temp 06/11/16 1755 98 F (36.7 C)     Temp Source 06/11/16 1755 Oral     SpO2 06/11/16 1755 99 %     Weight 06/11/16 1755 125 lb (56.7 kg)     Height 06/11/16 1755 5\' 1"  (1.549 m)     Head Cir --      Peak Flow --      Pain Score --      Pain Loc --      Pain Edu? --      Excl. in [place]? --      Constitutional: Alert and oriented. Well appearing and in no distress. Eyes: Conjunctivae are normal. PERRL. Normal extraocular movements. ENT   Head: Normocephalic and atraumatic.   Nose: No congestion/rhinnorhea.   Mouth/Throat: Mucous membranes are moist.   Neck: No stridor. Hematological/Lymphatic/Immunilogical: No cervical lymphadenopathy. Cardiovascular: Normal rate, regular rhythm.  No murmurs, rubs,  or gallops. Respiratory: Normal respiratory effort without tachypnea nor retractions. Breath sounds are clear and equal bilaterally. No wheezes/rales/rhonchi. Gastrointestinal: Soft and nontender. No distention.  Genitourinary: Deferred Musculoskeletal: Normal range of motion in all extremities.  No joint effusions.  Neurologic:  Normal speech and language. No gross focal neurologic deficits are appreciated. No tremors.  Skin:  Skin is warm, dry and intact. No rash noted. Psychiatric: Mood and affect are normal. Speech and behavior are normal. Patient exhibits appropriate insight and judgment.  ____________________________________________    LABS (pertinent positives/negatives)  None  ____________________________________________   EKG  None  ____________________________________________    RADIOLOGY  None  ____________________________________________   PROCEDURES  Procedure(s) performed: None  Critical Care performed: No  ____________________________________________   INITIAL IMPRESSION / ASSESSMENT AND PLAN / ED COURSE  Pertinent labs & imaging results that were available during my care of the patient were reviewed by me and considered in my medical decision making (see chart for details).  She presents to the emergency department today because of concerns for alcohol withdrawal. She has been drinking wine last week was this point. She is working on getting a bed at an outside rehabilitation facility. At this point no signs of active withdrawal. Patient was amenable to try Librium taper. Did discuss return precautions.  ____________________________________________   FINAL CLINICAL IMPRESSION(S) / ED DIAGNOSES  Final diagnoses:  Alcohol abuse     Note: This dictation was prepared with Dragon dictation. Any transcriptional errors that result from this process are unintentional    Nance Pear, MD 06/11/16 2017

## 2016-06-11 NOTE — ED Notes (Signed)
Pt. States" I drink about a gallon of wine a day"  Pt. States she also uses cocaine on a daily basis"  Pt. States she is on the [employer] Act waiting list.  Pt. States she does not believe she can wait a couple more days.  Pt. Also states she also takes soboxone on a daily basis and wants to go to a facility that will allow her to continue.  Pt. States her last drink was this a.m.  Pt. States inpatient treatment for detox was last year.

## 2016-06-11 NOTE — ED Notes (Signed)
Pt arrives seeking detox from ETOH, last drink was this AM, states she has been to detox last year, denies any drug abuse

## 2016-06-11 NOTE — ED Notes (Signed)
Pt. States "mother may be given healthcare information".

## 2016-09-01 DIAGNOSIS — F1421 Cocaine dependence, in remission: Secondary | ICD-10-CM | POA: Insufficient documentation

## 2016-09-01 DIAGNOSIS — F609 Personality disorder, unspecified: Secondary | ICD-10-CM | POA: Insufficient documentation

## 2016-09-01 DIAGNOSIS — F1420 Cocaine dependence, uncomplicated: Secondary | ICD-10-CM | POA: Insufficient documentation

## 2017-08-22 ENCOUNTER — Ambulatory Visit: Payer: Self-pay

## 2017-09-20 ENCOUNTER — Encounter: Payer: Self-pay | Admitting: *Deleted

## 2017-09-20 ENCOUNTER — Ambulatory Visit: Payer: Self-pay | Attending: Oncology | Admitting: *Deleted

## 2017-09-20 ENCOUNTER — Ambulatory Visit
Admission: RE | Admit: 2017-09-20 | Discharge: 2017-09-20 | Disposition: A | Discharge status: Home / Self Care | Payer: Self-pay | Source: Ambulatory Visit | Attending: Oncology | Admitting: Oncology

## 2017-09-20 VITALS — BP 133/81 | HR 53 | Temp 97.9°F | Ht 63.0 in | Wt 140.0 lb

## 2017-09-20 DIAGNOSIS — Z0000 Encounter for general adult medical examination without abnormal findings: Secondary | ICD-10-CM

## 2017-09-20 NOTE — Progress Notes (Signed)
Subjective:     Patient ID: [name], female   DOB: December 01, 1976, 41 y.o.   MRN: [id number]  HPI   Review of Systems     Objective:   Physical Exam  Pulmonary/Chest: Right breast exhibits no inverted nipple, no mass, no nipple discharge, no skin change and no tenderness. Left breast exhibits no inverted nipple, no mass, no nipple discharge, no skin change and no tenderness. Breasts are symmetrical.  Abdominal: There is no splenomegaly or hepatomegaly.  Genitourinary: No labial fusion. There is no rash, tenderness, lesion or injury on the right labia. There is no rash, tenderness, lesion or injury on the left labia. Cervix exhibits no motion tenderness, no discharge and no friability. Right adnexum displays no mass, no tenderness and no fullness. Left adnexum displays no mass, no tenderness and no fullness. No erythema, tenderness or bleeding in the vagina. No foreign body in the vagina. No signs of injury around the vagina. Vaginal discharge found.    Genitourinary Comments: Old dark brown bloody discharge noted.  Patient states her period just ended 2 days ago.       Assessment:     41 year old White female presents to [facility] for clinical breast exam, pap and baseline mammogram.  Per notes in Epic patient had a cervical cancer insitu in 2014.  Patient states she has had multiple "surgeries" on her cervix.  Last pap 07/21/14 was negative with no HPV co-testing.  Family history includes her mom with breast cancer in her 15.  Clinical breast exam unremarkable.  Taught self breast awareness.  Specimen collected for pap with a little difficulty.  Patient was very uncomfortable during the exam.  Patient has been screened for eligibility.  She does not have any insurance, Medicare or Medicaid.  She also meets financial eligibility.  Hand-out given on the Affordable Care Act.    Plan:     Screening mammogram ordered.  Specimen for pap sent to the lab.  Will follow-up per [facility] protocol.

## 2017-09-20 NOTE — Patient Instructions (Signed)
Gave patient hand-out, Women Staying Healthy, Active and Well from [facility], with education on breast health, pap smears, heart and colon health. 

## 2017-09-22 LAB — PAP LB AND HPV HIGH-RISK
HPV, high-risk: NEGATIVE
PAP Smear Comment: 0

## 2017-09-27 ENCOUNTER — Encounter: Payer: Self-pay | Admitting: *Deleted

## 2017-09-27 NOTE — Progress Notes (Signed)
Letter mailed to inform patient of her normal mammogram and pap results.  She is to follow-up in 1 year for her mammogram and pap smear in 5 years.  [facility] to [facility].

## 2018-06-06 ENCOUNTER — Other Ambulatory Visit: Payer: Self-pay

## 2018-06-06 ENCOUNTER — Emergency Department
Admission: EM | Admit: 2018-06-06 | Discharge: 2018-06-06 | Disposition: A | Discharge status: Home / Self Care | Payer: Self-pay | Attending: Emergency Medicine | Admitting: Emergency Medicine

## 2018-06-06 ENCOUNTER — Encounter: Payer: Self-pay | Admitting: Emergency Medicine

## 2018-06-06 DIAGNOSIS — F17210 Nicotine dependence, cigarettes, uncomplicated: Secondary | ICD-10-CM | POA: Insufficient documentation

## 2018-06-06 DIAGNOSIS — F419 Anxiety disorder, unspecified: Secondary | ICD-10-CM | POA: Insufficient documentation

## 2018-06-06 DIAGNOSIS — F1010 Alcohol abuse, uncomplicated: Secondary | ICD-10-CM | POA: Insufficient documentation

## 2018-06-06 DIAGNOSIS — Z79899 Other long term (current) drug therapy: Secondary | ICD-10-CM | POA: Insufficient documentation

## 2018-06-06 DIAGNOSIS — R21 Rash and other nonspecific skin eruption: Secondary | ICD-10-CM | POA: Insufficient documentation

## 2018-06-06 DIAGNOSIS — Z86001 Personal history of in-situ neoplasm of cervix uteri: Secondary | ICD-10-CM | POA: Insufficient documentation

## 2018-06-06 LAB — CBC WITH DIFFERENTIAL/PLATELET
Basophils Absolute: 0 10*3/uL (ref 0–0.1)
Basophils Relative: 0 %
Eosinophils Absolute: 0.2 10*3/uL (ref 0–0.7)
Eosinophils Relative: 4 %
HCT: 37.6 % (ref 35.0–47.0)
Hemoglobin: 13.2 g/dL (ref 12.0–16.0)
Lymphocytes Relative: 30 %
Lymphs Abs: 1.4 10*3/uL (ref 1.0–3.6)
MCH: 34.9 pg — ABNORMAL HIGH (ref 26.0–34.0)
MCHC: 35.1 g/dL (ref 32.0–36.0)
MCV: 99.2 fL (ref 80.0–100.0)
Monocytes Absolute: 0.6 10*3/uL (ref 0.2–0.9)
Monocytes Relative: 13 %
Neutro Abs: 2.3 10*3/uL (ref 1.4–6.5)
Neutrophils Relative %: 53 %
Platelets: 211 10*3/uL (ref 150–440)
RBC: 3.78 MIL/uL — ABNORMAL LOW (ref 3.80–5.20)
RDW: 13.2 % (ref 11.5–14.5)
WBC: 4.5 10*3/uL (ref 3.6–11.0)

## 2018-06-06 LAB — COMPREHENSIVE METABOLIC PANEL
ALT: 49 U/L (ref 14–54)
AST: 89 U/L — ABNORMAL HIGH (ref 15–41)
Albumin: 3.9 g/dL (ref 3.5–5.0)
Alkaline Phosphatase: 61 U/L (ref 38–126)
Anion gap: 9 (ref 5–15)
BUN: 6 mg/dL (ref 6–20)
CO2: 23 mmol/L (ref 22–32)
Calcium: 8.6 mg/dL — ABNORMAL LOW (ref 8.9–10.3)
Chloride: 99 mmol/L — ABNORMAL LOW (ref 101–111)
Creatinine, Ser: 0.54 mg/dL (ref 0.44–1.00)
GFR calc Af Amer: >60 mL/min (ref >60)
GFR calc non Af Amer: >60 mL/min (ref >60)
Glucose, Bld: 97 mg/dL (ref 65–99)
Potassium: 4.2 mmol/L (ref 3.5–5.1)
Sodium: 131 mmol/L — ABNORMAL LOW (ref 135–145)
Total Bilirubin: 0.7 mg/dL (ref 0.3–1.2)
Total Protein: 7.1 g/dL (ref 6.5–8.1)

## 2018-06-06 MED ORDER — DOXYCYCLINE HYCLATE 100 MG PO CAPS: 100.0000 mg | ORAL_CAPSULE | Freq: Two times a day (BID) | ORAL | 0 refills | Status: AC

## 2018-06-06 MED ORDER — PREDNISONE 50 MG PO TABS: ORAL_TABLET | ORAL | 0 refills | Status: DC

## 2018-06-06 NOTE — ED Notes (Signed)
ED Provider at bedside. 

## 2018-06-06 NOTE — ED Triage Notes (Signed)
PT to ED via POV with c/o tick bite x3wks ago RT ear, pt states she has had persistant rash to RT arm since. VSS Denies fever

## 2018-06-06 NOTE — ED Provider Notes (Signed)
[facility] Emergency Department Provider Note  ____________________________________________  Time seen: Approximately 6:28 PM  I have reviewed the triage vital signs and the nursing notes.   HISTORY  Chief Complaint No chief complaint on file.    HPI [name] is a 42 y.o. female presents to the emergency department with an erythematous, maculopapular rash along the posterior aspect of the right forearm that has been present for approximately 3 weeks.  Patient reports that she had a small amount of blood on her ear and found a tick in her bathroom and assumed that she had been bitten.  Patient explicitly stated that she did not remove a tick from her ear.  Patient reports that since incident, rash has continued to progressively worsen.  Patient has felt fatigued and has associated headache, low-grade fever and myalgias.  Patient denies hiking or recreational activities outside.  She denies diarrhea or emesis.  No recent travel.  Patient does report that pets and family members could have brought something in on their clothing or for from outside the home.  Nobody in patient's home has similar symptoms.  Patient has never experienced similar rash in the past.  She denies new contact exposures with linens, laundry soap, foods or other new materials.  Patient has not addressed current rash with primary care.  Patient has been using oral Benadryl as well as Benadryl cream and reports that medications help with pruritus but does not improve overall rash.   Past Medical History:  Diagnosis Date  . Abnormal Pap smear    Age 1  . 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 Alcohol abuse   . Anxiety   . Bacterial infection   . Cervical intraepithelial neoplasia III   . 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 Condyloma 2011  . H/O fatigue 2009  . H/O varicella   . Headache(784.0)   . HPV (human  papilloma virus) anogenital infection   . Hx of dizziness 09/20/2011  . Hx: UTI (urinary tract infection)    Back pain  . Irregular menstrual cycle   . IV drug user   . Palpitations 2010  . Pelvic pain in female 12/05/11  . 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    Age 13  . Trichomonas   . Yeast infection     Patient Active Problem List   Diagnosis Date Noted  . Alcohol withdrawal delirium ([place]) 07/17/2015  . Ascites 07/21/2014  . Alcohol withdrawal ([place]) 07/09/2014  . Alcoholism /alcohol abuse ([place]) 07/09/2014  . Abnormal computed tomography angiography (CTA) of abdomen and pelvis 07/09/2014  . Malnutrition of moderate degree ([place]) 07/09/2014  . Liver function study, abnormal 04/08/2014  . ETOH abuse 04/08/2014  . Palpitations - evaluated by Dr. Cathie Olden in the past 02/20/2014  . Anxiety 04/02/2012  . Drug dependence 03/18/2012  . 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    Past Surgical History:  Procedure Laterality Date  . A*wisdom teeth ext    . CERVICAL CONIZATION W/BX  11/30/2011   Procedure: CONIZATION CERVIX WITH BIOPSY;  Surgeon: [name], MD;  Location: [facility];  Service: Gynecology;  Laterality: N/A;  . CERVICAL CONIZATION W/BX  03/16/2012   Procedure: CONIZATION CERVIX WITH BIOPSY;  Surgeon: Ena Dawley, MD;  Location: [facility];  Service: Gynecology;  Laterality: N/A;  . CONIZATION CERVIX     x 3  . DILATION AND CURETTAGE OF UTERUS  11/30/2011   Procedure: DILATATION  AND CURETTAGE;  Surgeon: [name], MD;  Location: [facility];  Service: Gynecology;  Laterality: N/A;  . svd     x 1    Prior to Admission medications   Medication Sig Start Date End Date Taking? Authorizing Provider  amphetamine-dextroamphetamine (ADDERALL) 20 MG tablet Take 20 mg by mouth 3 (three) times daily. 05/15/16   [provider]  chlordiazePOXIDE (LIBRIUM) 25 MG capsule Day 1 - 50 mg oral twice a day Day 2 - 25 mg oral every 6 hours Day 3 - 25 mg oral twice a day Day 4 - 25  mg oral at bedtime 06/11/16   Nance Pear, MD  doxycycline (VIBRAMYCIN) 100 MG capsule Take 1 capsule (100 mg total) by mouth 2 (two) times daily for 7 days. 06/06/18 06/13/18  Lannie Fields, PA-C  hydrOXYzine (VISTARIL) 25 MG capsule Take 25 mg by mouth 3 (three) times daily as needed. For anxiety. 05/08/16   [provider]  Multiple Vitamin (MULTIVITAMIN WITH MINERALS) TABS tablet Take 1 tablet by mouth daily. 07/19/15   Aldean Jewett, MD  permethrin (ELIMITE) 5 % cream Apply 1 application topically once. 05/09/16   Beers, Pierce Crane, PA-C  predniSONE (DELTASONE) 50 MG tablet Take one 50 mg tablet once daily for the next five days. 06/06/18   Lannie Fields, PA-C  propranolol (INDERAL) 40 MG tablet Take 40 mg by mouth every morning.  05/25/16   [provider]  sertraline (ZOLOFT) 100 MG tablet Take 100 mg by mouth every morning.  05/08/16   [provider]  SUBOXONE 8-2 MG FILM Place 2 Film under the tongue every morning. 06/10/16   [provider]    Allergies Patient has no known allergies.  Family History  Problem Relation Age of Onset  . Cervical cancer Mother   . Breast cancer Mother   . Cancer Mother 30       Breast & cervical   . Stroke Maternal Grandmother   . COPD Maternal Grandfather        Emphysema  . Hypertension Paternal Grandfather   . Stroke Paternal Grandfather   . Cancer Father 70       prostate    Social History Social History   Tobacco Use  . Smoking status: Current Every Day Smoker    Packs/day: 1.00    Years: 23.00    Pack years: 23.00    Types: Cigarettes  . Smokeless tobacco: Current User  Substance Use Topics  . Alcohol use: Yes    Comment: socially; every other day after work   . Drug use: No    Types: Heroin    Comment: Abused rx drugs - on methadone 08/2011     Review of Systems  Constitutional: No fever/chills Eyes: No visual changes. No discharge ENT: No upper respiratory  complaints. Cardiovascular: no chest pain. Respiratory: no cough. No SOB. Gastrointestinal: No abdominal pain.  No nausea, no vomiting.  No diarrhea.  No constipation. Musculoskeletal: Negative for musculoskeletal pain. Skin: Patient has an erythematous, maculopapular rash. Neurological: Negative for headaches, focal weakness or numbness.  ____________________________________________   PHYSICAL EXAM:  VITAL SIGNS: ED Triage Vitals  Enc Vitals Group     BP 06/06/18 1701 125/80     Pulse Rate 06/06/18 1701 70     Resp 06/06/18 1659 16     Temp 06/06/18 1659 98.1 F (36.7 C)     Temp Source 06/06/18 1659 Oral     SpO2 06/06/18 1659  99 %     Weight 06/06/18 1700 138 lb (62.6 kg)     Height 06/06/18 1700 5' (1.524 m)     Head Circumference --      Peak Flow --      Pain Score 06/06/18 1700 5     Pain Loc --      Pain Edu? --      Excl. in [place]? --      Constitutional: Alert and oriented. Well appearing and in no acute distress. Eyes: Conjunctivae are normal. PERRL. EOMI. Head: Atraumatic. ENT:      Ears: TMs are pearly.      Nose: No congestion/rhinnorhea.      Mouth/Throat: Mucous membranes are moist.  Neck: No stridor.  No cervical spine tenderness to palpation.  Cardiovascular: Normal rate, regular rhythm. Normal S1 and S2.  Good peripheral circulation. Respiratory: Normal respiratory effort without tachypnea or retractions. Lungs CTAB. Good air entry to the bases with no decreased or absent breath sounds. Gastrointestinal: Bowel sounds 4 quadrants. Soft and nontender to palpation. No guarding or rigidity. No palpable masses. No distention. No CVA tenderness. Musculoskeletal: Full range of motion to all extremities. No gross deformities appreciated. Neurologic:  Normal speech and language. No gross focal neurologic deficits are appreciated.  Skin: Patient has an erythematous, maculopapular rash that extends from posterior aspect of right shoulder to right forearm, just  below the elbow.  Rash has no scab formation but does have a mild scaling quality to it.  Borders are irregular.  There is no region and central clearance.  No target lesions. Psychiatric: Mood and affect are normal. Speech and behavior are normal. Patient exhibits appropriate insight and judgement.   ____________________________________________   LABS (all labs ordered are listed, but only abnormal results are displayed)  Labs Reviewed  CBC WITH DIFFERENTIAL/PLATELET - Abnormal; Notable for the following components:      Result Value   RBC 3.78 (*)    MCH 34.9 (*)    All other components within normal limits  COMPREHENSIVE METABOLIC PANEL - Abnormal; Notable for the following components:   Sodium 131 (*)    Chloride 99 (*)    Calcium 8.6 (*)    AST 89 (*)    All other components within normal limits   ____________________________________________  EKG   ____________________________________________  RADIOLOGY   No results found.  ____________________________________________    PROCEDURES  Procedure(s) performed:    Procedures    Medications - No data to display   ____________________________________________   INITIAL IMPRESSION / ASSESSMENT AND PLAN / ED COURSE  Pertinent labs & imaging results that were available during my care of the patient were reviewed by me and considered in my medical decision making (see chart for details).  Review of the  CSRS was performed in accordance of the [place] prior to dispensing any controlled drugs.      Assessment and plan Tick bite Patient presents to the emergency department with an erythematous, maculopapular rash along the posterior aspect of the right arm from right shoulder to right elbow.  Differential diagnosis originally included contact dermatitis, drug-induced rash, [place] spotted fever.  CBC and CMP conducted in the emergency department were reassuring other than mild hyponatremia.  Patient refused an  injection of Kenalog in the emergency department and opted to take prednisone orally.  Patient was treated empirically with doxycycline and patient education regarding photosensitivity was given.  Patient education was provided regarding the potential for rebound rash if source  of rash is contact dermatitis from poison ivy/poison oak.  Patient still opted for a shorter course of prednisone.  Vital signs were reassuring prior to discharge.  All patient questions were answered.    ____________________________________________  FINAL CLINICAL IMPRESSION(S) / ED DIAGNOSES  Final diagnoses:  Rash      NEW MEDICATIONS STARTED DURING THIS VISIT:  ED Discharge Orders        Ordered    predniSONE (DELTASONE) 50 MG tablet     06/06/18 1822    doxycycline (VIBRAMYCIN) 100 MG capsule  2 times daily     06/06/18 1822          This chart was dictated using voice recognition software/Dragon. Despite best efforts to proofread, errors can occur which can change the meaning. Any change was purely unintentional.    Lannie Fields, PA-C 06/06/18 Milinda Antis, MD 06/06/18 [phone]

## 2018-06-06 NOTE — ED Notes (Addendum)
Pt not certain she was bitten by tick. Saw tick in the bathroom and then saw a little spot that began bleeding on her right ear. Pt assumed she was bitten by tick.  Pt reporting rash to right arm that began approx 3 weeks ago. Does state she has sensitivities to scents and lotions and has not changed any. Has tried cortisone and benadryl cream with no relief. Pt c/o recent headaches, nausea. Denies N/V.

## 2018-08-15 ENCOUNTER — Emergency Department: Payer: Self-pay

## 2018-08-15 ENCOUNTER — Other Ambulatory Visit: Payer: Self-pay

## 2018-08-15 ENCOUNTER — Encounter: Payer: Self-pay | Admitting: *Deleted

## 2018-08-15 ENCOUNTER — Emergency Department
Admission: EM | Admit: 2018-08-15 | Discharge: 2018-08-16 | Disposition: A | Discharge status: Home / Self Care | Payer: Self-pay | Attending: Emergency Medicine | Admitting: Emergency Medicine

## 2018-08-15 DIAGNOSIS — R1032 Left lower quadrant pain: Secondary | ICD-10-CM

## 2018-08-15 DIAGNOSIS — E871 Hypo-osmolality and hyponatremia: Secondary | ICD-10-CM

## 2018-08-15 DIAGNOSIS — Z79899 Other long term (current) drug therapy: Secondary | ICD-10-CM | POA: Insufficient documentation

## 2018-08-15 DIAGNOSIS — F17210 Nicotine dependence, cigarettes, uncomplicated: Secondary | ICD-10-CM | POA: Insufficient documentation

## 2018-08-15 DIAGNOSIS — R102 Pelvic and perineal pain: Secondary | ICD-10-CM | POA: Insufficient documentation

## 2018-08-15 LAB — URINE DRUG SCREEN, QUALITATIVE (ARMC ONLY)
Amphetamines, Ur Screen: NOT DETECTED
Barbiturates, Ur Screen: NOT DETECTED
Cannabinoid 50 Ng, Ur ~~LOC~~: POSITIVE — AB
Cocaine Metabolite,Ur ~~LOC~~: NOT DETECTED
MDMA (Ecstasy)Ur Screen: NOT DETECTED
Methadone Scn, Ur: NOT DETECTED
Opiate, Ur Screen: NOT DETECTED
Phencyclidine (PCP) Ur S: NOT DETECTED
Tricyclic, Ur Screen: NOT DETECTED

## 2018-08-15 LAB — COMPREHENSIVE METABOLIC PANEL
ALT: 44 U/L (ref 0–44)
AST: 80 U/L — ABNORMAL HIGH (ref 15–41)
Albumin: 4.1 g/dL (ref 3.5–5.0)
Alkaline Phosphatase: 68 U/L (ref 38–126)
Anion gap: 8 (ref 5–15)
BUN: <5 mg/dL — ABNORMAL LOW (ref 6–20)
CO2: 25 mmol/L (ref 22–32)
Calcium: 9.4 mg/dL (ref 8.9–10.3)
Chloride: 95 mmol/L — ABNORMAL LOW (ref 98–111)
Creatinine, Ser: 0.45 mg/dL (ref 0.44–1.00)
GFR calc Af Amer: >60 mL/min (ref >60)
GFR calc non Af Amer: >60 mL/min (ref >60)
Glucose, Bld: 138 mg/dL — ABNORMAL HIGH (ref 70–99)
Potassium: 4.4 mmol/L (ref 3.5–5.1)
Sodium: 128 mmol/L — ABNORMAL LOW (ref 135–145)
Total Bilirubin: 0.4 mg/dL (ref 0.3–1.2)
Total Protein: 7.6 g/dL (ref 6.5–8.1)

## 2018-08-15 LAB — URINALYSIS, COMPLETE (UACMP) WITH MICROSCOPIC
Bilirubin Urine: NEGATIVE
Glucose, UA: NEGATIVE mg/dL
Hgb urine dipstick: NEGATIVE
Ketones, ur: NEGATIVE mg/dL
Leukocytes, UA: NEGATIVE
Nitrite: NEGATIVE
Protein, ur: NEGATIVE mg/dL
Specific Gravity, Urine: 1.002 — ABNORMAL LOW (ref 1.005–1.030)
pH: 6 (ref 5.0–8.0)

## 2018-08-15 LAB — CBC
HCT: 40.3 % (ref 35.0–47.0)
Hemoglobin: 14.4 g/dL (ref 12.0–16.0)
MCH: 35.7 pg — ABNORMAL HIGH (ref 26.0–34.0)
MCHC: 35.7 g/dL (ref 32.0–36.0)
MCV: 99.9 fL (ref 80.0–100.0)
Platelets: 290 10*3/uL (ref 150–440)
RBC: 4.03 MIL/uL (ref 3.80–5.20)
RDW: 12.4 % (ref 11.5–14.5)
WBC: 4.6 10*3/uL (ref 3.6–11.0)

## 2018-08-15 LAB — PREGNANCY, URINE: Preg Test, Ur: NEGATIVE

## 2018-08-15 LAB — LIPASE, BLOOD: Lipase: 21 U/L (ref 11–51)

## 2018-08-15 MED ORDER — IOPAMIDOL (ISOVUE-300) INJECTION 61%
30.0000 mL | Freq: Once | INTRAVENOUS | Status: AC | PRN
  Administered 2018-08-15: 30 mL via ORAL

## 2018-08-15 MED ORDER — IOPAMIDOL (ISOVUE-300) INJECTION 61%
100.0000 mL | Freq: Once | INTRAVENOUS | Status: AC | PRN
  Administered 2018-08-15: 100 mL via INTRAVENOUS
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
## 2018-08-15 NOTE — ED Notes (Signed)
Pt back from ct

## 2018-08-15 NOTE — ED Notes (Signed)
Pt asked to give a urine specimen.
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
## 2018-08-15 NOTE — ED Notes (Signed)
Patient is resting comfortably. 

## 2018-08-16 LAB — WET PREP, GENITAL
Clue Cells Wet Prep HPF POC: NONE SEEN
Sperm: NONE SEEN
Trich, Wet Prep: NONE SEEN
Yeast Wet Prep HPF POC: NONE SEEN

## 2018-08-16 LAB — CHLAMYDIA/NGC RT PCR (ARMC ONLY)
Chlamydia Tr: NOT DETECTED
N gonorrhoeae: NOT DETECTED

## 2018-08-16 MED ORDER — SODIUM CHLORIDE 0.9 % IV SOLN
1.0000 g | Freq: Once | INTRAVENOUS | Status: AC
  Administered 2018-08-16: 1 g via INTRAVENOUS
  Filled 2018-08-16: qty 10

## 2018-08-16 MED ORDER — DOXYCYCLINE HYCLATE 100 MG PO TABS
100.0000 mg | ORAL_TABLET | Freq: Once | ORAL | Status: AC
  Administered 2018-08-16: 100 mg via ORAL
  Filled 2018-08-16: qty 1

## 2018-08-16 MED ORDER — SODIUM CHLORIDE 0.9 % IV BOLUS
2000.0000 mL | Freq: Once | INTRAVENOUS | Status: AC
  Administered 2018-08-16: 2000 mL via INTRAVENOUS

## 2018-08-16 MED ORDER — DOXYCYCLINE HYCLATE 100 MG PO CAPS: 100.0000 mg | ORAL_CAPSULE | Freq: Two times a day (BID) | ORAL | 0 refills | Status: DC

## 2018-08-16 NOTE — ED Notes (Signed)
First bladder scan showed 408 ml. Pt reports pain over the bladder. Centered over the LLQ. MD aware. Pt encouraged to urinate. Second bladder scan done. Pt bladder is now empty and pain is decreased.

## 2018-08-16 NOTE — Progress Notes (Addendum)
Pt seen and examined at bedside. [name] is a 42y/o woman w/ minimal PMHx. She is AAOx3, and is resting comfortably at the time of my Hx/ROS. She endorses a surgical Hx that includes four cervical surgeries. She states that she has had LLQ AP x2wks. The discomfort has been mild to moderate in severity, persistent. Discomfort not related to PO intake or activity/position change. Endorses a 2wk Hx of constipation, but states she had a large BM yesterday. Discomfort did not improve after BM. Denies blood in stool or urine. (-) menopause, denies abnormal/off-cycle bleeding. Noted to have urinary retention in ED. Possibly linked to constipation. Abdominal discomfort improved somewhat after voiding. Denies F/C/N/V/D, CP/SOB, palpitations, diaphoresis, night sweats, rigors, LH, LOC, urinary symptoms. VSS, exam (+) mild LLQ TTP, otherwise normal. Labwork demonstrates hypochloremic hyponatremia, mild hyperglycemia and transaminasemia (AST 80, persistent x3+yrs), otherwise unimpressive. U/A and wet prep (-). Pelvic U/S + CT A/P performed in ED are relatively unimpressive. CT mentions increased colonic stool burden, mild constipation, but does not note any other solid organ pathology. Pt appears comfortable, does not appear septic/toxic, and is not in acute distress. Denies any other symptoms on ROS.  Pt states she feels well, and wishes to go home. She states she is already scheduled to follow up with a walk-in clinic on Tuesday 08/21/2018.  -Hyponatremia: Na+ 128, hypochloremic. Endorses dehydration. 2L bolus NS ordered by ED provider. -Abdominal/pelvic discomfort: Return to ED if persistent/worsening or new symptoms (discussed specifics w/ pt). Is comfortable. Instructed pt to obtain referral to Ob/Gyn and set up to see them as soon as possible, especially given past Hx of multiple cervical conizations. -D/C home s/p IVF. Case d/w ED provider (Dr. Cinda Quest).  -P. Laderius Valbuena (Nocturnist/Hospitalist)

## 2018-08-16 NOTE — ED Notes (Signed)
Pt up to the toilet to urinate.

## 2018-08-16 NOTE — Discharge Instructions (Signed)
I wish she would stay in the hospital.  It would be safer.  Since she will not stay I will give you some antibiotics.  Doxycycline 1 twice a day.  Please follow-up with your doctor in the next few days.  Have them recheck your sodium.  Return here for worse pain, fever, vomiting or feeling sicker, unsteady or any other problems.  Eat a little bit of extra salt for the next few days.

## 2018-08-21 ENCOUNTER — Ambulatory Visit: Payer: Self-pay | Admitting: Urology

## 2018-08-21 VITALS — BP 106/65 | HR 67 | Temp 98.1°F | Wt 137.9 lb

## 2018-08-21 DIAGNOSIS — B029 Zoster without complications: Secondary | ICD-10-CM

## 2018-08-21 DIAGNOSIS — Z8541 Personal history of malignant neoplasm of cervix uteri: Secondary | ICD-10-CM
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
## 2018-08-22 ENCOUNTER — Telehealth: Payer: Self-pay | Admitting: Obstetrics and Gynecology

## 2018-08-22 LAB — HIV ANTIBODY (ROUTINE TESTING W REFLEX): HIV Screen 4th Generation wRfx: NONREACTIVE

## 2018-08-22 NOTE — Telephone Encounter (Signed)
[facility] referring for History of cervical cancer. Called and left voicemail for patient to call back to be  schedule

## 2018-08-24 ENCOUNTER — Telehealth: Payer: Self-pay

## 2018-08-24 NOTE — Telephone Encounter (Signed)
Called patient to inform her her labs were normal had to LVM to return call.

## 2018-08-24 NOTE — Telephone Encounter (Signed)
Patient being referred to [facility] program for financial assistance. Spoke with Alyse Low this morning. Patient aware Alyse Low will be calling to set up appointment. lvm patient to call back to confirm.

## 2018-08-24 NOTE — Telephone Encounter (Signed)
-----   Message from Nori Riis, PA-C sent at 08/23/2018  4:14 PM EDT ----- Please let Mrs. Pirani know that her HIV test was negative.

## 2018-08-30 ENCOUNTER — Ambulatory Visit: Payer: Self-pay

## 2018-09-18 ENCOUNTER — Ambulatory Visit: Payer: Self-pay

## 2018-10-15 ENCOUNTER — Ambulatory Visit: Payer: Self-pay

## 2018-12-17 ENCOUNTER — Ambulatory Visit: Payer: Self-pay

## 2019-01-29 IMAGING — US US TRANSVAGINAL NON-OB
1 series · 14 of 25 positions shown · non-contrast
Comparison: 08/15/2018

CLINICAL DATA: Left lower quadrant pain

EXAM:
TRANSABDOMINAL AND TRANSVAGINAL ULTRASOUND OF PELVIS
TECHNIQUE: Both transabdominal and transvaginal ultrasound examinations of the
pelvis were performed. Transabdominal technique was performed for
global imaging of the pelvis including uterus, ovaries, adnexal
regions, and pelvic cul-de-sac. It was necessary to proceed with
endovaginal exam following the transabdominal exam to visualize the
endometrium and ovaries.

[Series 1: us transvaginal non-ob · 0.27mm/px · 14 of 80 slices shown]
[im 1/80]
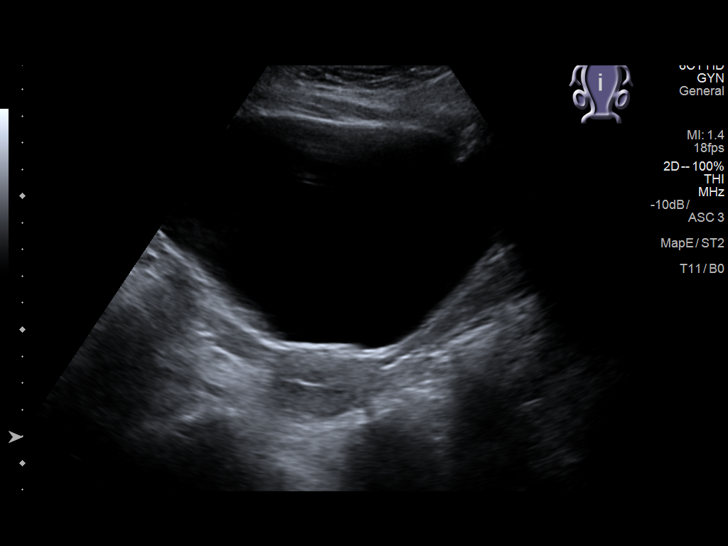
[im 7/80]
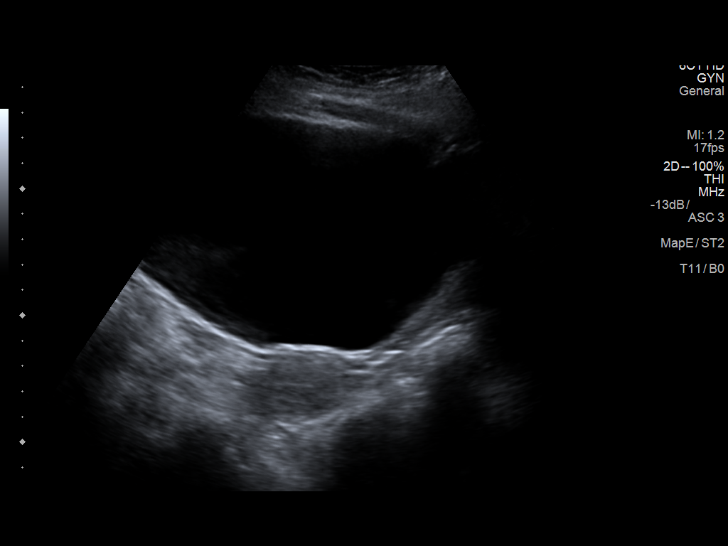
[im 14/80]
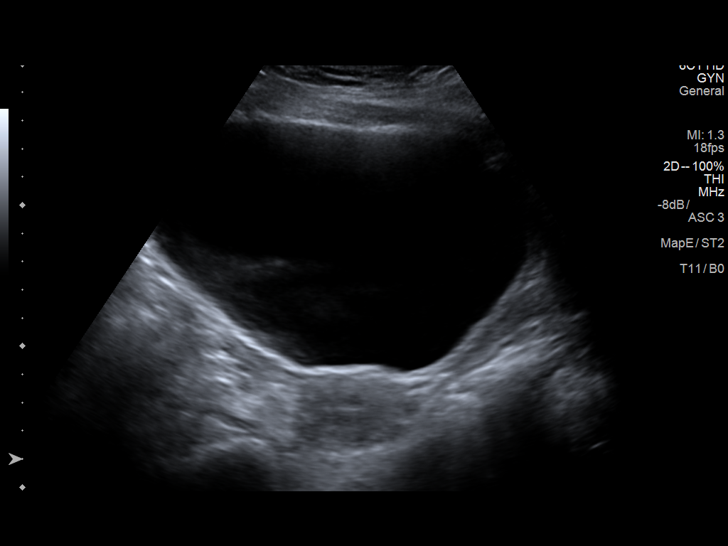
[im 20/80]
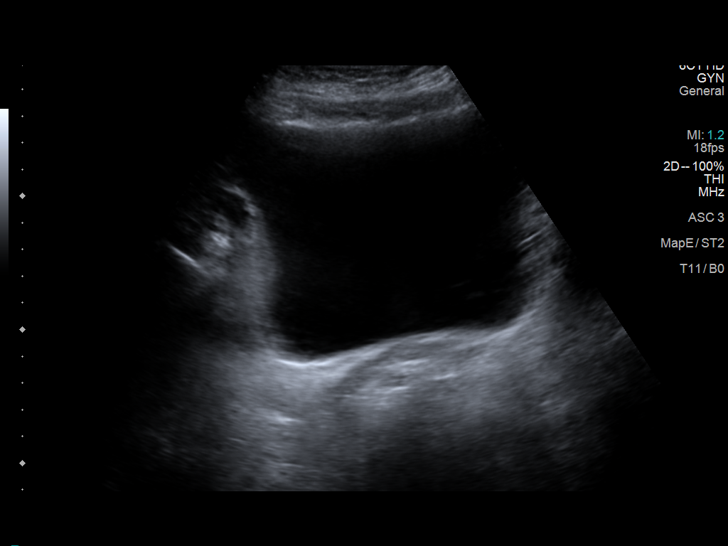
[im 27/80]
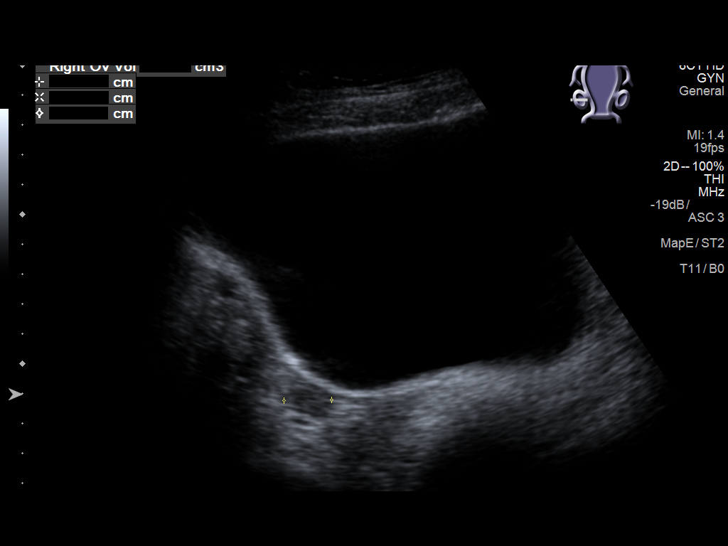
[im 30/80]
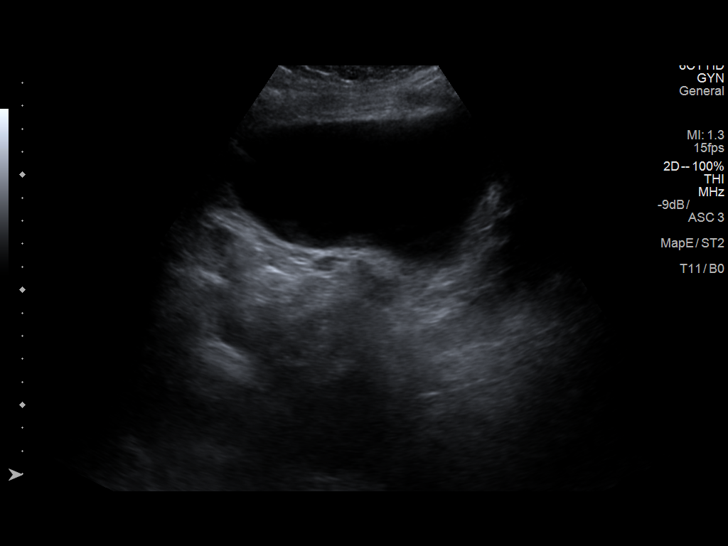
[im 37/80]
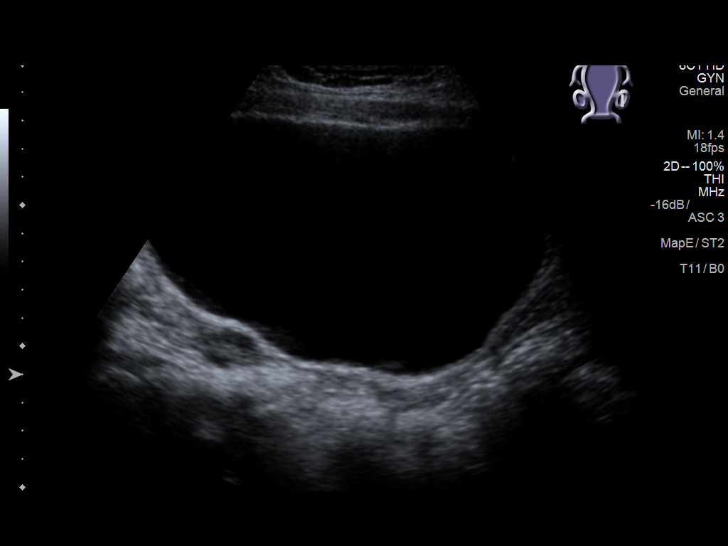
[im 43/80]
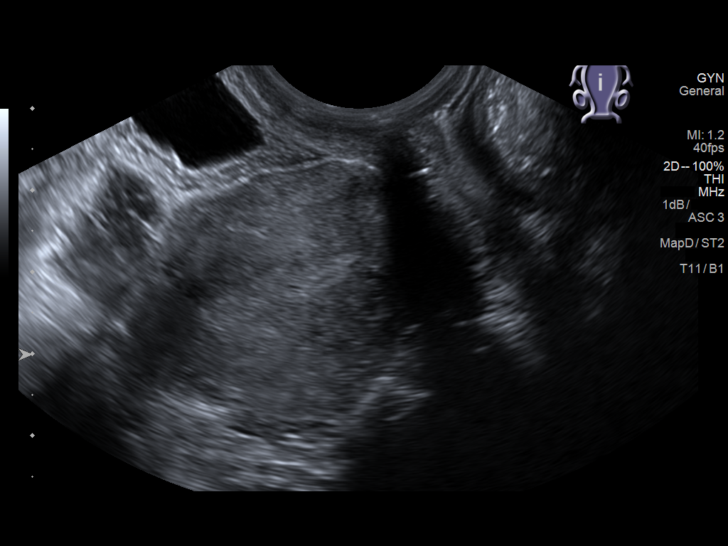
[im 50/80]
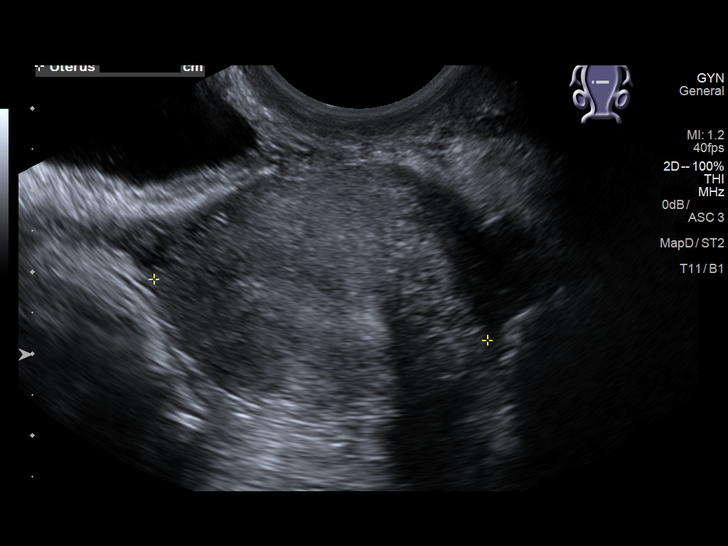
[im 53/80]
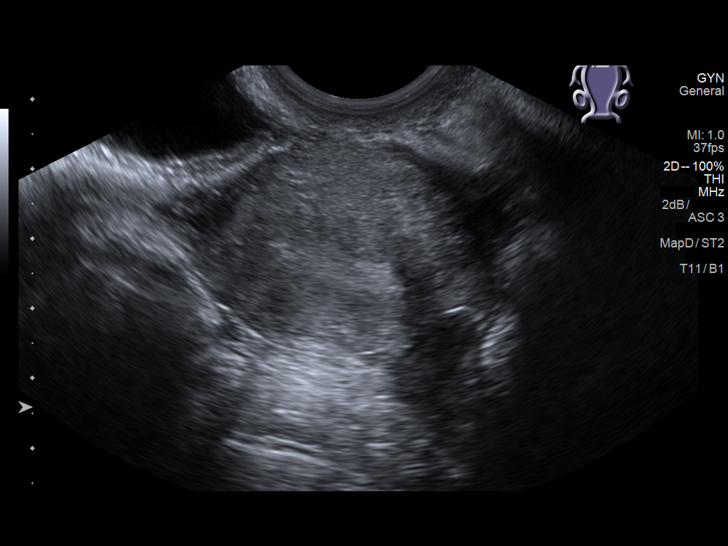
[im 60/80]
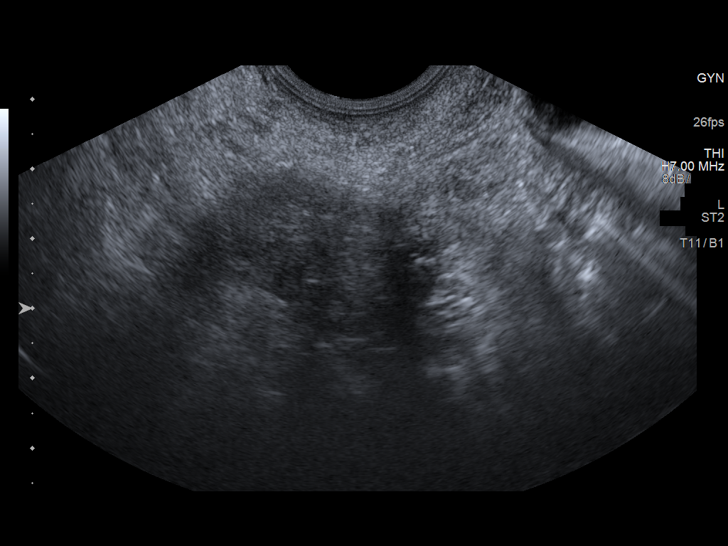
[im 66/80]
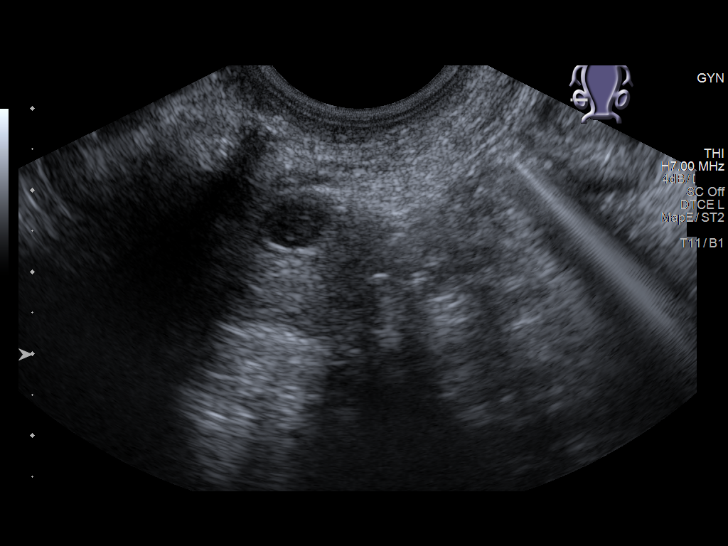
[im 73/80]
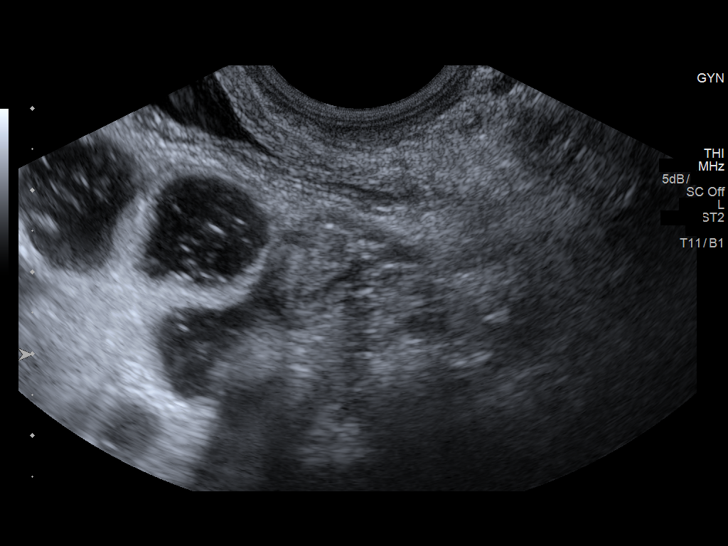
[im 80/80]
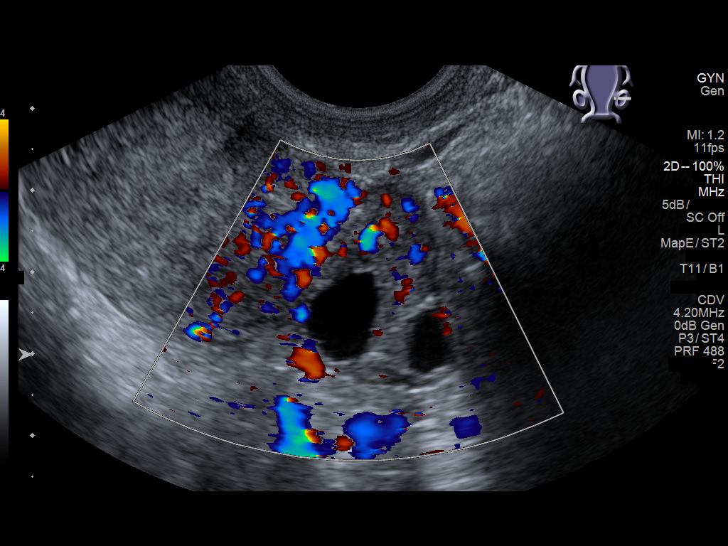

[14 of 25 positions shown; findings below may reference images not displayed]

FINDINGS: Uterus

Measurements: 4.6 x 3.1 x 4.1 cm. No fibroids or other mass
visualized.

Endometrium

Thickness: 4 mm in thickness.  No focal abnormality visualized.

Right ovary

Measurements: 2.7 x 1.4 x 2.5 cm. Normal appearance/no adnexal mass.

Left ovary

Measurements: 3.0 x 2.0 x 2.1 cm. Normal appearance/no adnexal mass.

Other findings

No abnormal free fluid.
IMPRESSION: Normal study.

## 2019-03-04 ENCOUNTER — Ambulatory Visit: Payer: Self-pay | Attending: Oncology

## 2019-05-02 ENCOUNTER — Encounter: Payer: Self-pay | Admitting: Urology

## 2019-05-02 ENCOUNTER — Ambulatory Visit: Payer: Self-pay | Admitting: Urology

## 2019-05-02 ENCOUNTER — Other Ambulatory Visit: Payer: Self-pay

## 2019-05-02 VITALS — Wt 125.0 lb

## 2019-05-02 DIAGNOSIS — Z8541 Personal history of malignant neoplasm of cervix uteri: Secondary | ICD-10-CM

## 2019-05-02 DIAGNOSIS — L309 Dermatitis, unspecified: Secondary | ICD-10-CM

## 2019-05-02 MED ORDER — FLUTICASONE PROPIONATE 50 MCG/ACT NA SUSP: 2.0000 | Freq: Every day | NASAL | 2 refills | Status: DC

## 2019-05-02 MED ORDER — TRIAMCINOLONE ACETONIDE 0.025 % EX OINT: 1.0000 "application " | TOPICAL_OINTMENT | Freq: Two times a day (BID) | CUTANEOUS | 3 refills | Status: DC
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
## 2019-08-01 IMAGING — CT CT ABD-PELV W/ CM
2 of 5 series · 17 of 46 positions shown, 19 images · IV contrast (APPLIED)
Comparison: 07/09/2014

CLINICAL DATA: Left lower abdominal pain.

EXAM:
CT ABDOMEN AND PELVIS WITH CONTRAST
TECHNIQUE: Multidetector CT imaging of the abdomen and pelvis was performed
using the standard protocol following bolus administration of
intravenous contrast.
CONTRAST:  100mL 8ZHM25-2JJ IOPAMIDOL (8ZHM25-2JJ) INJECTION 61%

[Series 2: routine abd/pel with · axial · 0.71mm/px · z∈[-440,-80]mm · 14 of 82 slices shown, 16 images]
[im 5/82  soft-tissue]
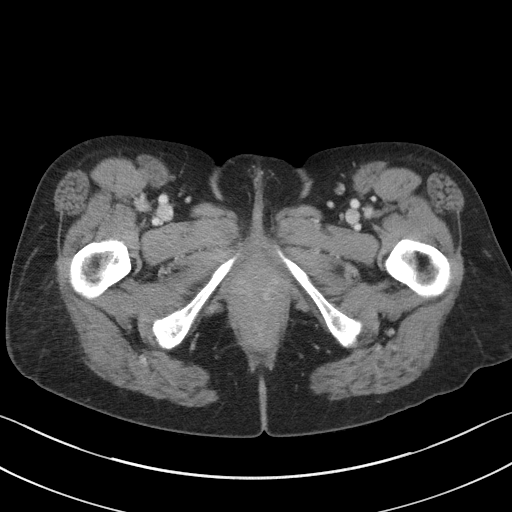
[im 5/82  bone]
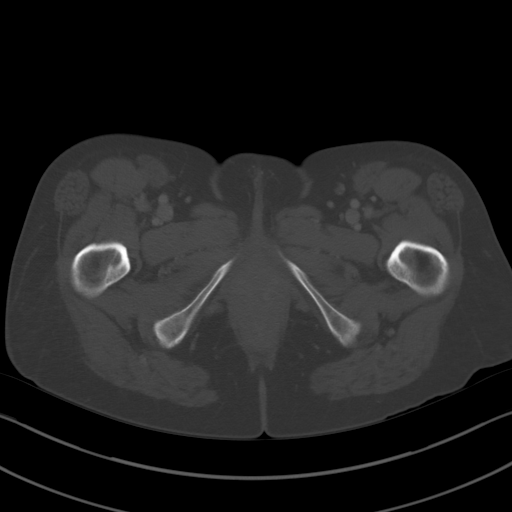
[im 10/82  soft-tissue]
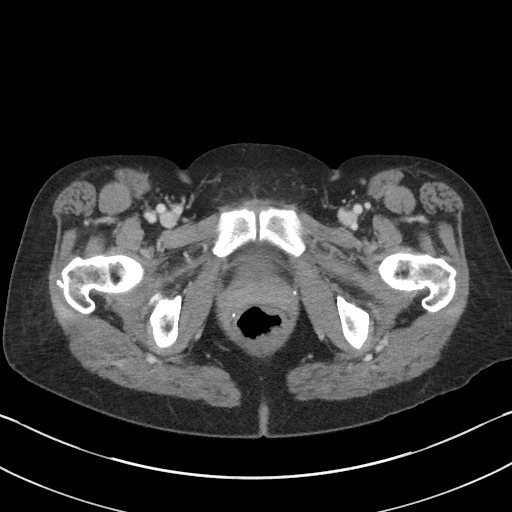
[im 19/82  soft-tissue]
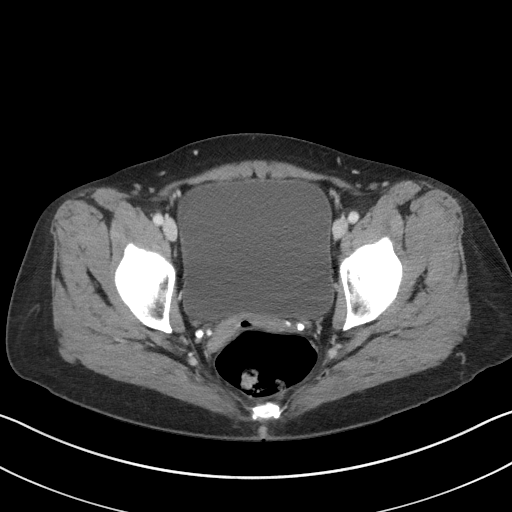
[im 23/82  soft-tissue]
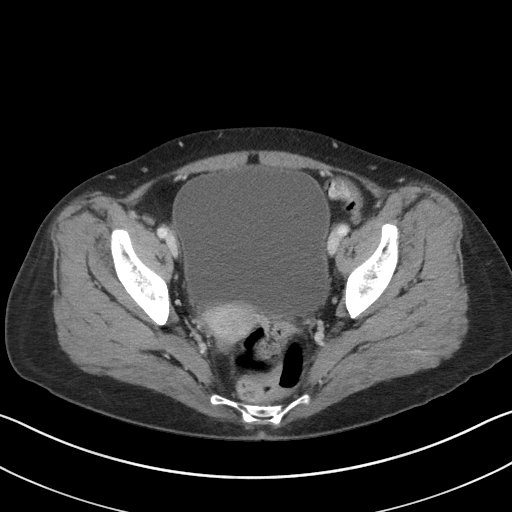
[im 28/82  soft-tissue]
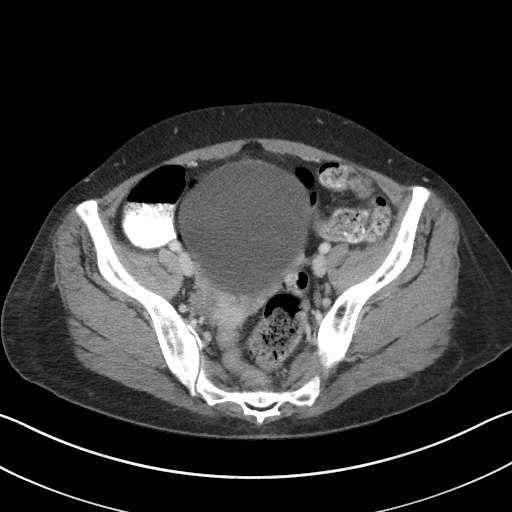
[im 32/82  soft-tissue]
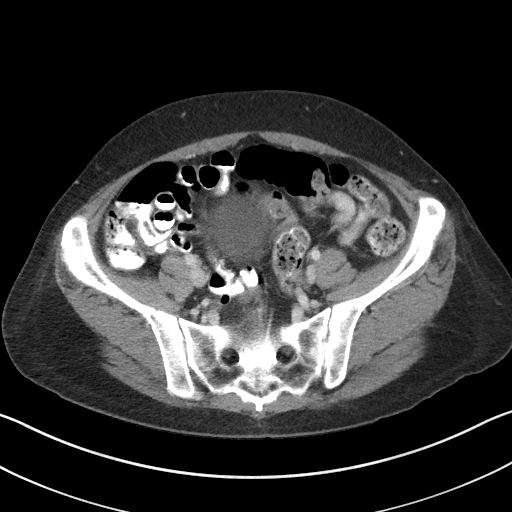
[im 37/82  soft-tissue]
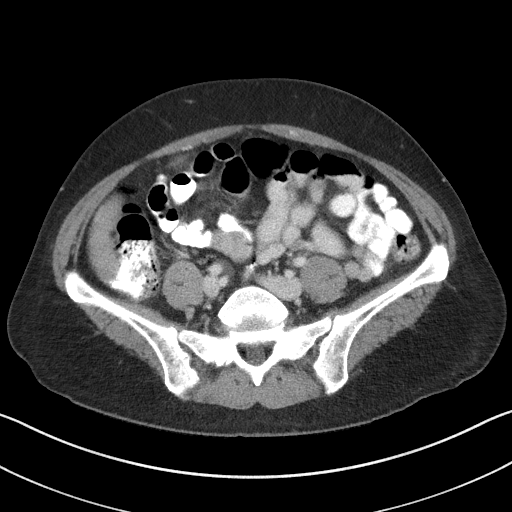
[im 46/82  soft-tissue]
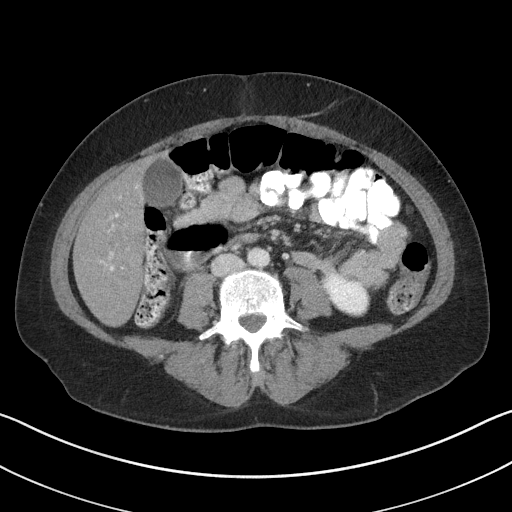
[im 50/82  soft-tissue]
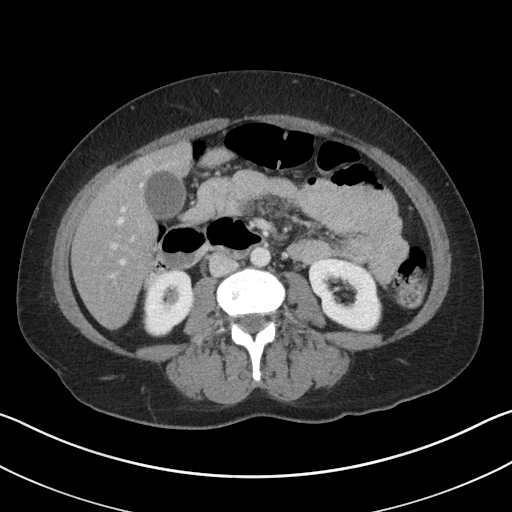
[im 50/82  bone]
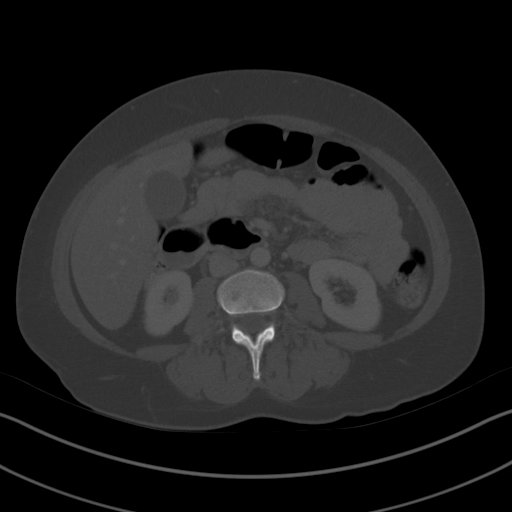
[im 55/82  soft-tissue]
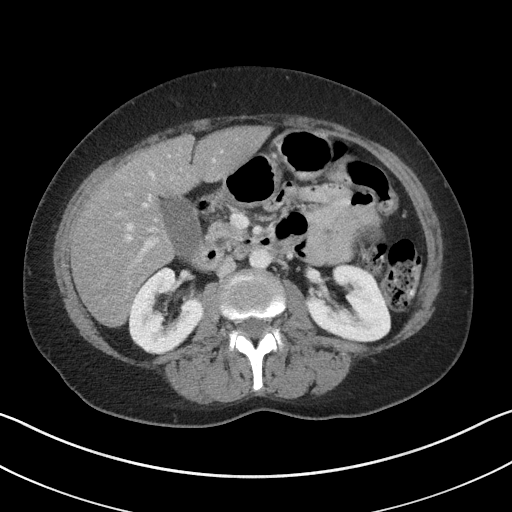
[im 59/82  soft-tissue]
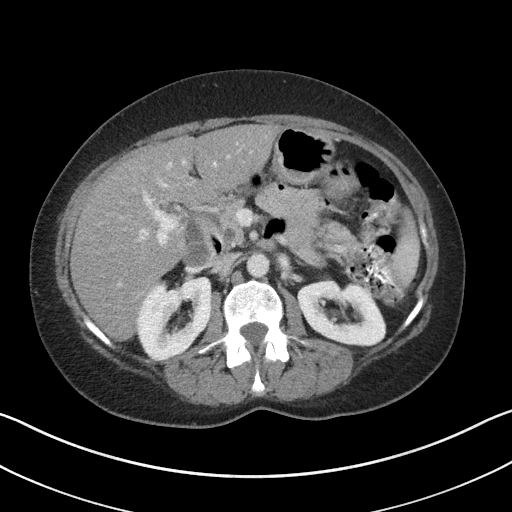
[im 64/82  soft-tissue]
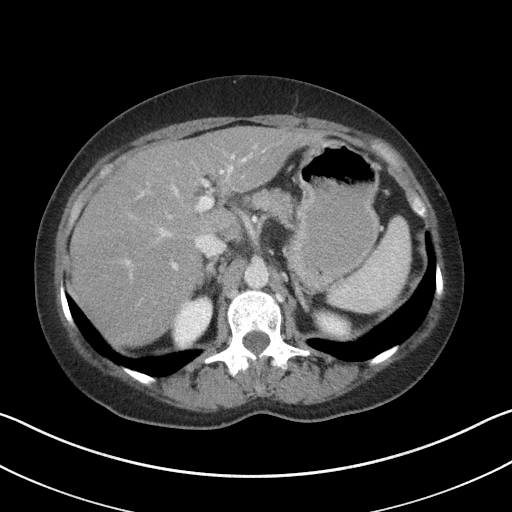
[im 73/82  soft-tissue]
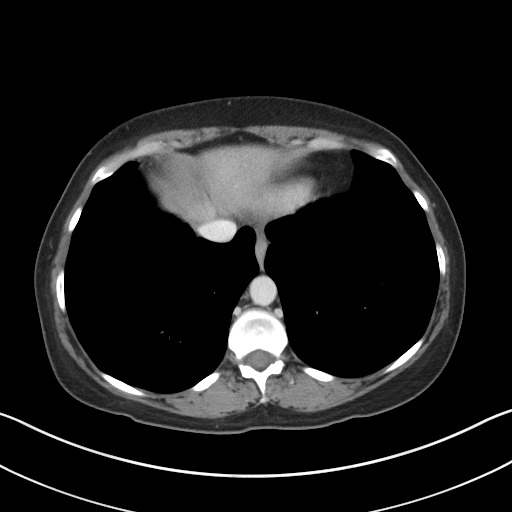
[im 77/82  soft-tissue]
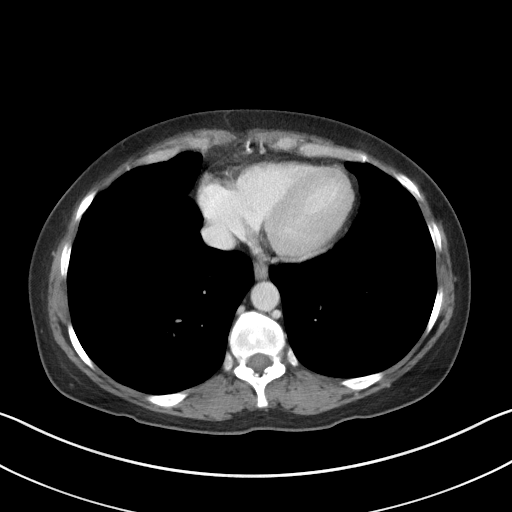

[Series 5: coronal st · coronal · 0.74mm/px · 3 of 86 slices shown]
[im 29/86  soft-tissue]
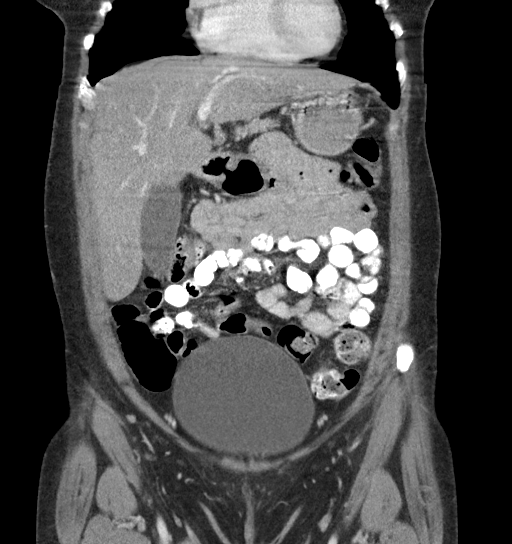
[im 38/86  soft-tissue]
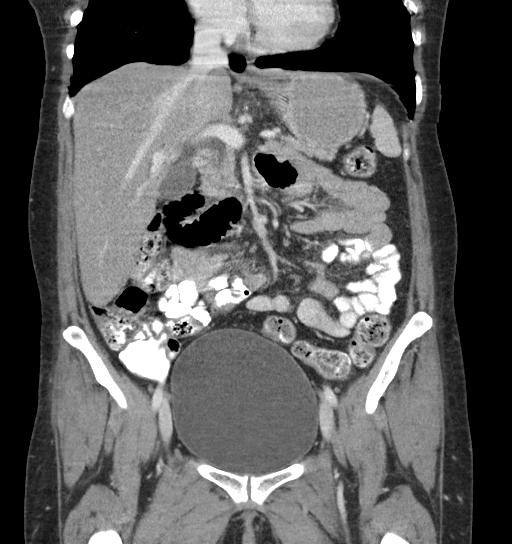
[im 48/86  soft-tissue]
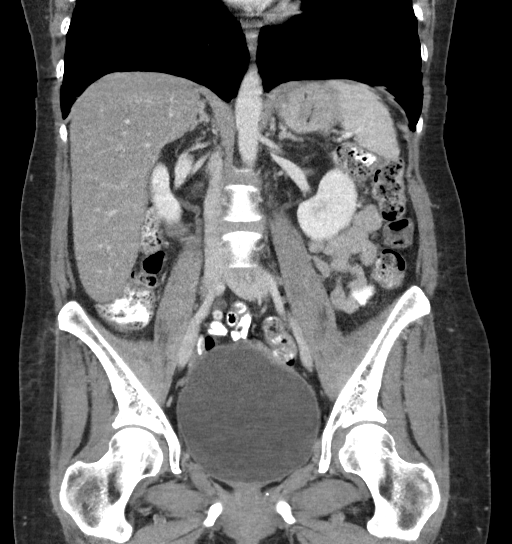

[17 of 46 positions shown; findings below may reference images not displayed]

FINDINGS: LOWER CHEST: Lung bases are clear. Included heart size is normal. No
pericardial effusion.

HEPATOBILIARY: Liver and gallbladder are normal.

PANCREAS: Normal.

SPLEEN: Normal.

ADRENALS/URINARY TRACT: Kidneys are orthotopic, demonstrating
symmetric enhancement. No nephrolithiasis, hydronephrosis or solid
renal masses. Repeat delayed imaging through the kidneys demonstrate
symmetric pyelograms without obstruction. Urinary bladder is
physiologically distended without focal mural thickening or
calculus. Normal adrenal glands.

STOMACH/BOWEL: The stomach, small and large bowel are normal in
course and caliber without inflammatory changes. Normal appendix.
Increased stool retention within the colon suspicious for mild
constipation.

VASCULAR/LYMPHATIC: Aortoiliac vessels are normal in course and
caliber. No lymphadenopathy by CT size criteria.

REPRODUCTIVE: The uterus and adnexa are unremarkable.

OTHER: No intraperitoneal free fluid or free air. Small fat
containing umbilical hernia.

MUSCULOSKELETAL: Nonacute. Previous omental disease not apparent on
current exam.
IMPRESSION: 1. Increased colonic stool burden suspicious for mild constipation.
No bowel obstruction or inflammation. No solid organ pathology.
2. Small fat containing umbilical hernia.

## 2019-08-29 ENCOUNTER — Ambulatory Visit: Payer: Self-pay | Admitting: Urology

## 2019-08-29 ENCOUNTER — Other Ambulatory Visit: Payer: Self-pay

## 2019-08-29 VITALS — BP 146/91 | HR 118 | Temp 98.1°F | Ht 60.0 in | Wt 118.3 lb

## 2019-08-29 DIAGNOSIS — L209 Atopic dermatitis, unspecified: Secondary | ICD-10-CM

## 2019-08-29 MED ORDER — TRIAMCINOLONE ACETONIDE 0.5 % EX OINT: 1.0000 "application " | TOPICAL_OINTMENT | Freq: Two times a day (BID) | CUTANEOUS | 0 refills | Status: DC

## 2019-08-29 MED ORDER — PROPRANOLOL HCL 40 MG PO TABS: 40.0000 mg | ORAL_TABLET | ORAL | 3 refills | Status: DC
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
## 2019-09-19 ENCOUNTER — Emergency Department
Admission: EM | Admit: 2019-09-19 | Discharge: 2019-09-20 | Disposition: A | Discharge status: Home / Self Care | Payer: Self-pay | Attending: Emergency Medicine | Admitting: Emergency Medicine

## 2019-09-19 ENCOUNTER — Other Ambulatory Visit: Payer: Self-pay

## 2019-09-19 DIAGNOSIS — Z20828 Contact with and (suspected) exposure to other viral communicable diseases: Secondary | ICD-10-CM | POA: Insufficient documentation

## 2019-09-19 DIAGNOSIS — R188 Other ascites: Secondary | ICD-10-CM | POA: Diagnosis present

## 2019-09-19 DIAGNOSIS — E440 Moderate protein-calorie malnutrition: Secondary | ICD-10-CM | POA: Diagnosis present

## 2019-09-19 DIAGNOSIS — R7989 Other specified abnormal findings of blood chemistry: Secondary | ICD-10-CM | POA: Diagnosis present

## 2019-09-19 DIAGNOSIS — F1011 Alcohol abuse, in remission: Secondary | ICD-10-CM | POA: Diagnosis present

## 2019-09-19 DIAGNOSIS — R945 Abnormal results of liver function studies: Secondary | ICD-10-CM | POA: Diagnosis present

## 2019-09-19 DIAGNOSIS — F10931 Alcohol use, unspecified with withdrawal delirium: Secondary | ICD-10-CM | POA: Diagnosis present

## 2019-09-19 DIAGNOSIS — Y908 Blood alcohol level of 240 mg/100 ml or more: Secondary | ICD-10-CM | POA: Insufficient documentation

## 2019-09-19 DIAGNOSIS — R002 Palpitations: Secondary | ICD-10-CM | POA: Diagnosis present

## 2019-09-19 DIAGNOSIS — Z046 Encounter for general psychiatric examination, requested by authority: Secondary | ICD-10-CM | POA: Insufficient documentation

## 2019-09-19 DIAGNOSIS — IMO0001 Reserved for inherently not codable concepts without codable children: Secondary | ICD-10-CM | POA: Diagnosis present

## 2019-09-19 DIAGNOSIS — D069 Carcinoma in situ of cervix, unspecified: Secondary | ICD-10-CM | POA: Diagnosis present

## 2019-09-19 DIAGNOSIS — R935 Abnormal findings on diagnostic imaging of other abdominal regions, including retroperitoneum: Secondary | ICD-10-CM | POA: Diagnosis present

## 2019-09-19 DIAGNOSIS — F1111 Opioid abuse, in remission: Secondary | ICD-10-CM | POA: Diagnosis present

## 2019-09-19 DIAGNOSIS — F10229 Alcohol dependence with intoxication, unspecified: Secondary | ICD-10-CM | POA: Insufficient documentation

## 2019-09-19 DIAGNOSIS — E871 Hypo-osmolality and hyponatremia: Secondary | ICD-10-CM | POA: Insufficient documentation

## 2019-09-19 DIAGNOSIS — F1010 Alcohol abuse, uncomplicated: Secondary | ICD-10-CM | POA: Diagnosis present

## 2019-09-19 DIAGNOSIS — F419 Anxiety disorder, unspecified: Secondary | ICD-10-CM | POA: Diagnosis present

## 2019-09-19 DIAGNOSIS — F10939 Alcohol use, unspecified with withdrawal, unspecified: Secondary | ICD-10-CM | POA: Diagnosis present

## 2019-09-19 DIAGNOSIS — Z8541 Personal history of malignant neoplasm of cervix uteri: Secondary | ICD-10-CM | POA: Diagnosis present

## 2019-09-19 DIAGNOSIS — F1120 Opioid dependence, uncomplicated: Secondary | ICD-10-CM | POA: Diagnosis present

## 2019-09-19 DIAGNOSIS — F17210 Nicotine dependence, cigarettes, uncomplicated: Secondary | ICD-10-CM | POA: Insufficient documentation

## 2019-09-19 DIAGNOSIS — F10239 Alcohol dependence with withdrawal, unspecified: Secondary | ICD-10-CM | POA: Diagnosis present

## 2019-09-19 DIAGNOSIS — F1920 Other psychoactive substance dependence, uncomplicated: Secondary | ICD-10-CM | POA: Diagnosis present

## 2019-09-19 DIAGNOSIS — F1020 Alcohol dependence, uncomplicated: Secondary | ICD-10-CM | POA: Diagnosis present

## 2019-09-19 DIAGNOSIS — Z79899 Other long term (current) drug therapy: Secondary | ICD-10-CM | POA: Insufficient documentation

## 2019-09-19 DIAGNOSIS — Z8542 Personal history of malignant neoplasm of other parts of uterus: Secondary | ICD-10-CM | POA: Insufficient documentation

## 2019-09-19 DIAGNOSIS — F10231 Alcohol dependence with withdrawal delirium: Secondary | ICD-10-CM | POA: Diagnosis present

## 2019-09-19 LAB — CBC
HCT: 32.2 % — ABNORMAL LOW (ref 36.0–46.0)
Hemoglobin: 11.4 g/dL — ABNORMAL LOW (ref 12.0–15.0)
MCH: 37.1 pg — ABNORMAL HIGH (ref 26.0–34.0)
MCHC: 35.4 g/dL (ref 30.0–36.0)
MCV: 104.9 fL — ABNORMAL HIGH (ref 80.0–100.0)
Platelets: 180 10*3/uL (ref 150–400)
RBC: 3.07 MIL/uL — ABNORMAL LOW (ref 3.87–5.11)
RDW: 13.2 % (ref 11.5–15.5)
WBC: 7.6 10*3/uL (ref 4.0–10.5)
nRBC: 0 % (ref 0.0–0.2)

## 2019-09-19 LAB — COMPREHENSIVE METABOLIC PANEL
ALT: 17 U/L (ref 0–44)
AST: 54 U/L — ABNORMAL HIGH (ref 15–41)
Albumin: 4.3 g/dL (ref 3.5–5.0)
Alkaline Phosphatase: 86 U/L (ref 38–126)
Anion gap: 12 (ref 5–15)
BUN: 5 mg/dL — ABNORMAL LOW (ref 6–20)
CO2: 25 mmol/L (ref 22–32)
Calcium: 8.8 mg/dL — ABNORMAL LOW (ref 8.9–10.3)
Chloride: 91 mmol/L — ABNORMAL LOW (ref 98–111)
Creatinine, Ser: 0.39 mg/dL — ABNORMAL LOW (ref 0.44–1.00)
GFR calc Af Amer: >60 mL/min (ref >60)
GFR calc non Af Amer: >60 mL/min (ref >60)
Glucose, Bld: 100 mg/dL — ABNORMAL HIGH (ref 70–99)
Potassium: 3.7 mmol/L (ref 3.5–5.1)
Sodium: 128 mmol/L — ABNORMAL LOW (ref 135–145)
Total Bilirubin: 0.7 mg/dL (ref 0.3–1.2)
Total Protein: 8.4 g/dL — ABNORMAL HIGH (ref 6.5–8.1)

## 2019-09-19 LAB — URINE DRUG SCREEN, QUALITATIVE (ARMC ONLY)
Amphetamines, Ur Screen: NOT DETECTED
Barbiturates, Ur Screen: NOT DETECTED
Benzodiazepine, Ur Scrn: NOT DETECTED
Cannabinoid 50 Ng, Ur ~~LOC~~: NOT DETECTED
Cocaine Metabolite,Ur ~~LOC~~: NOT DETECTED
MDMA (Ecstasy)Ur Screen: NOT DETECTED
Methadone Scn, Ur: NOT DETECTED
Opiate, Ur Screen: NOT DETECTED
Phencyclidine (PCP) Ur S: NOT DETECTED
Tricyclic, Ur Screen: NOT DETECTED

## 2019-09-19 LAB — POCT PREGNANCY, URINE: Preg Test, Ur: NEGATIVE

## 2019-09-19 LAB — SARS CORONAVIRUS 2 BY RT PCR (HOSPITAL ORDER, PERFORMED IN ~~LOC~~ HOSPITAL LAB): SARS Coronavirus 2: NEGATIVE

## 2019-09-19 LAB — ETHANOL: Alcohol, Ethyl (B): 269 mg/dL — ABNORMAL HIGH (ref <10)

## 2019-09-19 MED ORDER — LORAZEPAM 2 MG PO TABS
0.0000 mg | ORAL_TABLET | Freq: Four times a day (QID) | ORAL | Status: DC
  Administered 2019-09-19: 2 mg via ORAL
  Administered 2019-09-19: 1 mg via ORAL
  Administered 2019-09-20: 2 mg via ORAL
  Administered 2019-09-20: 1 mg via ORAL
  Filled 2019-09-19 (×5): qty 1

## 2019-09-19 MED ORDER — IBUPROFEN 600 MG PO TABS
600.0000 mg | ORAL_TABLET | Freq: Once | ORAL | Status: AC
  Administered 2019-09-19: 600 mg via ORAL
  Filled 2019-09-19: qty 1

## 2019-09-19 MED ORDER — THIAMINE HCL 100 MG/ML IJ SOLN: 100.0000 mg | Freq: Every day | INTRAMUSCULAR | Status: DC

## 2019-09-19 MED ORDER — LORAZEPAM 2 MG/ML IJ SOLN: 0.0000 mg | Freq: Two times a day (BID) | INTRAMUSCULAR | Status: DC

## 2019-09-19 MED ORDER — LORAZEPAM 2 MG/ML IJ SOLN: 0.0000 mg | Freq: Four times a day (QID) | INTRAMUSCULAR | Status: DC

## 2019-09-19 MED ORDER — VITAMIN B-1 100 MG PO TABS
100.0000 mg | ORAL_TABLET | Freq: Every day | ORAL | Status: DC
  Administered 2019-09-19 – 2019-09-20 (×2): 100 mg via ORAL
  Filled 2019-09-19 (×2): qty 1

## 2019-09-19 MED ORDER — DIPHENHYDRAMINE HCL 25 MG PO CAPS
25.0000 mg | ORAL_CAPSULE | Freq: Once | ORAL | Status: AC
  Administered 2019-09-19: 25 mg via ORAL
  Filled 2019-09-19: qty 1

## 2019-09-19 MED ORDER — LORAZEPAM 2 MG PO TABS: 0.0000 mg | ORAL_TABLET | Freq: Two times a day (BID) | ORAL | Status: DC

## 2019-09-19 MED ORDER — NICOTINE 21 MG/24HR TD PT24
21.0000 mg | MEDICATED_PATCH | Freq: Every day | TRANSDERMAL | Status: DC
  Administered 2019-09-19 – 2019-09-20 (×2): 21 mg via TRANSDERMAL
  Filled 2019-09-19 (×2): qty 1
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
## 2019-09-19 NOTE — ED Notes (Signed)
Patient said she needs benadryl for her allergies. EDP notified.

## 2019-09-19 NOTE — ED Notes (Signed)
Pt dressed out per this RN and RN, Sam.  Pt belongings placed in labeled bag and handed off to [employer].  Belongings include:  White Transport planner [employer] Blouse Black Jeans Bra Ugg Boots Phone Lighter Hair Tie
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
## 2019-09-19 NOTE — ED Notes (Signed)
Hourly rounding reveals patient in room. No complaints, stable, in no acute distress. Q15 minute rounds and monitoring via [place] and Officer to continue.   
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
## 2019-09-20 DIAGNOSIS — F1010 Alcohol abuse, uncomplicated: Secondary | ICD-10-CM | POA: Insufficient documentation

## 2019-09-20 NOTE — ED Notes (Signed)
Hourly rounding reveals patient in room. No complaints, stable, in no acute distress. Q15 minute rounds and monitoring via Security Cameras to continue. 

## 2019-09-20 NOTE — ED Notes (Signed)
BEHAVIORAL HEALTH ROUNDING Patient sleeping: No. Patient alert and oriented: yes Behavior appropriate: Yes.  ; If no, describe:  Nutrition and fluids offered: yes Toileting and hygiene offered: Yes  Sitter present: q15 minute observations and security monitoring Law enforcement present: Yes    

## 2019-09-20 NOTE — BH Assessment (Signed)
Pt cleared for discharge by Dr. Claris Gower.

## 2019-09-20 NOTE — ED Notes (Signed)
Hourly rounding reveals patient in room. No complaints, stable, in no acute distress. Q15 minute rounds and monitoring via [place] and Officer to continue.   

## 2019-09-20 NOTE — ED Provider Notes (Signed)
Patient seen and cleared for discharge by psychiatry services.  Discussed with psychiatrist.     Delman Kitten, MD 09/20/19 1345
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
## 2019-09-20 NOTE — ED Notes (Addendum)
Patient discharged home with friend, patient received discharge papers. Patient received belongings and verbalized she has received all of her belongings. Patient appropriate and cooperative, Denies SI/HI AVH. Vital signs taken. NAD noted.

## 2019-10-10 ENCOUNTER — Ambulatory Visit: Payer: Self-pay

## 2019-10-15 ENCOUNTER — Ambulatory Visit: Payer: Self-pay | Admitting: Gerontology

## 2019-10-29 ENCOUNTER — Telehealth: Payer: Self-pay | Admitting: Gerontology

## 2019-10-31 ENCOUNTER — Ambulatory Visit: Payer: Self-pay

## 2020-01-08 ENCOUNTER — Other Ambulatory Visit: Payer: Self-pay

## 2020-01-08 ENCOUNTER — Ambulatory Visit: Payer: Self-pay | Admitting: Gerontology

## 2020-01-08 ENCOUNTER — Other Ambulatory Visit: Payer: Self-pay | Admitting: Family Medicine

## 2020-01-08 DIAGNOSIS — Z0000 Encounter for general adult medical examination without abnormal findings: Secondary | ICD-10-CM | POA: Insufficient documentation

## 2020-01-10 ENCOUNTER — Other Ambulatory Visit: Payer: Self-pay | Admitting: Family Medicine

## 2020-01-16 ENCOUNTER — Other Ambulatory Visit: Payer: Self-pay

## 2020-01-16 ENCOUNTER — Ambulatory Visit: Payer: Self-pay | Admitting: Gerontology

## 2020-01-16 ENCOUNTER — Encounter: Payer: Self-pay | Admitting: Gerontology

## 2020-01-16 VITALS — BP 142/100 | HR 120 | Ht 60.0 in | Wt 131.0 lb

## 2020-01-16 DIAGNOSIS — R002 Palpitations: Secondary | ICD-10-CM

## 2020-01-16 DIAGNOSIS — F1010 Alcohol abuse, uncomplicated: Secondary | ICD-10-CM

## 2020-01-16 DIAGNOSIS — F17200 Nicotine dependence, unspecified, uncomplicated: Secondary | ICD-10-CM | POA: Insufficient documentation

## 2020-01-16 DIAGNOSIS — Z889 Allergy status to unspecified drugs, medicaments and biological substances status: Secondary | ICD-10-CM | POA: Insufficient documentation

## 2020-01-16 DIAGNOSIS — I10 Essential (primary) hypertension: Secondary | ICD-10-CM | POA: Insufficient documentation

## 2020-01-16 MED ORDER — FLUTICASONE PROPIONATE 50 MCG/ACT NA SUSP: 2.0000 | Freq: Every day | NASAL | 0 refills | Status: DC

## 2020-01-16 MED ORDER — PROPRANOLOL HCL 40 MG PO TABS: 40.0000 mg | ORAL_TABLET | ORAL | 2 refills | Status: DC

## 2020-01-16 MED ORDER — CETIRIZINE HCL 10 MG PO CHEW: 10.0000 mg | CHEWABLE_TABLET | Freq: Every day | ORAL | 2 refills | Status: DC

## 2020-01-16 NOTE — Patient Instructions (Signed)
Palpitations Palpitations are feelings that your heartbeat is not normal. Your heartbeat may feel like it is:  Uneven.  Faster than normal.  Fluttering.  Skipping a beat. This is usually not a serious problem. In some cases, you may need tests to rule out any serious problems. Follow these instructions at home: Pay attention to any changes in your condition. Take these actions to help manage your symptoms: Eating and drinking  Avoid: ? Coffee, tea, soft drinks, and energy drinks. ? Chocolate. ? Alcohol. ? Diet pills. Lifestyle   Try to lower your stress. These things can help you relax: ? Yoga. ? Deep breathing and meditation. ? Exercise. ? Using words and images to create positive thoughts (guided imagery). ? Using your mind to control things in your body (biofeedback).  Do not use drugs.  Get plenty of rest and sleep. Keep a regular bed time. General instructions   Take over-the-counter and prescription medicines only as told by your doctor.  Do not use any products that contain nicotine or tobacco, such as cigarettes and e-cigarettes. If you need help quitting, ask your doctor.  Keep all follow-up visits as told by your doctor. This is important. You may need more tests if palpitations do not go away or get worse. Contact a doctor if:  Your symptoms last more than 24 hours.  Your symptoms occur more often. Get help right away if you:  Have chest pain.  Feel short of breath.  Have a very bad headache.  Feel dizzy.  Pass out (faint). Summary  Palpitations are feelings that your heartbeat is uneven or faster than normal. It may feel like your heart is fluttering or skipping a beat.  Avoid food and drinks that may cause palpitations. These include caffeine, chocolate, and alcohol.  Try to lower your stress. Do not smoke or use drugs.  Get help right away if you faint or have chest pain, shortness of breath, a severe headache, or dizziness. This  information is not intended to replace advice given to you by your health care provider. Make sure you discuss any questions you have with your health care provider. Document Revised: 01/17/2018 Document Reviewed: 01/17/2018 Elsevier Patient Education  2020 [employer]. Smoking Tobacco Information, Adult Smoking tobacco can be harmful to your health. Tobacco contains a poisonous (toxic), colorless chemical called nicotine. Nicotine is addictive. It changes the brain and can make it hard to stop smoking. Tobacco also has other toxic chemicals that can hurt your body and raise your risk of many cancers. How can smoking tobacco affect me? Smoking tobacco puts you at risk for:  Cancer. Smoking is most commonly associated with lung cancer, but can also lead to cancer in other parts of the body.  Chronic obstructive pulmonary disease (COPD). This is a long-term lung condition that makes it hard to breathe. It also gets worse over time.  High blood pressure (hypertension), heart disease, stroke, or heart attack.  Lung infections, such as pneumonia.  Cataracts. This is when the lenses in the eyes become clouded.  Digestive problems. This may include peptic ulcers, heartburn, and gastroesophageal reflux disease (GERD).  Oral health problems, such as gum disease and tooth loss.  Loss of taste and smell. Smoking can affect your appearance by causing:  Wrinkles.  Yellow or stained teeth, fingers, and fingernails. Smoking tobacco can also affect your social life, because:  It may be challenging to find places to smoke when away from home. Many workplaces, [employer], hotels, and public places  are tobacco-free.  Smoking is expensive. This is due to the cost of tobacco and the long-term costs of treating health problems from smoking.  Secondhand smoke may affect those around you. Secondhand smoke can cause lung cancer, breathing problems, and heart disease. Children of smokers have a higher  risk for: ? Sudden infant death syndrome (SIDS). ? Ear infections. ? Lung infections. If you currently smoke tobacco, quitting now can help you:  Lead a longer and healthier life.  Look, smell, breathe, and feel better over time.  Save money.  Protect others from the harms of secondhand smoke. What actions can I take to prevent health problems? Quit smoking   Do not start smoking. Quit if you already do.  Make a plan to quit smoking and commit to it. Look for programs to help you and ask your health care provider for recommendations and ideas.  Set a date and write down all the reasons you want to quit.  Let your friends and family know you are quitting so they can help and support you. Consider finding friends who also want to quit. It can be easier to quit with someone else, so that you can support each other.  Talk with your health care provider about using nicotine replacement medicines to help you quit, such as gum, lozenges, patches, sprays, or pills.  Do not replace cigarette smoking with electronic cigarettes, which are commonly called e-cigarettes. The safety of e-cigarettes is not known, and some may contain harmful chemicals.  If you try to quit but return to smoking, stay positive. It is common to slip up when you first quit, so take it one day at a time.  Be prepared for cravings. When you feel the urge to smoke, chew gum or suck on hard candy. Lifestyle  Stay busy and take care of your body.  Drink enough fluid to keep your urine pale yellow.  Get plenty of exercise and eat a healthy diet. This can help prevent weight gain after quitting.  Monitor your eating habits. Quitting smoking can cause you to have a larger appetite than when you smoke.  Find ways to relax. Go out with friends or family to a movie or a restaurant where people do not smoke.  Ask your health care provider about having regular tests (screenings) to check for cancer. This may include blood  tests, imaging tests, and other tests.  Find ways to manage your stress, such as meditation, yoga, or exercise. Where to find support To get support to quit smoking, consider:  Asking your health care provider for more information and resources.  Taking classes to learn more about quitting smoking.  Looking for local organizations that offer resources about quitting smoking.  Joining a support group for people who want to quit smoking in your local community.  Calling the smokefree.gov counselor helpline: 1-800-Quit-Now (626)220-9416) Where to find more information You may find more information about quitting smoking from:  HelpGuide.org: www.helpguide.org  https://hall.com/: smokefree.gov  American Lung Association: www.lung.org Contact a health care provider if you:  Have problems breathing.  Notice that your lips, nose, or fingers turn blue.  Have chest pain.  Are coughing up blood.  Feel faint or you pass out.  Have other health changes that cause you to worry. Summary  Smoking tobacco can negatively affect your health, the health of those around you, your finances, and your social life.  Do not start smoking. Quit if you already do. If you need help quitting, ask your  health care provider.  Think about joining a support group for people who want to quit smoking in your local community. There are many effective programs that will help you to quit this behavior. This information is not intended to replace advice given to you by your health care provider. Make sure you discuss any questions you have with your health care provider. Document Revised: 08/30/2019 Document Reviewed: 12/20/2016 Elsevier Patient Education  2020 [employer].

## 2020-01-16 NOTE — Progress Notes (Signed)
Established Patient Office Visit  Subjective:  Patient ID: [name], female    DOB: 12-01-1976  Age: 44 y.o. MRN: [id number]  CC: No chief complaint on file.   HPI [name] presents for medication refill. She has missed 6-7 follow up appointments. She states that she's out of her Propranolol 40 mg daily for one week. She reports that her heart rate has increased since she was out of her medication, but denies dizziness and chest pain. She states that she experiences mild intermittent shortness of breath when she is smoking. She smokes 10 cigarettes daily and admits the desire to quit. She also reports that she is allergic to environmental allergens and takes 25 mg Diphenhydramine twice daily. She states that she has cut down on her alcoholic beverage  Consumption to 4-6 beers twice weekly, but she requests a mental health evaluation. Overall, she states that she's doing well and offers no further complaint.  Past Medical History:  Diagnosis Date  . Abnormal Pap smear    Age 29  . 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 Alcohol abuse   . Anxiety   . Bacterial infection   . Cervical intraepithelial neoplasia III   . 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 Condyloma 2011  . H/O fatigue 2009  . H/O varicella   . Headache(784.0)   . HPV (human papilloma virus) anogenital infection   . Hx of dizziness 09/20/2011  . Hx: UTI (urinary tract infection)    Back pain  . Irregular menstrual cycle   . IV drug user   . Palpitations 2010  . Pelvic pain in female 12/05/11  . 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 Trichomonas   . Yeast infection     Past Surgical History:  Procedure Laterality Date  . A*wisdom teeth ext    . CERVICAL CONIZATION W/BX  11/30/2011   Procedure: CONIZATION CERVIX WITH BIOPSY;  Surgeon: [name], MD;  Location: [facility];  Service: Gynecology;  Laterality: N/A;  . CERVICAL  CONIZATION W/BX  03/16/2012   Procedure: CONIZATION CERVIX WITH BIOPSY;  Surgeon: Ena Dawley, MD;  Location: [facility];  Service: Gynecology;  Laterality: N/A;  . CONIZATION CERVIX     x 3  . DILATION AND CURETTAGE OF UTERUS  11/30/2011   Procedure: DILATATION AND CURETTAGE;  Surgeon: [name], MD;  Location: [facility];  Service: Gynecology;  Laterality: N/A;  . svd     x 1    Family History  Problem Relation Age of Onset  . Cervical cancer Mother   . Breast cancer Mother   . Cancer Mother 33       Breast & cervical   . Stroke Maternal Grandmother   . COPD Maternal Grandfather        Emphysema  . Hypertension Paternal Grandfather   . Stroke Paternal Grandfather   . Cancer Father 14       prostate    Social History   Socioeconomic History  . Marital status: Divorced    Spouse name: Not on file  . Number of children: 1  . Years of education: Not on file  . Highest education level: Master's degree (e.g., MA, MS, MEng, MEd, MSW, MBA)  Occupational History  . Occupation: unemployed  Tobacco Use  . Smoking status: Current Every Day Smoker    Years: 30.00    Types: Cigarettes  . Smokeless  tobacco: Current User  . Tobacco comment: 10 a day  Substance and Sexual Activity  . Alcohol use: Yes    Alcohol/week: 43.0 - 44.0 standard drinks    Types: 42 Cans of beer, 1 - 2 Glasses of wine per week    Comment: every day after work   . 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 Sexual activity: Not Currently    Birth control/protection: Abstinence  Other Topics Concern  . Not on file  Social History Narrative   Work or School: clinical addiction specialist      Home Situation: lives with daughter and wife      Spiritual Beliefs: none      Lifestyle: no regular exercise; good diet          On Food stamps   Social Determinants of Health   Financial Resource Strain: Medium Risk  . Difficulty of Paying Living Expenses:  Somewhat hard  Food Insecurity:   . Worried About Charity fundraiser in the Last Year: Not on file  . Ran Out of Food in the Last Year: Not on file  Transportation Needs:   . Lack of Transportation (Medical): Not on file  . Lack of Transportation (Non-Medical): Not on file  Physical Activity: Unknown  . Days of Exercise per Week: 7 days  . Minutes of Exercise per Session: Not on file  Stress:   . Feeling of Stress : Not on file  Social Connections:   . Frequency of Communication with Friends and Family: Not on file  . Frequency of Social Gatherings with Friends and Family: Not on file  . Attends Religious Services: Not on file  . Active Member of Clubs or Organizations: Not on file  . Attends Archivist Meetings: Not on file  . Marital Status: Not on file  Intimate Partner Violence:   . Fear of Current or Ex-Partner: Not on file  . Emotionally Abused: Not on file  . Physically Abused: Not on file  . Sexually Abused: Not on file    Outpatient Medications Prior to Visit  Medication Sig Dispense Refill  . Multiple Vitamin (MULTIVITAMIN WITH MINERALS) TABS tablet Take 1 tablet by mouth daily. 30 tablet 0  . triamcinolone ointment (KENALOG) 0.5 % Apply 1 application topically 2 (two) times daily. 30 g 0  . propranolol (INDERAL) 40 MG tablet Take 1 tablet (40 mg total) by mouth every morning. 30 tablet 3  . amphetamine-dextroamphetamine (ADDERALL) 20 MG tablet Take 20 mg by mouth 3 (three) times daily.  0  . fluticasone (FLONASE) 50 MCG/ACT nasal spray Place 2 sprays into both nostrils daily. (Patient not taking: Reported on 01/16/2020) 9.9 g 2   No facility-administered medications prior to visit.    No Known Allergies  ROS Review of Systems  HENT: Positive for sinus pressure.   Respiratory: Positive for shortness of breath.   Cardiovascular: Positive for palpitations.  Skin: Negative.   Neurological: Negative.   Psychiatric/Behavioral: Negative.        Objective:    Physical Exam  Constitutional: She is oriented to person, place, and time. She appears well-developed.  HENT:  Head: Normocephalic and atraumatic.  Eyes: Pupils are equal, round, and reactive to light. EOM are normal.  Cardiovascular: Normal pulses. Tachycardia present.  Pulmonary/Chest: Effort normal and breath sounds normal.  Neurological: She is alert and oriented to person, place, and time.  Skin: Skin  is warm and dry.  Psychiatric: She has a normal mood and affect. Her behavior is normal. Judgment and thought content normal.    BP (!) 142/100 (BP Location: Left Arm, Patient Position: Sitting)   Pulse (!) 120   Ht 5' (1.524 m)   Wt 131 lb (59.4 kg)   SpO2 97%   BMI 25.58 kg/m  Wt Readings from Last 3 Encounters:  01/16/20 131 lb (59.4 kg)  09/19/19 125 lb (56.7 kg)  08/29/19 118 lb 4.8 oz (53.7 kg)     Health Maintenance Due  Topic Date Due  . TETANUS/TDAP  07/07/1993  . INFLUENZA VACCINE  07/20/2019    There are no preventive care reminders to display for this patient.  Lab Results  Component Value Date   TSH 1.25 02/20/2014   Lab Results  Component Value Date   WBC 7.6 09/19/2019   HGB 11.4 (L) 09/19/2019   HCT 32.2 (L) 09/19/2019   MCV 104.9 (H) 09/19/2019   PLT 180 09/19/2019   Lab Results  Component Value Date   NA 128 (L) 09/19/2019   K 3.7 09/19/2019   CHLORIDE 105 07/21/2014   CO2 25 09/19/2019   GLUCOSE 100 (H) 09/19/2019   BUN 5 (L) 09/19/2019   CREATININE 0.39 (L) 09/19/2019   BILITOT 0.7 09/19/2019   ALKPHOS 86 09/19/2019   AST 54 (H) 09/19/2019   ALT 17 09/19/2019   PROT 8.4 (H) 09/19/2019   ALBUMIN 4.3 09/19/2019   CALCIUM 8.8 (L) 09/19/2019   ANIONGAP 12 09/19/2019   No results found for: CHOL No results found for: HDL No results found for: LDLCALC No results found for: TRIG No results found for: CHOLHDL No results found for: HGBA1C    Assessment & Plan:    1. Palpitations - evaluated by Dr. Cathie Olden in the  past - She was advised to continue on Propranolol and notify clinic for medication refill early. She was also advised to check her heart rate and notify clinic or go to the ED for worsening symptoms. - propranolol (INDERAL) 40 MG tablet; Take 1 tablet (40 mg total) by mouth every morning.  Dispense: 30 tablet; Refill: 2  2. History of allergy - She was advised not to take Diphenhydramine but start taking Zyrtec. She was advised to notify clinic for worsening symptoms. - fluticasone (FLONASE) 50 MCG/ACT nasal spray; Place 2 sprays into both nostrils daily.  Dispense: 9.9 g; Refill: 0 - cetirizine (ZYRTEC) 10 MG chewable tablet; Chew 1 tablet (10 mg total) by mouth daily.  Dispense: 30 tablet; Refill: 2  3. Essential hypertension - She was advised to take medication as prescribed, and continue on DASH diet. - propranolol (INDERAL) 40 MG tablet; Take 1 tablet (40 mg total) by mouth every morning.  Dispense: 30 tablet; Refill: 2  4. ETOH abuse - She was advised to abstain from alcohol , and was provided with [facility] information for substance abuse counseling.  5. Smoking - She was encouraged on smoking cessation and was provided with [place] Quit line information.    Follow-up: Return in about 4 weeks (around 02/13/2020), or if symptoms worsen or fail to improve.    Zaeem Kandel Jerold Coombe, NP

## 2020-02-05 ENCOUNTER — Other Ambulatory Visit: Payer: Self-pay

## 2020-02-05 DIAGNOSIS — Z0000 Encounter for general adult medical examination without abnormal findings: Secondary | ICD-10-CM

## 2020-02-06 LAB — URINALYSIS
Bilirubin, UA: NEGATIVE
Glucose, UA: NEGATIVE
Ketones, UA: NEGATIVE
Leukocytes,UA: NEGATIVE
Nitrite, UA: NEGATIVE
Protein,UA: NEGATIVE
RBC, UA: NEGATIVE
Specific Gravity, UA: 1.006 (ref 1.005–1.030)
Urobilinogen, Ur: 0.2 mg/dL (ref 0.2–1.0)
pH, UA: 7.5 (ref 5.0–7.5)

## 2020-02-07 LAB — COMPREHENSIVE METABOLIC PANEL
ALT: 35 IU/L — ABNORMAL HIGH (ref 0–32)
AST: 78 IU/L — ABNORMAL HIGH (ref 0–40)
Albumin/Globulin Ratio: 1.4 (ref 1.2–2.2)
Albumin: 4.6 g/dL (ref 3.8–4.8)
Alkaline Phosphatase: 96 IU/L (ref 39–117)
BUN/Creatinine Ratio: 12 (ref 9–23)
BUN: 7 mg/dL (ref 6–24)
Bilirubin Total: 0.5 mg/dL (ref 0.0–1.2)
CO2: 22 mmol/L (ref 20–29)
Calcium: 9.5 mg/dL (ref 8.7–10.2)
Chloride: 89 mmol/L — ABNORMAL LOW (ref 96–106)
Creatinine, Ser: 0.59 mg/dL (ref 0.57–1.00)
GFR calc Af Amer: 128 mL/min/{1.73_m2} (ref >59)
GFR calc non Af Amer: 111 mL/min/{1.73_m2} (ref >59)
Globulin, Total: 3.2 g/dL (ref 1.5–4.5)
Glucose: 98 mg/dL (ref 65–99)
Potassium: 4.2 mmol/L (ref 3.5–5.2)
Sodium: 130 mmol/L — ABNORMAL LOW (ref 134–144)
Total Protein: 7.8 g/dL (ref 6.0–8.5)

## 2020-02-07 LAB — HEMOGLOBIN A1C
Est. average glucose Bld gHb Est-mCnc: 108 mg/dL
Hgb A1c MFr Bld: 5.4 % (ref 4.8–5.6)

## 2020-02-07 LAB — CBC WITH DIFFERENTIAL/PLATELET
Basophils Absolute: 0 10*3/uL (ref 0.0–0.2)
Basos: 1 %
EOS (ABSOLUTE): 0.3 10*3/uL (ref 0.0–0.4)
Eos: 6 %
Hematocrit: 37.2 % (ref 34.0–46.6)
Hemoglobin: 12.9 g/dL (ref 11.1–15.9)
Immature Grans (Abs): 0 10*3/uL (ref 0.0–0.1)
Immature Granulocytes: 0 %
Lymphocytes Absolute: 1.4 10*3/uL (ref 0.7–3.1)
Lymphs: 30 %
MCH: 35.2 pg — ABNORMAL HIGH (ref 26.6–33.0)
MCHC: 34.7 g/dL (ref 31.5–35.7)
MCV: 102 fL — ABNORMAL HIGH (ref 79–97)
Monocytes Absolute: 0.8 10*3/uL (ref 0.1–0.9)
Monocytes: 17 %
Neutrophils Absolute: 2.2 10*3/uL (ref 1.4–7.0)
Neutrophils: 46 %
Platelets: 183 10*3/uL (ref 150–450)
RBC: 3.66 x10E6/uL — ABNORMAL LOW (ref 3.77–5.28)
RDW: 12.9 % (ref 11.7–15.4)
WBC: 4.8 10*3/uL (ref 3.4–10.8)

## 2020-02-07 LAB — LIPID PANEL
Chol/HDL Ratio: 3 ratio (ref 0.0–4.4)
Cholesterol, Total: 203 mg/dL — ABNORMAL HIGH (ref 100–199)
HDL: 68 mg/dL (ref >39)
LDL Chol Calc (NIH): 99 mg/dL (ref 0–99)
Triglycerides: 213 mg/dL — ABNORMAL HIGH (ref 0–149)
VLDL Cholesterol Cal: 36 mg/dL (ref 5–40)

## 2020-02-07 LAB — TSH: TSH: 2.52 u[IU]/mL (ref 0.450–4.500)

## 2020-02-13 ENCOUNTER — Encounter: Payer: Self-pay | Admitting: Gerontology

## 2020-02-13 ENCOUNTER — Ambulatory Visit: Payer: Self-pay | Admitting: Gerontology

## 2020-02-13 ENCOUNTER — Other Ambulatory Visit: Payer: Self-pay

## 2020-02-13 DIAGNOSIS — F1010 Alcohol abuse, uncomplicated: Secondary | ICD-10-CM

## 2020-02-13 DIAGNOSIS — R002 Palpitations: Secondary | ICD-10-CM

## 2020-02-13 DIAGNOSIS — R899 Unspecified abnormal finding in specimens from other organs, systems and tissues: Secondary | ICD-10-CM

## 2020-02-13 NOTE — Progress Notes (Signed)
Established Patient Office Visit  Subjective:  Patient ID: [name], female    DOB: [birth year]  Age: 44 y.o. MRN: [id number]  CC: No chief complaint on file. Patient consents to telephone visit and 2 patient identifiers was used to identify patient.  HPI [name] presents for follow up of palpitation, alcohol consumption and lab review. She states that she's compliant with her medications, and has increased her water intake. She reports that she doesn't check her heart rate at home, but she's fine when she takes her 40 mg Propranolol. She states that she continues to drink 7-10 bottles of a beverage that contains 5% alcohol daily and she admits that she doesn't need to contact [facility] for help. Her lab done on 02/05/2020, Sodium was 130 mmol/L, AST was 78 and ALT 35, Total cholesterol was 203 mg/dl and Triglycerides was 213 mg/dl. She denies withdrawal symptoms, chest pain, light headedness. Overall, she states that she's doing well and offers no further complaint.  Past Medical History:  Diagnosis Date  . Abnormal Pap smear    Age 74  . 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 Alcohol abuse   . Anxiety   . Bacterial infection   . Cervical intraepithelial neoplasia III   . 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 Condyloma 2011  . H/O fatigue 2009  . H/O varicella   . Headache(784.0)   . HPV (human papilloma virus) anogenital infection   . Hx of dizziness 09/20/2011  . Hx: UTI (urinary tract infection)    Back pain  . Irregular menstrual cycle   . IV drug user   . Palpitations 2010  . Pelvic pain in female 12/05/11  . 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    Age 35  . Trichomonas   . Yeast infection     Past Surgical History:  Procedure Laterality Date  . A*wisdom teeth ext    . CERVICAL CONIZATION W/BX  11/30/2011   Procedure: CONIZATION CERVIX WITH BIOPSY;  Surgeon: [name], MD;  Location: [facility];   Service: Gynecology;  Laterality: N/A;  . CERVICAL CONIZATION W/BX  03/16/2012   Procedure: CONIZATION CERVIX WITH BIOPSY;  Surgeon: Ena Dawley, MD;  Location: [facility];  Service: Gynecology;  Laterality: N/A;  . CONIZATION CERVIX     x 3  . DILATION AND CURETTAGE OF UTERUS  11/30/2011   Procedure: DILATATION AND CURETTAGE;  Surgeon: [name], MD;  Location: [facility];  Service: Gynecology;  Laterality: N/A;  . svd     x 1    Family History  Problem Relation Age of Onset  . Cervical cancer Mother   . Breast cancer Mother   . Cancer Mother 3       Breast & cervical   . Stroke Maternal Grandmother   . COPD Maternal Grandfather        Emphysema  . Hypertension Paternal Grandfather   . Stroke Paternal Grandfather   . Cancer Father 49       prostate    Social History   Socioeconomic History  . Marital status: Divorced    Spouse name: Not on file  . Number of children: 1  . Years of education: Not on file  . Highest education level: Master's degree (e.g., MA, MS, MEng, MEd, MSW, MBA)  Occupational History  . Occupation: unemployed  Tobacco Use  . Smoking status: Current Every Day Smoker    Years: 30.00  Types: Cigarettes  . Smokeless tobacco: Former Systems developer  . Tobacco comment: less than 5 a day  Substance and Sexual Activity  . Alcohol use: Yes    Alcohol/week: 7.0 - 10.0 standard drinks    Types: 7 - 10 Glasses of wine per week    Comment: every day after work   . 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 Sexual activity: Not Currently    Birth control/protection: Abstinence  Other Topics Concern  . Not on file  Social History Narrative   Work or School: clinical addiction specialist      Home Situation: lives with daughter and wife      Spiritual Beliefs: none      Lifestyle: no regular exercise; good diet          On Food stamps   Social Determinants of Health   Financial Resource Strain: Medium Risk  .  Difficulty of Paying Living Expenses: Somewhat hard  Food Insecurity:   . Worried About Charity fundraiser in the Last Year: Not on file  . Ran Out of Food in the Last Year: Not on file  Transportation Needs:   . Lack of Transportation (Medical): Not on file  . Lack of Transportation (Non-Medical): Not on file  Physical Activity: Unknown  . Days of Exercise per Week: 7 days  . Minutes of Exercise per Session: Not on file  Stress:   . Feeling of Stress : Not on file  Social Connections:   . Frequency of Communication with Friends and Family: Not on file  . Frequency of Social Gatherings with Friends and Family: Not on file  . Attends Religious Services: Not on file  . Active Member of Clubs or Organizations: Not on file  . Attends Archivist Meetings: Not on file  . Marital Status: Not on file  Intimate Partner Violence:   . Fear of Current or Ex-Partner: Not on file  . Emotionally Abused: Not on file  . Physically Abused: Not on file  . Sexually Abused: Not on file    Outpatient Medications Prior to Visit  Medication Sig Dispense Refill  . propranolol (INDERAL) 40 MG tablet Take 1 tablet (40 mg total) by mouth every morning. 30 tablet 2  . amphetamine-dextroamphetamine (ADDERALL) 20 MG tablet Take 20 mg by mouth 3 (three) times daily.  0  . Multiple Vitamin (MULTIVITAMIN WITH MINERALS) TABS tablet Take 1 tablet by mouth daily. 30 tablet 0  . cetirizine (ZYRTEC) 10 MG chewable tablet Chew 1 tablet (10 mg total) by mouth daily. (Patient not taking: Reported on 02/13/2020) 30 tablet 2  . fluticasone (FLONASE) 50 MCG/ACT nasal spray Place 2 sprays into both nostrils daily. (Patient not taking: Reported on 02/13/2020) 9.9 g 0  . triamcinolone ointment (KENALOG) 0.5 % Apply 1 application topically 2 (two) times daily. (Patient not taking: Reported on 02/13/2020) 30 g 0   No facility-administered medications prior to visit.    No Known Allergies  ROS Review of Systems   Constitutional: Negative.   Respiratory: Negative.   Cardiovascular: Negative.   Neurological: Negative.   Psychiatric/Behavioral: Negative.       Objective:    Physical Exam No Physical exam was done There were no vitals taken for this visit. Wt Readings from Last 3 Encounters:  01/16/20 131 lb (59.4 kg)  09/19/19 125 lb (56.7 kg)  08/29/19 118 lb 4.8 oz (53.7 kg)  Health Maintenance Due  Topic Date Due  . TETANUS/TDAP  07/07/1993  . INFLUENZA VACCINE  07/20/2019    There are no preventive care reminders to display for this patient.  Lab Results  Component Value Date   TSH 2.520 02/05/2020   Lab Results  Component Value Date   WBC 4.8 02/05/2020   HGB 12.9 02/05/2020   HCT 37.2 02/05/2020   MCV 102 (H) 02/05/2020   PLT 183 02/05/2020   Lab Results  Component Value Date   NA 130 (L) 02/05/2020   K 4.2 02/05/2020   CHLORIDE 105 07/21/2014   CO2 22 02/05/2020   GLUCOSE 98 02/05/2020   BUN 7 02/05/2020   CREATININE 0.59 02/05/2020   BILITOT 0.5 02/05/2020   ALKPHOS 96 02/05/2020   AST 78 (H) 02/05/2020   ALT 35 (H) 02/05/2020   PROT 7.8 02/05/2020   ALBUMIN 4.6 02/05/2020   CALCIUM 9.5 02/05/2020   ANIONGAP 12 09/19/2019   Lab Results  Component Value Date   CHOL 203 (H) 02/05/2020   Lab Results  Component Value Date   HDL 68 02/05/2020   Lab Results  Component Value Date   LDLCALC 99 02/05/2020   Lab Results  Component Value Date   TRIG 213 (H) 02/05/2020   Lab Results  Component Value Date   CHOLHDL 3.0 02/05/2020   Lab Results  Component Value Date   HGBA1C 5.4 02/05/2020      Assessment & Plan:    1. Palpitations - evaluated by Dr. Cathie Olden in the past - She will continue on 40 mg Propranolol daily and was advised to notify clinic or go to the nearest ED for worsening symptoms. She was advised to increase water intake and check her heart rate, record and bring log to follow up appointment.  2. ETOH abuse - She was advised  on alcohol abstinence and advised to contact [facility] for substance abuse counseling.  3. Abnormal laboratory test result - Her AST was 78 and ALT 35, Sodium 130 and she was advised on alcohol abstinence.    Follow-up: Return in about 6 days (around 02/19/2020), or if symptoms worsen or fail to improve.    Kester Stimpson Jerold Coombe, NP

## 2020-02-14 DIAGNOSIS — R899 Unspecified abnormal finding in specimens from other organs, systems and tissues: Secondary | ICD-10-CM | POA: Insufficient documentation

## 2020-02-19 ENCOUNTER — Ambulatory Visit: Payer: Self-pay | Admitting: Gerontology

## 2020-02-19 ENCOUNTER — Telehealth: Payer: Self-pay | Admitting: Gerontology

## 2020-02-19 NOTE — Telephone Encounter (Signed)
-----   Message from Miles Costain sent at 02/19/2020  1:45 PM EST -----  ----- Message ----- From: Langston Reusing, NP Sent: 02/19/2020  11:26 AM EST To: Forest Becker  Ms Elienai no showed for her appointment, pls reschedule, call and notify with date and time. Thank you

## 2020-04-22 ENCOUNTER — Other Ambulatory Visit: Payer: Self-pay | Admitting: Gerontology

## 2020-04-22 DIAGNOSIS — I10 Essential (primary) hypertension: Secondary | ICD-10-CM

## 2020-04-22 DIAGNOSIS — R002 Palpitations: Secondary | ICD-10-CM

## 2020-05-19 ENCOUNTER — Ambulatory Visit: Payer: Self-pay

## 2020-05-21 ENCOUNTER — Ambulatory Visit: Payer: Self-pay

## 2020-05-28 ENCOUNTER — Encounter: Payer: Self-pay | Admitting: Gerontology

## 2020-05-28 ENCOUNTER — Other Ambulatory Visit: Payer: Self-pay

## 2020-05-28 ENCOUNTER — Ambulatory Visit: Payer: Self-pay | Admitting: Gerontology

## 2020-05-28 VITALS — BP 153/98 | HR 85 | Ht 60.0 in | Wt 138.0 lb

## 2020-05-28 DIAGNOSIS — F17200 Nicotine dependence, unspecified, uncomplicated: Secondary | ICD-10-CM

## 2020-05-28 DIAGNOSIS — R600 Localized edema: Secondary | ICD-10-CM

## 2020-05-28 DIAGNOSIS — IMO0001 Reserved for inherently not codable concepts without codable children: Secondary | ICD-10-CM

## 2020-05-28 DIAGNOSIS — I10 Essential (primary) hypertension: Secondary | ICD-10-CM

## 2020-05-28 DIAGNOSIS — R002 Palpitations: Secondary | ICD-10-CM

## 2020-05-28 DIAGNOSIS — F1010 Alcohol abuse, uncomplicated: Secondary | ICD-10-CM

## 2020-05-28 MED ORDER — PROPRANOLOL HCL 40 MG PO TABS: 40.0000 mg | ORAL_TABLET | ORAL | 3 refills | Status: DC

## 2020-05-28 NOTE — Patient Instructions (Signed)
DASH Eating Plan DASH stands for "Dietary Approaches to Stop Hypertension." The DASH eating plan is a healthy eating plan that has been shown to reduce high blood pressure (hypertension). It may also reduce your risk for type 2 diabetes, heart disease, and stroke. The DASH eating plan may also help with weight loss. What are tips for following this plan?  General guidelines  Avoid eating more than 2,300 mg (milligrams) of salt (sodium) a day. If you have hypertension, you may need to reduce your sodium intake to 1,500 mg a day.  Limit alcohol intake to no more than 1 drink a day for nonpregnant women and 2 drinks a day for men. One drink equals 12 oz of beer, 5 oz of wine, or 1 oz of hard liquor.  Work with your health care provider to maintain a healthy body weight or to lose weight. Ask what an ideal weight is for you.  Get at least 30 minutes of exercise that causes your heart to beat faster (aerobic exercise) most days of the week. Activities may include walking, swimming, or biking.  Work with your health care provider or diet and nutrition specialist (dietitian) to adjust your eating plan to your individual calorie needs. Reading food labels   Check food labels for the amount of sodium per serving. Choose foods with less than 5 percent of the Daily Value of sodium. Generally, foods with less than 300 mg of sodium per serving fit into this eating plan.  To find whole grains, look for the word "whole" as the first word in the ingredient list. Shopping  Buy products labeled as "low-sodium" or "no salt added."  Buy fresh foods. Avoid canned foods and premade or frozen meals. Cooking  Avoid adding salt when cooking. Use salt-free seasonings or herbs instead of table salt or sea salt. Check with your health care provider or pharmacist before using salt substitutes.  Do not fry foods. Cook foods using healthy methods such as baking, boiling, grilling, and broiling instead.  Cook with  heart-healthy oils, such as olive, canola, soybean, or sunflower oil. Meal planning  Eat a balanced diet that includes: ? 5 or more servings of fruits and vegetables each day. At each meal, try to fill half of your plate with fruits and vegetables. ? Up to 6-8 servings of whole grains each day. ? Less than 6 oz of lean meat, poultry, or fish each day. A 3-oz serving of meat is about the same size as a deck of cards. One egg equals 1 oz. ? 2 servings of low-fat dairy each day. ? A serving of nuts, seeds, or beans 5 times each week. ? Heart-healthy fats. Healthy fats called Omega-3 fatty acids are found in foods such as flaxseeds and coldwater fish, like sardines, salmon, and mackerel.  Limit how much you eat of the following: ? Canned or prepackaged foods. ? Food that is high in trans fat, such as fried foods. ? Food that is high in saturated fat, such as fatty meat. ? Sweets, desserts, sugary drinks, and other foods with added sugar. ? Full-fat dairy products.  Do not salt foods before eating.  Try to eat at least 2 vegetarian meals each week.  Eat more home-cooked food and less restaurant, buffet, and fast food.  When eating at a restaurant, ask that your food be prepared with less salt or no salt, if possible. What foods are recommended? The items listed may not be a complete list. Talk with your dietitian about   what dietary choices are best for you. Grains Whole-grain or whole-wheat bread. Whole-grain or whole-wheat pasta. Brown rice. Oatmeal. Quinoa. Bulgur. Whole-grain and low-sodium cereals. Pita bread. Low-fat, low-sodium crackers. Whole-wheat flour tortillas. Vegetables Fresh or frozen vegetables (raw, steamed, roasted, or grilled). Low-sodium or reduced-sodium tomato and vegetable juice. Low-sodium or reduced-sodium tomato sauce and tomato paste. Low-sodium or reduced-sodium canned vegetables. Fruits All fresh, dried, or frozen fruit. Canned fruit in natural juice (without  added sugar). Meat and other protein foods Skinless chicken or turkey. Ground chicken or turkey. Pork with fat trimmed off. Fish and seafood. Egg whites. Dried beans, peas, or lentils. Unsalted nuts, nut butters, and seeds. Unsalted canned beans. Lean cuts of beef with fat trimmed off. Low-sodium, lean deli meat. Dairy Low-fat (1%) or fat-free (skim) milk. Fat-free, low-fat, or reduced-fat cheeses. Nonfat, low-sodium ricotta or cottage cheese. Low-fat or nonfat yogurt. Low-fat, low-sodium cheese. Fats and oils Soft margarine without trans fats. Vegetable oil. Low-fat, reduced-fat, or light mayonnaise and salad dressings (reduced-sodium). Canola, safflower, olive, soybean, and sunflower oils. Avocado. Seasoning and other foods Herbs. Spices. Seasoning mixes without salt. Unsalted popcorn and pretzels. Fat-free sweets. What foods are not recommended? The items listed may not be a complete list. Talk with your dietitian about what dietary choices are best for you. Grains Baked goods made with fat, such as croissants, muffins, or some breads. Dry pasta or rice meal packs. Vegetables Creamed or fried vegetables. Vegetables in a cheese sauce. Regular canned vegetables (not low-sodium or reduced-sodium). Regular canned tomato sauce and paste (not low-sodium or reduced-sodium). Regular tomato and vegetable juice (not low-sodium or reduced-sodium). Pickles. Olives. Fruits Canned fruit in a light or heavy syrup. Fried fruit. Fruit in cream or butter sauce. Meat and other protein foods Fatty cuts of meat. Ribs. Fried meat. Bacon. Sausage. Bologna and other processed lunch meats. Salami. Fatback. Hotdogs. Bratwurst. Salted nuts and seeds. Canned beans with added salt. Canned or smoked fish. Whole eggs or egg yolks. Chicken or turkey with skin. Dairy Whole or 2% milk, cream, and half-and-half. Whole or full-fat cream cheese. Whole-fat or sweetened yogurt. Full-fat cheese. Nondairy creamers. Whipped toppings.  Processed cheese and cheese spreads. Fats and oils Butter. Stick margarine. Lard. Shortening. Ghee. Bacon fat. Tropical oils, such as coconut, palm kernel, or palm oil. Seasoning and other foods Salted popcorn and pretzels. Onion salt, garlic salt, seasoned salt, table salt, and sea salt. Worcestershire sauce. Tartar sauce. Barbecue sauce. Teriyaki sauce. Soy sauce, including reduced-sodium. Steak sauce. Canned and packaged gravies. Fish sauce. Oyster sauce. Cocktail sauce. Horseradish that you find on the shelf. Ketchup. Mustard. Meat flavorings and tenderizers. Bouillon cubes. Hot sauce and Tabasco sauce. Premade or packaged marinades. Premade or packaged taco seasonings. Relishes. Regular salad dressings. Where to find more information:  National Heart, Lung, and Blood Institute: www.nhlbi.nih.gov  American Heart Association: www.heart.org Summary  The DASH eating plan is a healthy eating plan that has been shown to reduce high blood pressure (hypertension). It may also reduce your risk for type 2 diabetes, heart disease, and stroke.  With the DASH eating plan, you should limit salt (sodium) intake to 2,300 mg a day. If you have hypertension, you may need to reduce your sodium intake to 1,500 mg a day.  When on the DASH eating plan, aim to eat more fresh fruits and vegetables, whole grains, lean proteins, low-fat dairy, and heart-healthy fats.  Work with your health care provider or diet and nutrition specialist (dietitian) to adjust your eating plan to your   individual calorie needs. This information is not intended to replace advice given to you by your health care provider. Make sure you discuss any questions you have with your health care provider. Document Revised: 11/17/2017 Document Reviewed: 11/28/2016 Elsevier Patient Education  2020 [employer]  

## 2020-05-28 NOTE — Progress Notes (Signed)
Established Patient Office Visit  Subjective:  Patient ID: [name], female    DOB: [birth year]  Age: 44 y.o. MRN: [id number]  CC: No chief complaint on file.   HPI [name] is a 44 y. o female who presents to the clinic for f/u palpitation and ETOH. She verbalized drinking about four  8 ounces of beer every other day. She smokes about 2 packs a week, an admits the desire to quit.  She  verbalized being compliant with medication but reports running out her Propranolol about 3 days ago which has exacerbated her high blood pressure and heart palpitation. She verbalized not wanting to quit drinking at the moment. On examination, right lower leg was swollen and she reports that it has been going on for a few days. She reports claudication while walking, and denies shortness of breath.   Past Medical History:  Diagnosis Date  . Abnormal Pap smear    Age 44  . 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 Alcohol abuse   . Anxiety   . Bacterial infection   . Cervical intraepithelial neoplasia III   . 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 Condyloma 2011  . H/O fatigue 2009  . H/O varicella   . Headache(784.0)   . HPV (human papilloma virus) anogenital infection   . Hx of dizziness 09/20/2011  . Hx: UTI (urinary tract infection)    Back pain  . Irregular menstrual cycle   . IV drug user   . Palpitations 2010  . Pelvic pain in female 12/05/11  . 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    Age 44  . Trichomonas   . Yeast infection     Past Surgical History:  Procedure Laterality Date  . A*wisdom teeth ext    . CERVICAL CONIZATION W/BX  11/30/2011   Procedure: CONIZATION CERVIX WITH BIOPSY;  Surgeon: [name], MD;  Location: [facility];  Service: Gynecology;  Laterality: N/A;  . CERVICAL CONIZATION W/BX  03/16/2012   Procedure: CONIZATION CERVIX WITH BIOPSY;  Surgeon: Ena Dawley, MD;  Location: [facility];  Service:  Gynecology;  Laterality: N/A;  . CONIZATION CERVIX     x 3  . DILATION AND CURETTAGE OF UTERUS  11/30/2011   Procedure: DILATATION AND CURETTAGE;  Surgeon: [name], MD;  Location: [facility];  Service: Gynecology;  Laterality: N/A;  . svd     x 1    Family History  Problem Relation Age of Onset  . Cervical cancer Mother   . Breast cancer Mother   . Cancer Mother 57       Breast & cervical   . Stroke Maternal Grandmother   . COPD Maternal Grandfather        Emphysema  . Hypertension Paternal Grandfather   . Stroke Paternal Grandfather   . Cancer Father 54       prostate    Social History   Socioeconomic History  . Marital status: Divorced    Spouse name: Not on file  . Number of children: 1  . Years of education: Not on file  . Highest education level: Master's degree (e.g., MA, MS, MEng, MEd, MSW, MBA)  Occupational History  . Occupation: unemployed  Tobacco Use  . Smoking status: Current Every Day Smoker    Years: 30.00    Types: Cigarettes  . Smokeless tobacco: Former Systems developer  . Tobacco comment: less than 8 a day  Vaping  Use  . Vaping Use: Never used  Substance and Sexual Activity  . Alcohol use: Yes    Alcohol/week: 7.0 - 10.0 standard drinks    Types: 7 - 10 Glasses of wine per week    Comment: every day after work   . 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 Sexual activity: Not Currently    Birth control/protection: Abstinence  Other Topics Concern  . Not on file  Social History Narrative   Work or School: clinical addiction specialist      Home Situation: lives with daughter and wife      Spiritual Beliefs: none      Lifestyle: no regular exercise; good diet          On Food stamps   Social Determinants of Health   Financial Resource Strain: Medium Risk  . Difficulty of Paying Living Expenses: Somewhat hard  Food Insecurity:   . Worried About Charity fundraiser in the Last Year:   .  Arboriculturist in the Last Year:   Transportation Needs:   . Film/video editor (Medical):   Marland Kitchen Lack of Transportation (Non-Medical):   Physical Activity: Unknown  . Days of Exercise per Week: 7 days  . Minutes of Exercise per Session: Not on file  Stress:   . Feeling of Stress :   Social Connections:   . Frequency of Communication with Friends and Family:   . Frequency of Social Gatherings with Friends and Family:   . Attends Religious Services:   . Active Member of Clubs or Organizations:   . Attends Archivist Meetings:   Marland Kitchen Marital Status:   Intimate Partner Violence:   . Fear of Current or Ex-Partner:   . Emotionally Abused:   Marland Kitchen Physically Abused:   . Sexually Abused:     Outpatient Medications Prior to Visit  Medication Sig Dispense Refill  . Multiple Vitamin (MULTIVITAMIN WITH MINERALS) TABS tablet Take 1 tablet by mouth daily. 30 tablet 0  . propranolol (INDERAL) 40 MG tablet Take 1 tablet (40 mg total) by mouth every morning. 30 tablet 2  . amphetamine-dextroamphetamine (ADDERALL) 20 MG tablet Take 20 mg by mouth 3 (three) times daily.  0   No facility-administered medications prior to visit.    No Known Allergies  ROS Review of Systems  Constitutional: Negative.   Eyes: Negative.   Respiratory: Positive for wheezing.   Cardiovascular: Negative.   Neurological: Negative.   Psychiatric/Behavioral: Negative.       Objective:    Physical Exam Constitutional:      Appearance: Normal appearance.  HENT:     Head: Normocephalic.  Cardiovascular:     Rate and Rhythm: Normal rate and regular rhythm.     Pulses: Normal pulses.     Heart sounds: Normal heart sounds.  Pulmonary:     Breath sounds: Wheezing present.  Musculoskeletal:        General: Tenderness (.Palpation to RLL) present.     Right lower leg: Edema (.non-pitting) present.  Neurological:     General: No focal deficit present.     Mental Status: She is alert and oriented to  person, place, and time.  Psychiatric:        Mood and Affect: Mood normal.        Behavior: Behavior normal.        Thought Content: Thought content normal.  Judgment: Judgment normal.     BP (!) 153/98 (BP Location: Left Arm, Patient Position: Sitting)   Pulse 85   Ht 5' (1.524 m)   Wt 138 lb (62.6 kg)   SpO2 95%   BMI 26.95 kg/m  Wt Readings from Last 3 Encounters:  05/28/20 138 lb (62.6 kg)  01/16/20 131 lb (59.4 kg)  09/19/19 125 lb (56.7 kg)     Health Maintenance Due  Topic Date Due  . Hepatitis C Screening  Never done  . TETANUS/TDAP  Never done    There are no preventive care reminders to display for this patient.  Lab Results  Component Value Date   TSH 2.520 02/05/2020   Lab Results  Component Value Date   WBC 4.8 02/05/2020   HGB 12.9 02/05/2020   HCT 37.2 02/05/2020   MCV 102 (H) 02/05/2020   PLT 183 02/05/2020   Lab Results  Component Value Date   NA 130 (L) 02/05/2020   K 4.2 02/05/2020   CHLORIDE 105 07/21/2014   CO2 22 02/05/2020   GLUCOSE 98 02/05/2020   BUN 7 02/05/2020   CREATININE 0.59 02/05/2020   BILITOT 0.5 02/05/2020   ALKPHOS 96 02/05/2020   AST 78 (H) 02/05/2020   ALT 35 (H) 02/05/2020   PROT 7.8 02/05/2020   ALBUMIN 4.6 02/05/2020   CALCIUM 9.5 02/05/2020   ANIONGAP 12 09/19/2019   Lab Results  Component Value Date   CHOL 203 (H) 02/05/2020   Lab Results  Component Value Date   HDL 68 02/05/2020   Lab Results  Component Value Date   LDLCALC 99 02/05/2020   Lab Results  Component Value Date   TRIG 213 (H) 02/05/2020   Lab Results  Component Value Date   CHOLHDL 3.0 02/05/2020   Lab Results  Component Value Date   HGBA1C 5.4 02/05/2020      Assessment & Plan:   1. Palpitations - evaluated by Dr. Cathie Olden in the past Continue medication as prescribed  - propranolol (INDERAL) 40 MG tablet; Take 1 tablet (40 mg total) by mouth every morning.  Dispense: 30 tablet; Refill: 3  2. Essential  hypertension  Continue on DASH diet and exercise as tolerated Continue medication as prescribed  - propranolol (INDERAL) 40 MG tablet; Take 1 tablet (40 mg total) by mouth every morning.  Dispense: 30 tablet; Refill: 3  3. ETOH abuse Was encouraged to abstain from ETOH    4. Smoking Was encourage to restart smoking cessation. Mild expiratory wheezing note on bilateral upper anterior lobes. Was advise to notify provider or go to the nearest emergency room if symptoms worsens.  5. Edema of right lower leg Right calf was measured to be 14.5cm and the left calf was 13.5cm. Unable to perform diagnostic procedure to r/o DVT. She was strongly advised to seek care at Emergency room as soon as possible.       Follow-up: Return in about 3 weeks (around 06/18/2020), or if symptoms worsen or fail to improve.    Carney Corners, RN

## 2020-06-18 ENCOUNTER — Ambulatory Visit: Payer: Self-pay | Admitting: Gerontology

## 2020-07-02 ENCOUNTER — Ambulatory Visit: Payer: Self-pay | Admitting: Gerontology

## 2020-07-09 ENCOUNTER — Telehealth: Payer: Self-pay | Admitting: Gerontology

## 2020-07-09 NOTE — Telephone Encounter (Signed)
Individual has been called regarding appt change but no voicemail box was set up.

## 2020-07-15 ENCOUNTER — Ambulatory Visit: Payer: Self-pay | Admitting: Gerontology

## 2020-07-16 ENCOUNTER — Ambulatory Visit: Payer: Self-pay | Admitting: Gerontology

## 2020-10-01 ENCOUNTER — Ambulatory Visit: Payer: Self-pay

## 2020-11-26 ENCOUNTER — Other Ambulatory Visit: Payer: Self-pay | Admitting: Gerontology

## 2020-11-26 DIAGNOSIS — I10 Essential (primary) hypertension: Secondary | ICD-10-CM

## 2020-11-26 DIAGNOSIS — R002 Palpitations: Secondary | ICD-10-CM

## 2020-12-02 ENCOUNTER — Ambulatory Visit: Payer: Self-pay | Admitting: Adult Health

## 2020-12-15 ENCOUNTER — Ambulatory Visit: Payer: Self-pay | Admitting: Gerontology

## 2020-12-15 ENCOUNTER — Other Ambulatory Visit: Payer: Self-pay | Admitting: Gerontology

## 2020-12-15 DIAGNOSIS — R002 Palpitations: Secondary | ICD-10-CM

## 2020-12-15 DIAGNOSIS — I10 Essential (primary) hypertension: Secondary | ICD-10-CM

## 2020-12-19 DIAGNOSIS — M86462 Chronic osteomyelitis with draining sinus, left tibia and fibula: Secondary | ICD-10-CM

## 2020-12-19 DIAGNOSIS — U071 COVID-19: Secondary | ICD-10-CM

## 2020-12-19 HISTORY — DX: Chronic osteomyelitis with draining sinus, left tibia and fibula: M86.462

## 2020-12-19 HISTORY — DX: COVID-19: U07.1

## 2020-12-30 ENCOUNTER — Ambulatory Visit: Payer: Self-pay | Admitting: Adult Health

## 2021-01-04 ENCOUNTER — Emergency Department: Payer: Self-pay

## 2021-01-04 ENCOUNTER — Other Ambulatory Visit: Payer: Self-pay

## 2021-01-04 ENCOUNTER — Inpatient Hospital Stay
Admission: EM | Admit: 2021-01-04 | Discharge: 2021-01-09 | DRG: 871 | Disposition: A | Discharge status: Other Acute Inpatient Hospital | Payer: HRSA Program | Attending: Internal Medicine | Admitting: Internal Medicine

## 2021-01-04 ENCOUNTER — Encounter: Payer: Self-pay | Admitting: Emergency Medicine

## 2021-01-04 DIAGNOSIS — Z803 Family history of malignant neoplasm of breast: Secondary | ICD-10-CM

## 2021-01-04 DIAGNOSIS — K8020 Calculus of gallbladder without cholecystitis without obstruction: Secondary | ICD-10-CM | POA: Diagnosis present

## 2021-01-04 DIAGNOSIS — F1020 Alcohol dependence, uncomplicated: Secondary | ICD-10-CM | POA: Diagnosis present

## 2021-01-04 DIAGNOSIS — R899 Unspecified abnormal finding in specimens from other organs, systems and tissues: Secondary | ICD-10-CM | POA: Diagnosis not present

## 2021-01-04 DIAGNOSIS — Z823 Family history of stroke: Secondary | ICD-10-CM

## 2021-01-04 DIAGNOSIS — I10 Essential (primary) hypertension: Secondary | ICD-10-CM | POA: Diagnosis present

## 2021-01-04 DIAGNOSIS — Z825 Family history of asthma and other chronic lower respiratory diseases: Secondary | ICD-10-CM

## 2021-01-04 DIAGNOSIS — F419 Anxiety disorder, unspecified: Secondary | ICD-10-CM | POA: Diagnosis not present

## 2021-01-04 DIAGNOSIS — E871 Hypo-osmolality and hyponatremia: Secondary | ICD-10-CM | POA: Diagnosis present

## 2021-01-04 DIAGNOSIS — A4189 Other specified sepsis: Principal | ICD-10-CM | POA: Diagnosis present

## 2021-01-04 DIAGNOSIS — R531 Weakness: Secondary | ICD-10-CM

## 2021-01-04 DIAGNOSIS — J159 Unspecified bacterial pneumonia: Secondary | ICD-10-CM | POA: Diagnosis present

## 2021-01-04 DIAGNOSIS — Z6825 Body mass index (BMI) 25.0-25.9, adult: Secondary | ICD-10-CM

## 2021-01-04 DIAGNOSIS — R7401 Elevation of levels of liver transaminase levels: Secondary | ICD-10-CM

## 2021-01-04 DIAGNOSIS — J690 Pneumonitis due to inhalation of food and vomit: Secondary | ICD-10-CM | POA: Diagnosis not present

## 2021-01-04 DIAGNOSIS — R188 Other ascites: Secondary | ICD-10-CM | POA: Diagnosis present

## 2021-01-04 DIAGNOSIS — I959 Hypotension, unspecified: Secondary | ICD-10-CM | POA: Diagnosis present

## 2021-01-04 DIAGNOSIS — U071 COVID-19: Secondary | ICD-10-CM | POA: Diagnosis present

## 2021-01-04 DIAGNOSIS — J90 Pleural effusion, not elsewhere classified: Secondary | ICD-10-CM

## 2021-01-04 DIAGNOSIS — K760 Fatty (change of) liver, not elsewhere classified: Secondary | ICD-10-CM

## 2021-01-04 DIAGNOSIS — E440 Moderate protein-calorie malnutrition: Secondary | ICD-10-CM | POA: Diagnosis present

## 2021-01-04 DIAGNOSIS — R002 Palpitations: Secondary | ICD-10-CM | POA: Diagnosis present

## 2021-01-04 DIAGNOSIS — J1282 Pneumonia due to coronavirus disease 2019: Secondary | ICD-10-CM | POA: Diagnosis present

## 2021-01-04 DIAGNOSIS — F1010 Alcohol abuse, uncomplicated: Secondary | ICD-10-CM | POA: Diagnosis not present

## 2021-01-04 DIAGNOSIS — G9341 Metabolic encephalopathy: Secondary | ICD-10-CM | POA: Diagnosis not present

## 2021-01-04 DIAGNOSIS — F17210 Nicotine dependence, cigarettes, uncomplicated: Secondary | ICD-10-CM | POA: Diagnosis present

## 2021-01-04 DIAGNOSIS — D649 Anemia, unspecified: Secondary | ICD-10-CM | POA: Diagnosis not present

## 2021-01-04 DIAGNOSIS — Z8249 Family history of ischemic heart disease and other diseases of the circulatory system: Secondary | ICD-10-CM

## 2021-01-04 DIAGNOSIS — J189 Pneumonia, unspecified organism: Secondary | ICD-10-CM

## 2021-01-04 DIAGNOSIS — Z8541 Personal history of malignant neoplasm of cervix uteri: Secondary | ICD-10-CM

## 2021-01-04 DIAGNOSIS — Z886 Allergy status to analgesic agent status: Secondary | ICD-10-CM

## 2021-01-04 DIAGNOSIS — N179 Acute kidney failure, unspecified: Secondary | ICD-10-CM | POA: Diagnosis present

## 2021-01-04 DIAGNOSIS — Z8049 Family history of malignant neoplasm of other genital organs: Secondary | ICD-10-CM

## 2021-01-04 DIAGNOSIS — J9601 Acute respiratory failure with hypoxia: Secondary | ICD-10-CM | POA: Diagnosis present

## 2021-01-04 DIAGNOSIS — J869 Pyothorax without fistula: Secondary | ICD-10-CM | POA: Diagnosis not present

## 2021-01-04 DIAGNOSIS — Z9889 Other specified postprocedural states: Secondary | ICD-10-CM

## 2021-01-04 DIAGNOSIS — R0902 Hypoxemia: Secondary | ICD-10-CM

## 2021-01-04 DIAGNOSIS — E876 Hypokalemia: Secondary | ICD-10-CM | POA: Diagnosis not present

## 2021-01-04 LAB — DIFFERENTIAL
Abs Immature Granulocytes: 0.69 10*3/uL — ABNORMAL HIGH (ref 0.00–0.07)
Basophils Absolute: 0.1 10*3/uL (ref 0.0–0.1)
Basophils Relative: 0 %
Eosinophils Absolute: 0.1 10*3/uL (ref 0.0–0.5)
Eosinophils Relative: 0 %
Immature Granulocytes: 3 %
Lymphocytes Relative: 4 %
Lymphs Abs: 1 10*3/uL (ref 0.7–4.0)
Monocytes Absolute: 0.6 10*3/uL (ref 0.1–1.0)
Monocytes Relative: 2 %
Neutro Abs: 21.2 10*3/uL — ABNORMAL HIGH (ref 1.7–7.7)
Neutrophils Relative %: 91 %
Smear Review: NORMAL

## 2021-01-04 LAB — COMPREHENSIVE METABOLIC PANEL
ALT: 27 U/L (ref 0–44)
ALT: 31 U/L (ref 0–44)
AST: 145 U/L — ABNORMAL HIGH (ref 15–41)
AST: 155 U/L — ABNORMAL HIGH (ref 15–41)
Albumin: 2.3 g/dL — ABNORMAL LOW (ref 3.5–5.0)
Albumin: 2.3 g/dL — ABNORMAL LOW (ref 3.5–5.0)
Alkaline Phosphatase: 96 U/L (ref 38–126)
Alkaline Phosphatase: 102 U/L (ref 38–126)
Anion gap: 23 — ABNORMAL HIGH (ref 5–15)
Anion gap: 21 — ABNORMAL HIGH (ref 5–15)
BUN: 30 mg/dL — ABNORMAL HIGH (ref 6–20)
BUN: 30 mg/dL — ABNORMAL HIGH (ref 6–20)
CO2: 21 mmol/L — ABNORMAL LOW (ref 22–32)
CO2: 24 mmol/L (ref 22–32)
Calcium: 7.4 mg/dL — ABNORMAL LOW (ref 8.9–10.3)
Calcium: 7.4 mg/dL — ABNORMAL LOW (ref 8.9–10.3)
Chloride: 69 mmol/L — ABNORMAL LOW (ref 98–111)
Chloride: 69 mmol/L — ABNORMAL LOW (ref 98–111)
Creatinine, Ser: 1.28 mg/dL — ABNORMAL HIGH (ref 0.44–1.00)
Creatinine, Ser: 1.38 mg/dL — ABNORMAL HIGH (ref 0.44–1.00)
GFR, Estimated: 52 mL/min — ABNORMAL LOW (ref >60)
GFR, Estimated: 48 mL/min — ABNORMAL LOW (ref >60)
Glucose, Bld: 94 mg/dL (ref 70–99)
Glucose, Bld: 97 mg/dL (ref 70–99)
Potassium: 2.9 mmol/L — ABNORMAL LOW (ref 3.5–5.1)
Potassium: 2.8 mmol/L — ABNORMAL LOW (ref 3.5–5.1)
Sodium: 113 mmol/L — CL (ref 135–145)
Sodium: 114 mmol/L — CL (ref 135–145)
Total Bilirubin: 4 mg/dL — ABNORMAL HIGH (ref 0.3–1.2)
Total Bilirubin: 4.3 mg/dL — ABNORMAL HIGH (ref 0.3–1.2)
Total Protein: 6.4 g/dL — ABNORMAL LOW (ref 6.5–8.1)
Total Protein: 6.8 g/dL (ref 6.5–8.1)

## 2021-01-04 LAB — LACTIC ACID, PLASMA
Lactic Acid, Venous: 2.6 mmol/L (ref 0.5–1.9)
Lactic Acid, Venous: 3 mmol/L (ref 0.5–1.9)

## 2021-01-04 LAB — PROCALCITONIN: Procalcitonin: 3.45 ng/mL

## 2021-01-04 LAB — CBC
HCT: 26.5 % — ABNORMAL LOW (ref 36.0–46.0)
Hemoglobin: 9.7 g/dL — ABNORMAL LOW (ref 12.0–15.0)
MCH: 33.3 pg (ref 26.0–34.0)
MCHC: 36.6 g/dL — ABNORMAL HIGH (ref 30.0–36.0)
MCV: 91.1 fL (ref 80.0–100.0)
Platelets: 229 10*3/uL (ref 150–400)
RBC: 2.91 MIL/uL — ABNORMAL LOW (ref 3.87–5.11)
RDW: 12.6 % (ref 11.5–15.5)
WBC: 23.7 10*3/uL — ABNORMAL HIGH (ref 4.0–10.5)
nRBC: 0.4 % — ABNORMAL HIGH (ref 0.0–0.2)

## 2021-01-04 LAB — BASIC METABOLIC PANEL
Anion gap: 20 — ABNORMAL HIGH (ref 5–15)
BUN: 29 mg/dL — ABNORMAL HIGH (ref 6–20)
CO2: 22 mmol/L (ref 22–32)
Calcium: 7 mg/dL — ABNORMAL LOW (ref 8.9–10.3)
Chloride: 73 mmol/L — ABNORMAL LOW (ref 98–111)
Creatinine, Ser: 1.23 mg/dL — ABNORMAL HIGH (ref 0.44–1.00)
GFR, Estimated: 55 mL/min — ABNORMAL LOW (ref >60)
Glucose, Bld: 88 mg/dL (ref 70–99)
Potassium: 2.6 mmol/L — CL (ref 3.5–5.1)
Sodium: 115 mmol/L — CL (ref 135–145)

## 2021-01-04 LAB — PROTIME-INR
INR: 1.3 — ABNORMAL HIGH (ref 0.8–1.2)
Prothrombin Time: 16 seconds — ABNORMAL HIGH (ref 11.4–15.2)

## 2021-01-04 LAB — RESP PANEL BY RT-PCR (FLU A&B, COVID) ARPGX2
Influenza A by PCR: NEGATIVE
Influenza B by PCR: NEGATIVE
SARS Coronavirus 2 by RT PCR: POSITIVE — AB

## 2021-01-04 LAB — MAGNESIUM: Magnesium: 1.4 mg/dL — ABNORMAL LOW (ref 1.7–2.4)

## 2021-01-04 LAB — ETHANOL: Alcohol, Ethyl (B): <10 mg/dL (ref <10)

## 2021-01-04 LAB — APTT: aPTT: 38 seconds — ABNORMAL HIGH (ref 24–36)

## 2021-01-04 LAB — PHOSPHORUS: Phosphorus: 3.2 mg/dL (ref 2.5–4.6)

## 2021-01-04 MED ORDER — DEXAMETHASONE SODIUM PHOSPHATE 10 MG/ML IJ SOLN
10.0000 mg | Freq: Once | INTRAMUSCULAR | Status: AC
  Administered 2021-01-04: 10 mg via INTRAVENOUS
  Filled 2021-01-04: qty 1

## 2021-01-04 MED ORDER — ONDANSETRON HCL 4 MG/2ML IJ SOLN: 4.0000 mg | Freq: Four times a day (QID) | INTRAMUSCULAR | Status: DC | PRN

## 2021-01-04 MED ORDER — THIAMINE HCL 100 MG PO TABS
100.0000 mg | ORAL_TABLET | Freq: Every day | ORAL | Status: DC
  Administered 2021-01-05 – 2021-01-09 (×5): 100 mg via ORAL
  Filled 2021-01-04 (×6): qty 1

## 2021-01-04 MED ORDER — ADULT MULTIVITAMIN W/MINERALS CH: 1.0000 | ORAL_TABLET | Freq: Every day | ORAL | Status: DC

## 2021-01-04 MED ORDER — POTASSIUM CHLORIDE CRYS ER 20 MEQ PO TBCR: 40.0000 meq | EXTENDED_RELEASE_TABLET | Freq: Two times a day (BID) | ORAL | Status: DC

## 2021-01-04 MED ORDER — SODIUM CHLORIDE 0.9 % IV SOLN: 100.0000 mg | Freq: Every day | INTRAVENOUS | Status: DC

## 2021-01-04 MED ORDER — DEXAMETHASONE SODIUM PHOSPHATE 10 MG/ML IJ SOLN: 6.0000 mg | INTRAMUSCULAR | Status: DC

## 2021-01-04 MED ORDER — SODIUM CHLORIDE 0.9 % IV SOLN: 1.0000 g | INTRAVENOUS | Status: DC

## 2021-01-04 MED ORDER — ONDANSETRON HCL 4 MG PO TABS: 4.0000 mg | ORAL_TABLET | Freq: Four times a day (QID) | ORAL | Status: DC | PRN

## 2021-01-04 MED ORDER — NICOTINE 21 MG/24HR TD PT24
21.0000 mg | MEDICATED_PATCH | Freq: Once | TRANSDERMAL | Status: AC
  Administered 2021-01-04: 21 mg via TRANSDERMAL
  Filled 2021-01-04: qty 1

## 2021-01-04 MED ORDER — POTASSIUM CHLORIDE CRYS ER 20 MEQ PO TBCR: 40.0000 meq | EXTENDED_RELEASE_TABLET | Freq: Once | ORAL | Status: DC

## 2021-01-04 MED ORDER — SODIUM CHLORIDE 0.9 % IV SOLN
100.0000 mg | Freq: Every day | INTRAVENOUS | Status: AC
  Administered 2021-01-05 – 2021-01-08 (×4): 100 mg via INTRAVENOUS
  Filled 2021-01-04: qty 100
  Filled 2021-01-04 (×3): qty 20

## 2021-01-04 MED ORDER — ADULT MULTIVITAMIN W/MINERALS CH
1.0000 | ORAL_TABLET | Freq: Every day | ORAL | Status: DC
  Administered 2021-01-04 – 2021-01-09 (×6): 1 via ORAL
  Filled 2021-01-04 (×6): qty 1

## 2021-01-04 MED ORDER — POTASSIUM CHLORIDE CRYS ER 20 MEQ PO TBCR
40.0000 meq | EXTENDED_RELEASE_TABLET | Freq: Two times a day (BID) | ORAL | Status: AC
  Administered 2021-01-04 – 2021-01-05 (×2): 40 meq via ORAL
  Filled 2021-01-04 (×2): qty 2

## 2021-01-04 MED ORDER — ACETAMINOPHEN 325 MG PO TABS: 325.0000 mg | ORAL_TABLET | Freq: Four times a day (QID) | ORAL | Status: DC | PRN

## 2021-01-04 MED ORDER — FOLIC ACID 1 MG PO TABS
1.0000 mg | ORAL_TABLET | Freq: Every day | ORAL | Status: DC
  Administered 2021-01-04 – 2021-01-09 (×6): 1 mg via ORAL
  Filled 2021-01-04 (×6): qty 1

## 2021-01-04 MED ORDER — SODIUM CHLORIDE 0.9 % IV SOLN
200.0000 mg | Freq: Once | INTRAVENOUS | Status: AC
  Administered 2021-01-04: 200 mg via INTRAVENOUS
  Filled 2021-01-04: qty 40

## 2021-01-04 MED ORDER — POTASSIUM CHLORIDE 10 MEQ/100ML IV SOLN
10.0000 meq | INTRAVENOUS | Status: AC
Start: 2021-01-05 — End: 2021-01-05
  Administered 2021-01-05 (×4): 10 meq via INTRAVENOUS
  Filled 2021-01-04 (×4): qty 100

## 2021-01-04 MED ORDER — ENOXAPARIN SODIUM 40 MG/0.4ML ~~LOC~~ SOLN
40.0000 mg | SUBCUTANEOUS | Status: DC
  Administered 2021-01-04 – 2021-01-08 (×5): 40 mg via SUBCUTANEOUS
  Filled 2021-01-04 (×5): qty 0.4

## 2021-01-04 MED ORDER — LACTATED RINGERS IV BOLUS: 500.0000 mL | Freq: Once | INTRAVENOUS | Status: DC

## 2021-01-04 MED ORDER — SODIUM CHLORIDE 0.9 % IV SOLN
500.0000 mg | INTRAVENOUS | Status: DC
  Administered 2021-01-04: 500 mg via INTRAVENOUS
  Filled 2021-01-04: qty 500

## 2021-01-04 MED ORDER — LACTATED RINGERS IV BOLUS
1000.0000 mL | Freq: Once | INTRAVENOUS | Status: AC
  Administered 2021-01-04: 1000 mL via INTRAVENOUS

## 2021-01-04 MED ORDER — SODIUM CHLORIDE 0.9 % IV SOLN
1.0000 g | Freq: Once | INTRAVENOUS | Status: AC
  Administered 2021-01-04: 1 g via INTRAVENOUS
  Filled 2021-01-04: qty 10

## 2021-01-04 MED ORDER — POTASSIUM & SODIUM PHOSPHATES 280-160-250 MG PO PACK
1.0000 | PACK | Freq: Three times a day (TID) | ORAL | Status: DC
  Filled 2021-01-04 (×2): qty 1

## 2021-01-04 MED ORDER — IOHEXOL 300 MG/ML  SOLN
75.0000 mL | Freq: Once | INTRAMUSCULAR | Status: AC | PRN
  Administered 2021-01-04: 75 mL via INTRAVENOUS

## 2021-01-04 MED ORDER — POTASSIUM CITRATE-CITRIC ACID 1100-334 MG/5ML PO SOLN
40.0000 meq | Freq: Once | ORAL | Status: DC
  Filled 2021-01-04: qty 20

## 2021-01-04 MED ORDER — SODIUM CHLORIDE 0.9 % IV SOLN
200.0000 mg | Freq: Once | INTRAVENOUS | Status: DC
  Filled 2021-01-04: qty 40

## 2021-01-04 MED ORDER — LORAZEPAM 1 MG PO TABS
1.0000 mg | ORAL_TABLET | ORAL | Status: AC | PRN
  Administered 2021-01-06: 1 mg via ORAL
  Filled 2021-01-04: qty 1

## 2021-01-04 MED ORDER — SODIUM CHLORIDE 0.9 % IV SOLN
1.0000 g | INTRAVENOUS | Status: DC
  Administered 2021-01-05: 1 g via INTRAVENOUS
  Filled 2021-01-04: qty 10

## 2021-01-04 MED ORDER — SODIUM CHLORIDE 0.9 % IV SOLN: 500.0000 mg | Freq: Once | INTRAVENOUS | Status: DC

## 2021-01-04 MED ORDER — LORAZEPAM 2 MG/ML IJ SOLN
1.0000 mg | INTRAMUSCULAR | Status: AC | PRN
Start: 2021-01-04 — End: 2021-01-07

## 2021-01-04 MED ORDER — THIAMINE HCL 100 MG/ML IJ SOLN
100.0000 mg | Freq: Every day | INTRAMUSCULAR | Status: DC
  Administered 2021-01-04: 100 mg via INTRAVENOUS
  Filled 2021-01-04 (×2): qty 2

## 2021-01-04 MED ORDER — MIDODRINE HCL 5 MG PO TABS
5.0000 mg | ORAL_TABLET | Freq: Two times a day (BID) | ORAL | Status: DC
  Administered 2021-01-04 – 2021-01-09 (×10): 5 mg via ORAL
  Filled 2021-01-04 (×13): qty 1

## 2021-01-04 MED ORDER — DEXAMETHASONE SODIUM PHOSPHATE 10 MG/ML IJ SOLN
6.0000 mg | INTRAMUSCULAR | Status: DC
  Administered 2021-01-05 – 2021-01-09 (×5): 6 mg via INTRAVENOUS
  Filled 2021-01-04 (×5): qty 1

## 2021-01-04 NOTE — ED Notes (Signed)
Date and time results received: 01/04/21 2023 (use smartphrase ".now" to insert current time)  Test: sodium Critical Value: 115  Name of Provider Notified: Cox, MD  Orders Received? Or Actions Taken?: Orders Received - See Orders for details

## 2021-01-04 NOTE — ED Provider Notes (Signed)
[facility] Emergency Department Provider Note   ____________________________________________   I have reviewed the triage vital signs and the nursing notes.   HISTORY  Chief Complaint Weakness   History limited by: Not Limited   HPI [name] is a 45 y.o. female who presents to the emergency department today because of concerns for weakness.  The patient states that she has been having increasing problems with her balance over the past few months.  Over the past few days however she noticed increasing difficulty with ambulation until today when she cannot ambulate at all.  She says that she has had similar symptoms happened in the past when her electrolytes are off.  She states that she is a frequent alcohol user.  She does state however that she tries to eat a good diet.  The patient denies any headaches.  She denies any fevers.   Records reviewed. Per medical record review patient has a history of alcohol abuse.  Past Medical History:  Diagnosis Date  . Abnormal Pap smear    Age 52  . 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 Alcohol abuse   . Anxiety   . Bacterial infection   . Cervical intraepithelial neoplasia III   . 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 Condyloma 2011  . H/O fatigue 2009  . H/O varicella   . Headache(784.0)   . HPV (human papilloma virus) anogenital infection   . Hx of dizziness 09/20/2011  . Hx: UTI (urinary tract infection)    Back pain  . Irregular menstrual cycle   . IV drug user   . Palpitations 2010  . Pelvic pain in female 12/05/11  . 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    Age 70  . Trichomonas   . Yeast infection     Patient Active Problem List   Diagnosis Date Noted  . Edema of right lower leg 05/28/2020  . Abnormal laboratory test result 02/14/2020  . History of allergy 01/16/2020  . Essential hypertension 01/16/2020  . Smoking 01/16/2020  . Health  care maintenance 01/08/2020  . Alcohol abuse   . Alcohol withdrawal delirium ([place]) 07/17/2015  . Ascites 07/21/2014  . Alcohol withdrawal ([place]) 07/09/2014  . Alcoholism /alcohol abuse ([place]) 07/09/2014  . Abnormal computed tomography angiography (CTA) of abdomen and pelvis 07/09/2014  . Malnutrition of moderate degree ([place]) 07/09/2014  . Liver function study, abnormal 04/08/2014  . ETOH abuse 04/08/2014  . Palpitations - evaluated by Dr. Cathie Olden in the past 02/20/2014  . Anxiety 04/02/2012  . Drug dependence ([place]) 03/18/2012  . 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    Past Surgical History:  Procedure Laterality Date  . A*wisdom teeth ext    . CERVICAL CONIZATION W/BX  11/30/2011   Procedure: CONIZATION CERVIX WITH BIOPSY;  Surgeon: [name], MD;  Location: [facility];  Service: Gynecology;  Laterality: N/A;  . CERVICAL CONIZATION W/BX  03/16/2012   Procedure: CONIZATION CERVIX WITH BIOPSY;  Surgeon: Ena Dawley, MD;  Location: [facility];  Service: Gynecology;  Laterality: N/A;  . CONIZATION CERVIX     x 3  . DILATION AND CURETTAGE OF UTERUS  11/30/2011   Procedure: DILATATION AND CURETTAGE;  Surgeon: [name], MD;  Location: [facility];  Service: Gynecology;  Laterality: N/A;  . svd     x 1    Prior to Admission medications   Medication Sig Start Date End Date Taking? Authorizing  Provider  Multiple Vitamin (MULTIVITAMIN WITH MINERALS) TABS tablet Take 1 tablet by mouth daily. 07/19/15   Aldean Jewett, MD  propranolol (INDERAL) 40 MG tablet TAKE 1 TABLET(40 MG) BY MOUTH EVERY MORNING 11/26/20   Iloabachie, [name] E, NP    Allergies Patient has no known allergies.  Family History  Problem Relation Age of Onset  . Cervical cancer Mother   . Breast cancer Mother   . Cancer Mother 66       Breast & cervical   . Stroke Maternal Grandmother   . COPD Maternal Grandfather        Emphysema  . Hypertension Paternal Grandfather   . Stroke Paternal Grandfather    . Cancer Father 48       prostate    Social History Social History   Tobacco Use  . Smoking status: Current Every Day Smoker    Years: 30.00    Types: Cigarettes  . Smokeless tobacco: Former Systems developer  . Tobacco comment: less than 8 a day  Vaping Use  . Vaping Use: Never used  Substance Use Topics  . Alcohol use: Yes    Alcohol/week: 8.0 - 10.0 standard drinks    Types: 8 - 10 Cans of beer per week    Comment: every day after work   . Drug use: Not Currently    Types: Heroin    Comment: Abused rx drugs - on methadone 08/2011    Review of Systems Constitutional: No fever/chills Eyes: No visual changes. ENT: No sore throat. Cardiovascular: Denies chest pain. Respiratory: Denies shortness of breath. Gastrointestinal: No abdominal pain.  No nausea, no vomiting.  No diarrhea.   Genitourinary: Negative for dysuria. Musculoskeletal: Negative for back pain. Skin: Negative for rash. Neurological: Positive for imbalance, difficulty with walking.  ____________________________________________   PHYSICAL EXAM:  VITAL SIGNS: ED Triage Vitals [01/04/21 1341]  Enc Vitals Group     BP (!) 90/47     Pulse Rate 79     Resp 18     Temp 97.7 F (36.5 C)     Temp Source Oral     SpO2 94 %     Weight 145 lb (65.8 kg)     Height 5' (1.524 m)     Head Circumference      Peak Flow      Pain Score 0   Constitutional: Alert and oriented.  Eyes: Conjunctivae are normal.  ENT      Head: Normocephalic and atraumatic.      Nose: No congestion/rhinnorhea.      Mouth/Throat: Mucous membranes are moist.      Neck: No stridor. Hematological/Lymphatic/Immunilogical: No cervical lymphadenopathy. Cardiovascular: Normal rate, regular rhythm.  No murmurs, rubs, or gallops.  Respiratory: Normal respiratory effort without tachypnea nor retractions. Breath sounds are clear and equal bilaterally. No wheezes/rales/rhonchi. Gastrointestinal: Soft and non tender. No rebound. No guarding.   Genitourinary: Deferred Musculoskeletal: Normal range of motion in all extremities. No lower extremity edema. Neurologic:  Normal speech and language. No gross focal neurologic deficits are appreciated.  Skin:  Skin is warm, dry and intact. No rash noted. Psychiatric: Mood and affect are normal. Speech and behavior are normal. Patient exhibits appropriate insight and judgment.  ____________________________________________    LABS (pertinent positives/negatives)  CMP na 113, k 2.9, glu 94, cr 1.28 CBC wbc 23.7, hgb 9.7, plt 229  ____________________________________________   EKG  I, Nance Pear, attending physician, personally viewed and interpreted this EKG  EKG Time: 1407 Rate:  79 Rhythm: normal sinus rhythm Axis: normal Intervals: qtc 493 QRS: narrow, q waves v1 III ST changes: no st elevation Impression: abnormal ekg  ____________________________________________    RADIOLOGY  CT head Mild atrophy for age. No mass, hemorrhage appreciated.  CXR Right sided pleural effusion with possible consolidation ____________________________________________   PROCEDURES  Procedures  CRITICAL CARE Performed by: Nance Pear   Total critical care time: 35 minutes  Critical care time was exclusive of separately billable procedures and treating other patients.  Critical care was necessary to treat or prevent imminent or life-threatening deterioration.  Critical care was time spent personally by me on the following activities: development of treatment plan with patient and/or surrogate as well as nursing, discussions with consultants, evaluation of patient's response to treatment, examination of patient, obtaining history from patient or surrogate, ordering and performing treatments and interventions, ordering and review of laboratory studies, ordering and review of radiographic studies, pulse oximetry and re-evaluation of patient's  condition.  ____________________________________________   INITIAL IMPRESSION / ASSESSMENT AND PLAN / ED COURSE  Pertinent labs & imaging results that were available during my care of the patient were reviewed by me and considered in my medical decision making (see chart for details).    Patient presented to the emergency department today because of concerns for weakness.  On exam patient was found to have slightly low oxygenation status as well as some hypotension.  Blood work was concerning for hyponatremia with other electrolyte abnormalities as well as leukocytosis.  Did repeat chemistry given very low sodium level however repeat chemistry was essentially the same.  I do think this is a true finding.  Patient was started on IV fluids.  Additionally x-ray was performed of the chest given relative hypoxia and was concerning for a large right pleural effusion.  This was better characterized with a CT scan.  Patient was started on IV antibiotics for possible pneumonia.  Additionally she did test positive for COVID.  Discussed these findings with the patient.  Will plan on admission. ____________________________________________   FINAL CLINICAL IMPRESSION(S) / ED DIAGNOSES  Final diagnoses:  Weakness  Hyponatremia  Hypoxia  Pleural effusion  Pneumonia due to infectious organism, unspecified laterality, unspecified part of lung  COVID-19     Note: This dictation was prepared with Dragon dictation. Any transcriptional errors that result from this process are unintentional     Nance Pear, MD 01/04/21 [phone]

## 2021-01-04 NOTE — ED Triage Notes (Signed)
Patient to ER from home via [facility] for c/o weakness to right side since Saturday at 1100. Patient states she ran out of Propanolol on same day as s/s beginning. Patient stated to EMS that upper body began drifting to right side, particularly her head, on Saturday. Patient is not on blood thinners. Has slurred speech sound when speaking, but reports to EMS that she has a speech impediment. Patient and significant other at home verify this is patient's baseline for speech. Patient's vitals per EMS: 102/64, 81 HR, 96%RA, A&Ox4.

## 2021-01-04 NOTE — H&P (Signed)
History and Physical   [name] IRJ:[id number] DOB: 07-06-1976 DOA: 01/04/2021  PCP: Langston Reusing, NP  Patient coming from: home  I have personally briefly reviewed patient's old medical records in [place].  Chief Concern: Weakness  HPI: [name] is a 45 y.o. female with medical history significant for palpitations on propanolol, daily alcohol abuse, atopic dermatitis, history of cervical cancer, presents emergency department for chief concerns of weakness and difficulty with balance.  She reports she has been feeling weak for about 2 weeks. She denies changes to diet. She endorses a new cough for 1 week. She states she is experiencing shortness of breath both at rest and with activity.  She denies fever, chest pain, abdominal pain, dysuria, hematuria.  She endorses myalgia.  She reports she has never felt this way before.  She endorses feeling fatigue and dizziness.  She states that she consumes approximately 8-10 beers per day.  She endorses tremors when she does not drink EtOH.  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 She lives at home with friends. She smokes tobacco, 3-4 cigarettes per day now, formerly 1 ppd. She denies recreational drug use.   ROS: Constitutional: no weight change, no fever ENT/Mouth: + sore throat, no rhinorrhea Eyes: no eye pain, no vision changes Cardiovascular: no chest pain, + dyspnea,  no edema, no palpitations Respiratory: + cough, + sputum (yellow), no wheezing Gastrointestinal: no nausea, no vomiting, no diarrhea, no constipation Genitourinary: no urinary incontinence, no dysuria, no hematuria Musculoskeletal: no arthralgias, + myalgias Skin: no skin lesions, no pruritus, Neuro: + weakness, no loss of consciousness, no syncope Psych: no anxiety, no depression, + decrease appetite Heme/Lymph: no bruising, no bleeding  ED Course: Discussed with  ED provider, patient requiring hospitalization due to complete collapse of right middle lobe and COVID-19 infection and electrolyte imbalance  Assessment/Plan  Active Problems:   Acute hypoxemic respiratory failure due to COVID-19 [facility])   Acute hypoxemic respiratory failure secondary to COVID-19 infection - IV remdesivir per pharmacy, IV dexamethasone initiated  - Superimposed bacterial infection coverage with ceftriaxone 1 g daily IV and azithromycin 500 mg IV daily started - Incentive spirometry and flutter valve for 10 reps every 2 hours while awake - Albuterol inhaler 2 puffs every 4 hours while awake, budesonide 2 puffs inhaler twice daily - Daily labs: CMP, CBC, CRP, D-dimer - Supplemental oxygen to maintain SPO2 goal of greater than 88% - Airborne and contact precautions  Sepsis has not been ruled out  - Blood cultures x2 - UA has been ordered - Checking Legionella antigen via urine  Collapse of right middle lobe in the long - Per radiology, recommended repeat CT chest with IV contrast for evaluation of resolution and to exclude underlying malignancy - Admit to stepdown  Electrolyte imbalance including hyponatremia, hypokalemia, hypochloridemia Moderate-severe hyponatremia Acute hyponatremia on chronic mild hyponatremia -Suspect secondary to beer potomania -Status post 1 L LR per ED provider - Checking urine osmolality and urine sodium - BMP recheck including now and every 4 hours  Acute kidney injury- suspect prerenal in setting of chronic daily alcohol use, however as patient has tested positive for COVID-19, inflammatory changes from COVID-19 cannot be excluded - Baseline serum creatinine is 0.5 - Serum creatinine on presentation is 1.28 - LR 1 L bolus - BMP as above  Hypotension- query normal only patient is low normootensive  - Midodrine 5 mg twice daily, including now -  MAP is maintaining at greater than 65 -Avoiding aggressive fluid resuscitation due to severe  hyponatremia  Alcohol abuse-counseling has been given, patient is aware that she needs to quit and is ready to quit -She endorses history of alcohol tremors when she does not get alcohol - CIWA protocol initiated  Tobacco abuse-patient is ready to quit and is making good progress as she now smokes 3 to 4 cigarettes/day from 1 pack/day - Nicotine patch has been ordered  Protein malnutrition-suspect secondary to chronic daily alcohol abuse - Dietary has been consulted  Chart reviewed.   DVT prophylaxis: Enoxaparin Code Status: Full code Diet: Heart healthy Family Communication: Patient states family is aware Disposition Plan: Guarded prognosis Consults called: None at this time Admission status: Inpatient to stepdown  Past Medical History:  Diagnosis Date  . Abnormal Pap smear    Age 34  . 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 Alcohol abuse   . Anxiety   . Bacterial infection   . Cervical intraepithelial neoplasia III   . 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 Condyloma 2011  . H/O fatigue 2009  . H/O varicella   . Headache(784.0)   . HPV (human papilloma virus) anogenital infection   . Hx of dizziness 09/20/2011  . Hx: UTI (urinary tract infection)    Back pain  . Irregular menstrual cycle   . IV drug user   . Palpitations 2010  . Pelvic pain in female 12/05/11  . 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    Age 30  . Trichomonas   . Yeast infection    Past Surgical History:  Procedure Laterality Date  . A*wisdom teeth ext    . CERVICAL CONIZATION W/BX  11/30/2011   Procedure: CONIZATION CERVIX WITH BIOPSY;  Surgeon: [name], MD;  Location: [facility];  Service: Gynecology;  Laterality: N/A;  . CERVICAL CONIZATION W/BX  03/16/2012   Procedure: CONIZATION CERVIX WITH BIOPSY;  Surgeon: Ena Dawley, MD;  Location: [facility];  Service: Gynecology;  Laterality: N/A;  . CONIZATION CERVIX     x 3  . DILATION AND  CURETTAGE OF UTERUS  11/30/2011   Procedure: DILATATION AND CURETTAGE;  Surgeon: [name], MD;  Location: [facility];  Service: Gynecology;  Laterality: N/A;  . svd     x 1   Social History:  reports that she has been smoking cigarettes. She has smoked for the past 30.00 years. She has quit using smokeless tobacco. She reports current alcohol use of about 8.0 - 10.0 standard drinks of alcohol per week. She reports previous drug use. Drug: Heroin.  No Known Allergies Family History  Problem Relation Age of Onset  . Cervical cancer Mother   . Breast cancer Mother   . Cancer Mother 99       Breast & cervical   . Stroke Maternal Grandmother   . COPD Maternal Grandfather        Emphysema  . Hypertension Paternal Grandfather   . Stroke Paternal Grandfather   . Cancer Father 48       prostate   Family history: Family history reviewed and not pertinent  Prior to Admission medications   Medication Sig Start Date End Date Taking? Authorizing Provider  Multiple Vitamin (MULTIVITAMIN WITH MINERALS) TABS tablet Take 1 tablet by mouth daily. 07/19/15   Aldean Jewett, MD  propranolol (INDERAL) 40 MG tablet TAKE 1 TABLET(40 MG) BY MOUTH EVERY MORNING 11/26/20  Iloabachie, [name] E, NP   Physical Exam: Vitals:   01/04/21 1341 01/04/21 1600  BP: (!) 90/47 (!) 88/53  Pulse: 79 78  Resp: 18 20  Temp: 97.7 F (36.5 C)   TempSrc: Oral   SpO2: 94% 92%  Weight: 65.8 kg   Height: 5' (1.524 m)    Constitutional: appears older than chronological age, frail, unkept hair, NAD, calm, comfortable Eyes: PERRL, lids and conjunctivae normal ENMT: Mucous membranes are moist. Posterior pharynx clear of any exudate or lesions. Age-appropriate dentition. Hearing appropriate Neck: normal, supple, no masses, no thyromegaly Respiratory: clear to auscultation bilaterally, no wheezing, no crackles. Normal respiratory effort. No accessory muscle use.  Cardiovascular: Regular rate and rhythm, no murmurs /  rubs / gallops. No extremity edema. 2+ pedal pulses. No carotid bruits.  Abdomen: no tenderness, no masses palpated, no hepatosplenomegaly. Bowel sounds positive.  Musculoskeletal: no clubbing / cyanosis. No joint deformity upper and lower extremities. Good ROM, no contractures, no atrophy. Normal muscle tone.  Skin: no rashes, lesions, ulcers. No induration Neurologic: Sensation intact. Strength 5/5 in all 4.  Psychiatric: Normal judgment and insight. Alert and oriented x 3. Normal mood.   EKG: independently reviewed, showing sinus rhythm with a rate of 79, QTc 493  Chest x-ray on Admission: I personally reviewed and I agree with radiologist reading as below.  CT HEAD WO CONTRAST  Result Date: 01/04/2021 CLINICAL DATA:  Right-sided weakness EXAM: CT HEAD WITHOUT CONTRAST TECHNIQUE: Contiguous axial images were obtained from the base of the skull through the vertex without intravenous contrast. COMPARISON:  None. FINDINGS: Brain: There is mild diffuse atrophy for age. Prominence of the cisterna magna is an anatomic variant. There is no intracranial mass, hemorrhage, extra-axial fluid collection, or midline shift. Brain parenchyma appears unremarkable; no acute infarct is demonstrable. Vascular: No hyperdense vessel. No appreciable vascular calcification. Skull: Bony calvarium appears intact. Sinuses/Orbits: Visualized paranasal sinuses are clear. Visualized orbits appear symmetric bilaterally. Other: Mastoid air cells are clear. IMPRESSION: Mild atrophy for age. Brain parenchyma appears unremarkable. No mass or hemorrhage. Electronically Signed   By: Bretta BangWilliam  Woodruff III M.D.   On: 01/04/2021 14:05   CT Chest W Contrast  Result Date: 01/04/2021 CLINICAL DATA:  Right-sided pleural effusion, shortness of breath, leukocytosis. EXAM: CT CHEST WITH CONTRAST TECHNIQUE: Multidetector CT imaging of the chest was performed during intravenous contrast administration. CONTRAST:  75mL OMNIPAQUE IOHEXOL 300  MG/ML  SOLN COMPARISON:  Chest x-ray 01/04/2021, CT abdomen pelvis 08/15/2018. FINDINGS: Cardiovascular: Normal heart size. No significant pericardial effusion. The thoracic aorta is normal in caliber. No atherosclerotic plaque of the thoracic aorta. No coronary artery calcifications. Mediastinum/Nodes: Prominent infra cardiac lymph nodes measuring up to 0.6 cm (2:110). No enlarged mediastinal, hilar, or axillary lymph nodes. Thyroid gland, trachea, and esophagus demonstrate no significant findings. Lungs/Pleura: Complete collapse of the right middle lobe and partial collapse of the right lower lobe. Within the collapsed right middle lobe, several rounded hypodensities are noted (2:90). No cavitary lesion identified. No associated surrounding enhancing/thick-walled capsule. Nonspecific calcification within the collapsed right lower lobe. Passive atelectasis of the right upper lobe. Couple scattered peribronchovascular ground-glass airspace opacities within the left upper lobe. Suggestion of round atelectasis at the left base (2:123). Small to moderate volume right pleural effusion. No definite pleural thickening or enhancement to suggest empyema. No pleural effusion on the left. No pneumothorax bilaterally. Upper Abdomen: The hepatic parenchyma is diffusely hypodense compared to the splenic parenchyma consistent with fatty infiltration. Otherwise no acute abnormality. Musculoskeletal:  No chest wall abnormality. No acute or significant osseous findings. IMPRESSION: 1. Small to moderate right pleural effusion with associated complete collapse of right middle lobe and partial collapse of right lower lobe. Several rounded hypodensities within the collapsed right middle lobe likely represents infection/inflammation with no definite findings to suggest abscess formation. Recommend short-term repeat CT chest with intravenous contrast for evaluation of resolution and exclude underlying malignancy. 2. Few scattered  ground-glass airspace opacities within the of the left lower lobe could represent atelectasis versus infection/inflammation. Electronically Signed   By: Iven Finn M.D.   On: 01/04/2021 17:28   DG Chest Portable 1 View  Result Date: 01/04/2021 CLINICAL DATA:  Weakness EXAM: PORTABLE CHEST 1 VIEW COMPARISON:  11/17/2014 FINDINGS: Minimal airspace disease at the peripheral left lung base. At least moderate size right pleural effusion which appears loculated. Consolidation at the right middle lobe and right base. Cardiomediastinal silhouette is obscured. No pneumothorax. IMPRESSION: 1. At least moderate size right pleural effusion which appears loculated. Consolidation at the right middle lobe and right base may reflect atelectasis or pneumonia. 2. Minimal airspace disease at the peripheral left lung base. Electronically Signed   By: Donavan Foil M.D.   On: 01/04/2021 16:20   Labs on Admission: I have personally reviewed following labs  CBC: Recent Labs  Lab 01/04/21 1414  WBC 23.7*  NEUTROABS 21.2*  HGB 9.7*  HCT 26.5*  MCV 91.1  PLT [id number]   Basic Metabolic Panel: Recent Labs  Lab 01/04/21 1414 01/04/21 1546  NA 113* 114*  K 2.9* 2.8*  CL 69* 69*  CO2 21* 24  GLUCOSE 94 97  BUN 30* 30*  CREATININE 1.28* 1.38*  CALCIUM 7.4* 7.4*   GFR: Estimated Creatinine Clearance: 43.1 mL/min (A) (by C-G formula based on SCr of 1.38 mg/dL (H)).  Liver Function Tests: Recent Labs  Lab 01/04/21 1414 01/04/21 1546  AST 145* 155*  ALT 27 31  ALKPHOS 96 102  BILITOT 4.0* 4.3*  PROT 6.4* 6.8  ALBUMIN 2.3* 2.3*   Coagulation Profile: Recent Labs  Lab 01/04/21 1441  INR 1.3*   Urine analysis:    Component Value Date/Time   COLORURINE STRAW (A) 08/15/2018 2021   APPEARANCEUR Clear 02/05/2020 1229   LABSPEC 1.002 (L) 08/15/2018 2021   PHURINE 6.0 08/15/2018 2021   GLUCOSEU Negative 02/05/2020 1229   HGBUR NEGATIVE 08/15/2018 2021   BILIRUBINUR Negative 02/05/2020 [place] 08/15/2018 2021   PROTEINUR Negative 02/05/2020 1229   PROTEINUR NEGATIVE 08/15/2018 2021   UROBILINOGEN 0.2 05/05/2015 2350   NITRITE Negative 02/05/2020 1229   NITRITE NEGATIVE 08/15/2018 2021   LEUKOCYTESUR Negative 02/05/2020 1229   CRITICAL CARE Performed by: Briant Cedar Brocha Gilliam  Total critical care time: 35 minutes  Critical care time was exclusive of separately billable procedures and treating other patients.  Critical care was necessary to treat or prevent imminent or life-threatening deterioration.  Critical care was time spent personally by me on the following activities: development of treatment plan with patient and/or surrogate as well as nursing, discussions with consultants, evaluation of patient's response to treatment, examination of patient, obtaining history from patient or surrogate, ordering and performing treatments and interventions, ordering and review of laboratory studies, ordering and review of radiographic studies, pulse oximetry and re-evaluation of patient's condition.  Daenerys Buttram N Eddie Koc D.O. [facility]  If 7PM-7AM, please contact overnight-coverage provider If 7AM-7PM, please contact day coverage provider [email or web address]  01/04/2021, 6:15 PM

## 2021-01-04 NOTE — ED Notes (Signed)
Resumed care from [place] rn.  Pt sleeping  Iv in place.  Pt moving about on stretcher.

## 2021-01-04 NOTE — ED Notes (Signed)
Date and time results received: 01/04/21 4:34 PM   Test: Lactic Acid  Critical Value: 2.6  Name of Provider Notified: Archie Balboa, MD   Orders Received? Or Actions Taken?: ED PRovider aware.

## 2021-01-04 NOTE — Consult Note (Signed)
Remdesivir - Pharmacy Brief Note   O:  ALT: 31 CXR: At least moderate size right pleural effusion which appears loculated. Consolidation at the right middle lobe and right base may reflect atelectasis or pneumonia.Minimal airspace disease at the peripheral left lung base SpO2: 86% on RA   A/P:  Remdesivir 200 mg IVPB once followed by 100 mg IVPB daily x 4 days.    Sherilyn Banker, PharmD Pharmacy Resident  01/04/2021 5:50 PM

## 2021-01-04 NOTE — ED Notes (Signed)
States that she came to ED for c/o altered gait. States yesterday she began noticing weakness and the inability to ambulate. Pt states that she has had this before when her NA level became low after drinking. Pt with extensive addiction history voiced to staff.

## 2021-01-04 NOTE — ED Notes (Signed)
Date and time results received: 01/04/21 2023 (use smartphrase ".now" to insert current time)  Test: potassium Critical Value: 2.6  Name of Provider Notified: Cox, MD  Orders Received? Or Actions Taken?: Orders Received - See Orders for details

## 2021-01-04 NOTE — ED Notes (Signed)
Called into hallway by family, states "Pt says that she is going home, but I don't think she can with as weak as she is". Went with family and patient and discussed with both parties that we discussed admission with patient, not discharge so all parties were involved in conversation and nothing could be misconstrued.

## 2021-01-04 NOTE — ED Notes (Signed)
Date and time results received: 01/04/21 1653   Test: COVID Critical Value: POS  Name of Provider Notified: Archie Balboa, MD   Orders Received? Or Actions Taken?: ED Provider aware

## 2021-01-04 NOTE — ED Notes (Signed)
Date and time results received: 01/04/21 4:33 PM   Test: Sodium 114  Critical Value: 114  Name of Provider Notified: Archie Balboa, MD   Orders Received? Or Actions Taken?: ED Provider made aware

## 2021-01-05 ENCOUNTER — Inpatient Hospital Stay: Payer: Self-pay

## 2021-01-05 DIAGNOSIS — F1010 Alcohol abuse, uncomplicated: Secondary | ICD-10-CM | POA: Diagnosis not present

## 2021-01-05 DIAGNOSIS — J690 Pneumonitis due to inhalation of food and vomit: Secondary | ICD-10-CM | POA: Diagnosis not present

## 2021-01-05 DIAGNOSIS — J9601 Acute respiratory failure with hypoxia: Secondary | ICD-10-CM | POA: Diagnosis not present

## 2021-01-05 DIAGNOSIS — U071 COVID-19: Secondary | ICD-10-CM | POA: Diagnosis not present

## 2021-01-05 LAB — BODY FLUID CELL COUNT WITH DIFFERENTIAL
Eos, Fluid: 0 %
Lymphs, Fluid: 2 %
Monocyte-Macrophage-Serous Fluid: 3 %
Neutrophil Count, Fluid: 95 %
Total Nucleated Cell Count, Fluid: 121858 cu mm

## 2021-01-05 LAB — BASIC METABOLIC PANEL
Anion gap: 20 — ABNORMAL HIGH (ref 5–15)
Anion gap: 19 — ABNORMAL HIGH (ref 5–15)
Anion gap: 16 — ABNORMAL HIGH (ref 5–15)
BUN: 25 mg/dL — ABNORMAL HIGH (ref 6–20)
BUN: 24 mg/dL — ABNORMAL HIGH (ref 6–20)
BUN: 20 mg/dL (ref 6–20)
CO2: 21 mmol/L — ABNORMAL LOW (ref 22–32)
CO2: 23 mmol/L (ref 22–32)
CO2: 24 mmol/L (ref 22–32)
Calcium: 7.4 mg/dL — ABNORMAL LOW (ref 8.9–10.3)
Calcium: 7.4 mg/dL — ABNORMAL LOW (ref 8.9–10.3)
Calcium: 9 mg/dL (ref 8.9–10.3)
Chloride: 80 mmol/L — ABNORMAL LOW (ref 98–111)
Chloride: 82 mmol/L — ABNORMAL LOW (ref 98–111)
Chloride: 87 mmol/L — ABNORMAL LOW (ref 98–111)
Creatinine, Ser: 1.08 mg/dL — ABNORMAL HIGH (ref 0.44–1.00)
Creatinine, Ser: 1 mg/dL (ref 0.44–1.00)
Creatinine, Ser: 0.81 mg/dL (ref 0.44–1.00)
GFR, Estimated: >60 mL/min (ref >60)
GFR, Estimated: >60 mL/min (ref >60)
GFR, Estimated: >60 mL/min (ref >60)
Glucose, Bld: 110 mg/dL — ABNORMAL HIGH (ref 70–99)
Glucose, Bld: 128 mg/dL — ABNORMAL HIGH (ref 70–99)
Glucose, Bld: 188 mg/dL — ABNORMAL HIGH (ref 70–99)
Potassium: 4.4 mmol/L (ref 3.5–5.1)
Potassium: 3.8 mmol/L (ref 3.5–5.1)
Potassium: 3.9 mmol/L (ref 3.5–5.1)
Sodium: 121 mmol/L — ABNORMAL LOW (ref 135–145)
Sodium: 124 mmol/L — ABNORMAL LOW (ref 135–145)
Sodium: 127 mmol/L — ABNORMAL LOW (ref 135–145)

## 2021-01-05 LAB — CBG MONITORING, ED: Glucose-Capillary: 186 mg/dL — ABNORMAL HIGH (ref 70–99)

## 2021-01-05 LAB — URINALYSIS, COMPLETE (UACMP) WITH MICROSCOPIC
Bilirubin Urine: NEGATIVE
Glucose, UA: NEGATIVE mg/dL
Ketones, ur: 20 mg/dL — AB
Nitrite: NEGATIVE
Protein, ur: NEGATIVE mg/dL
Specific Gravity, Urine: 1.015 (ref 1.005–1.030)
pH: 5 (ref 5.0–8.0)

## 2021-01-05 LAB — CBC WITH DIFFERENTIAL/PLATELET
Abs Immature Granulocytes: 1.22 10*3/uL — ABNORMAL HIGH (ref 0.00–0.07)
Basophils Absolute: 0.2 10*3/uL — ABNORMAL HIGH (ref 0.0–0.1)
Basophils Relative: 1 %
Eosinophils Absolute: 0 10*3/uL (ref 0.0–0.5)
Eosinophils Relative: 0 %
HCT: 25 % — ABNORMAL LOW (ref 36.0–46.0)
Hemoglobin: 9.1 g/dL — ABNORMAL LOW (ref 12.0–15.0)
Immature Granulocytes: 7 %
Lymphocytes Relative: 6 %
Lymphs Abs: 0.9 10*3/uL (ref 0.7–4.0)
MCH: 33.5 pg (ref 26.0–34.0)
MCHC: 36.4 g/dL — ABNORMAL HIGH (ref 30.0–36.0)
MCV: 91.9 fL (ref 80.0–100.0)
Monocytes Absolute: 0.5 10*3/uL (ref 0.1–1.0)
Monocytes Relative: 3 %
Neutro Abs: 13.9 10*3/uL — ABNORMAL HIGH (ref 1.7–7.7)
Neutrophils Relative %: 83 %
Platelets: 206 10*3/uL (ref 150–400)
RBC: 2.72 MIL/uL — ABNORMAL LOW (ref 3.87–5.11)
RDW: 13.2 % (ref 11.5–15.5)
Smear Review: NORMAL
WBC: 16.7 10*3/uL — ABNORMAL HIGH (ref 4.0–10.5)
nRBC: 0.4 % — ABNORMAL HIGH (ref 0.0–0.2)

## 2021-01-05 LAB — URINE DRUG SCREEN, QUALITATIVE (ARMC ONLY)
Amphetamines, Ur Screen: NOT DETECTED
Barbiturates, Ur Screen: NOT DETECTED
Benzodiazepine, Ur Scrn: NOT DETECTED
Cannabinoid 50 Ng, Ur ~~LOC~~: NOT DETECTED
Cocaine Metabolite,Ur ~~LOC~~: NOT DETECTED
MDMA (Ecstasy)Ur Screen: NOT DETECTED
Methadone Scn, Ur: NOT DETECTED
Opiate, Ur Screen: NOT DETECTED
Phencyclidine (PCP) Ur S: NOT DETECTED
Tricyclic, Ur Screen: NOT DETECTED

## 2021-01-05 LAB — GLUCOSE, PLEURAL OR PERITONEAL FLUID: Glucose, Fluid: <20 mg/dL

## 2021-01-05 LAB — LACTATE DEHYDROGENASE, PLEURAL OR PERITONEAL FLUID: LD, Fluid: 7298 U/L — ABNORMAL HIGH (ref 3–23)

## 2021-01-05 LAB — OSMOLALITY, URINE: Osmolality, Ur: 285 mOsm/kg — ABNORMAL LOW (ref 300–900)

## 2021-01-05 LAB — FIBRIN DERIVATIVES D-DIMER (ARMC ONLY): Fibrin derivatives D-dimer (ARMC): 1767.02 ng/mL (FEU) — ABNORMAL HIGH (ref 0.00–499.00)

## 2021-01-05 LAB — SODIUM, URINE, RANDOM: Sodium, Ur: <10 mmol/L

## 2021-01-05 LAB — HIV ANTIBODY (ROUTINE TESTING W REFLEX): HIV Screen 4th Generation wRfx: NONREACTIVE

## 2021-01-05 LAB — C-REACTIVE PROTEIN: CRP: 27.2 mg/dL — ABNORMAL HIGH (ref <1.0)

## 2021-01-05 MED ORDER — SODIUM CHLORIDE 0.9 % IV SOLN
3.0000 g | Freq: Four times a day (QID) | INTRAVENOUS | Status: DC
  Administered 2021-01-06 – 2021-01-09 (×13): 3 g via INTRAVENOUS
  Filled 2021-01-05 (×2): qty 3
  Filled 2021-01-05: qty 8
  Filled 2021-01-05: qty 3
  Filled 2021-01-05 (×3): qty 8
  Filled 2021-01-05: qty 3
  Filled 2021-01-05 (×3): qty 8
  Filled 2021-01-05: qty 3
  Filled 2021-01-05 (×6): qty 8

## 2021-01-05 MED ORDER — DEXTROSE 5 % IV SOLN: INTRAVENOUS | Status: DC

## 2021-01-05 MED ORDER — NICOTINE 21 MG/24HR TD PT24
21.0000 mg | MEDICATED_PATCH | Freq: Once | TRANSDERMAL | Status: AC
  Administered 2021-01-05: 21 mg via TRANSDERMAL
  Filled 2021-01-05: qty 1

## 2021-01-05 MED ORDER — CALCIUM GLUCONATE-NACL 2-0.675 GM/100ML-% IV SOLN
2.0000 g | Freq: Once | INTRAVENOUS | Status: AC
  Administered 2021-01-05: 2000 mg via INTRAVENOUS
  Filled 2021-01-05: qty 100

## 2021-01-05 NOTE — ED Notes (Signed)
Pt given meal tray at this time. Pt reminded to use call bell to make any and all needs known. Pt denies further needs at this time.

## 2021-01-05 NOTE — Consult Note (Signed)
PHARMACY CONSULT NOTE - FOLLOW UP  Pharmacy Consult for Electrolyte Monitoring and Replacement   Recent Labs: Potassium  Date Value  01/05/2021 3.8 mmol/L  07/21/2014 3.7 mEq/L   Magnesium (mg/dL)  Date Value  01/04/2021 1.4 (L)   Calcium (mg/dL)  Date Value  01/05/2021 7.4 (L)  07/21/2014 8.9   Albumin (g/dL)  Date Value  01/04/2021 2.3 (L)  02/05/2020 4.6  07/21/2014 3.2 (L)   Phosphorus (mg/dL)  Date Value  01/04/2021 3.2   Sodium  Date Value  01/05/2021 124 mmol/L (L)  02/05/2020 130 mmol/L (L)  07/21/2014 143 mEq/L     Assessment: 46yo Female with PMH of palpitations (on propranolol), daily EtOH abuse, atopic dermatitis, h/o cervical cancer presenting to ED with CC of weakness and difficulty with balance found to be Covid positive.  Pt Covid PNA with Mod Rt Pleural effusion requiring thoracentesis and Rt lung atelectasis w/ c/f possible aspiration PNA. On presentation, pt experiencing multiple electrolyte derangements. Pharmacy consulted for the management of electrolytes.  Na: 115>121>124 K: 2.6>4.4>3.8 Phos: 3.2 Mg: 1.4 Ca: 7.0>7.4 * ~8.0-8.4 (Corrected calcium for Albumin 2.3)  Goal of Therapy:  [place]ytes WNL  Plan:  Na: 016>010>932. Correcting appropriately slowly. K: Has returned WNL 2.6>3.8 after cumulative KCL 19meq IV and 80 meq PO on 1/17. No further replacement today, will address magnesium. Phos: 3.2 on admit. WNL and no new labs drawn. Mg: 1.4 on admit. Will add Mg/Phos to AM labs. Ca: 7.0>7.4. Calcium Gluconate 2g x1 * ~8.0-8.4 (Corrected calcium for Albumin 2.3)  Lorna Dibble ,PharmD Clinical Pharmacist 01/05/2021 11:32 AM

## 2021-01-05 NOTE — ED Notes (Signed)
Night shift RN informed this RN that pt urinated on floor last night around 0300. Night shift RN cleaned floor and pt, and reminded pt to use call bell to make needs known.

## 2021-01-05 NOTE — ED Notes (Signed)
Per Larene Beach RN, pt urinated on floor at this time. Larene Beach RN stated that she cleaned floor and pt and notified this RN at this time.

## 2021-01-05 NOTE — ED Notes (Signed)
Took over care of pt. Pt sleeping. Pt in NAD at this time. Awaiting further orders. Will continue to monitor.

## 2021-01-05 NOTE — Procedures (Signed)
Ultrasound-guided diagnostic and therapeutic right sided thoracentesis performed yielding 200 mililiters of thick dark yellow colored fluid. No immediate complications.   Diagnostic fluid was sent to the lab for further analysis. Follow-up chest x-ray pending. EBL is < 2 ml. Due to patient's inability to stay still and desaturation of oxygen the procedure was terminated.

## 2021-01-05 NOTE — Consult Note (Signed)
Pulmonary Medicine          Date: 01/05/2021,   MRN# [id number] [name] Jul 17, 1976     AdmissionWeight: 65.8 kg                 CurrentWeight: 65.8 kg   Referring physician: Dr Jimmye Norman  CHIEF COMPLAINT:   Atelectasis in the context of COVID-19 lower respiratory tract infection.   HISTORY OF PRESENT ILLNESS   As per admission h/p [name] is a 45 y.o. female with medical history significant for palpitations on propanolol, daily alcohol abuse, atopic dermatitis, history of cervical cancer, presents emergency department for chief concerns of weakness and difficulty with balance. She reports she has been feeling weak for about 2 weeks. She denies changes to diet. She endorses a new cough for 1 week. She states she is experiencing shortness of breath both at rest and with activity.  She denies fever, chest pain, abdominal pain, dysuria, hematuria.  She endorses myalgia.  She reports she has never felt this way before.  She endorses feeling fatigue and dizziness.  She states that she consumes approximately 8-10 beers per day.  She endorses tremors when she does not drink EtOH.  Blood work is with mild leukocytosis and chronic anemia.  BMP with chronic electrolyte abnormalities including hyponatremia, hypokalemia hypochloremia, hyperglycemia AKI.  Rapid PCR for COVID is +1-day ago on January 04, 2021.  CT chest with right lung compressive atelectasis secondary to moderate pleural effusion with pulmonary consultation placed for additional evaluation and management. Patient is in no distress and asks how long will she need to stay in hospital.  Met with attending physician after evaluation and reviewed short term care plan.     PAST MEDICAL HISTORY   Past Medical History:  Diagnosis Date  . Abnormal Pap smear    Age 23  . 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 Alcohol abuse   . Anxiety   . Bacterial infection   . Cervical intraepithelial  neoplasia III   . 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 Condyloma 2011  . H/O fatigue 2009  . H/O varicella   . Headache(784.0)   . HPV (human papilloma virus) anogenital infection   . Hx of dizziness 09/20/2011  . Hx: UTI (urinary tract infection)    Back pain  . Irregular menstrual cycle   . IV drug user   . Palpitations 2010  . Pelvic pain in female 12/05/11  . 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    Age 44  . Trichomonas   . Yeast infection      SURGICAL HISTORY   Past Surgical History:  Procedure Laterality Date  . A*wisdom teeth ext    . CERVICAL CONIZATION W/BX  11/30/2011   Procedure: CONIZATION CERVIX WITH BIOPSY;  Surgeon: [name], MD;  Location: [facility];  Service: Gynecology;  Laterality: N/A;  . CERVICAL CONIZATION W/BX  03/16/2012   Procedure: CONIZATION CERVIX WITH BIOPSY;  Surgeon: Ena Dawley, MD;  Location: [facility];  Service: Gynecology;  Laterality: N/A;  . CONIZATION CERVIX     x 3  . DILATION AND CURETTAGE OF UTERUS  11/30/2011   Procedure: DILATATION AND CURETTAGE;  Surgeon: [name], MD;  Location: [facility];  Service: Gynecology;  Laterality: N/A;  . svd     x 1     FAMILY HISTORY   Family History  Problem Relation Age of Onset  .  Cervical cancer Mother   . Breast cancer Mother   . Cancer Mother 66       Breast & cervical   . Stroke Maternal Grandmother   . COPD Maternal Grandfather        Emphysema  . Hypertension Paternal Grandfather   . Stroke Paternal Grandfather   . Cancer Father 33       prostate     SOCIAL HISTORY   Social History   Tobacco Use  . Smoking status: Current Every Day Smoker    Years: 30.00    Types: Cigarettes  . Smokeless tobacco: Former Systems developer  . Tobacco comment: less than 8 a day  Vaping Use  . Vaping Use: Never used  Substance Use Topics  . Alcohol use: Yes    Alcohol/week: 8.0 - 10.0 standard drinks    Types: 8 - 10 Cans of beer per week    Comment:  every day after work   . Drug use: Not Currently    Types: Heroin    Comment: Abused rx drugs - on methadone 08/2011     MEDICATIONS    Home Medication:  Current Outpatient Rx  . Order #: [id number] Class: Normal    Current Medication:  Current Facility-Administered Medications:  .  acetaminophen (TYLENOL) tablet 325 mg, 325 mg, Oral, Q6H PRN, Cox, Amy N, DO .  azithromycin (ZITHROMAX) 500 mg in sodium chloride 0.9 % 250 mL IVPB, 500 mg, Intravenous, Q24H, Cox, Amy N, DO, Stopped at 01/04/21 2334 .  cefTRIAXone (ROCEPHIN) 1 g in sodium chloride 0.9 % 100 mL IVPB, 1 g, Intravenous, Q24H, Cox, Amy N, DO, Stopped at 01/05/21 0954 .  citric acid-potassium citrate (POLYCITRA) 10 mEq/5 ml solution 40 mEq, 40 mEq, Oral, Once, Cox, Amy N, DO .  dexamethasone (DECADRON) injection 6 mg, 6 mg, Intravenous, Q24H, Cox, Amy N, DO, 6 mg at 01/05/21 0911 .  enoxaparin (LOVENOX) injection 40 mg, 40 mg, Subcutaneous, Q24H, Cox, Amy N, DO, 40 mg at 56/38/75 6433 .  folic acid (FOLVITE) tablet 1 mg, 1 mg, Oral, Daily, Cox, Amy N, DO, 1 mg at 01/05/21 0912 .  LORazepam (ATIVAN) tablet 1-4 mg, 1-4 mg, Oral, Q1H PRN **OR** LORazepam (ATIVAN) injection 1-4 mg, 1-4 mg, Intravenous, Q1H PRN, Cox, Amy N, DO .  midodrine (PROAMATINE) tablet 5 mg, 5 mg, Oral, BID WC, Cox, Amy N, DO, 5 mg at 01/05/21 0913 .  multivitamin with minerals tablet 1 tablet, 1 tablet, Oral, Daily, Cox, Amy N, DO, 1 tablet at 01/05/21 0911 .  nicotine (NICODERM CQ - dosed in mg/24 hours) patch 21 mg, 21 mg, Transdermal, Once, Nance Pear, MD, 21 mg at 01/04/21 1639 .  ondansetron (ZOFRAN) tablet 4 mg, 4 mg, Oral, Q6H PRN **OR** ondansetron (ZOFRAN) injection 4 mg, 4 mg, Intravenous, Q6H PRN, Cox, Amy N, DO .  [COMPLETED] remdesivir 200 mg in sodium chloride 0.9% 250 mL IVPB, 200 mg, Intravenous, Once, Stopped at 01/04/21 2021 **FOLLOWED BY** remdesivir 100 mg in sodium chloride 0.9 % 100 mL IVPB, 100 mg, Intravenous, Daily, Cox, Amy N, DO,  Last Rate: 200 mL/hr at 01/05/21 0955, 100 mg at 01/05/21 0955 .  thiamine tablet 100 mg, 100 mg, Oral, Daily, 100 mg at 01/05/21 0911 **OR** thiamine (B-1) injection 100 mg, 100 mg, Intravenous, Daily, Cox, Amy N, DO, 100 mg at 01/04/21 2106  Current Outpatient Medications:  .  propranolol (INDERAL) 40 MG tablet, TAKE 1 TABLET(40 MG) BY MOUTH EVERY MORNING, Disp: 14 tablet, Rfl: 0  ALLERGIES   Patient has no known allergies.     REVIEW OF SYSTEMS    Review of Systems:  Gen:  Denies  fever, sweats, chills weigh loss  HEENT: Denies blurred vision, double vision, ear pain, eye pain, hearing loss, nose bleeds, sore throat Cardiac:  No dizziness, chest pain or heaviness, chest tightness,edema Resp:   Denies cough or sputum porduction, shortness of breath,wheezing, hemoptysis,  Gi: Denies swallowing difficulty, stomach pain, nausea or vomiting, diarrhea, constipation, bowel incontinence Gu:  Denies bladder incontinence, burning urine Ext:   Denies Joint pain, stiffness or swelling Skin: Denies  skin rash, easy bruising or bleeding or hives Endoc:  Denies polyuria, polydipsia , polyphagia or weight change Psych:   Denies depression, insomnia or hallucinations   Other:  All other systems negative   VS: BP (!) 83/71   Pulse 67   Temp 97.7 F (36.5 C) (Oral)   Resp 19   Ht 5' (1.524 m)   Wt 65.8 kg   SpO2 93%   BMI 28.32 kg/m      PHYSICAL EXAM    GENERAL:NAD, no fevers, chills, no weakness no fatigue HEAD: Normocephalic, atraumatic.  EYES: Pupils equal, round, reactive to light. Extraocular muscles intact. No scleral icterus.  MOUTH: Moist mucosal membrane. Dentition intact. No abscess noted.  EAR, NOSE, THROAT: Clear without exudates. No external lesions.  NECK: Supple. No thyromegaly. No nodules. No JVD.  PULMONARY: Diffuse coarse rhonchi right sided +wheezes CARDIOVASCULAR: S1 and S2. Regular rate and rhythm. No murmurs, rubs, or gallops. No edema. Pedal pulses  2+ bilaterally.  GASTROINTESTINAL: Soft, nontender, nondistended. No masses. Positive bowel sounds. No hepatosplenomegaly.  MUSCULOSKELETAL: No swelling, clubbing, or edema. Range of motion full in all extremities.  NEUROLOGIC: Cranial nerves II through XII are intact. No gross focal neurological deficits. Sensation intact. Reflexes intact.  SKIN: No ulceration, lesions, rashes, or cyanosis. Skin warm and dry. Turgor intact.  PSYCHIATRIC: Mood, affect within normal limits. The patient is awake, alert and oriented x 3. Insight, judgment intact.       IMAGING    CT HEAD WO CONTRAST  Result Date: 01/04/2021 CLINICAL DATA:  Right-sided weakness EXAM: CT HEAD WITHOUT CONTRAST TECHNIQUE: Contiguous axial images were obtained from the base of the skull through the vertex without intravenous contrast. COMPARISON:  None. FINDINGS: Brain: There is mild diffuse atrophy for age. Prominence of the cisterna magna is an anatomic variant. There is no intracranial mass, hemorrhage, extra-axial fluid collection, or midline shift. Brain parenchyma appears unremarkable; no acute infarct is demonstrable. Vascular: No hyperdense vessel. No appreciable vascular calcification. Skull: Bony calvarium appears intact. Sinuses/Orbits: Visualized paranasal sinuses are clear. Visualized orbits appear symmetric bilaterally. Other: Mastoid air cells are clear. IMPRESSION: Mild atrophy for age. Brain parenchyma appears unremarkable. No mass or hemorrhage. Electronically Signed   By: Lowella Grip III M.D.   On: 01/04/2021 14:05   CT Chest W Contrast  Result Date: 01/04/2021 CLINICAL DATA:  Right-sided pleural effusion, shortness of breath, leukocytosis. EXAM: CT CHEST WITH CONTRAST TECHNIQUE: Multidetector CT imaging of the chest was performed during intravenous contrast administration. CONTRAST:  48m OMNIPAQUE IOHEXOL 300 MG/ML  SOLN COMPARISON:  Chest x-ray 01/04/2021, CT abdomen pelvis 08/15/2018. FINDINGS: Cardiovascular:  Normal heart size. No significant pericardial effusion. The thoracic aorta is normal in caliber. No atherosclerotic plaque of the thoracic aorta. No coronary artery calcifications. Mediastinum/Nodes: Prominent infra cardiac lymph nodes measuring up to 0.6 cm (2:110). No enlarged mediastinal, hilar, or axillary lymph nodes. Thyroid gland,  trachea, and esophagus demonstrate no significant findings. Lungs/Pleura: Complete collapse of the right middle lobe and partial collapse of the right lower lobe. Within the collapsed right middle lobe, several rounded hypodensities are noted (2:90). No cavitary lesion identified. No associated surrounding enhancing/thick-walled capsule. Nonspecific calcification within the collapsed right lower lobe. Passive atelectasis of the right upper lobe. Couple scattered peribronchovascular ground-glass airspace opacities within the left upper lobe. Suggestion of round atelectasis at the left base (2:123). Small to moderate volume right pleural effusion. No definite pleural thickening or enhancement to suggest empyema. No pleural effusion on the left. No pneumothorax bilaterally. Upper Abdomen: The hepatic parenchyma is diffusely hypodense compared to the splenic parenchyma consistent with fatty infiltration. Otherwise no acute abnormality. Musculoskeletal: No chest wall abnormality. No acute or significant osseous findings. IMPRESSION: 1. Small to moderate right pleural effusion with associated complete collapse of right middle lobe and partial collapse of right lower lobe. Several rounded hypodensities within the collapsed right middle lobe likely represents infection/inflammation with no definite findings to suggest abscess formation. Recommend short-term repeat CT chest with intravenous contrast for evaluation of resolution and exclude underlying malignancy. 2. Few scattered ground-glass airspace opacities within the of the left lower lobe could represent atelectasis versus  infection/inflammation. Electronically Signed   By: Iven Finn M.D.   On: 01/04/2021 17:28   DG Chest Portable 1 View  Result Date: 01/04/2021 CLINICAL DATA:  Weakness EXAM: PORTABLE CHEST 1 VIEW COMPARISON:  11/17/2014 FINDINGS: Minimal airspace disease at the peripheral left lung base. At least moderate size right pleural effusion which appears loculated. Consolidation at the right middle lobe and right base. Cardiomediastinal silhouette is obscured. No pneumothorax. IMPRESSION: 1. At least moderate size right pleural effusion which appears loculated. Consolidation at the right middle lobe and right base may reflect atelectasis or pneumonia. 2. Minimal airspace disease at the peripheral left lung base. Electronically Signed   By: Donavan Foil M.D.   On: 01/04/2021 16:20      ASSESSMENT/PLAN   Acute hypoxemic respiratory failure due to COVID-19 lower respiratory tract infection -Remdesevir antiviral - pharmacy protocol 5 d -vitamin C -zinc -decadron 69m IV daily  -Self prone if patient can tolerate  -encourage to use IS and Acapella device for bronchopulmonary hygiene when able -consider Actemra if CRP increments >20 -d/c hepatotoxic medications while on remdesevir -supportive care with ICU telemetry monitoring -PT/OT when possible -procalcitonin, CRP and ferritin trending   Right moderate pleural effusion  - -UK[place]aguided thoracentesis - with fluid studies - r/o empyema vs parapneumonic effusion vs malgnant effusion vs simple transudate    Right lung atelectasis   - should improve as surrounding fluid is drained   -Additionally patient should be encouraged to use incentive spirometer multiple times each hour    -Chest physiotherapy as well as occupational and physical therapy  Possible aspiration pneumonia High risk due to alcoholism history  - Currently on Zithromax and Rocephin consider changing to Unasyn per usual asp pna carepath   Moderate protein calorie  malnutrition  -Patient high risk for refeeding syndrome and complications during hospitalization -RD nutritional evaluation  Severe hyponatremia  -Multiple severe electrolyte derangements-see consultation    Thank you for allowing me to participate in the care of this patient.   Patient/Family are satisfied with care plan and all questions have been answered.  This document was prepared using Dragon voice recognition software and may include unintentional dictation errors.     FOttie Glazier M.D.  Division of Pulmonary & Critical  [place]

## 2021-01-05 NOTE — Progress Notes (Addendum)
PROGRESS NOTE    [name]  [id number] DOB: 17-Jan-1976 DOA: 01/04/2021 PCP: Langston Reusing, NP   Assessment & Plan:   Active Problems:   Anxiety   Palpitations - evaluated by Dr. Cathie Olden in the past   Malnutrition of moderate degree ([place])   Alcohol abuse   Abnormal laboratory test result   Acute hypoxemic respiratory failure due to COVID-19 [facility])   Acute hypoxic respiratory failure: secondary to COVID-19 infection & bacterial infection. Continue on IV remdesivir, IV steroids, IV ceftriaxone & azithromycin & bronchodilators. Encourage incentive spirometry. Continue on supplemental oxygen and wean as tolerated   COVID19 pneumonia: continue on IV remdesivir, IV steroids & bronchodilators. Encourage incentive spirometry  Superimposed bacterial pneumonia: continue on IV unasyn, IV steroids & bronchodilators. Encourage incentive spirometry. Procal 3.45  Sepsis: meets criteria w/ leukocytosis, tachypnea & pneumonia. Management as stated above  Empyema: s/p thoracentesis w/ fluid studies pending & pneumothorax approx 10% after thoracentesis. Initial imaging showed collapse of right middle lobe in the lung. Likely secondary to above infections but cannot r/o underlying malignancy. Pulmon following and recs apprec. Surgery consulted and recs IR consult in AM for chest tube placement. Continue on IV abxs  Hyponatremia: severe, possibly secondary to chronic daily alcohol use. Continue to monitor Na level closely to prevent central pontine myelinosis. Nephro consulted   AKI: baseline Cr around 0.5. Cr is trending down from day prior. Will continue to monitor   Hypotension: continue on midodrine.   Alcohol abuse: continue on CIWA protocol. Alcohol cessation counseling   Normocytic anemia: no need for a transfusion currently   Leukocytosis: likely secondary to infection. Continue on IV abxs/anti-virals  Tobacco abuse: nicotine patch to prevent w/drawal. Smoking cessation  counseling   Protein malnutrition: likely secondary to chronic daily alcohol use. Nutrition has been consulted    DVT prophylaxis: lovenox  Code Status: full  Family Communication:  Disposition Plan: depends on PT/OT recs (not consulted yet)   Status is: Inpatient  Remains inpatient appropriate because:Ongoing diagnostic testing needed not appropriate for outpatient work up, Unsafe d/c plan, IV treatments appropriate due to intensity of illness or inability to take PO and Inpatient level of care appropriate due to severity of illness   Dispo: The patient is from: Home              Anticipated d/c is to: Home              Anticipated d/c date is: > 3 days              Patient currently is not medically stable to d/c.     Consultants:   Nephro: Dr. Holley Raring   Pulmon: Dr. Lanney Gins  Surgery: Dr. Windell Moment   Procedures:    Antimicrobials: unasyn    Subjective: Pt c/o shortness of breath   Objective: Vitals:   01/05/21 0707 01/05/21 1000 01/05/21 1300 01/05/21 1400  BP: (!) 89/62 (!) 83/71 (!) 103/58 98/60  Pulse: 64 67 66 70  Resp: 14 19  19   Temp:      TempSrc:      SpO2: 92% 93% 93% 93%  Weight:      Height:        Intake/Output Summary (Last 24 hours) at 01/05/2021 1538 Last data filed at 01/05/2021 0440 Gross per 24 hour  Intake 4451.56 ml  Output --  Net 4451.56 ml   Filed Weights   01/04/21 1341  Weight: 65.8 kg    Examination:  General exam:  Appears calm but uncomfortable  Respiratory system: diminished breath sounds b/l R>L  Cardiovascular system: S1 & S2 +. No rubs, gallops or clicks. Gastrointestinal system: Abdomen is nondistended, soft and nontender.  Normal bowel sounds heard. Central nervous system: Alert and oriented. Moves all 4 extremities  Psychiatry: Judgement and insight appear normal. Flat mood and affect    Data Reviewed: I have personally reviewed following labs and imaging studies  CBC: Recent Labs  Lab  01/04/21 1414 01/05/21 0905  WBC 23.7* 16.7*  NEUTROABS 21.2* 13.9*  HGB 9.7* 9.1*  HCT 26.5* 25.0*  MCV 91.1 91.9  PLT 229 [id number]   Basic Metabolic Panel: Recent Labs  Lab 01/04/21 1414 01/04/21 1546 01/04/21 1553 01/04/21 1944 01/05/21 0341 01/05/21 0905  NA 113* 114*  --  115* 121* 124*  K 2.9* 2.8*  --  2.6* 4.4 3.8  CL 69* 69*  --  73* 80* 82*  CO2 21* 24  --  22 21* 23  GLUCOSE 94 97  --  88 110* 128*  BUN 30* 30*  --  29* 25* 24*  CREATININE 1.28* 1.38*  --  1.23* 1.08* 1.00  CALCIUM 7.4* 7.4*  --  7.0* 7.4* 7.4*  MG  --   --  1.4*  --   --   --   PHOS  --   --  3.2  --   --   --    GFR: Estimated Creatinine Clearance: 59.5 mL/min (by C-G formula based on SCr of 1 mg/dL). Liver Function Tests: Recent Labs  Lab 01/04/21 1414 01/04/21 1546  AST 145* 155*  ALT 27 31  ALKPHOS 96 102  BILITOT 4.0* 4.3*  PROT 6.4* 6.8  ALBUMIN 2.3* 2.3*   No results for input(s): LIPASE, AMYLASE in the last 168 hours. No results for input(s): AMMONIA in the last 168 hours. Coagulation Profile: Recent Labs  Lab 01/04/21 1441  INR 1.3*   Cardiac Enzymes: No results for input(s): CKTOTAL, CKMB, CKMBINDEX, TROPONINI in the last 168 hours. BNP (last 3 results) No results for input(s): PROBNP in the last 8760 hours. HbA1C: No results for input(s): HGBA1C in the last 72 hours. CBG: No results for input(s): GLUCAP in the last 168 hours. Lipid Profile: No results for input(s): CHOL, HDL, LDLCALC, TRIG, CHOLHDL, LDLDIRECT in the last 72 hours. Thyroid Function Tests: No results for input(s): TSH, T4TOTAL, FREET4, T3FREE, THYROIDAB in the last 72 hours. Anemia Panel: No results for input(s): VITAMINB12, FOLATE, FERRITIN, TIBC, IRON, RETICCTPCT in the last 72 hours. Sepsis Labs: Recent Labs  Lab 01/04/21 1414 01/04/21 1539 01/04/21 1700  PROCALCITON 3.45  --   --   LATICACIDVEN  --  2.6* 3.0*    Recent Results (from the past 240 hour(s))  Resp Panel by RT-PCR (Flu A&B,  Covid) Nasopharyngeal Swab     Status: Abnormal   Collection Time: 01/04/21  3:40 PM   Specimen: Nasopharyngeal Swab; Nasopharyngeal(NP) swabs in vial transport medium  Result Value Ref Range Status   SARS Coronavirus 2 by RT PCR POSITIVE (A) NEGATIVE Final    Comment: RESULT CALLED TO, READ BACK BY AND VERIFIED WITH: CANDICE KIO 01/04/21 AT [id number] BY ACR (NOTE) SARS-CoV-2 target nucleic acids are DETECTED.  The SARS-CoV-2 RNA is generally detectable in upper respiratory specimens during the acute phase of infection. Positive results are indicative of the presence of the identified virus, but do not rule out bacterial infection or co-infection with other pathogens not detected by the test. Clinical correlation  with patient history and other diagnostic information is necessary to determine patient infection status. The expected result is Negative.  Fact Sheet for Patients: EntrepreneurPulse.com.au  Fact Sheet for Healthcare Providers: IncredibleEmployment.be  This test is not yet approved or cleared by the [place] FDA and  has been authorized for detection and/or diagnosis of SARS-CoV-2 by FDA under an Emergency Use Authorization (EUA).  This EUA will remain in effect (meaning this test can b e used) for the duration of  the COVID-19 declaration under Section 564(b)(1) of the Act, 21 U.S.C. section 360bbb-3(b)(1), unless the authorization is terminated or revoked sooner.     Influenza A by PCR NEGATIVE NEGATIVE Final   Influenza B by PCR NEGATIVE NEGATIVE Final    Comment: (NOTE) The Xpert Xpress SARS-CoV-2/FLU/RSV plus assay is intended as an aid in the diagnosis of influenza from Nasopharyngeal swab specimens and should not be used as a sole basis for treatment. Nasal washings and aspirates are unacceptable for Xpert Xpress SARS-CoV-2/FLU/RSV testing.  Fact Sheet for Patients: EntrepreneurPulse.com.au  Fact Sheet for  Healthcare Providers: IncredibleEmployment.be  This test is not yet approved or cleared by the [place] FDA and has been authorized for detection and/or diagnosis of SARS-CoV-2 by FDA under an Emergency Use Authorization (EUA). This EUA will remain in effect (meaning this test can be used) for the duration of the COVID-19 declaration under Section 564(b)(1) of the Act, 21 U.S.C. section 360bbb-3(b)(1), unless the authorization is terminated or revoked.  Performed at [facility], [address] [place], [place] [address]   Blood culture (routine x 2)     Status: None (Preliminary result)   Collection Time: 01/04/21  3:45 PM   Specimen: BLOOD  Result Value Ref Range Status   Specimen Description BLOOD BLOOD RIGHT HAND  Final   Special Requests   Final    BOTTLES DRAWN AEROBIC AND ANAEROBIC Blood Culture adequate volume   Culture   Final    NO GROWTH < 24 HOURS Performed at [facility], [address], [place], [place] [address]    Report Status PENDING  Incomplete  Blood culture (routine x 2)     Status: None (Preliminary result)   Collection Time: 01/04/21  3:53 PM   Specimen: BLOOD  Result Value Ref Range Status   Specimen Description BLOOD LEFT ANTECUBITAL  Final   Special Requests   Final    BOTTLES DRAWN AEROBIC AND ANAEROBIC Blood Culture adequate volume   Culture   Final    NO GROWTH < 24 HOURS Performed at [facility], [address] [place],  [address]    Report Status PENDING  Incomplete         Radiology Studies: CT HEAD WO CONTRAST  Result Date: 01/04/2021 CLINICAL DATA:  Right-sided weakness EXAM: CT HEAD WITHOUT CONTRAST TECHNIQUE: Contiguous axial images were obtained from the base of the skull through the vertex without intravenous contrast. COMPARISON:  None. FINDINGS: Brain: There is mild diffuse atrophy for age. Prominence of the cisterna magna is an anatomic variant. There is no  intracranial mass, hemorrhage, extra-axial fluid collection, or midline shift. Brain parenchyma appears unremarkable; no acute infarct is demonstrable. Vascular: No hyperdense vessel. No appreciable vascular calcification. Skull: Bony calvarium appears intact. Sinuses/Orbits: Visualized paranasal sinuses are clear. Visualized orbits appear symmetric bilaterally. Other: Mastoid air cells are clear. IMPRESSION: Mild atrophy for age. Brain parenchyma appears unremarkable. No mass or hemorrhage. Electronically Signed   By: Lowella Grip III M.D.  On: 01/04/2021 14:05   CT Chest W Contrast  Result Date: 01/04/2021 CLINICAL DATA:  Right-sided pleural effusion, shortness of breath, leukocytosis. EXAM: CT CHEST WITH CONTRAST TECHNIQUE: Multidetector CT imaging of the chest was performed during intravenous contrast administration. CONTRAST:  32mL OMNIPAQUE IOHEXOL 300 MG/ML  SOLN COMPARISON:  Chest x-ray 01/04/2021, CT abdomen pelvis 08/15/2018. FINDINGS: Cardiovascular: Normal heart size. No significant pericardial effusion. The thoracic aorta is normal in caliber. No atherosclerotic plaque of the thoracic aorta. No coronary artery calcifications. Mediastinum/Nodes: Prominent infra cardiac lymph nodes measuring up to 0.6 cm (2:110). No enlarged mediastinal, hilar, or axillary lymph nodes. Thyroid gland, trachea, and esophagus demonstrate no significant findings. Lungs/Pleura: Complete collapse of the right middle lobe and partial collapse of the right lower lobe. Within the collapsed right middle lobe, several rounded hypodensities are noted (2:90). No cavitary lesion identified. No associated surrounding enhancing/thick-walled capsule. Nonspecific calcification within the collapsed right lower lobe. Passive atelectasis of the right upper lobe. Couple scattered peribronchovascular ground-glass airspace opacities within the left upper lobe. Suggestion of round atelectasis at the left base (2:123). Small to moderate  volume right pleural effusion. No definite pleural thickening or enhancement to suggest empyema. No pleural effusion on the left. No pneumothorax bilaterally. Upper Abdomen: The hepatic parenchyma is diffusely hypodense compared to the splenic parenchyma consistent with fatty infiltration. Otherwise no acute abnormality. Musculoskeletal: No chest wall abnormality. No acute or significant osseous findings. IMPRESSION: 1. Small to moderate right pleural effusion with associated complete collapse of right middle lobe and partial collapse of right lower lobe. Several rounded hypodensities within the collapsed right middle lobe likely represents infection/inflammation with no definite findings to suggest abscess formation. Recommend short-term repeat CT chest with intravenous contrast for evaluation of resolution and exclude underlying malignancy. 2. Few scattered ground-glass airspace opacities within the of the left lower lobe could represent atelectasis versus infection/inflammation. Electronically Signed   By: Iven Finn M.D.   On: 01/04/2021 17:28   DG Chest Portable 1 View  Result Date: 01/04/2021 CLINICAL DATA:  Weakness EXAM: PORTABLE CHEST 1 VIEW COMPARISON:  11/17/2014 FINDINGS: Minimal airspace disease at the peripheral left lung base. At least moderate size right pleural effusion which appears loculated. Consolidation at the right middle lobe and right base. Cardiomediastinal silhouette is obscured. No pneumothorax. IMPRESSION: 1. At least moderate size right pleural effusion which appears loculated. Consolidation at the right middle lobe and right base may reflect atelectasis or pneumonia. 2. Minimal airspace disease at the peripheral left lung base. Electronically Signed   By: Donavan Foil M.D.   On: 01/04/2021 16:20        Scheduled Meds: . citric acid-potassium citrate  40 mEq Oral Once  . dexamethasone (DECADRON) injection  6 mg Intravenous Q24H  . enoxaparin (LOVENOX) injection  40  mg Subcutaneous Q24H  . folic acid  1 mg Oral Daily  . midodrine  5 mg Oral BID WC  . multivitamin with minerals  1 tablet Oral Daily  . nicotine  21 mg Transdermal Once  . thiamine  100 mg Oral Daily   Or  . thiamine  100 mg Intravenous Daily   Continuous Infusions: . azithromycin Stopped (01/04/21 2334)  . cefTRIAXone (ROCEPHIN)  IV Stopped (01/05/21 0954)  . remdesivir 100 mg in NS 100 mL Stopped (01/05/21 1051)     LOS: 1 day    Time spent: 38 mins    Wyvonnia Dusky, MD [facility] Pager [phone]  If 7PM-7AM, please contact night-coverage  01/05/2021, 3:38 PM

## 2021-01-05 NOTE — ED Notes (Signed)
Report off to jennifer rn 

## 2021-01-05 NOTE — Consult Note (Signed)
[facility] CONSULT NOTE    Date: 01/05/2021                  Patient Name:  [name]  MRN: [id number]  DOB: 1976-11-24  Age / Sex: 45 y.o., female         PCP: Langston Reusing, NP                 Service Requesting Consult:  Hospitalist                 Reason for Consult:  Hyponatremia            History of Present Illness: Patient is a 45 y.o. female with a PMHx of palpitations, alcohol abuse, atopic dermatitis, history of cervical cancer, who was admitted to [facility] on 01/04/2021 for evaluation of generalized weakness. Patient poor historian and unable to offer significant history at this time given her lethargy. Apparently per review of dictated history and physical it appears that the patient was consuming 8-10 beers per day. We are asked to see the patient for evaluation management of hyponatremia. Acuity of the hyponatremia unknown however noted to be correcting with just fluid bolus indicating that there is likely some acute component to the hyponatremia. Patient also found to be COVID-19 positive. Also noted to be hypokalemic with serum potassium of 2.8. Patient also found to have acute kidney injury with creatinine of 1.38 upon admission.   Medications: Outpatient medications: (Not in a hospital admission)   Current medications: Current Facility-Administered Medications  Medication Dose Route Frequency Provider Last Rate Last Admin  . acetaminophen (TYLENOL) tablet 325 mg  325 mg Oral Q6H PRN Cox, Amy N, DO      . [START ON 01/06/2021] Ampicillin-Sulbactam (UNASYN) 3 g in sodium chloride 0.9 % 100 mL IVPB  3 g Intravenous Q6H Wyvonnia Dusky, MD      . citric acid-potassium citrate (POLYCITRA) 10 mEq/5 ml solution 40 mEq  40 mEq Oral Once Cox, Amy N, DO      . dexamethasone (DECADRON) injection 6 mg  6 mg Intravenous Q24H Cox, Amy N, DO   6 mg at 01/05/21 0911  . dextrose 5 % solution   Intravenous Continuous Wyvonnia Dusky, MD 50  mL/hr at 01/05/21 1811 New Bag at 01/05/21 1811  . enoxaparin (LOVENOX) injection 40 mg  40 mg Subcutaneous Q24H Cox, Amy N, DO   40 mg at [id number]  . folic acid (FOLVITE) tablet 1 mg  1 mg Oral Daily Cox, Amy N, DO   1 mg at 01/05/21 0912  . LORazepam (ATIVAN) tablet 1-4 mg  1-4 mg Oral Q1H PRN Cox, Amy N, DO       Or  . LORazepam (ATIVAN) injection 1-4 mg  1-4 mg Intravenous Q1H PRN Cox, Amy N, DO      . midodrine (PROAMATINE) tablet 5 mg  5 mg Oral BID WC Cox, Amy N, DO   5 mg at 01/05/21 1722  . multivitamin with minerals tablet 1 tablet  1 tablet Oral Daily Cox, Amy N, DO   1 tablet at 01/05/21 0911  . nicotine (NICODERM CQ - dosed in mg/24 hours) patch 21 mg  21 mg Transdermal Once Wyvonnia Dusky, MD   21 mg at 01/05/21 1722  . ondansetron (ZOFRAN) tablet 4 mg  4 mg Oral Q6H PRN Cox, Amy N, DO       Or  . ondansetron (ZOFRAN) injection 4  mg  4 mg Intravenous Q6H PRN Cox, Amy N, DO      . remdesivir 100 mg in sodium chloride 0.9 % 100 mL IVPB  100 mg Intravenous Daily Cox, Amy N, DO   Stopped at 01/05/21 1051  . thiamine tablet 100 mg  100 mg Oral Daily Cox, Amy N, DO   100 mg at 01/05/21 1497   Or  . thiamine (B-1) injection 100 mg  100 mg Intravenous Daily Cox, Amy N, DO   100 mg at 01/04/21 2106   Current Outpatient Medications  Medication Sig Dispense Refill  . propranolol (INDERAL) 40 MG tablet TAKE 1 TABLET(40 MG) BY MOUTH EVERY MORNING 14 tablet 0      Allergies: No Known Allergies    Past Medical History: Past Medical History:  Diagnosis Date  . Abnormal Pap smear    Age 59  . 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 Alcohol abuse   . Anxiety   . Bacterial infection   . Cervical intraepithelial neoplasia III   . 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 Condyloma 2011  . H/O fatigue 2009  . H/O varicella   . Headache(784.0)   . HPV (human papilloma virus) anogenital infection   . Hx of  dizziness 09/20/2011  . Hx: UTI (urinary tract infection)    Back pain  . Irregular menstrual cycle   . IV drug user   . Palpitations 2010  . Pelvic pain in female 12/05/11  . 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 Trichomonas   . Yeast infection      Past Surgical History: Past Surgical History:  Procedure Laterality Date  . A*wisdom teeth ext    . CERVICAL CONIZATION W/BX  11/30/2011   Procedure: CONIZATION CERVIX WITH BIOPSY;  Surgeon: [name], MD;  Location: [facility];  Service: Gynecology;  Laterality: N/A;  . CERVICAL CONIZATION W/BX  03/16/2012   Procedure: CONIZATION CERVIX WITH BIOPSY;  Surgeon: Ena Dawley, MD;  Location: [facility];  Service: Gynecology;  Laterality: N/A;  . CONIZATION CERVIX     x 3  . DILATION AND CURETTAGE OF UTERUS  11/30/2011   Procedure: DILATATION AND CURETTAGE;  Surgeon: [name], MD;  Location: [facility];  Service: Gynecology;  Laterality: N/A;  . svd     x 1     Family History: Family History  Problem Relation Age of Onset  . Cervical cancer Mother   . Breast cancer Mother   . Cancer Mother 41       Breast & cervical   . Stroke Maternal Grandmother   . COPD Maternal Grandfather        Emphysema  . Hypertension Paternal Grandfather   . Stroke Paternal Grandfather   . Cancer Father 29       prostate     Social History: Social History   Socioeconomic History  . Marital status: Divorced    Spouse name: Not on file  . Number of children: 1  . Years of education: Not on file  . Highest education level: Master's degree (e.g., MA, MS, MEng, MEd, MSW, MBA)  Occupational History  . Occupation: unemployed  Tobacco Use  . Smoking status: Current Every Day Smoker    Years: 30.00    Types: Cigarettes  . Smokeless tobacco: Former Systems developer  . Tobacco comment: less than 8 a day  Vaping Use  . Vaping Use: Never  used  Substance and Sexual Activity  . Alcohol use: Yes    Alcohol/week: 8.0 - 10.0 standard drinks    Types: 8 - 10  Cans of beer per week    Comment: every day after work   . 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 Sexual activity: Not Currently    Birth control/protection: Abstinence  Other Topics Concern  . Not on file  Social History Narrative   Work or School: clinical addiction specialist      Home Situation: lives with daughter and wife      Spiritual Beliefs: none      Lifestyle: no regular exercise; good diet          On Food stamps   Social Determinants of Health   Financial Resource Strain: Not on file  Food Insecurity: Not on file  Transportation Needs: Not on file  Physical Activity: Not on file  Stress: Not on file  Social Connections: Not on file  Intimate Partner Violence: Not on file     Review of Systems: Patient unable to offer review of systems given lethargy upon initial evaluation  Vital Signs: Blood pressure 101/69, pulse 78, temperature 97.7 F (36.5 C), temperature source Oral, resp. rate 18, height 5' (1.524 m), weight 65.8 kg, SpO2 97 %.  Weight trends: Filed Weights   01/04/21 1341  Weight: 65.8 kg    Physical Exam: Physical Exam: General:  No acute distress  Head:  Normocephalic, atraumatic. Moist oral mucosal membranes  Eyes:  Anicteric  Neck:  Supple  Lungs:   Clear to auscultation, normal effort  Heart:  S1S2 no rubs  Abdomen:   Soft, nontender, bowel sounds present  Extremities:  No lower extremity edema  Neurologic:  Lethargic but is arousable  Skin:  No acute skin rash    Lab results: Basic Metabolic Panel: Recent Labs  Lab 01/04/21 1553 01/04/21 1944 01/05/21 0341 01/05/21 0905 01/05/21 1719  NA  --    < > 121* 124* 127*  K  --    < > 4.4 3.8 3.9  CL  --    < > 80* 82* 87*  CO2  --    < > 21* 23 24  GLUCOSE  --    < > 110* 128* 188*  BUN  --    < > 25* 24* 20  CREATININE  --    < > 1.08* 1.00 0.81  CALCIUM  --    < > 7.4* 7.4* 9.0  MG 1.4*  --   --   --   --   PHOS  3.2  --   --   --   --    < > = values in this interval not displayed.    Liver Function Tests: Recent Labs  Lab 01/04/21 1414 01/04/21 1546  AST 145* 155*  ALT 27 31  ALKPHOS 96 102  BILITOT 4.0* 4.3*  PROT 6.4* 6.8  ALBUMIN 2.3* 2.3*   No results for input(s): LIPASE, AMYLASE in the last 168 hours. No results for input(s): AMMONIA in the last 168 hours.  CBC: Recent Labs  Lab 01/04/21 1414 01/05/21 0905  WBC 23.7* 16.7*  NEUTROABS 21.2* 13.9*  HGB 9.7* 9.1*  HCT 26.5* 25.0*  MCV 91.1 91.9  PLT 229 206    Cardiac Enzymes: No results for input(s): CKTOTAL, CKMB, CKMBINDEX, TROPONINI in the last 168 hours.  BNP:  Invalid input(s): POCBNP  CBG: Recent Labs  Lab 01/05/21 [place]*    Microbiology: Results for orders placed or performed during the hospital encounter of 01/04/21  Resp Panel by RT-PCR (Flu A&B, Covid) Nasopharyngeal Swab     Status: Abnormal   Collection Time: 01/04/21  3:40 PM   Specimen: Nasopharyngeal Swab; Nasopharyngeal(NP) swabs in vial transport medium  Result Value Ref Range Status   SARS Coronavirus 2 by RT PCR POSITIVE (A) NEGATIVE Final    Comment: RESULT CALLED TO, READ BACK BY AND VERIFIED WITH: CANDICE KIO 01/04/21 AT [id number] BY ACR (NOTE) SARS-CoV-2 target nucleic acids are DETECTED.  The SARS-CoV-2 RNA is generally detectable in upper respiratory specimens during the acute phase of infection. Positive results are indicative of the presence of the identified virus, but do not rule out bacterial infection or co-infection with other pathogens not detected by the test. Clinical correlation with patient history and other diagnostic information is necessary to determine patient infection status. The expected result is Negative.  Fact Sheet for Patients: EntrepreneurPulse.com.au  Fact Sheet for Healthcare Providers: IncredibleEmployment.be  This test is not yet approved or cleared by the  [place] FDA and  has been authorized for detection and/or diagnosis of SARS-CoV-2 by FDA under an Emergency Use Authorization (EUA).  This EUA will remain in effect (meaning this test can b e used) for the duration of  the COVID-19 declaration under Section 564(b)(1) of the Act, 21 U.S.C. section 360bbb-3(b)(1), unless the authorization is terminated or revoked sooner.     Influenza A by PCR NEGATIVE NEGATIVE Final   Influenza B by PCR NEGATIVE NEGATIVE Final    Comment: (NOTE) The Xpert Xpress SARS-CoV-2/FLU/RSV plus assay is intended as an aid in the diagnosis of influenza from Nasopharyngeal swab specimens and should not be used as a sole basis for treatment. Nasal washings and aspirates are unacceptable for Xpert Xpress SARS-CoV-2/FLU/RSV testing.  Fact Sheet for Patients: EntrepreneurPulse.com.au  Fact Sheet for Healthcare Providers: IncredibleEmployment.be  This test is not yet approved or cleared by the [place] FDA and has been authorized for detection and/or diagnosis of SARS-CoV-2 by FDA under an Emergency Use Authorization (EUA). This EUA will remain in effect (meaning this test can be used) for the duration of the COVID-19 declaration under Section 564(b)(1) of the Act, 21 U.S.C. section 360bbb-3(b)(1), unless the authorization is terminated or revoked.  Performed at [facility], [address] [place], [place] [address]   Blood culture (routine x 2)     Status: None (Preliminary result)   Collection Time: 01/04/21  3:45 PM   Specimen: BLOOD  Result Value Ref Range Status   Specimen Description BLOOD BLOOD RIGHT HAND  Final   Special Requests   Final    BOTTLES DRAWN AEROBIC AND ANAEROBIC Blood Culture adequate volume   Culture   Final    NO GROWTH < 24 HOURS Performed at [facility], [address], [place], [place] [address]    Report Status PENDING  Incomplete  Blood culture (routine x  2)     Status: None (Preliminary result)   Collection Time: 01/04/21  3:53 PM   Specimen: BLOOD  Result Value Ref Range Status   Specimen Description BLOOD LEFT ANTECUBITAL  Final   Special Requests   Final    BOTTLES DRAWN AEROBIC AND ANAEROBIC Blood Culture adequate volume   Culture   Final    NO GROWTH < 24 HOURS Performed at [facility],  [place], [place] [address]    Report Status PENDING  Incomplete    Coagulation Studies: Recent Labs    01/04/21 1441  LABPROT 16.0*  INR 1.3*    Urinalysis: Recent Labs    01/05/21 1527  COLORURINE YELLOW*  LABSPEC 1.015  PHURINE 5.0  GLUCOSEU NEGATIVE  HGBUR MODERATE*  BILIRUBINUR NEGATIVE  KETONESUR 20*  PROTEINUR NEGATIVE  NITRITE NEGATIVE  LEUKOCYTESUR TRACE*      Imaging: CT HEAD WO CONTRAST  Result Date: 01/04/2021 CLINICAL DATA:  Right-sided weakness EXAM: CT HEAD WITHOUT CONTRAST TECHNIQUE: Contiguous axial images were obtained from the base of the skull through the vertex without intravenous contrast. COMPARISON:  None. FINDINGS: Brain: There is mild diffuse atrophy for age. Prominence of the cisterna magna is an anatomic variant. There is no intracranial mass, hemorrhage, extra-axial fluid collection, or midline shift. Brain parenchyma appears unremarkable; no acute infarct is demonstrable. Vascular: No hyperdense vessel. No appreciable vascular calcification. Skull: Bony calvarium appears intact. Sinuses/Orbits: Visualized paranasal sinuses are clear. Visualized orbits appear symmetric bilaterally. Other: Mastoid air cells are clear. IMPRESSION: Mild atrophy for age. Brain parenchyma appears unremarkable. No mass or hemorrhage. Electronically Signed   By: Lowella Grip III M.D.   On: 01/04/2021 14:05   CT Chest W Contrast  Result Date: 01/04/2021 CLINICAL DATA:  Right-sided pleural effusion, shortness of breath, leukocytosis. EXAM: CT CHEST WITH CONTRAST TECHNIQUE: Multidetector CT imaging of  the chest was performed during intravenous contrast administration. CONTRAST:  42mL OMNIPAQUE IOHEXOL 300 MG/ML  SOLN COMPARISON:  Chest x-ray 01/04/2021, CT abdomen pelvis 08/15/2018. FINDINGS: Cardiovascular: Normal heart size. No significant pericardial effusion. The thoracic aorta is normal in caliber. No atherosclerotic plaque of the thoracic aorta. No coronary artery calcifications. Mediastinum/Nodes: Prominent infra cardiac lymph nodes measuring up to 0.6 cm (2:110). No enlarged mediastinal, hilar, or axillary lymph nodes. Thyroid gland, trachea, and esophagus demonstrate no significant findings. Lungs/Pleura: Complete collapse of the right middle lobe and partial collapse of the right lower lobe. Within the collapsed right middle lobe, several rounded hypodensities are noted (2:90). No cavitary lesion identified. No associated surrounding enhancing/thick-walled capsule. Nonspecific calcification within the collapsed right lower lobe. Passive atelectasis of the right upper lobe. Couple scattered peribronchovascular ground-glass airspace opacities within the left upper lobe. Suggestion of round atelectasis at the left base (2:123). Small to moderate volume right pleural effusion. No definite pleural thickening or enhancement to suggest empyema. No pleural effusion on the left. No pneumothorax bilaterally. Upper Abdomen: The hepatic parenchyma is diffusely hypodense compared to the splenic parenchyma consistent with fatty infiltration. Otherwise no acute abnormality. Musculoskeletal: No chest wall abnormality. No acute or significant osseous findings. IMPRESSION: 1. Small to moderate right pleural effusion with associated complete collapse of right middle lobe and partial collapse of right lower lobe. Several rounded hypodensities within the collapsed right middle lobe likely represents infection/inflammation with no definite findings to suggest abscess formation. Recommend short-term repeat CT chest with  intravenous contrast for evaluation of resolution and exclude underlying malignancy. 2. Few scattered ground-glass airspace opacities within the of the left lower lobe could represent atelectasis versus infection/inflammation. Electronically Signed   By: Iven Finn M.D.   On: 01/04/2021 17:28   DG Chest Port 1 View  Result Date: 01/05/2021 CLINICAL DATA:  Status post thoracentesis. EXAM: PORTABLE CHEST 1 VIEW COMPARISON:  Chest x-ray 01/04/2021 FINDINGS: Status post right-sided thoracentesis with a evacuation of a moderate amount of pleural fluid. There is still a moderate-sized, likely loculated pleural  effusion. There is a small postprocedural pneumothorax noted. The left lung remains relatively clear. IMPRESSION: Status post right-sided thoracentesis with residual loculated pleural fluid and a small postprocedural pneumothorax. Electronically Signed   By: Marijo Sanes M.D.   On: 01/05/2021 16:32   DG Chest Portable 1 View  Result Date: 01/04/2021 CLINICAL DATA:  Weakness EXAM: PORTABLE CHEST 1 VIEW COMPARISON:  11/17/2014 FINDINGS: Minimal airspace disease at the peripheral left lung base. At least moderate size right pleural effusion which appears loculated. Consolidation at the right middle lobe and right base. Cardiomediastinal silhouette is obscured. No pneumothorax. IMPRESSION: 1. At least moderate size right pleural effusion which appears loculated. Consolidation at the right middle lobe and right base may reflect atelectasis or pneumonia. 2. Minimal airspace disease at the peripheral left lung base. Electronically Signed   By: Donavan Foil M.D.   On: 01/04/2021 16:20   US THORACENTESIS ASP PLEURAL SPACE W/IMG GUIDE  Result Date: 01/05/2021 INDICATION: Patient with history of palpitations, alcohol abuse, cervical cancer, presented to ED with weakness and shortness of breath. Request is for therapeutic and diagnostic right-sided thoracentesis EXAM: ULTRASOUND GUIDED THERAPEUTIC AND  DIAGNOSTIC RIGHT SIDED THORACENTESIS MEDICATIONS: Lidocaine 1% 10 mL COMPLICATIONS: None immediate. PROCEDURE: An ultrasound guided thoracentesis was thoroughly discussed with the patient and questions answered. The benefits, risks, alternatives and complications were also discussed. The patient understands and wishes to proceed with the procedure. Written consent was obtained. Ultrasound was performed to localize and mark an adequate pocket of fluid in the right chest. The area was then prepped and draped in the normal sterile fashion. 1% Lidocaine was used for local anesthesia. Under ultrasound guidance a 6 Fr Safe-T-Centesis catheter was introduced. Thoracentesis was performed. The catheter was removed and a dressing applied. FINDINGS: A total of approximately 200 mL of thick yellow fluid was removed. The procedure was terminated due to patient's inability to sit still in decompensation of oxygen saturation rate. Samples were sent to the laboratory as requested by the clinical team. IMPRESSION: Successful ultrasound guided right-sided therapeutic and diagnostic thoracentesis yielding 200 mL of pleural fluid. Read by: Rushie Nyhan, NP Electronically Signed   By: Miachel Roux M.D.   On: 01/05/2021 16:23      Assessment & Plan: Pt is a 45 y.o. female with a PMHx of palpitations, alcohol abuse, atopic dermatitis, history of cervical cancer, who was admitted to [facility] on 01/04/2021 for evaluation of generalized weakness.  1. Hyponatremia, suspect a large acute component given rate of correction with only fluid bolus. 2. Alcohol abuse. 3. Hypokalemia, improved.  Plan: Asked to evaluate the patient for hyponatremia. When the patient presented serum sodium was low at 113. She was provided IV fluid bolus. Given the rate of correction with minimal amounts of fluids we suspect that there was an acute component of the hyponatremia and subsequent auto correction. Case discussed with hospitalist. Patient  initially started on D5W to stabilize serum sodium and we will go ahead and increase the rate to 100 cc/h for now. Continue to monitor serum sodium closely. Further plan as patient progresses.

## 2021-01-05 NOTE — ED Notes (Signed)
Messaged admitting MD Jimmye Norman regarding pt request for nicotine patch at this time.

## 2021-01-05 NOTE — ED Notes (Signed)
Pt sleeping. 

## 2021-01-06 ENCOUNTER — Inpatient Hospital Stay: Payer: Self-pay

## 2021-01-06 DIAGNOSIS — J9601 Acute respiratory failure with hypoxia: Secondary | ICD-10-CM

## 2021-01-06 DIAGNOSIS — E871 Hypo-osmolality and hyponatremia: Secondary | ICD-10-CM | POA: Diagnosis not present

## 2021-01-06 DIAGNOSIS — N179 Acute kidney failure, unspecified: Secondary | ICD-10-CM

## 2021-01-06 DIAGNOSIS — E440 Moderate protein-calorie malnutrition: Secondary | ICD-10-CM

## 2021-01-06 DIAGNOSIS — J90 Pleural effusion, not elsewhere classified: Secondary | ICD-10-CM | POA: Diagnosis not present

## 2021-01-06 DIAGNOSIS — J690 Pneumonitis due to inhalation of food and vomit: Secondary | ICD-10-CM | POA: Diagnosis not present

## 2021-01-06 DIAGNOSIS — J1282 Pneumonia due to coronavirus disease 2019: Secondary | ICD-10-CM

## 2021-01-06 DIAGNOSIS — G9340 Encephalopathy, unspecified: Secondary | ICD-10-CM

## 2021-01-06 DIAGNOSIS — R7401 Elevation of levels of liver transaminase levels: Secondary | ICD-10-CM

## 2021-01-06 DIAGNOSIS — F1010 Alcohol abuse, uncomplicated: Secondary | ICD-10-CM

## 2021-01-06 DIAGNOSIS — U071 COVID-19: Secondary | ICD-10-CM

## 2021-01-06 DIAGNOSIS — J869 Pyothorax without fistula: Secondary | ICD-10-CM | POA: Diagnosis not present

## 2021-01-06 LAB — CBC WITH DIFFERENTIAL/PLATELET
Abs Immature Granulocytes: 1.02 10*3/uL — ABNORMAL HIGH (ref 0.00–0.07)
Basophils Absolute: 0.1 10*3/uL (ref 0.0–0.1)
Basophils Relative: 1 %
Eosinophils Absolute: 0 10*3/uL (ref 0.0–0.5)
Eosinophils Relative: 0 %
HCT: 23.5 % — ABNORMAL LOW (ref 36.0–46.0)
Hemoglobin: 8.6 g/dL — ABNORMAL LOW (ref 12.0–15.0)
Immature Granulocytes: 8 %
Lymphocytes Relative: 8 %
Lymphs Abs: 1 10*3/uL (ref 0.7–4.0)
MCH: 33.9 pg (ref 26.0–34.0)
MCHC: 36.6 g/dL — ABNORMAL HIGH (ref 30.0–36.0)
MCV: 92.5 fL (ref 80.0–100.0)
Monocytes Absolute: 0.7 10*3/uL (ref 0.1–1.0)
Monocytes Relative: 5 %
Neutro Abs: 10.6 10*3/uL — ABNORMAL HIGH (ref 1.7–7.7)
Neutrophils Relative %: 78 %
Platelets: 178 10*3/uL (ref 150–400)
RBC: 2.54 MIL/uL — ABNORMAL LOW (ref 3.87–5.11)
RDW: 13.2 % (ref 11.5–15.5)
Smear Review: NORMAL
WBC: 13.5 10*3/uL — ABNORMAL HIGH (ref 4.0–10.5)
nRBC: 0.4 % — ABNORMAL HIGH (ref 0.0–0.2)

## 2021-01-06 LAB — RENAL FUNCTION PANEL
Albumin: 2 g/dL — ABNORMAL LOW (ref 3.5–5.0)
Anion gap: 10 (ref 5–15)
BUN: 14 mg/dL (ref 6–20)
CO2: 28 mmol/L (ref 22–32)
Calcium: 8 mg/dL — ABNORMAL LOW (ref 8.9–10.3)
Chloride: 88 mmol/L — ABNORMAL LOW (ref 98–111)
Creatinine, Ser: 0.61 mg/dL (ref 0.44–1.00)
GFR, Estimated: >60 mL/min (ref >60)
Glucose, Bld: 204 mg/dL — ABNORMAL HIGH (ref 70–99)
Phosphorus: 1.5 mg/dL — ABNORMAL LOW (ref 2.5–4.6)
Potassium: 3.4 mmol/L — ABNORMAL LOW (ref 3.5–5.1)
Sodium: 126 mmol/L — ABNORMAL LOW (ref 135–145)

## 2021-01-06 LAB — TRIGLYCERIDES, BODY FLUIDS: Triglycerides, Fluid: 49 mg/dL

## 2021-01-06 LAB — MAGNESIUM: Magnesium: 1.7 mg/dL (ref 1.7–2.4)

## 2021-01-06 LAB — PHOSPHORUS: Phosphorus: 1.8 mg/dL — ABNORMAL LOW (ref 2.5–4.6)

## 2021-01-06 LAB — C-REACTIVE PROTEIN: CRP: 18.8 mg/dL — ABNORMAL HIGH (ref <1.0)

## 2021-01-06 LAB — FIBRIN DERIVATIVES D-DIMER (ARMC ONLY): Fibrin derivatives D-dimer (ARMC): 1490.89 ng/mL (FEU) — ABNORMAL HIGH (ref 0.00–499.00)

## 2021-01-06 MED ORDER — POTASSIUM & SODIUM PHOSPHATES 280-160-250 MG PO PACK
1.0000 | PACK | Freq: Three times a day (TID) | ORAL | Status: AC
  Administered 2021-01-06 – 2021-01-07 (×4): 1 via ORAL
  Filled 2021-01-06 (×4): qty 1

## 2021-01-06 MED ORDER — NICOTINE 21 MG/24HR TD PT24
21.0000 mg | MEDICATED_PATCH | Freq: Every day | TRANSDERMAL | Status: DC
  Administered 2021-01-06 – 2021-01-09 (×5): 21 mg via TRANSDERMAL
  Filled 2021-01-06 (×4): qty 1

## 2021-01-06 NOTE — ED Notes (Signed)
Breakfast tray given to pt. No further needs expressed.

## 2021-01-06 NOTE — ED Notes (Signed)
Meal tray given 

## 2021-01-06 NOTE — Progress Notes (Signed)
1        [facility] [place] NAME: [name]    MR#:  [id number]  DATE OF BIRTH:  02-Sep-1976  SUBJECTIVE:  CHIEF COMPLAINT:   Chief Complaint  Patient presents with  . Weakness  Seen in the ED, feeling weak.  Remains hypotensive.  Wondering how long she will end up staying in the hospital REVIEW OF SYSTEMS:  Review of Systems  Constitutional: Positive for malaise/fatigue. Negative for diaphoresis, fever and weight loss.  HENT: Negative for ear discharge, ear pain, hearing loss, nosebleeds, sore throat and tinnitus.   Eyes: Negative for blurred vision and pain.  Respiratory: Negative for cough, hemoptysis, shortness of breath and wheezing.   Cardiovascular: Negative for chest pain, palpitations, orthopnea and leg swelling.  Gastrointestinal: Negative for abdominal pain, blood in stool, constipation, diarrhea, heartburn, nausea and vomiting.  Genitourinary: Negative for dysuria, frequency and urgency.  Musculoskeletal: Negative for back pain and myalgias.  Skin: Negative for itching and rash.  Neurological: Negative for dizziness, tingling, tremors, focal weakness, seizures, weakness and headaches.  Psychiatric/Behavioral: Negative for depression. The patient is nervous/anxious.    DRUG ALLERGIES:  No Known Allergies VITALS:  Blood pressure (!) 97/59, pulse 77, temperature 97.7 F (36.5 C), temperature source Oral, resp. rate 18, height 5' (1.524 m), weight 65.8 kg, SpO2 94 %. PHYSICAL EXAMINATION:  Physical Exam Constitutional:      Appearance: She is underweight.  HENT:     Head: Normocephalic and atraumatic.  Eyes:     Extraocular Movements: EOM normal.     Conjunctiva/sclera: Conjunctivae normal.     Pupils: Pupils are equal, round, and reactive to light.  Neck:     Thyroid: No thyromegaly.     Trachea: No tracheal deviation.  Cardiovascular:     Rate and Rhythm: Normal rate and regular rhythm.     Heart sounds: Normal heart sounds.   Pulmonary:     Effort: Pulmonary effort is normal. No respiratory distress.     Breath sounds: Decreased air movement present. Examination of the right-lower field reveals decreased breath sounds and rhonchi. Examination of the left-lower field reveals rhonchi. Decreased breath sounds and rhonchi present. No wheezing.  Chest:     Chest wall: No tenderness.  Abdominal:     General: Bowel sounds are normal. There is no distension.     Palpations: Abdomen is soft.     Tenderness: There is no abdominal tenderness.  Musculoskeletal:        General: Normal range of motion.     Cervical back: Normal range of motion and neck supple.  Skin:    General: Skin is warm and dry.     Findings: No rash.  Neurological:     Mental Status: She is alert and oriented to person, place, and time.     Cranial Nerves: No cranial nerve deficit.    LABORATORY PANEL:  Female CBC Recent Labs  Lab 01/06/21 0526  WBC 13.5*  HGB 8.6*  HCT 23.5*  PLT 178   ------------------------------------------------------------------------------------------------------------------ Chemistries  Recent Labs  Lab 01/04/21 1546 01/04/21 1553 01/06/21 0526 01/06/21 1011  NA 114*   < >  --  126*  K 2.8*   < >  --  3.4*  CL 69*   < >  --  88*  CO2 24   < >  --  28  GLUCOSE 97   < >  --  204*  BUN 30*   < >  --  14  CREATININE 1.38*   < >  --  0.61  CALCIUM 7.4*   < >  --  8.0*  MG  --    < > 1.7  --   AST 155*  --   --   --   ALT 31  --   --   --   ALKPHOS 102  --   --   --   BILITOT 4.3*  --   --   --    < > = values in this interval not displayed.   RADIOLOGY:  DG Chest Port 1 View  Result Date: 01/06/2021 CLINICAL DATA:  Post thoracentesis EXAM: PORTABLE CHEST 1 VIEW COMPARISON:  01/05/2021 FINDINGS: Similar moderate right pleural effusion with small pneumothorax. Adjacent right lung atelectasis. Stable left lung aeration. Stable cardiomediastinal contours. IMPRESSION: Similar right hydropneumothorax.  Electronically Signed   By: Macy Mis M.D.   On: 01/06/2021 08:15   ASSESSMENT AND PLAN:  45 year old female with a known history of alcohol abuse, history of cervical cancer, atopic dermatitis is admitted for feeling weak for about 2 weeks  Acute hypoxic respiratory failure present on admission secondary to COVID-19 infection with possible underlying superimposed bacterial infection Continue remdesivir and steroids Appreciate pulmonary and ID input Continue IV Unasyn for now  Sepsis present on admission due to COVID-19 infection with possible superimposed bacterial infection  Pleural effusion/empyema Status post ultrasound-guided thoracentesis on 1/18 Further management per pulmonary and ID.  May need CT surgery based on the fluid studies  Severe hyponatremia Sodium at 126 (113) now.  Nephrology managing this Off D5W   Hypokalemia Replete and recheck  Alcohol abuse Counseled for cessation, CIWA protocol  Hypotension On midodrine 5 mg p.o. twice daily  Normocytic anemia Stable  Moderate protein calorie malnutrition Secondary to chronic daily alcohol use, nutritionist consult Body mass index is 28.32 kg/m.      Status is: Inpatient  Remains inpatient appropriate because:Unsafe d/c plan   Dispo: The patient is from: Home              Anticipated d/c is to: Home              Anticipated d/c date is: 2 days              Patient currently is not medically stable to d/c.  Needing further work-up per pulmonary and ID for her possible empyema of the lung    DVT prophylaxis:       enoxaparin (LOVENOX) injection 40 mg Start: 01/04/21 2100 Place TED hose Start: 01/04/21 1831     Family Communication: discussed with patient")   All the records are reviewed and case discussed with Care Management/Social Worker. Management plans discussed with the patient, nursing, ID, pulmonary and they are in agreement.  CODE STATUS: Full Code  TOTAL TIME TAKING CARE OF THIS  PATIENT: 35 minutes.   More than 50% of the time was spent in counseling/coordination of care: YES  POSSIBLE D/C IN 2-3 DAYS, DEPENDING ON CLINICAL CONDITION.   Max Sane M.D on 01/06/2021 at 5:37 PM  [facility]   CC: Primary care physician; Langston Reusing, NP  Note: This dictation was prepared with Dragon dictation along with smaller phrase technology. Any transcriptional errors that result from this process are unintentional.

## 2021-01-06 NOTE — Progress Notes (Signed)
[facility]  ROUNDING NOTE   Subjective:  Patient much more awake and alert today. Serum sodium did stabilize at 126. We did start the patient on D5W to stabilize serum sodium.   Objective:  Vital signs in last 24 hours:  Pulse Rate:  [65-94] 77 (01/19 1249) Resp:  [14-25] 18 (01/19 1249) BP: (93-108)/(46-69) 97/59 (01/19 1249) SpO2:  [91 %-100 %] 94 % (01/19 1249)  Weight change:  Filed Weights   01/04/21 1341  Weight: 65.8 kg    Intake/Output: I/O last 3 completed shifts: In: 4451.6 [IV Piggyback:4451.6] Out: -    Intake/Output this shift:  No intake/output data recorded.  Physical Exam: General:  No acute distress  Head:  Normocephalic, atraumatic. Moist oral mucosal membranes  Eyes:  Anicteric  Neck:  Supple  Lungs:   Clear to auscultation, normal effort  Heart:  S1S2 no rubs  Abdomen:   Soft, nontender, bowel sounds present  Extremities:  No peripheral edema.  Neurologic:  Awake, alert, following commands  Skin:  Areas of atopic dermatitis noted       Basic Metabolic Panel: Recent Labs  Lab 01/04/21 1553 01/04/21 1944 01/05/21 0341 01/05/21 0905 01/05/21 1719 01/06/21 0526 01/06/21 1011  NA  --  115* 121* 124* 127*  --  126*  K  --  2.6* 4.4 3.8 3.9  --  3.4*  CL  --  73* 80* 82* 87*  --  88*  CO2  --  22 21* 23 24  --  28  GLUCOSE  --  88 110* 128* 188*  --  204*  BUN  --  29* 25* 24* 20  --  14  CREATININE  --  1.23* 1.08* 1.00 0.81  --  0.61  CALCIUM  --  7.0* 7.4* 7.4* 9.0  --  8.0*  MG 1.4*  --   --   --   --  1.7  --   PHOS 3.2  --   --   --   --  1.8* 1.5*    Liver Function Tests: Recent Labs  Lab 01/04/21 1414 01/04/21 1546 01/06/21 1011  AST 145* 155*  --   ALT 27 31  --   ALKPHOS 96 102  --   BILITOT 4.0* 4.3*  --   PROT 6.4* 6.8  --   ALBUMIN 2.3* 2.3* 2.0*   No results for input(s): LIPASE, AMYLASE in the last 168 hours. No results for input(s): AMMONIA in the last 168 hours.  CBC: Recent Labs  Lab  01/04/21 1414 01/05/21 0905 01/06/21 0526  WBC 23.7* 16.7* 13.5*  NEUTROABS 21.2* 13.9* 10.6*  HGB 9.7* 9.1* 8.6*  HCT 26.5* 25.0* 23.5*  MCV 91.1 91.9 92.5  PLT 229 206 178    Cardiac Enzymes: No results for input(s): CKTOTAL, CKMB, CKMBINDEX, TROPONINI in the last 168 hours.  BNP: Invalid input(s): POCBNP  CBG: Recent Labs  Lab 01/05/21 1825  GLUCAP 186*    Microbiology: Results for orders placed or performed during the hospital encounter of 01/04/21  Resp Panel by RT-PCR (Flu A&B, Covid) Nasopharyngeal Swab     Status: Abnormal   Collection Time: 01/04/21  3:40 PM   Specimen: Nasopharyngeal Swab; Nasopharyngeal(NP) swabs in vial transport medium  Result Value Ref Range Status   SARS Coronavirus 2 by RT PCR POSITIVE (A) NEGATIVE Final    Comment: RESULT CALLED TO, READ BACK BY AND VERIFIED WITH: CANDICE KIO 01/04/21 AT [id number] BY ACR (NOTE) SARS-CoV-2 target nucleic acids are  DETECTED.  The SARS-CoV-2 RNA is generally detectable in upper respiratory specimens during the acute phase of infection. Positive results are indicative of the presence of the identified virus, but do not rule out bacterial infection or co-infection with other pathogens not detected by the test. Clinical correlation with patient history and other diagnostic information is necessary to determine patient infection status. The expected result is Negative.  Fact Sheet for Patients: EntrepreneurPulse.com.au  Fact Sheet for Healthcare Providers: IncredibleEmployment.be  This test is not yet approved or cleared by the [place] FDA and  has been authorized for detection and/or diagnosis of SARS-CoV-2 by FDA under an Emergency Use Authorization (EUA).  This EUA will remain in effect (meaning this test can b e used) for the duration of  the COVID-19 declaration under Section 564(b)(1) of the Act, 21 U.S.C. section 360bbb-3(b)(1), unless the authorization  is terminated or revoked sooner.     Influenza A by PCR NEGATIVE NEGATIVE Final   Influenza B by PCR NEGATIVE NEGATIVE Final    Comment: (NOTE) The Xpert Xpress SARS-CoV-2/FLU/RSV plus assay is intended as an aid in the diagnosis of influenza from Nasopharyngeal swab specimens and should not be used as a sole basis for treatment. Nasal washings and aspirates are unacceptable for Xpert Xpress SARS-CoV-2/FLU/RSV testing.  Fact Sheet for Patients: EntrepreneurPulse.com.au  Fact Sheet for Healthcare Providers: IncredibleEmployment.be  This test is not yet approved or cleared by the [place] FDA and has been authorized for detection and/or diagnosis of SARS-CoV-2 by FDA under an Emergency Use Authorization (EUA). This EUA will remain in effect (meaning this test can be used) for the duration of the COVID-19 declaration under Section 564(b)(1) of the Act, 21 U.S.C. section 360bbb-3(b)(1), unless the authorization is terminated or revoked.  Performed at [facility], [address] [place], [place] [address]   Blood culture (routine x 2)     Status: None (Preliminary result)   Collection Time: 01/04/21  3:45 PM   Specimen: BLOOD  Result Value Ref Range Status   Specimen Description BLOOD BLOOD RIGHT HAND  Final   Special Requests   Final    BOTTLES DRAWN AEROBIC AND ANAEROBIC Blood Culture adequate volume   Culture   Final    NO GROWTH 2 DAYS Performed at [facility], [address] [place], [place] [address]    Report Status PENDING  Incomplete  Blood culture (routine x 2)     Status: None (Preliminary result)   Collection Time: 01/04/21  3:53 PM   Specimen: BLOOD  Result Value Ref Range Status   Specimen Description BLOOD LEFT ANTECUBITAL  Final   Special Requests   Final    BOTTLES DRAWN AEROBIC AND ANAEROBIC Blood Culture adequate volume   Culture   Final    NO GROWTH 2 DAYS Performed at [facility], [address] [place], [place] [address]    Report Status PENDING  Incomplete  Body fluid culture     Status: None (Preliminary result)   Collection Time: 01/05/21  3:55 PM   Specimen: PATH Cytology Pleural fluid  Result Value Ref Range Status   Specimen Description   Final    PLEURAL Performed at [facility], [address], [place], [place] [address]    Special Requests   Final    PLEURAL Performed at [facility], [address] [place], [place] [address]    Gram Stain   Final    ABUNDANT WBC PRESENT, PREDOMINANTLY PMN  NO ORGANISMS SEEN    Culture   Final    NO GROWTH < 12 HOURS Performed at [facility] [address] [place], [place] [address]    Report Status PENDING  Incomplete    Coagulation Studies: Recent Labs    01/04/21 1441  LABPROT 16.0*  INR 1.3*    Urinalysis: Recent Labs    01/05/21 1527  COLORURINE YELLOW*  LABSPEC 1.015  PHURINE 5.0  GLUCOSEU NEGATIVE  HGBUR MODERATE*  BILIRUBINUR NEGATIVE  KETONESUR 20*  PROTEINUR NEGATIVE  NITRITE NEGATIVE  LEUKOCYTESUR TRACE*      Imaging: CT HEAD WO CONTRAST  Result Date: 01/04/2021 CLINICAL DATA:  Right-sided weakness EXAM: CT HEAD WITHOUT CONTRAST TECHNIQUE: Contiguous axial images were obtained from the base of the skull through the vertex without intravenous contrast. COMPARISON:  None. FINDINGS: Brain: There is mild diffuse atrophy for age. Prominence of the cisterna magna is an anatomic variant. There is no intracranial mass, hemorrhage, extra-axial fluid collection, or midline shift. Brain parenchyma appears unremarkable; no acute infarct is demonstrable. Vascular: No hyperdense vessel. No appreciable vascular calcification. Skull: Bony calvarium appears intact. Sinuses/Orbits: Visualized paranasal sinuses are clear. Visualized orbits appear symmetric bilaterally. Other: Mastoid air cells are clear. IMPRESSION: Mild atrophy for age. Brain parenchyma appears  unremarkable. No mass or hemorrhage. Electronically Signed   By: Lowella Grip III M.D.   On: 01/04/2021 14:05   CT Chest W Contrast  Result Date: 01/04/2021 CLINICAL DATA:  Right-sided pleural effusion, shortness of breath, leukocytosis. EXAM: CT CHEST WITH CONTRAST TECHNIQUE: Multidetector CT imaging of the chest was performed during intravenous contrast administration. CONTRAST:  24mL OMNIPAQUE IOHEXOL 300 MG/ML  SOLN COMPARISON:  Chest x-ray 01/04/2021, CT abdomen pelvis 08/15/2018. FINDINGS: Cardiovascular: Normal heart size. No significant pericardial effusion. The thoracic aorta is normal in caliber. No atherosclerotic plaque of the thoracic aorta. No coronary artery calcifications. Mediastinum/Nodes: Prominent infra cardiac lymph nodes measuring up to 0.6 cm (2:110). No enlarged mediastinal, hilar, or axillary lymph nodes. Thyroid gland, trachea, and esophagus demonstrate no significant findings. Lungs/Pleura: Complete collapse of the right middle lobe and partial collapse of the right lower lobe. Within the collapsed right middle lobe, several rounded hypodensities are noted (2:90). No cavitary lesion identified. No associated surrounding enhancing/thick-walled capsule. Nonspecific calcification within the collapsed right lower lobe. Passive atelectasis of the right upper lobe. Couple scattered peribronchovascular ground-glass airspace opacities within the left upper lobe. Suggestion of round atelectasis at the left base (2:123). Small to moderate volume right pleural effusion. No definite pleural thickening or enhancement to suggest empyema. No pleural effusion on the left. No pneumothorax bilaterally. Upper Abdomen: The hepatic parenchyma is diffusely hypodense compared to the splenic parenchyma consistent with fatty infiltration. Otherwise no acute abnormality. Musculoskeletal: No chest wall abnormality. No acute or significant osseous findings. IMPRESSION: 1. Small to moderate right pleural  effusion with associated complete collapse of right middle lobe and partial collapse of right lower lobe. Several rounded hypodensities within the collapsed right middle lobe likely represents infection/inflammation with no definite findings to suggest abscess formation. Recommend short-term repeat CT chest with intravenous contrast for evaluation of resolution and exclude underlying malignancy. 2. Few scattered ground-glass airspace opacities within the of the left lower lobe could represent atelectasis versus infection/inflammation. Electronically Signed   By: Iven Finn M.D.   On: 01/04/2021 17:28   DG Chest Port 1 View  Result Date: 01/06/2021 CLINICAL DATA:  Post thoracentesis EXAM: PORTABLE CHEST 1 VIEW COMPARISON:  01/05/2021 FINDINGS: Similar  moderate right pleural effusion with small pneumothorax. Adjacent right lung atelectasis. Stable left lung aeration. Stable cardiomediastinal contours. IMPRESSION: Similar right hydropneumothorax. Electronically Signed   By: Macy Mis M.D.   On: 01/06/2021 08:15   DG Chest Port 1 View  Result Date: 01/05/2021 CLINICAL DATA:  Status post thoracentesis. EXAM: PORTABLE CHEST 1 VIEW COMPARISON:  Chest x-ray 01/04/2021 FINDINGS: Status post right-sided thoracentesis with a evacuation of a moderate amount of pleural fluid. There is still a moderate-sized, likely loculated pleural effusion. There is a small postprocedural pneumothorax noted. The left lung remains relatively clear. IMPRESSION: Status post right-sided thoracentesis with residual loculated pleural fluid and a small postprocedural pneumothorax. Electronically Signed   By: Marijo Sanes M.D.   On: 01/05/2021 16:32   DG Chest Portable 1 View  Result Date: 01/04/2021 CLINICAL DATA:  Weakness EXAM: PORTABLE CHEST 1 VIEW COMPARISON:  11/17/2014 FINDINGS: Minimal airspace disease at the peripheral left lung base. At least moderate size right pleural effusion which appears loculated. Consolidation  at the right middle lobe and right base. Cardiomediastinal silhouette is obscured. No pneumothorax. IMPRESSION: 1. At least moderate size right pleural effusion which appears loculated. Consolidation at the right middle lobe and right base may reflect atelectasis or pneumonia. 2. Minimal airspace disease at the peripheral left lung base. Electronically Signed   By: Donavan Foil M.D.   On: 01/04/2021 16:20   US THORACENTESIS ASP PLEURAL SPACE W/IMG GUIDE  Result Date: 01/05/2021 INDICATION: Patient with history of palpitations, alcohol abuse, cervical cancer, presented to ED with weakness and shortness of breath. Request is for therapeutic and diagnostic right-sided thoracentesis EXAM: ULTRASOUND GUIDED THERAPEUTIC AND DIAGNOSTIC RIGHT SIDED THORACENTESIS MEDICATIONS: Lidocaine 1% 10 mL COMPLICATIONS: None immediate. PROCEDURE: An ultrasound guided thoracentesis was thoroughly discussed with the patient and questions answered. The benefits, risks, alternatives and complications were also discussed. The patient understands and wishes to proceed with the procedure. Written consent was obtained. Ultrasound was performed to localize and mark an adequate pocket of fluid in the right chest. The area was then prepped and draped in the normal sterile fashion. 1% Lidocaine was used for local anesthesia. Under ultrasound guidance a 6 Fr Safe-T-Centesis catheter was introduced. Thoracentesis was performed. The catheter was removed and a dressing applied. FINDINGS: A total of approximately 200 mL of thick yellow fluid was removed. The procedure was terminated due to patient's inability to sit still in decompensation of oxygen saturation rate. Samples were sent to the laboratory as requested by the clinical team. IMPRESSION: Successful ultrasound guided right-sided therapeutic and diagnostic thoracentesis yielding 200 mL of pleural fluid. Read by: Rushie Nyhan, NP Electronically Signed   By: Miachel Roux M.D.   On:  01/05/2021 16:23     Medications:   . ampicillin-sulbactam (UNASYN) IV Stopped (01/06/21 1101)  . dextrose 100 mL/hr at 01/06/21 0532  . remdesivir 100 mg in NS 100 mL Stopped (01/06/21 1152)   . dexamethasone (DECADRON) injection  6 mg Intravenous Q24H  . enoxaparin (LOVENOX) injection  40 mg Subcutaneous Q24H  . folic acid  1 mg Oral Daily  . midodrine  5 mg Oral BID WC  . multivitamin with minerals  1 tablet Oral Daily  . nicotine  21 mg Transdermal Once  . nicotine  21 mg Transdermal Daily  . potassium & sodium phosphates  1 packet Oral TID AC & HS  . thiamine  100 mg Oral Daily   Or  . thiamine  100 mg Intravenous Daily   acetaminophen,  LORazepam **OR** LORazepam, ondansetron **OR** ondansetron (ZOFRAN) IV  Assessment/ Plan:  45 y.o. female with a PMHx of palpitations, alcohol abuse, atopic dermatitis, history of cervical cancer, who was admitted to [facility] on 01/04/2021 for evaluation of generalized weakness.  1. Hyponatremia, suspect a large acute component given rate of correction with only fluid bolus. 2. Alcohol abuse. 3. Hypokalemia, improved. 4.  COVID-19 pneumonia.  Plan: Serum sodium currently 126.  We will go ahead and discontinue D5W and see if her sodium will self-correct for the next 24 hours.  If sodium plateaus we will start the patient back on normal saline.  Otherwise continue supportive care of COVID-19 pneumonia.   LOS: 2 Taelar Gronewold 1/19/20221:37 PM

## 2021-01-06 NOTE — Consult Note (Signed)
PHARMACY CONSULT NOTE - FOLLOW UP  Pharmacy Consult for Electrolyte Monitoring and Replacement   Recent Labs: Potassium  Date Value  01/05/2021 3.9 mmol/L  07/21/2014 3.7 mEq/L   Magnesium (mg/dL)  Date Value  01/06/2021 1.7   Calcium (mg/dL)  Date Value  01/05/2021 9.0  07/21/2014 8.9   Albumin (g/dL)  Date Value  01/04/2021 2.3 (L)  02/05/2020 4.6  07/21/2014 3.2 (L)   Phosphorus (mg/dL)  Date Value  01/06/2021 1.8 (L)   Sodium  Date Value  01/05/2021 127 mmol/L (L)  02/05/2020 130 mmol/L (L)  07/21/2014 143 mEq/L     Assessment: 45yo Female with PMH of palpitations (on propranolol), daily EtOH abuse, atopic dermatitis, h/o cervical cancer presenting to ED with CC of weakness and difficulty with balance found to be Covid positive.  Pt Covid PNA with Mod Rt Pleural effusion requiring thoracentesis and Rt lung atelectasis w/ c/f possible aspiration PNA. On presentation, pt experiencing multiple electrolyte derangements. Pharmacy consulted for the management of electrolytes.  Na: 115>121>124>127 K: 2.6>4.4>3.8>3.9 Phos: 3.2>1.8 Mg: 1.4>1.7 Ca: 7.0>7.4>9.0 * ~10.4 (Corrected calcium for Albumin 2.3)  Goal of Therapy:  Lytes WNL  Plan:  Na: 115>121>124>127. Correcting appropriately slowly. K: Has returned WNL 2.6>3.9 after cumulative KCL 78meq IV and 80 meq PO on 1/17. No further replacement today. Phos: 3.2 on admit down to 1.8 today. Will add Neutra-Phos K x 4 doses. Will add Mg/Phos to AM labs.  Mg: 1.4>1.7 has returned WNL.   Ca: 7.0>7.4>9.0 has returned WNL. No further replacement today  * ~10.4 (Corrected calcium for Albumin 2.3)  Darnelle Bos ,PharmD Clinical Pharmacist 01/06/2021 9:56 AM

## 2021-01-06 NOTE — ED Notes (Signed)
Pt placed in hospital bed with no issue.

## 2021-01-06 NOTE — Consult Note (Signed)
NAME: [name]  DOB: May 28, 1976  MRN: [id number]  Date/Time: 01/06/2021 3:13 PM  REQUESTING PROVIDER: Dr. Lanney Gins Subjective:  REASON FOR CONSULT: empyema ?Patient is a poor historian.  So chart reviewed.  And spoke to the other consultants. [name] is a 45 y.o. female with a history of etoh use, palpitation on propanolol, atopic dermatitis, history of cervical cancer presented to the ED on 01/04/2021 with weakness for 2 weeks, cough for 1 week and shortness of breath at rest and with activity.  She did not have any fever or abdominal pain or dysuria or hematuria.  She has been feeling dizzy as well.  She consumes 8-10 beers per day. In the ED vitals where BP 90 88/77, temperature 97.7, heart rate 68, respiratory rate 18, sats 99%.  WBC was 23.7, Hb 9.7, platelet 229, creatinine 1.23, sodium 115, potassium 2.6.  She tested positive for COVID. Chest x-ray revealed right middle lobe collapse and moderate pleural effusion.   started remdisivir and ceftriaxone and azithromycin She was seen by pulmonologist.  Recommended thoracentesis.  And antibiotics were changed to Unasyn.  Because of concern for aspiration pneumonia and empyema.  Thoracentesis was yielded fluid which had an LD of 7298, WBC of 121,858 with 95% neutrophils.  Culture is pending.  I am asked to see the patient for further management.  Past Medical History:  Diagnosis Date  . Abnormal Pap smear    Age 49  . 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 Alcohol abuse   . Anxiety   . Bacterial infection   . Cervical intraepithelial neoplasia III   . 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 Condyloma 2011  . H/O fatigue 2009  . H/O varicella   . Headache(784.0)   . HPV (human papilloma virus) anogenital infection   . Hx of dizziness 09/20/2011  . Hx: UTI (urinary tract infection)    Back pain  . Irregular menstrual cycle   . IV drug user   .  Palpitations 2010  . Pelvic pain in female 12/05/11  . 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 Trichomonas   . Yeast infection     Past Surgical History:  Procedure Laterality Date  . A*wisdom teeth ext    . CERVICAL CONIZATION W/BX  11/30/2011   Procedure: CONIZATION CERVIX WITH BIOPSY;  Surgeon: [name], MD;  Location: [facility];  Service: Gynecology;  Laterality: N/A;  . CERVICAL CONIZATION W/BX  03/16/2012   Procedure: CONIZATION CERVIX WITH BIOPSY;  Surgeon: Ena Dawley, MD;  Location: [facility];  Service: Gynecology;  Laterality: N/A;  . CONIZATION CERVIX     x 3  . DILATION AND CURETTAGE OF UTERUS  11/30/2011   Procedure: DILATATION AND CURETTAGE;  Surgeon: [name], MD;  Location: [facility];  Service: Gynecology;  Laterality: N/A;  . svd     x 1    Social History   Socioeconomic History  . Marital status: Divorced    Spouse name: Not on file  . Number of children: 1  . Years of education: Not on file  . Highest education level: Master's degree (e.g., MA, MS, MEng, MEd, MSW, MBA)  Occupational History  . Occupation: unemployed  Tobacco Use  . Smoking status: Current Every Day Smoker    Years: 30.00    Types: Cigarettes  . Smokeless tobacco: Former Systems developer  . Tobacco comment: less than 8 a day  Vaping Use  . Vaping Use: Never used  Substance and Sexual Activity  . Alcohol use: Yes    Alcohol/week: 8.0 - 10.0 standard drinks    Types: 8 - 10 Cans of beer per week    Comment: every day after work   . 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 Sexual activity: Not Currently    Birth control/protection: Abstinence  Other Topics Concern  . Not on file  Social History Narrative   Work or School: clinical addiction specialist      Home Situation: lives with daughter and wife      Spiritual Beliefs: none      Lifestyle: no regular exercise; good diet          On Food stamps   Social Determinants of  Health   Financial Resource Strain: Not on file  Food Insecurity: Not on file  Transportation Needs: Not on file  Physical Activity: Not on file  Stress: Not on file  Social Connections: Not on file  Intimate Partner Violence: Not on file    Family History  Problem Relation Age of Onset  . Cervical cancer Mother   . Breast cancer Mother   . Cancer Mother 31       Breast & cervical   . Stroke Maternal Grandmother   . COPD Maternal Grandfather        Emphysema  . Hypertension Paternal Grandfather   . Stroke Paternal Grandfather   . Cancer Father 35       prostate   No Known Allergies  ? Current Facility-Administered Medications  Medication Dose Route Frequency Provider Last Rate Last Admin  . acetaminophen (TYLENOL) tablet 325 mg  325 mg Oral Q6H PRN Cox, Amy N, DO      . Ampicillin-Sulbactam (UNASYN) 3 g in sodium chloride 0.9 % 100 mL IVPB  3 g Intravenous Q6H Wyvonnia Dusky, MD   Stopped at 01/06/21 1101  . dexamethasone (DECADRON) injection 6 mg  6 mg Intravenous Q24H Cox, Amy N, DO   6 mg at 01/06/21 1012  . enoxaparin (LOVENOX) injection 40 mg  40 mg Subcutaneous Q24H Cox, Amy N, DO   40 mg at 01/06/21 0533  . folic acid (FOLVITE) tablet 1 mg  1 mg Oral Daily Cox, Amy N, DO   1 mg at 01/06/21 1012  . LORazepam (ATIVAN) tablet 1-4 mg  1-4 mg Oral Q1H PRN Cox, Amy N, DO       Or  . LORazepam (ATIVAN) injection 1-4 mg  1-4 mg Intravenous Q1H PRN Cox, Amy N, DO      . midodrine (PROAMATINE) tablet 5 mg  5 mg Oral BID WC Cox, Amy N, DO   5 mg at 01/06/21 1012  . multivitamin with minerals tablet 1 tablet  1 tablet Oral Daily Cox, Amy N, DO   1 tablet at 01/06/21 1012  . nicotine (NICODERM CQ - dosed in mg/24 hours) patch 21 mg  21 mg Transdermal Once Wyvonnia Dusky, MD   21 mg at 01/05/21 1722  . nicotine (NICODERM CQ - dosed in mg/24 hours) patch 21 mg  21 mg Transdermal Daily Max Sane, MD   21 mg at 01/06/21 1012  . ondansetron (ZOFRAN) tablet 4 mg  4 mg Oral  Q6H PRN Cox, Amy N, DO       Or  . ondansetron (ZOFRAN) injection 4 mg  4 mg Intravenous Q6H PRN Cox, Amy N, DO      . potassium & sodium phosphates (PHOS-NAK) 280-160-250 MG packet 1 packet  1 packet Oral TID AC & HS Darnelle Bos, RPH   1 packet at 01/06/21 1249  . remdesivir 100 mg in sodium chloride 0.9 % 100 mL IVPB  100 mg Intravenous Daily Cox, Amy N, DO   Stopped at 01/06/21 1152  . thiamine tablet 100 mg  100 mg Oral Daily Cox, Amy N, DO   100 mg at 01/06/21 1012   Or  . thiamine (B-1) injection 100 mg  100 mg Intravenous Daily Cox, Amy N, DO   100 mg at 01/04/21 2106   Current Outpatient Medications  Medication Sig Dispense Refill  . propranolol (INDERAL) 40 MG tablet TAKE 1 TABLET(40 MG) BY MOUTH EVERY MORNING 14 tablet 0     Abtx:  Anti-infectives (From admission, onward)   Start     Dose/Rate Route Frequency Ordered Stop   01/06/21 0900  Ampicillin-Sulbactam (UNASYN) 3 g in sodium chloride 0.9 % 100 mL IVPB        3 g 200 mL/hr over 30 Minutes Intravenous Every 6 hours 01/05/21 1539     01/05/21 1000  remdesivir 100 mg in sodium chloride 0.9 % 100 mL IVPB  Status:  Discontinued       "Followed by" Linked Group Details   100 mg 200 mL/hr over 30 Minutes Intravenous Daily 01/04/21 1752 01/04/21 1835   01/05/21 1000  remdesivir 100 mg in sodium chloride 0.9 % 100 mL IVPB       "Followed by" Linked Group Details   100 mg 200 mL/hr over 30 Minutes Intravenous Daily 01/04/21 1834 01/09/21 0959   01/05/21 1000  cefTRIAXone (ROCEPHIN) 1 g in sodium chloride 0.9 % 100 mL IVPB  Status:  Discontinued        1 g 200 mL/hr over 30 Minutes Intravenous Every 24 hours 01/04/21 1835 01/05/21 1539   01/04/21 1900  remdesivir 200 mg in sodium chloride 0.9% 250 mL IVPB  Status:  Discontinued       "Followed by" Linked Group Details   200 mg 580 mL/hr over 30 Minutes Intravenous Once 01/04/21 1752 01/04/21 1835   01/04/21 1900  remdesivir 200 mg in sodium chloride 0.9% 250 mL IVPB        "Followed by" Linked Group Details   200 mg 580 mL/hr over 30 Minutes Intravenous Once 01/04/21 1834 01/04/21 2021   01/04/21 1900  azithromycin (ZITHROMAX) 500 mg in sodium chloride 0.9 % 250 mL IVPB  Status:  Discontinued        500 mg 250 mL/hr over 60 Minutes Intravenous Every 24 hours 01/04/21 1834 01/05/21 1539   01/04/21 1845  cefTRIAXone (ROCEPHIN) 1 g in sodium chloride 0.9 % 100 mL IVPB  Status:  Discontinued        1 g 200 mL/hr over 30 Minutes Intravenous Every 24 hours 01/04/21 1834 01/04/21 1835   01/04/21 1645  cefTRIAXone (ROCEPHIN) 1 g in sodium chloride 0.9 % 100 mL IVPB        1 g 200 mL/hr over 30 Minutes Intravenous  Once 01/04/21 1633 01/04/21 1744   01/04/21 1645  azithromycin (ZITHROMAX) 500 mg in sodium chloride 0.9 % 250 mL IVPB  Status:  Discontinued        500 mg 250 mL/hr over 60 Minutes Intravenous  Once 01/04/21 1633 01/04/21 1835      REVIEW OF  SYSTEMS:  Const: negative fever, negative chills, negative weight loss Eyes: negative diplopia or visual changes, negative eye pain ENT: negative coryza, negative sore throat Resp:  cough, , dyspnea Cards: negative for chest pain, has palpitations, negative lower extremity edema GU: negative for frequency, dysuria and hematuria GI: Negative for abdominal pain, diarrhea, bleeding, constipation Skin: negative for rash and pruritus Heme: negative for easy bruising and gum/nose bleeding MS: Positive for myalgias,  and muscle weakness Neurolo: Some confusion. Psych: negative for feelings of anxiety, depression  Endocrine: negative for thyroid, diabetes Allergy/Immunology- negative for any medication or food allergies ? Objective:  VITALS:  BP (!) 97/59   Pulse 77   Temp 97.7 F (36.5 C) (Oral)   Resp 18   Ht 5' (1.524 m)   Wt 65.8 kg   SpO2 94%   BMI 28.32 kg/m  PHYSICAL EXAM:  General: Awake, pale, some confusion,, cooperative, no distress,  Head: Normocephalic, without obvious abnormality,  atraumatic. Eyes: Conjunctivae clear, anicteric sclerae. Pupils are equal ENT Nares normal. No drainage or sinus tenderness. Lips, mucosa, and tongue normal. No Thrush Neck: Supple, symmetrical, no adenopathy, thyroid: non tender no carotid bruit and no JVD. Back: No CVA tenderness. Lungs: Air entry decreased on the right side.  Few crypts present. Heart: Regular rate and rhythm, no murmur, rub or gallop. Abdomen: Soft, non-tender,not distended. Bowel sounds normal. No masses Extremities: atraumatic, no cyanosis. No edema. No clubbing Skin: No rashes or lesions. Or bruising Lymph: Cervical, supraclavicular normal. Neurologic: Grossly non-focal Pertinent Labs Lab Results CBC    Component Value Date/Time   WBC 13.5 (H) 01/06/2021 0526   RBC 2.54 (L) 01/06/2021 0526   HGB 8.6 (L) 01/06/2021 0526   HGB 12.9 02/05/2020 1224   HCT 23.5 (L) 01/06/2021 0526   HCT 37.2 02/05/2020 1224   PLT 178 01/06/2021 0526   PLT 183 02/05/2020 1224   MCV 92.5 01/06/2021 0526   MCV 102 (H) 02/05/2020 1224   MCH 33.9 01/06/2021 0526   MCHC 36.6 (H) 01/06/2021 0526   RDW 13.2 01/06/2021 0526   RDW 12.9 02/05/2020 1224   LYMPHSABS 1.0 01/06/2021 0526   LYMPHSABS 1.4 02/05/2020 1224   MONOABS 0.7 01/06/2021 0526   EOSABS 0.0 01/06/2021 0526   EOSABS 0.3 02/05/2020 1224   BASOSABS 0.1 01/06/2021 0526   BASOSABS 0.0 02/05/2020 1224    CMP Latest Ref Rng & Units 01/06/2021 01/05/2021 01/05/2021  Glucose 70 - 99 mg/dL 204(H) 188(H) 128(H)  BUN 6 - 20 mg/dL 14 20 24(H)  Creatinine 0.44 - 1.00 mg/dL 0.61 0.81 1.00  Sodium 135 - 145 mmol/L 126(L) 127(L) 124(L)  Potassium 3.5 - 5.1 mmol/L 3.4(L) 3.9 3.8  Chloride 98 - 111 mmol/L 88(L) 87(L) 82(L)  CO2 22 - 32 mmol/L 28 24 23   Calcium 8.9 - 10.3 mg/dL 8.0(L) 9.0 7.4(L)  Total Protein 6.5 - 8.1 g/dL - - -  Total Bilirubin 0.3 - 1.2 mg/dL - - -  Alkaline Phos 38 - 126 U/L - - -  AST 15 - 41 U/L - - -  ALT 0 - 44 U/L - - -       Microbiology: Recent Results (from the past 240 hour(s))  Resp Panel by RT-PCR (Flu A&B, Covid) Nasopharyngeal Swab     Status: Abnormal   Collection Time: 01/04/21  3:40 PM   Specimen: Nasopharyngeal Swab; Nasopharyngeal(NP) swabs in vial transport medium  Result Value Ref Range Status   SARS Coronavirus 2 by RT PCR POSITIVE (A)  NEGATIVE Final    Comment: RESULT CALLED TO, READ BACK BY AND VERIFIED WITH: CANDICE KIO 01/04/21 AT [id number] BY ACR (NOTE) SARS-CoV-2 target nucleic acids are DETECTED.  The SARS-CoV-2 RNA is generally detectable in upper respiratory specimens during the acute phase of infection. Positive results are indicative of the presence of the identified virus, but do not rule out bacterial infection or co-infection with other pathogens not detected by the test. Clinical correlation with patient history and other diagnostic information is necessary to determine patient infection status. The expected result is Negative.  Fact Sheet for Patients: EntrepreneurPulse.com.au  Fact Sheet for Healthcare Providers: IncredibleEmployment.be  This test is not yet approved or cleared by the [place] FDA and  has been authorized for detection and/or diagnosis of SARS-CoV-2 by FDA under an Emergency Use Authorization (EUA).  This EUA will remain in effect (meaning this test can b e used) for the duration of  the COVID-19 declaration under Section 564(b)(1) of the Act, 21 U.S.C. section 360bbb-3(b)(1), unless the authorization is terminated or revoked sooner.     Influenza A by PCR NEGATIVE NEGATIVE Final   Influenza B by PCR NEGATIVE NEGATIVE Final    Comment: (NOTE) The Xpert Xpress SARS-CoV-2/FLU/RSV plus assay is intended as an aid in the diagnosis of influenza from Nasopharyngeal swab specimens and should not be used as a sole basis for treatment. Nasal washings and aspirates are unacceptable for Xpert Xpress  SARS-CoV-2/FLU/RSV testing.  Fact Sheet for Patients: EntrepreneurPulse.com.au  Fact Sheet for Healthcare Providers: IncredibleEmployment.be  This test is not yet approved or cleared by the [place] FDA and has been authorized for detection and/or diagnosis of SARS-CoV-2 by FDA under an Emergency Use Authorization (EUA). This EUA will remain in effect (meaning this test can be used) for the duration of the COVID-19 declaration under Section 564(b)(1) of the Act, 21 U.S.C. section 360bbb-3(b)(1), unless the authorization is terminated or revoked.  Performed at [facility], [address] [place], [place] [address]   Blood culture (routine x 2)     Status: None (Preliminary result)   Collection Time: 01/04/21  3:45 PM   Specimen: BLOOD  Result Value Ref Range Status   Specimen Description BLOOD BLOOD RIGHT HAND  Final   Special Requests   Final    BOTTLES DRAWN AEROBIC AND ANAEROBIC Blood Culture adequate volume   Culture   Final    NO GROWTH 2 DAYS Performed at [facility], [address], [place], [place] [address]    Report Status PENDING  Incomplete  Blood culture (routine x 2)     Status: None (Preliminary result)   Collection Time: 01/04/21  3:53 PM   Specimen: BLOOD  Result Value Ref Range Status   Specimen Description BLOOD LEFT ANTECUBITAL  Final   Special Requests   Final    BOTTLES DRAWN AEROBIC AND ANAEROBIC Blood Culture adequate volume   Culture   Final    NO GROWTH 2 DAYS Performed at [facility], [address], [place], [place] [address]    Report Status PENDING  Incomplete  Body fluid culture     Status: None (Preliminary result)   Collection Time: 01/05/21  3:55 PM   Specimen: PATH Cytology Pleural fluid  Result Value Ref Range Status   Specimen Description   Final    PLEURAL Performed at [facility], [address], [place], [place] [address]    Special Requests    Final    PLEURAL  Performed at [facility], [address] [place], [place] [address]    Gram Stain   Final    ABUNDANT WBC PRESENT, PREDOMINANTLY PMN NO ORGANISMS SEEN    Culture   Final    NO GROWTH < 12 HOURS Performed at [facility] [address], [place], [place] [address]    Report Status PENDING  Incomplete    IMAGING RESULTS: CT head showed mild atrophy for age.  Otherwise no acute findings. CT chest with contrast showed Complete collapse of the right middle lobe and partial collapse of the right lower lobe.  Within the collapsed right middle lobe several rounded hypodensities are noted.  No cavitary lesion.  Passive atelectasis of the right upper lobe.  Couple scattered peribronchovascular groundglass opacities within the left upper lobe.  Small to moderate volume pleural effusion on the right side.   I have personally reviewed the films ? Impression/Recommendation Patient with COVID-19 illness with acute hypoxic respiratory failure secondary to bilateral multifocal pneumonia. Patient is getting remdesivir. Also on Decadron.  Right middle lobe collapse and lower lobe collapse with right pleural effusion.  Thoracentesis revealed to be empyema.  Likely this is aspiration pneumonia with empyema.  She needs chest tube placement versus decortication.  Await culture result She is on Unasyn.  Encephalopathy secondary to hyponatremia. Is being corrected slowly  Hyponatremia likely due to alcohol abuse and pulmonary infection  AKI has resolved Hypokalemia has been corrected Transaminitis on admission.  AST more than ALT.  Could be from alcohol use ?We will get hepatitis panel We will get ultrasound of the liver to look for steatohepatitis/cirrhosis  ?Discussed the management with the care team.  Note:  This document was prepared using Dragon voice recognition software and may include unintentional dictation errors.

## 2021-01-06 NOTE — ED Notes (Signed)
[name], pt verbalized permission to share information about her care with this person

## 2021-01-06 NOTE — ED Notes (Signed)
D5 not hanging on assumption of care

## 2021-01-06 NOTE — ED Notes (Addendum)
MD Sharion Settler notified about pt hemoglobin being 8.6

## 2021-01-07 ENCOUNTER — Inpatient Hospital Stay: Payer: Self-pay

## 2021-01-07 DIAGNOSIS — J690 Pneumonitis due to inhalation of food and vomit: Secondary | ICD-10-CM

## 2021-01-07 DIAGNOSIS — U071 COVID-19: Secondary | ICD-10-CM

## 2021-01-07 DIAGNOSIS — J869 Pyothorax without fistula: Secondary | ICD-10-CM | POA: Diagnosis not present

## 2021-01-07 DIAGNOSIS — F1010 Alcohol abuse, uncomplicated: Secondary | ICD-10-CM | POA: Diagnosis not present

## 2021-01-07 DIAGNOSIS — N179 Acute kidney failure, unspecified: Secondary | ICD-10-CM

## 2021-01-07 DIAGNOSIS — G9340 Encephalopathy, unspecified: Secondary | ICD-10-CM

## 2021-01-07 DIAGNOSIS — R899 Unspecified abnormal finding in specimens from other organs, systems and tissues: Secondary | ICD-10-CM

## 2021-01-07 DIAGNOSIS — B955 Unspecified streptococcus as the cause of diseases classified elsewhere: Secondary | ICD-10-CM

## 2021-01-07 DIAGNOSIS — E876 Hypokalemia: Secondary | ICD-10-CM

## 2021-01-07 DIAGNOSIS — F419 Anxiety disorder, unspecified: Secondary | ICD-10-CM | POA: Diagnosis not present

## 2021-01-07 DIAGNOSIS — R7401 Elevation of levels of liver transaminase levels: Secondary | ICD-10-CM

## 2021-01-07 DIAGNOSIS — R002 Palpitations: Secondary | ICD-10-CM

## 2021-01-07 DIAGNOSIS — J9601 Acute respiratory failure with hypoxia: Secondary | ICD-10-CM | POA: Diagnosis not present

## 2021-01-07 DIAGNOSIS — E871 Hypo-osmolality and hyponatremia: Secondary | ICD-10-CM

## 2021-01-07 DIAGNOSIS — K760 Fatty (change of) liver, not elsewhere classified: Secondary | ICD-10-CM

## 2021-01-07 LAB — BASIC METABOLIC PANEL
Anion gap: 11 (ref 5–15)
BUN: 9 mg/dL (ref 6–20)
CO2: 31 mmol/L (ref 22–32)
Calcium: 7.6 mg/dL — ABNORMAL LOW (ref 8.9–10.3)
Chloride: 89 mmol/L — ABNORMAL LOW (ref 98–111)
Creatinine, Ser: 0.56 mg/dL (ref 0.44–1.00)
GFR, Estimated: >60 mL/min (ref >60)
Glucose, Bld: 100 mg/dL — ABNORMAL HIGH (ref 70–99)
Potassium: 3.2 mmol/L — ABNORMAL LOW (ref 3.5–5.1)
Sodium: 131 mmol/L — ABNORMAL LOW (ref 135–145)

## 2021-01-07 LAB — HEPATITIS PANEL, ACUTE
HCV Ab: REACTIVE — AB
Hep A IgM: NONREACTIVE
Hep B C IgM: NONREACTIVE
Hepatitis B Surface Ag: NONREACTIVE

## 2021-01-07 LAB — CBC WITH DIFFERENTIAL/PLATELET
Abs Immature Granulocytes: 0.69 10*3/uL — ABNORMAL HIGH (ref 0.00–0.07)
Basophils Absolute: 0.1 10*3/uL (ref 0.0–0.1)
Basophils Relative: 1 %
Eosinophils Absolute: 0 10*3/uL (ref 0.0–0.5)
Eosinophils Relative: 0 %
HCT: 22 % — ABNORMAL LOW (ref 36.0–46.0)
Hemoglobin: 8 g/dL — ABNORMAL LOW (ref 12.0–15.0)
Immature Granulocytes: 8 %
Lymphocytes Relative: 16 %
Lymphs Abs: 1.5 10*3/uL (ref 0.7–4.0)
MCH: 34.5 pg — ABNORMAL HIGH (ref 26.0–34.0)
MCHC: 36.4 g/dL — ABNORMAL HIGH (ref 30.0–36.0)
MCV: 94.8 fL (ref 80.0–100.0)
Monocytes Absolute: 0.7 10*3/uL (ref 0.1–1.0)
Monocytes Relative: 8 %
Neutro Abs: 6.1 10*3/uL (ref 1.7–7.7)
Neutrophils Relative %: 67 %
Platelets: 163 10*3/uL (ref 150–400)
RBC: 2.32 MIL/uL — ABNORMAL LOW (ref 3.87–5.11)
RDW: 13.6 % (ref 11.5–15.5)
Smear Review: NORMAL
WBC: 9.1 10*3/uL (ref 4.0–10.5)
nRBC: 0.8 % — ABNORMAL HIGH (ref 0.0–0.2)

## 2021-01-07 LAB — COMP PANEL: LEUKEMIA/LYMPHOMA

## 2021-01-07 LAB — D-DIMER, QUANTITATIVE: D-Dimer, Quant: 1.86 ug/mL-FEU — ABNORMAL HIGH (ref 0.00–0.50)

## 2021-01-07 LAB — PH, BODY FLUID: pH, Body Fluid: 6.9

## 2021-01-07 LAB — MAGNESIUM: Magnesium: 1.5 mg/dL — ABNORMAL LOW (ref 1.7–2.4)

## 2021-01-07 LAB — PHOSPHORUS: Phosphorus: 2.9 mg/dL (ref 2.5–4.6)

## 2021-01-07 LAB — ACID FAST SMEAR (AFB, MYCOBACTERIA): Acid Fast Smear: NEGATIVE

## 2021-01-07 LAB — LEGIONELLA PNEUMOPHILA SEROGP 1 UR AG: L. pneumophila Serogp 1 Ur Ag: NEGATIVE

## 2021-01-07 LAB — C-REACTIVE PROTEIN: CRP: 9.3 mg/dL — ABNORMAL HIGH (ref <1.0)

## 2021-01-07 MED ORDER — POTASSIUM CHLORIDE CRYS ER 20 MEQ PO TBCR
40.0000 meq | EXTENDED_RELEASE_TABLET | ORAL | Status: AC
  Administered 2021-01-07: 40 meq via ORAL
  Filled 2021-01-07: qty 2

## 2021-01-07 MED ORDER — ENOXAPARIN SODIUM 40 MG/0.4ML ~~LOC~~ SOLN: 40.0000 mg | SUBCUTANEOUS | Status: DC

## 2021-01-07 MED ORDER — MAGNESIUM SULFATE 4 GM/100ML IV SOLN
4.0000 g | Freq: Once | INTRAVENOUS | Status: AC
  Administered 2021-01-07: 4 g via INTRAVENOUS
  Filled 2021-01-07: qty 100

## 2021-01-07 MED ORDER — DEXAMETHASONE SODIUM PHOSPHATE 10 MG/ML IJ SOLN: 6.0000 mg | INTRAMUSCULAR | 0 refills | Status: DC

## 2021-01-07 MED ORDER — IBUPROFEN 400 MG PO TABS
400.0000 mg | ORAL_TABLET | Freq: Four times a day (QID) | ORAL | Status: DC | PRN
  Administered 2021-01-08 – 2021-01-09 (×3): 400 mg via ORAL
  Filled 2021-01-07 (×3): qty 1

## 2021-01-07 MED ORDER — MIDODRINE HCL 5 MG PO TABS: 5.0000 mg | ORAL_TABLET | Freq: Two times a day (BID) | ORAL | Status: DC

## 2021-01-07 NOTE — Progress Notes (Signed)
Pulmonary Medicine          Date: 01/07/2021,   MRN# [id number] [name] 12/23/1975     AdmissionWeight: 65.8 kg                 CurrentWeight: 65.8 kg   Referring physician: Dr Jimmye Norman  CHIEF COMPLAINT:   Atelectasis in the context of COVID-19 lower respiratory tract infection.   HISTORY OF PRESENT ILLNESS   As per admission h/p [name] is a 45 y.o. female with medical history significant for palpitations on propanolol, daily alcohol abuse, atopic dermatitis, history of cervical cancer, presents emergency department for chief concerns of weakness and difficulty with balance. She reports she has been feeling weak for about 2 weeks. She denies changes to diet. She endorses a new cough for 1 week. She states she is experiencing shortness of breath both at rest and with activity.  She denies fever, chest pain, abdominal pain, dysuria, hematuria.  She endorses myalgia.  She reports she has never felt this way before.  She endorses feeling fatigue and dizziness.  She states that she consumes approximately 8-10 beers per day.  She endorses tremors when she does not drink EtOH.  Blood work is with mild leukocytosis and chronic anemia.  BMP with chronic electrolyte abnormalities including hyponatremia, hypokalemia hypochloremia, hyperglycemia AKI.  Rapid PCR for COVID is +1-day ago on January 04, 2021.  CT chest with right lung compressive atelectasis secondary to moderate pleural effusion with pulmonary consultation placed for additional evaluation and management. Patient is in no distress and asks how long will she need to stay in hospital.  Met with attending physician after evaluation and reviewed short term care plan.          01/07/21- patient is resting in bed.  RN at bedside states patient has been out of bed and got up to urinate however went on the floor instead of commode.  Vital signs are stable during my evaluation with only mild rhonchi at right  chest.  She is on 2L/min [place].   Unclear microbiology results since one states bacteria is present but next one states no organisms and negative gram stain.      PAST MEDICAL HISTORY   Past Medical History:  Diagnosis Date  . Abnormal Pap smear    Age 64  . 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 Alcohol abuse   . Anxiety   . Bacterial infection   . Cervical intraepithelial neoplasia III   . 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 Condyloma 2011  . H/O fatigue 2009  . H/O varicella   . Headache(784.0)   . HPV (human papilloma virus) anogenital infection   . Hx of dizziness 09/20/2011  . Hx: UTI (urinary tract infection)    Back pain  . Irregular menstrual cycle   . IV drug user   . Palpitations 2010  . Pelvic pain in female 12/05/11  . 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    Age 64  . Trichomonas   . Yeast infection      SURGICAL HISTORY   Past Surgical History:  Procedure Laterality Date  . A*wisdom teeth ext    . CERVICAL CONIZATION W/BX  11/30/2011   Procedure: CONIZATION CERVIX WITH BIOPSY;  Surgeon: [name], MD;  Location: [facility];  Service: Gynecology;  Laterality: N/A;  . CERVICAL CONIZATION W/BX  03/16/2012   Procedure: CONIZATION CERVIX WITH BIOPSY;  Surgeon: Ena Dawley, MD;  Location: [facility];  Service: Gynecology;  Laterality: N/A;  . CONIZATION CERVIX     x 3  . DILATION AND CURETTAGE OF UTERUS  11/30/2011   Procedure: DILATATION AND CURETTAGE;  Surgeon: [name], MD;  Location: [facility];  Service: Gynecology;  Laterality: N/A;  . svd     x 1     FAMILY HISTORY   Family History  Problem Relation Age of Onset  . Cervical cancer Mother   . Breast cancer Mother   . Cancer Mother 27       Breast & cervical   . Stroke Maternal Grandmother   . COPD Maternal Grandfather        Emphysema  . Hypertension Paternal Grandfather   . Stroke Paternal Grandfather   . Cancer Father 38        prostate     SOCIAL HISTORY   Social History   Tobacco Use  . Smoking status: Current Every Day Smoker    Years: 30.00    Types: Cigarettes  . Smokeless tobacco: Former Systems developer  . Tobacco comment: less than 8 a day  Vaping Use  . Vaping Use: Never used  Substance Use Topics  . Alcohol use: Yes    Alcohol/week: 8.0 - 10.0 standard drinks    Types: 8 - 10 Cans of beer per week    Comment: every day after work   . Drug use: Not Currently    Types: Heroin    Comment: Abused rx drugs - on methadone 08/2011     MEDICATIONS    Home Medication:  Current Outpatient Rx  . Order #: [id number] Class: Normal    Current Medication:  Current Facility-Administered Medications:  .  acetaminophen (TYLENOL) tablet 325 mg, 325 mg, Oral, Q6H PRN, Cox, Amy N, DO .  Ampicillin-Sulbactam (UNASYN) 3 g in sodium chloride 0.9 % 100 mL IVPB, 3 g, Intravenous, Q6H, Wyvonnia Dusky, MD, Stopped at 01/07/21 0351 .  dexamethasone (DECADRON) injection 6 mg, 6 mg, Intravenous, Q24H, Cox, Amy N, DO, 6 mg at 01/06/21 1012 .  enoxaparin (LOVENOX) injection 40 mg, 40 mg, Subcutaneous, Q24H, Cox, Amy N, DO, 40 mg at 22/97/98 9211 .  folic acid (FOLVITE) tablet 1 mg, 1 mg, Oral, Daily, Cox, Amy N, DO, 1 mg at 01/06/21 1012 .  LORazepam (ATIVAN) tablet 1-4 mg, 1-4 mg, Oral, Q1H PRN, 1 mg at 01/06/21 1831 **OR** LORazepam (ATIVAN) injection 1-4 mg, 1-4 mg, Intravenous, Q1H PRN, Cox, Amy N, DO .  magnesium sulfate IVPB 4 g 100 mL, 4 g, Intravenous, Once, Lu Duffel, RPH, Last Rate: 50 mL/hr at 01/07/21 0900, 4 g at 01/07/21 0900 .  midodrine (PROAMATINE) tablet 5 mg, 5 mg, Oral, BID WC, Cox, Amy N, DO, 5 mg at 01/06/21 1825 .  multivitamin with minerals tablet 1 tablet, 1 tablet, Oral, Daily, Cox, Amy N, DO, 1 tablet at 01/06/21 1012 .  nicotine (NICODERM CQ - dosed in mg/24 hours) patch 21 mg, 21 mg, Transdermal, Daily, Max Sane, MD, 21 mg at 01/06/21 1825 .  ondansetron (ZOFRAN) tablet 4 mg, 4 mg,  Oral, Q6H PRN **OR** ondansetron (ZOFRAN) injection 4 mg, 4 mg, Intravenous, Q6H PRN, Cox, Amy N, DO .  potassium chloride SA (KLOR-CON) CR tablet 40 mEq, 40 mEq, Oral, Q3H, Shanlever, Pierce Crane, RPH .  [COMPLETED] remdesivir 200 mg in sodium chloride 0.9% 250 mL IVPB, 200 mg, Intravenous, Once, Stopped at 01/04/21 2021 **FOLLOWED BY** remdesivir 100  mg in sodium chloride 0.9 % 100 mL IVPB, 100 mg, Intravenous, Daily, Cox, Amy N, DO, Stopped at 01/06/21 1152 .  thiamine tablet 100 mg, 100 mg, Oral, Daily, 100 mg at 01/06/21 1012 **OR** thiamine (B-1) injection 100 mg, 100 mg, Intravenous, Daily, Cox, Amy N, DO, 100 mg at 01/04/21 2106  Current Outpatient Medications:  .  propranolol (INDERAL) 40 MG tablet, TAKE 1 TABLET(40 MG) BY MOUTH EVERY MORNING, Disp: 14 tablet, Rfl: 0    ALLERGIES   Patient has no known allergies.     REVIEW OF SYSTEMS    Review of Systems:  Gen:  Denies  fever, sweats, chills weigh loss  HEENT: Denies blurred vision, double vision, ear pain, eye pain, hearing loss, nose bleeds, sore throat Cardiac:  No dizziness, chest pain or heaviness, chest tightness,edema Resp:   Denies cough or sputum porduction, shortness of breath,wheezing, hemoptysis,  Gi: Denies swallowing difficulty, stomach pain, nausea or vomiting, diarrhea, constipation, bowel incontinence Gu:  Denies bladder incontinence, burning urine Ext:   Denies Joint pain, stiffness or swelling Skin: Denies  skin rash, easy bruising or bleeding or hives Endoc:  Denies polyuria, polydipsia , polyphagia or weight change Psych:   Denies depression, insomnia or hallucinations   Other:  All other systems negative   VS: BP 95/65   Pulse 74   Temp 97.7 F (36.5 C) (Oral)   Resp 11   Ht 5' (1.524 m)   Wt 65.8 kg   SpO2 100%   BMI 28.32 kg/m      PHYSICAL EXAM    GENERAL:NAD, no fevers, chills, no weakness no fatigue HEAD: Normocephalic, atraumatic.  EYES: Pupils equal, round, reactive to light.  Extraocular muscles intact. No scleral icterus.  MOUTH: Moist mucosal membrane. Dentition intact. No abscess noted.  EAR, NOSE, THROAT: Clear without exudates. No external lesions.  NECK: Supple. No thyromegaly. No nodules. No JVD.  PULMONARY: mild rhonchi bilaterally  CARDIOVASCULAR: S1 and S2. Regular rate and rhythm. No murmurs, rubs, or gallops. No edema. Pedal pulses 2+ bilaterally.  GASTROINTESTINAL: Soft, nontender, nondistended. No masses. Positive bowel sounds. No hepatosplenomegaly.  MUSCULOSKELETAL: No swelling, clubbing, or edema. Range of motion full in all extremities.  NEUROLOGIC: Cranial nerves II through XII are intact. No gross focal neurological deficits. Sensation intact. Reflexes intact.  SKIN: No ulceration, lesions, rashes, or cyanosis. Skin warm and dry. Turgor intact.  PSYCHIATRIC: Mood, affect within normal limits. The patient is awake, alert and oriented x 3. Insight, judgment intact.       IMAGING    CT HEAD WO CONTRAST  Result Date: 01/04/2021 CLINICAL DATA:  Right-sided weakness EXAM: CT HEAD WITHOUT CONTRAST TECHNIQUE: Contiguous axial images were obtained from the base of the skull through the vertex without intravenous contrast. COMPARISON:  None. FINDINGS: Brain: There is mild diffuse atrophy for age. Prominence of the cisterna magna is an anatomic variant. There is no intracranial mass, hemorrhage, extra-axial fluid collection, or midline shift. Brain parenchyma appears unremarkable; no acute infarct is demonstrable. Vascular: No hyperdense vessel. No appreciable vascular calcification. Skull: Bony calvarium appears intact. Sinuses/Orbits: Visualized paranasal sinuses are clear. Visualized orbits appear symmetric bilaterally. Other: Mastoid air cells are clear. IMPRESSION: Mild atrophy for age. Brain parenchyma appears unremarkable. No mass or hemorrhage. Electronically Signed   By: Lowella Grip III M.D.   On: 01/04/2021 14:05   CT Chest W  Contrast  Result Date: 01/04/2021 CLINICAL DATA:  Right-sided pleural effusion, shortness of breath, leukocytosis. EXAM: CT CHEST WITH CONTRAST  TECHNIQUE: Multidetector CT imaging of the chest was performed during intravenous contrast administration. CONTRAST:  51m OMNIPAQUE IOHEXOL 300 MG/ML  SOLN COMPARISON:  Chest x-ray 01/04/2021, CT abdomen pelvis 08/15/2018. FINDINGS: Cardiovascular: Normal heart size. No significant pericardial effusion. The thoracic aorta is normal in caliber. No atherosclerotic plaque of the thoracic aorta. No coronary artery calcifications. Mediastinum/Nodes: Prominent infra cardiac lymph nodes measuring up to 0.6 cm (2:110). No enlarged mediastinal, hilar, or axillary lymph nodes. Thyroid gland, trachea, and esophagus demonstrate no significant findings. Lungs/Pleura: Complete collapse of the right middle lobe and partial collapse of the right lower lobe. Within the collapsed right middle lobe, several rounded hypodensities are noted (2:90). No cavitary lesion identified. No associated surrounding enhancing/thick-walled capsule. Nonspecific calcification within the collapsed right lower lobe. Passive atelectasis of the right upper lobe. Couple scattered peribronchovascular ground-glass airspace opacities within the left upper lobe. Suggestion of round atelectasis at the left base (2:123). Small to moderate volume right pleural effusion. No definite pleural thickening or enhancement to suggest empyema. No pleural effusion on the left. No pneumothorax bilaterally. Upper Abdomen: The hepatic parenchyma is diffusely hypodense compared to the splenic parenchyma consistent with fatty infiltration. Otherwise no acute abnormality. Musculoskeletal: No chest wall abnormality. No acute or significant osseous findings. IMPRESSION: 1. Small to moderate right pleural effusion with associated complete collapse of right middle lobe and partial collapse of right lower lobe. Several rounded  hypodensities within the collapsed right middle lobe likely represents infection/inflammation with no definite findings to suggest abscess formation. Recommend short-term repeat CT chest with intravenous contrast for evaluation of resolution and exclude underlying malignancy. 2. Few scattered ground-glass airspace opacities within the of the left lower lobe could represent atelectasis versus infection/inflammation. Electronically Signed   By: MIven FinnM.D.   On: 01/04/2021 17:28   DG Chest Port 1 View  Result Date: 01/06/2021 CLINICAL DATA:  Post thoracentesis EXAM: PORTABLE CHEST 1 VIEW COMPARISON:  01/05/2021 FINDINGS: Similar moderate right pleural effusion with small pneumothorax. Adjacent right lung atelectasis. Stable left lung aeration. Stable cardiomediastinal contours. IMPRESSION: Similar right hydropneumothorax. Electronically Signed   By: PMacy MisM.D.   On: 01/06/2021 08:15   DG Chest Port 1 View  Result Date: 01/05/2021 CLINICAL DATA:  Status post thoracentesis. EXAM: PORTABLE CHEST 1 VIEW COMPARISON:  Chest x-ray 01/04/2021 FINDINGS: Status post right-sided thoracentesis with a evacuation of a moderate amount of pleural fluid. There is still a moderate-sized, likely loculated pleural effusion. There is a small postprocedural pneumothorax noted. The left lung remains relatively clear. IMPRESSION: Status post right-sided thoracentesis with residual loculated pleural fluid and a small postprocedural pneumothorax. Electronically Signed   By: PMarijo SanesM.D.   On: 01/05/2021 16:32   DG Chest Portable 1 View  Result Date: 01/04/2021 CLINICAL DATA:  Weakness EXAM: PORTABLE CHEST 1 VIEW COMPARISON:  11/17/2014 FINDINGS: Minimal airspace disease at the peripheral left lung base. At least moderate size right pleural effusion which appears loculated. Consolidation at the right middle lobe and right base. Cardiomediastinal silhouette is obscured. No pneumothorax. IMPRESSION: 1. At least  moderate size right pleural effusion which appears loculated. Consolidation at the right middle lobe and right base may reflect atelectasis or pneumonia. 2. Minimal airspace disease at the peripheral left lung base. Electronically Signed   By: KDonavan FoilM.D.   On: 01/04/2021 16:20   UKoreaAbdomen Limited RUQ (LIVER/GB)  Result Date: 01/07/2021 CLINICAL DATA:  Transaminitis, steatohepatitis EXAM: ULTRASOUND ABDOMEN LIMITED RIGHT UPPER QUADRANT COMPARISON:  Ultrasound 07/22/2014, CT  08/15/2018 FINDINGS: Gallbladder: Top-normal gallbladder distension. No gallbladder wall thickening. Gallbladder contains some echogenic biliary sludge and few echogenic, shadowing gallstones measuring up to 3.7 mm in diameter. Sonographic Percell Miller sign is reportedly negative. Common bile duct: Diameter: 6 mm, upper limits normal. Liver: Diffusely increased hepatic echogenicity with loss of definition of the portal triads and diminished posterior through transmission compatible with hepatic steatosis. Lobular hepatic surface contours could reflect some developing cirrhotic changes as well. No visible hepatic lesions. No intrahepatic biliary ductal dilatation is evident. Portal vein is patent on color Doppler imaging with normal direction of blood flow towards the liver. Other: Small volume perihepatic ascites and a right pleural effusion noted. IMPRESSION: 1. Diffusely increased hepatic echogenicity with loss of definition of the portal triads and diminished posterior through transmission. Findings are compatible with hepatic steatosis. 2. Lobular hepatic surface contours may reflect some developing cirrhotic changes. Particularly with small volume of perihepatic ascites. 3. Right pleural effusion. 4. Cholelithiasis and biliary sludge without sonographic evidence of acute cholecystitis. Electronically Signed   By: Lovena Le M.D.   On: 01/07/2021 02:18   US THORACENTESIS ASP PLEURAL SPACE W/IMG GUIDE  Result Date:  01/05/2021 INDICATION: Patient with history of palpitations, alcohol abuse, cervical cancer, presented to ED with weakness and shortness of breath. Request is for therapeutic and diagnostic right-sided thoracentesis EXAM: ULTRASOUND GUIDED THERAPEUTIC AND DIAGNOSTIC RIGHT SIDED THORACENTESIS MEDICATIONS: Lidocaine 1% 10 mL COMPLICATIONS: None immediate. PROCEDURE: An ultrasound guided thoracentesis was thoroughly discussed with the patient and questions answered. The benefits, risks, alternatives and complications were also discussed. The patient understands and wishes to proceed with the procedure. Written consent was obtained. Ultrasound was performed to localize and mark an adequate pocket of fluid in the right chest. The area was then prepped and draped in the normal sterile fashion. 1% Lidocaine was used for local anesthesia. Under ultrasound guidance a 6 Fr Safe-T-Centesis catheter was introduced. Thoracentesis was performed. The catheter was removed and a dressing applied. FINDINGS: A total of approximately 200 mL of thick yellow fluid was removed. The procedure was terminated due to patient's inability to sit still in decompensation of oxygen saturation rate. Samples were sent to the laboratory as requested by the clinical team. IMPRESSION: Successful ultrasound guided right-sided therapeutic and diagnostic thoracentesis yielding 200 mL of pleural fluid. Read by: Rushie Nyhan, NP Electronically Signed   By: Miachel Roux M.D.   On: 01/05/2021 16:23      ASSESSMENT/PLAN   Acute hypoxemic respiratory failure due to COVID-19 lower respiratory tract infection -Remdesevir antiviral - pharmacy protocol 5 d -vitamin C -zinc -decadron 15m IV daily  -Self prone if patient can tolerate  -encourage to use IS and Acapella device for bronchopulmonary hygiene when able -consider Actemra if CRP increments >20 -d/c hepatotoxic medications while on remdesevir -supportive care with ICU telemetry  monitoring -PT/OT when possible -procalcitonin, CRP and ferritin trending   Right moderate pleural effusion  - -[place] thoracentesis - with fluid studies - r/o empyema vs parapneumonic effusion vs malgnant effusion vs simple transudate              -unclear wheather this is infectious or not due to conflicting results on micorbiology - have asked additional input from ID- I agree this is bacterial empyema and should be evaluated by thoracic surgery for intrapleural instillation of tpa/dornase as well as decortication.                - recommend transfer to hospital with thoracic surgery  service    Right lung atelectasis   - should improve as surrounding fluid is drained   -Additionally patient should be encouraged to use incentive spirometer multiple times each hour    -Chest physiotherapy as well as occupational and physical therapy  Possible aspiration pneumonia High risk due to alcoholism history  - Currently on Zithromax and Rocephin >>> Unasyn -appreciate ID    Moderate protein calorie malnutrition  -Patient high risk for refeeding syndrome and complications during hospitalization -RD nutritional evaluation  Severe hyponatremia  -Multiple severe electrolyte derangements-see consultation   -s/p renal evaluation   Thank you for allowing me to participate in the care of this patient.   Patient/Family are satisfied with care plan and all questions have been answered.  This document was prepared using Dragon voice recognition software and may include unintentional dictation errors.     Ottie Glazier, M.D.  Division of [place]

## 2021-01-07 NOTE — Progress Notes (Signed)
Serum sodium now 131, should continue to improve, no further renal input, will sign off, please call with questions.

## 2021-01-07 NOTE — Progress Notes (Signed)
Date of Admission:  01/04/2021     ID: [name] is a 45 y.o. female  Active Problems:   Anxiety   Palpitations - evaluated by Dr. Cathie Olden in the past   Malnutrition of moderate degree ([place])   Alcohol abuse   Abnormal laboratory test result   Acute hypoxemic respiratory failure due to COVID-19 [name])    Subjective: Patient says she is better. She says she felt much better after the pleurocentesis 2 days ago. She wonders whether she will get another one. No fever   Medications:   dexamethasone (DECADRON) injection  6 mg Intravenous Q24H   enoxaparin (LOVENOX) injection  40 mg Subcutaneous Q68T   folic acid  1 mg Oral Daily   midodrine  5 mg Oral BID WC   multivitamin with minerals  1 tablet Oral Daily   nicotine  21 mg Transdermal Daily   thiamine  100 mg Oral Daily   Or   thiamine  100 mg Intravenous Daily    Objective: Vital signs in last 24 hours: Pulse Rate:  [68-102] 85 (01/20 1500) Resp:  [11-30] 19 (01/20 1500) BP: (91-107)/(47-77) 99/73 (01/20 1500) SpO2:  [88 %-100 %] 94 % (01/20 1500)  PHYSICAL EXAM:  General: Alert, cooperative, no distress, appears stated age.  Head: Normocephalic, without obvious abnormality, atraumatic. Eyes: Conjunctivae clear, anicteric sclerae. Pupils are equal ENT Nares normal. No drainage or sinus tenderness. Lips, mucosa, and tongue normal. No Thrush Neck: Supple, symmetrical, no adenopathy, thyroid: non tender no carotid bruit and no JVD. Back: No CVA tenderness. Lungs: Bilateral air entry.  Decreased right base Heart: S1-S2 Abdomen: Soft,  Extremities: atraumatic, no cyanosis. No edema. No clubbing Lymph: Cervical, supraclavicular normal. Neurologic: Grossly non-focal  Lab Results Recent Labs    01/06/21 0526 01/06/21 1011 01/07/21 0410 01/07/21 0451  WBC 13.5*  --   --  9.1  HGB 8.6*  --   --  8.0*  HCT 23.5*  --   --  22.0*  NA  --  126* 131*  --   K  --  3.4* 3.2*  --   CL  --  88* 89*  --    CO2  --  28 31  --   BUN  --  14 9  --   CREATININE  --  0.61 0.56  --    Liver Panel Recent Labs    01/06/21 1011  ALBUMIN 2.0*   Sedimentation Rate No results for input(s): ESRSEDRATE in the last 72 hours. C-Reactive Protein Recent Labs    01/06/21 0526 01/07/21 0451  CRP 18.8* 9.3*    Microbiology: 01/05/2021 Pleural fluid cultures Streptococcus intermedius. Studies/Results: DG Chest Port 1 View  Result Date: 01/06/2021 CLINICAL DATA:  Post thoracentesis EXAM: PORTABLE CHEST 1 VIEW COMPARISON:  01/05/2021 FINDINGS: Similar moderate right pleural effusion with small pneumothorax. Adjacent right lung atelectasis. Stable left lung aeration. Stable cardiomediastinal contours. IMPRESSION: Similar right hydropneumothorax. Electronically Signed   By: Macy Mis M.D.   On: 01/06/2021 08:15   US Abdomen Limited RUQ (LIVER/GB)  Result Date: 01/07/2021 CLINICAL DATA:  Transaminitis, steatohepatitis EXAM: ULTRASOUND ABDOMEN LIMITED RIGHT UPPER QUADRANT COMPARISON:  Ultrasound 07/22/2014, CT 08/15/2018 FINDINGS: Gallbladder: Top-normal gallbladder distension. No gallbladder wall thickening. Gallbladder contains some echogenic biliary sludge and few echogenic, shadowing gallstones measuring up to 3.7 mm in diameter. Sonographic Percell Miller sign is reportedly negative. Common bile duct: Diameter: 6 mm, upper limits normal. Liver: Diffusely increased hepatic echogenicity with loss of definition of the  portal triads and diminished posterior through transmission compatible with hepatic steatosis. Lobular hepatic surface contours could reflect some developing cirrhotic changes as well. No visible hepatic lesions. No intrahepatic biliary ductal dilatation is evident. Portal vein is patent on color Doppler imaging with normal direction of blood flow towards the liver. Other: Small volume perihepatic ascites and a right pleural effusion noted. IMPRESSION: 1. Diffusely increased hepatic echogenicity with  loss of definition of the portal triads and diminished posterior through transmission. Findings are compatible with hepatic steatosis. 2. Lobular hepatic surface contours may reflect some developing cirrhotic changes. Particularly with small volume of perihepatic ascites. 3. Right pleural effusion. 4. Cholelithiasis and biliary sludge without sonographic evidence of acute cholecystitis. Electronically Signed   By: Lovena Le M.D.   On: 01/07/2021 02:18     Assessment/Plan: Patient with COVID-19 illness with acute hypoxic respiratory failure.  Improved  Patient is getting remdesivir and Decadron.  Aspiration pneumonia with right empyema.  Pleural fluid cultures Streptococcus intermedius.  Patient had pleurocentesis.  She will need chest tube placement. Continue Unasyn she will need it for at least 2 to 3 weeks.  Encephalopathy secondary to hyponatremia has resolved.  AKI is resolved  Hypokalemia has been corrected  Transaminitis on admission.  Improved Ultrasound liver showed hepatic steatosis and developing cirrhotic changes. Small volume perihepatic ascites and pleural effusion also was noted Gallbladder contains some echogenic biliary sludge and few echogenic shadowing gallstones. Hepatitis C antibody positive.  Will need hep C RNA.  Discussed the management with the patient and the care team. Patient awaiting transfer to [facility].

## 2021-01-07 NOTE — Consult Note (Signed)
PHARMACY CONSULT NOTE - FOLLOW UP  Pharmacy Consult for Electrolyte Monitoring and Replacement   Recent Labs: Potassium  Date Value  01/07/2021 3.2 mmol/L (L)  07/21/2014 3.7 mEq/L   Magnesium (mg/dL)  Date Value  01/07/2021 1.5 (L)   Calcium (mg/dL)  Date Value  01/07/2021 7.6 (L)  07/21/2014 8.9   Albumin (g/dL)  Date Value  01/06/2021 2.0 (L)  02/05/2020 4.6  07/21/2014 3.2 (L)   Phosphorus (mg/dL)  Date Value  01/07/2021 2.9   Sodium  Date Value  01/07/2021 131 mmol/L (L)  02/05/2020 130 mmol/L (L)  07/21/2014 143 mEq/L     Assessment: 45yo Female with PMH of palpitations (on propranolol), daily EtOH abuse, atopic dermatitis, h/o cervical cancer presenting to ED with CC of weakness and difficulty with balance found to be Covid positive.   Pt Covid PNA with Mod Rt Pleural effusion requiring thoracentesis and Rt lung atelectasis w/ c/f possible aspiration PNA. On presentation, pt experiencing multiple electrolyte derangements.  Pharmacy consulted for the management of electrolytes.  Na: 115>121>124>127>131 K: 2.6>4.4>3.8>3.9>3.2 Phos: 3.2>1.8>2.9 Mg: 1.4>1.7>1.5 Ca: 7.0>7.4>9.0>7.6 * ~9.2 (Corrected calcium for Albumin 2.0)  Goal of Therapy:  Lytes WNL  Plan:  Na: Correcting appropriately slowly. K: 3.2 - will give 68meq KCL po x 2 Phos: 2.9 - no replenishment warranted today  Mg: 1.5 Will give 4g IV bolus x 1 Ca: WNL. No further replacement today   Lu Duffel ,PharmD Clinical Pharmacist 01/07/2021 7:51 AM

## 2021-01-07 NOTE — ED Notes (Signed)
Patients boyfriend called, patient provided with phone number, patient states she doesn't want to talk to him at this time.

## 2021-01-07 NOTE — TOC Initial Note (Signed)
Transition of Care [facility]) - Initial/Assessment Note    Patient Details  Name: [name] MRN: [id number] Date of Birth: August 27, 1976  Transition of Care [facility]) CM/SW Contact:    Ova Freshwater Phone Number: [phone] 01/07/2021, 8:19 AM  Clinical Narrative:                  Patientt presents to [facility] ED due to right side weakness.  Patient has history of daily alcohol use.  Patient stated she drinks 8-10 cans of beer everyday.  Patient lives at home with significant other/roomates.  Patient's main contact is Ronetta Molla [phone].  Patient is abel  To perform all ADLs.  Patient does not have medical insurance and does not have a PCP.     Expected Discharge Plan: [place] Barriers to Discharge: Continued Medical Work up,ED Medication assistance,ED PCP establishment,ED Active Substance Abuse,Inadequate or no insurance   Patient Goals and CMS Choice        Expected Discharge Plan and Services Expected Discharge Plan: [place] In-house Referral: Clinical Social Work     Living arrangements for the past 2 months: Apartment                                      Prior Living Arrangements/Services Living arrangements for the past 2 months: Apartment Lives with:: Significant Other Patient language and need for interpreter reviewed:: Yes Do you feel safe going back to the place where you live?: Yes      Need for Family Participation in Patient Care: No (Comment) Care giver support system in place?: No (comment)   Criminal Activity/Legal Involvement Pertinent to Current Situation/Hospitalization: No - Comment as needed  Activities of Daily Living      Permission Sought/Granted Permission sought to share information with : Family Supports    Share Information with NAME: Caliana, Spires (Father)   [phone] (Home Phone)           Emotional Assessment Appearance:: Appears stated age Attitude/Demeanor/Rapport:  Guarded Affect (typically observed): Stable Orientation: : Oriented to Self,Oriented to Place,Oriented to  Time,Oriented to Situation Alcohol / Substance Use: Alcohol Use Psych Involvement: No (comment)  Admission diagnosis:  Acute hypoxemic respiratory failure due to COVID-19 ([place]) [U07.1, J96.01] Patient Active Problem List   Diagnosis Date Noted  . Acute hypoxemic respiratory failure due to COVID-19 ([place]) 01/04/2021  . Edema of right lower leg 05/28/2020  . Abnormal laboratory test result 02/14/2020  . History of allergy 01/16/2020  . Essential hypertension 01/16/2020  . Smoking 01/16/2020  . Health care maintenance 01/08/2020  . Alcohol abuse   . Alcohol withdrawal delirium ([place]) 07/17/2015  . Ascites 07/21/2014  . Alcohol withdrawal ([place]) 07/09/2014  . Alcoholism /alcohol abuse ([place]) 07/09/2014  . Abnormal computed tomography angiography (CTA) of abdomen and pelvis 07/09/2014  . Malnutrition of moderate degree ([place]) 07/09/2014  . Liver function study, abnormal 04/08/2014  . ETOH abuse 04/08/2014  . Palpitations - evaluated by Dr. Cathie Olden in the past 02/20/2014  . Anxiety 04/02/2012  . Drug dependence ([place]) 03/18/2012  . 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

## 2021-01-07 NOTE — ED Notes (Signed)
Pt ambulatory to restroom with unsteady gait.

## 2021-01-07 NOTE — Discharge Summary (Signed)
[place] [facility] [place] NAME: [name]    MR#:  [id number]  DATE OF BIRTH:  06-Apr-1976  DATE OF ADMISSION:  01/04/2021   ADMITTING PHYSICIAN: Amy N Cox, DO  DATE OF DISCHARGE: 01/09/2021  PRIMARY CARE PHYSICIAN: Caryl Asp E, NP   ADMISSION DIAGNOSIS:  Acute hypoxemic respiratory failure due to COVID-19 (H[facility]) [U07.1, J96.01] DISCHARGE DIAGNOSIS:  Active Problems:   Anxiety   Palpitations - evaluated by Dr. Cathie Olden in the past   Malnutrition of moderate degree ([place])   Alcohol abuse   Abnormal laboratory test result   Acute hypoxemic respiratory failure due to COVID-19 [facility])  SECONDARY DIAGNOSIS:   Past Medical History:  Diagnosis Date  . Abnormal Pap smear    Age 58  . 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 Alcohol abuse   . Anxiety   . Bacterial infection   . Cervical intraepithelial neoplasia III   . 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 Condyloma 2011  . H/O fatigue 2009  . H/O varicella   . Headache(784.0)   . HPV (human papilloma virus) anogenital infection   . Hx of dizziness 09/20/2011  . Hx: UTI (urinary tract infection)    Back pain  . Irregular menstrual cycle   . IV drug user   . Palpitations 2010  . Pelvic pain in female 12/05/11  . 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 Trichomonas   . Yeast infection    HOSPITAL COURSE:  45 year old female with a known history of alcohol abuse, history of cervical cancer, atopic dermatitis is admitted for feeling weak for about 2 weeks  Acute hypoxic respiratory failure present on admission secondary to COVID-19 infection with superimposed bacterial infection Completed 5-day course of IV remdesivir.  On steroids Currently on room air  Sepsis present on admission due to COVID-19 infection with superimposed bacterial infection  Right moderate pleural effusion Strep intermedius empyema Status post  ultrasound-guided thoracentesis on 1/18 Status post pigtail thoracostomy under CT guidance by IR on 1/21 Right chest tube with mucopurulent debris actively draining. Will need CT surgery evaluation for more definitive treatment of multi loculated effusion. I had discussed case with Nicholes Rough (extender for Dr Orvan Seen) who has agreed to see her once she get to [facility].  Severe hyponatremia -present on admission and now resolved Sodium 113-> 131  Hypokalemia/hypomagnesemia Repleted  Alcohol abuse Counseled for cessation, CIWA protocol  Hypotension On midodrine 5 mg p.o. twice daily  Normocytic anemia Stable  Moderate protein calorie malnutrition Secondary to chronic daily alcohol use, nutritionist consult Body mass index is 28.32 kg/m.     DISCHARGE CONDITIONS:  stable CONSULTS OBTAINED:  Treatment Team:  Ottie Glazier, MD Wonda Olds, MD DRUG ALLERGIES:   Allergies  Allergen Reactions  . Acetaminophen     Intolerance, hx of tylenol OD    DISCHARGE MEDICATIONS:   Allergies as of 01/07/2021      Reactions   Acetaminophen    Intolerance, hx of tylenol OD       Medication List    STOP taking these medications   propranolol 40 MG tablet Commonly known as: INDERAL     TAKE these medications   dexamethasone 10 MG/ML injection Commonly known as: DECADRON Inject 0.6 mLs (6 mg total) into the vein daily. Start taking on: January 08, 2021   enoxaparin 40 MG/0.4ML injection Commonly  known as: LOVENOX Inject 0.4 mLs (40 mg total) into the skin daily.   midodrine 5 MG tablet Commonly known as: PROAMATINE Take 1 tablet (5 mg total) by mouth 2 (two) times daily with a meal.      DISCHARGE INSTRUCTIONS:   DIET:  Regular diet DISCHARGE CONDITION:  Stable ACTIVITY:  Activity as tolerated OXYGEN:  Home Oxygen: No.  Oxygen Delivery: room air DISCHARGE LOCATION:  [facility]  If you experience worsening of your admission symptoms, develop  shortness of breath, life threatening emergency, suicidal or homicidal thoughts you must seek medical attention immediately by calling 911 or calling your MD immediately  if symptoms less severe.  You Must read complete instructions/literature along with all the possible adverse reactions/side effects for all the Medicines you take and that have been prescribed to you. Take any new Medicines after you have completely understood and accpet all the possible adverse reactions/side effects.   Please note  You were cared for by a hospitalist during your hospital stay. If you have any questions about your discharge medications or the care you received while you were in the hospital after you are discharged, you can call the unit and asked to speak with the hospitalist on call if the hospitalist that took care of you is not available. Once you are discharged, your primary care physician will handle any further medical issues. Please note that NO REFILLS for any discharge medications will be authorized once you are discharged, as it is imperative that you return to your primary care physician (or establish a relationship with a primary care physician if you do not have one) for your aftercare needs so that they can reassess your need for medications and monitor your lab values.    On the day of Discharge:  VITAL SIGNS:  Blood pressure 106/71, pulse 97, temperature 97.7 F (36.5 C), temperature source Oral, resp. rate 16, height 5' (1.524 m), weight 65.8 kg, SpO2 97 %. PHYSICAL EXAMINATION:  GENERAL:  45 y.o.-year-old patient lying in the bed with no acute distress.  EYES: Pupils equal, round, reactive to light and accommodation. No scleral icterus. Extraocular muscles intact.  HEENT: Head atraumatic, normocephalic. Oropharynx and nasopharynx clear.  NECK:  Supple, no jugular venous distention. No thyroid enlargement, no tenderness.  LUNGS: Decreased breath sounds right lower lung, no wheezing,  rales,crepitation. No use of accessory muscles of respiration.  Rhonchi at right base.  Chest tube with muco-purulent drainage. CARDIOVASCULAR: S1, S2 normal. No murmurs, rubs, or gallops.  ABDOMEN: Soft, non-tender, non-distended. Bowel sounds present. No organomegaly or mass.  EXTREMITIES: No pedal edema, cyanosis, or clubbing.  NEUROLOGIC: Cranial nerves II through XII are intact. Muscle strength 5/5 in all extremities. Sensation intact. Gait not checked.  PSYCHIATRIC: The patient is alert and oriented x 3.  SKIN: No obvious rash, lesion, or ulcer.  DATA REVIEW:   CBC Recent Labs  Lab 01/07/21 0451  WBC 9.1  HGB 8.0*  HCT 22.0*  PLT 163    Chemistries  Recent Labs  Lab 01/04/21 1546 01/04/21 1553 01/07/21 0410 01/07/21 0451  NA 114*   < > 131*  --   K 2.8*   < > 3.2*  --   CL 69*   < > 89*  --   CO2 24   < > 31  --   GLUCOSE 97   < > 100*  --   BUN 30*   < > 9  --   CREATININE 1.38*   < >  0.56  --   CALCIUM 7.4*   < > 7.6*  --   MG  --    < >  --  1.5*  AST 155*  --   --   --   ALT 31  --   --   --   ALKPHOS 102  --   --   --   BILITOT 4.3*  --   --   --    < > = values in this interval not displayed.     Outpatient follow-up   30 Day Unplanned Readmission Risk Score   Flowsheet Row ED from 01/04/2021 in [place]  30 Day Unplanned Readmission Risk Score (%) 16.09 Filed at 01/07/2021 1200     This score is the patient's risk of an unplanned readmission within 30 days of being discharged (0 -100%). The score is based on dignosis, age, lab data, medications, orders, and past utilization.   Low:  0-14.9   Medium: 15-21.9   High: 22-29.9   Extreme: 30 and above         Management plans discussed with the patient, family/father Richardson Landry updated over phone and they are in agreement.  CODE STATUS: Full Code   TOTAL TIME TAKING CARE OF THIS PATIENT: 45 minutes.    Max Sane M.D on 01/07/2021 at 12:40 PM  [facility]   CC: Primary care physician; Langston Reusing, NP   Note: This dictation was prepared with Dragon dictation along with smaller phrase technology. Any transcriptional errors that result from this process are unintentional.

## 2021-01-08 ENCOUNTER — Inpatient Hospital Stay: Payer: Self-pay

## 2021-01-08 DIAGNOSIS — J869 Pyothorax without fistula: Secondary | ICD-10-CM | POA: Diagnosis not present

## 2021-01-08 DIAGNOSIS — F1010 Alcohol abuse, uncomplicated: Secondary | ICD-10-CM

## 2021-01-08 DIAGNOSIS — U071 COVID-19: Secondary | ICD-10-CM | POA: Diagnosis not present

## 2021-01-08 DIAGNOSIS — F419 Anxiety disorder, unspecified: Secondary | ICD-10-CM

## 2021-01-08 DIAGNOSIS — J9601 Acute respiratory failure with hypoxia: Secondary | ICD-10-CM | POA: Diagnosis not present

## 2021-01-08 LAB — CBC WITH DIFFERENTIAL/PLATELET
Abs Immature Granulocytes: 0.59 10*3/uL — ABNORMAL HIGH (ref 0.00–0.07)
Basophils Absolute: 0 10*3/uL (ref 0.0–0.1)
Basophils Relative: 0 %
Eosinophils Absolute: 0 10*3/uL (ref 0.0–0.5)
Eosinophils Relative: 0 %
HCT: 20.8 % — ABNORMAL LOW (ref 36.0–46.0)
Hemoglobin: 7.7 g/dL — ABNORMAL LOW (ref 12.0–15.0)
Immature Granulocytes: 5 %
Lymphocytes Relative: 11 %
Lymphs Abs: 1.2 10*3/uL (ref 0.7–4.0)
MCH: 34.2 pg — ABNORMAL HIGH (ref 26.0–34.0)
MCHC: 37 g/dL — ABNORMAL HIGH (ref 30.0–36.0)
MCV: 92.4 fL (ref 80.0–100.0)
Monocytes Absolute: 1.3 10*3/uL — ABNORMAL HIGH (ref 0.1–1.0)
Monocytes Relative: 12 %
Neutro Abs: 7.7 10*3/uL (ref 1.7–7.7)
Neutrophils Relative %: 72 %
Platelets: 165 10*3/uL (ref 150–400)
RBC: 2.25 MIL/uL — ABNORMAL LOW (ref 3.87–5.11)
RDW: 13.7 % (ref 11.5–15.5)
Smear Review: NORMAL
WBC: 10.9 10*3/uL — ABNORMAL HIGH (ref 4.0–10.5)
nRBC: 0.6 % — ABNORMAL HIGH (ref 0.0–0.2)

## 2021-01-08 LAB — BASIC METABOLIC PANEL
Anion gap: 13 (ref 5–15)
BUN: 10 mg/dL (ref 6–20)
CO2: 26 mmol/L (ref 22–32)
Calcium: 7.6 mg/dL — ABNORMAL LOW (ref 8.9–10.3)
Chloride: 90 mmol/L — ABNORMAL LOW (ref 98–111)
Creatinine, Ser: 0.57 mg/dL (ref 0.44–1.00)
GFR, Estimated: >60 mL/min (ref >60)
Glucose, Bld: 124 mg/dL — ABNORMAL HIGH (ref 70–99)
Potassium: 3.6 mmol/L (ref 3.5–5.1)
Sodium: 129 mmol/L — ABNORMAL LOW (ref 135–145)

## 2021-01-08 LAB — MAGNESIUM: Magnesium: 1.8 mg/dL (ref 1.7–2.4)

## 2021-01-08 LAB — BODY FLUID CULTURE

## 2021-01-08 LAB — PHOSPHORUS: Phosphorus: 3.5 mg/dL (ref 2.5–4.6)

## 2021-01-08 LAB — CHOLESTEROL, BODY FLUID: Cholesterol, Fluid: 37 mg/dL

## 2021-01-08 LAB — C-REACTIVE PROTEIN: CRP: 8.8 mg/dL — ABNORMAL HIGH (ref <1.0)

## 2021-01-08 LAB — CYTOLOGY - NON PAP

## 2021-01-08 LAB — FIBRIN DERIVATIVES D-DIMER (ARMC ONLY): Fibrin derivatives D-dimer (ARMC): 1558.46 ng/mL (FEU) — ABNORMAL HIGH (ref 0.00–499.00)

## 2021-01-08 MED ORDER — GUAIFENESIN-DM 100-10 MG/5ML PO SYRP
5.0000 mL | ORAL_SOLUTION | ORAL | Status: DC | PRN
  Administered 2021-01-08: 5 mL via ORAL
  Filled 2021-01-08: qty 5

## 2021-01-08 MED ORDER — ENSURE ENLIVE PO LIQD
237.0000 mL | Freq: Three times a day (TID) | ORAL | Status: DC
  Administered 2021-01-08 – 2021-01-09 (×2): 237 mL via ORAL

## 2021-01-08 MED ORDER — MIDAZOLAM HCL 5 MG/5ML IJ SOLN
INTRAMUSCULAR | Status: AC
  Filled 2021-01-08: qty 5

## 2021-01-08 MED ORDER — MIDAZOLAM HCL 2 MG/2ML IJ SOLN
INTRAMUSCULAR | Status: AC | PRN
  Administered 2021-01-08 (×2): 1 mg via INTRAVENOUS

## 2021-01-08 MED ORDER — POTASSIUM CHLORIDE CRYS ER 20 MEQ PO TBCR
40.0000 meq | EXTENDED_RELEASE_TABLET | Freq: Once | ORAL | Status: AC
  Administered 2021-01-08: 40 meq via ORAL
  Filled 2021-01-08: qty 2

## 2021-01-08 MED ORDER — SALINE SPRAY 0.65 % NA SOLN
1.0000 | NASAL | Status: DC | PRN
  Administered 2021-01-08: 1 via NASAL
  Filled 2021-01-08: qty 44

## 2021-01-08 MED ORDER — MAGNESIUM SULFATE 2 GM/50ML IV SOLN
2.0000 g | Freq: Once | INTRAVENOUS | Status: AC
  Administered 2021-01-08: 2 g via INTRAVENOUS
  Filled 2021-01-08: qty 50

## 2021-01-08 NOTE — Progress Notes (Signed)
Initial Nutrition Assessment  DOCUMENTATION CODES:   Not applicable  INTERVENTION:   Ensure Enlive po TID, each supplement provides 350 kcal and 20 grams of protein  MVI, folic acid and thiamine in setting of etoh abuse   Pt at high refeed risk; recommend monitor potassium, magnesium and phosphorus labs daily until stable  NUTRITION DIAGNOSIS:   Increased nutrient needs related to catabolic illness (COVID 19) as evidenced by estimated needs.  GOAL:   Patient will meet greater than or equal to 90% of their needs  MONITOR:   Supplement acceptance,PO intake,Labs,Weight trends,I & O's,Skin  REASON FOR ASSESSMENT:   Consult Assessment of nutrition requirement/status  ASSESSMENT:   45 year old female with a known history of alcohol abuse, cervical cancer and atopic dermatitis who is admitted with COVID 19, sepsis and pleural effusion/empyema now s/p CT guided chest tube 1/21   Pt s/p thoracentesis 1/18 with 217m output  Met with pt in room today. Pt reports good appetite and oral intake at baseline but reports that her appetite has been decreased in hospital. Pt reports that she has been eating some food brought in my her friend as she does not really like the hospital food. Pt did not have lunch today r/t a procedure but reports that she is planning to eat dinner. Pt reports that she enjoys chocolate Ensure and she drinks this at home. RD will add supplements to help pt meet her estimated needs. Pt is likely at refeed risk. Per chart, pt is down 9lbs(6%) from her UBW; RD unsure how recently weight loss occurred. Pt reports that her weight is fairly stable at baseline. [facility]    Medications reviewed and include: dexamethasone, lovenox, folic acid, MVI, nicotine, thiamine, unasyn, Mg sulfate  Labs reviewed: Na 129(L), P 3.5 wnl, Mg 1.8 wnl Wbc- 10.9(L), Hgb 7.7(L), Hct 20.8(L)  NUTRITION - FOCUSED PHYSICAL EXAM:  Flowsheet Row Most Recent  Value  Orbital Region No depletion  Upper Arm Region Mild depletion  Thoracic and Lumbar Region Mild depletion  Buccal Region No depletion  Temple Region No depletion  Clavicle Bone Region No depletion  Clavicle and Acromion Bone Region No depletion  Scapular Bone Region No depletion  Dorsal Hand No depletion  Patellar Region Mild depletion  Anterior Thigh Region Mild depletion  Posterior Calf Region Mild depletion  Edema (RD Assessment) None  Hair Reviewed  Eyes Reviewed  Mouth Reviewed  Skin Reviewed  Nails Reviewed     Diet Order:   Diet Order            Diet regular Room service appropriate? Yes; Fluid consistency: Thin  Diet effective now           Diet - low sodium heart healthy                EDUCATION NEEDS:   Education needs have been addressed  Skin:  Skin Assessment: Reviewed RN Assessment  Last BM:  pta  Height:   Ht Readings from Last 1 Encounters:  01/07/21 5' (1.524 m)    Weight:   Wt Readings from Last 1 Encounters:  01/07/21 58.6 kg    Ideal Body Weight:  45.45 kg  BMI:  Body mass index is 25.23 kg/m.  Estimated Nutritional Needs:   Kcal:  1600-1800kcal/day  Protein:  80-90g/day  Fluid:  1.4-1.6L/day  CKoleen DistanceMS, RD, LDN Please refer to [facility] Centerfor RD and/or RD on-call/weekend/after hours pager

## 2021-01-08 NOTE — Progress Notes (Addendum)
ID BP 118/63 (BP Location: Right Arm)   Pulse 66   Temp 98 F (36.7 C) (Oral)   Resp 20   Ht 5' (1.524 m)   Wt 58.6 kg   SpO2 98%   BMI 25.23 kg/m    C/o Some pain at the rt chest wall at the site of the chest drain Purulent reddish fluid in drain Chest b/l air entry Decreased rt base Hss1s2 abd soft CNS non focal  Labs CBC Latest Ref Rng & Units 01/08/2021 01/07/2021 01/06/2021  WBC 4.0 - 10.5 K/uL 10.9(H) 9.1 13.5(H)  Hemoglobin 12.0 - 15.0 g/dL 7.7(L) 8.0(L) 8.6(L)  Hematocrit 36.0 - 46.0 % 20.8(L) 22.0(L) 23.5(L)  Platelets 150 - 400 K/uL 165 163 178   CMP Latest Ref Rng & Units 01/08/2021 01/07/2021 01/06/2021  Glucose 70 - 99 mg/dL 124(H) 100(H) 204(H)  BUN 6 - 20 mg/dL 10 9 14   Creatinine 0.44 - 1.00 mg/dL 0.57 0.56 0.61  Sodium 135 - 145 mmol/L 129(L) 131(L) 126(L)  Potassium 3.5 - 5.1 mmol/L 3.6 3.2(L) 3.4(L)  Chloride 98 - 111 mmol/L 90(L) 89(L) 88(L)  CO2 22 - 32 mmol/L 26 31 28   Calcium 8.9 - 10.3 mg/dL 7.6(L) 7.6(L) 8.0(L)  Total Protein 6.5 - 8.1 g/dL - - -  Total Bilirubin 0.3 - 1.2 mg/dL - - -  Alkaline Phos 38 - 126 U/L - - -  AST 15 - 41 U/L - - -  ALT 0 - 44 U/L - - -    Impression/recommendation  Rt empyema-/aspiration pneumonia. Pleural fluid culture Streptococcus intermedius. Patient had chest drain placed today. Loculated pleural fluid. Will need decortication. Patient is being seen by surgeon. Continue Unasyn. She will need for at least 3 to 4 weeks. May be able to switch to p.o. Augmentin after the empyema is completely drained.  COVID-19 illness with acute hypoxic respiratory failure much improved  Anemia  AKI resolved  Encephalopathy resolved Hyponatremia persist. Recommend checking cortisol level  Alcohol abuse  [facility] waiting transfer to [facility] will follow her peripherally this weekend call if needed

## 2021-01-08 NOTE — Progress Notes (Signed)
1        [place] at [place] NAME: [name]    MR#:  [id number]  DATE OF BIRTH:  [birth year]  SUBJECTIVE:  CHIEF COMPLAINT:   Chief Complaint  Patient presents with  . Weakness  Agreeable for CT-guided drainage while here waiting for bed at [facility].  Short of breath REVIEW OF SYSTEMS:  Review of Systems  Constitutional: Negative for diaphoresis, fever, malaise/fatigue and weight loss.  HENT: Negative for ear discharge, ear pain, hearing loss, nosebleeds, sore throat and tinnitus.   Eyes: Negative for blurred vision and pain.  Respiratory: Positive for cough and shortness of breath. Negative for hemoptysis and wheezing.   Cardiovascular: Negative for chest pain, palpitations, orthopnea and leg swelling.  Gastrointestinal: Negative for abdominal pain, blood in stool, constipation, diarrhea, heartburn, nausea and vomiting.  Genitourinary: Negative for dysuria, frequency and urgency.  Musculoskeletal: Negative for back pain and myalgias.  Skin: Negative for itching and rash.  Neurological: Negative for dizziness, tingling, tremors, focal weakness, seizures, weakness and headaches.  Psychiatric/Behavioral: Negative for depression. The patient is not nervous/anxious.     DRUG ALLERGIES:   Allergies  Allergen Reactions  . Acetaminophen     Intolerance, hx of tylenol OD    VITALS:  Blood pressure 118/63, pulse 66, temperature 98 F (36.7 C), temperature source Oral, resp. rate 20, height 5' (1.524 m), weight 58.6 kg, SpO2 98 %. PHYSICAL EXAMINATION:  Physical Exam Constitutional:      Appearance: She is underweight.  HENT:     Head: Normocephalic and atraumatic.  Eyes:     Conjunctiva/sclera: Conjunctivae normal.     Pupils: Pupils are equal, round, and reactive to light.  Neck:     Thyroid: No thyromegaly.     Trachea: No tracheal deviation.  Cardiovascular:     Rate and Rhythm: Normal rate and regular rhythm.     Heart sounds: Normal  heart sounds.  Pulmonary:     Effort: Pulmonary effort is normal. No respiratory distress.     Breath sounds: Decreased air movement present. Examination of the right-lower field reveals decreased breath sounds and rhonchi. Examination of the left-lower field reveals rhonchi. Decreased breath sounds and rhonchi present. No wheezing.  Chest:     Chest wall: No tenderness.  Abdominal:     General: Bowel sounds are normal. There is no distension.     Palpations: Abdomen is soft.     Tenderness: There is no abdominal tenderness.  Musculoskeletal:        General: Normal range of motion.     Cervical back: Normal range of motion and neck supple.  Skin:    General: Skin is warm and dry.     Findings: No rash.  Neurological:     Mental Status: She is alert.     Cranial Nerves: No cranial nerve deficit.    LABORATORY PANEL:  Female CBC Recent Labs  Lab 01/08/21 0623  WBC 10.9*  HGB 7.7*  HCT 20.8*  PLT 165   ------------------------------------------------------------------------------------------------------------------ Chemistries  Recent Labs  Lab 01/04/21 1546 01/04/21 1553 01/08/21 0623  NA 114*   < > 129*  K 2.8*   < > 3.6  CL 69*   < > 90*  CO2 24   < > 26  GLUCOSE 97   < > 124*  BUN 30*   < > 10  CREATININE 1.38*   < > 0.57  CALCIUM 7.4*   < > 7.6*  MG  --    < > 1.8  AST 155*  --   --   ALT 31  --   --   ALKPHOS 102  --   --   BILITOT 4.3*  --   --    < > = values in this interval not displayed.   RADIOLOGY:  DG Chest Port 1 View  Result Date: 01/08/2021 CLINICAL DATA:  Empyema.  COVID-19 positive. EXAM: PORTABLE CHEST 1 VIEW COMPARISON:  January 06, 2021. FINDINGS: The heart size and mediastinal contours are within normal limits. Stable small right apical pneumothorax is noted. Left lung is clear. Slightly decreased loculated right pleural effusion is noted with associated right basilar atelectasis or infiltrate. The visualized skeletal structures are  unremarkable. IMPRESSION: Right hydropneumothorax is again noted with associated right basilar atelectasis or infiltrate. No pneumothorax is noted. Electronically Signed   By: Marijo Conception M.D.   On: 01/08/2021 09:52   CT IMAGE GUIDED DRAINAGE BY PERCUTANEOUS CATHETER  Result Date: 01/08/2021 CLINICAL DATA:  Empyema, planned decortication EXAM: CT GUIDED CHEST DRAIN PLACEMENT ANESTHESIA/SEDATION: Intravenous versed 2mg  were administered for anxiolysis during continuous monitoring of the patient's level of consciousness and physiological / cardiorespiratory status by the radiology RN. PROCEDURE: The procedure, risks, benefits, and alternatives were explained to the patient. Questions regarding the procedure were encouraged and answered. The patient understands and consents to the procedure. Select axial scans through the thorax were obtained. The loculated right pleural effusion was localized and an appropriate skin entry site was determined and marked. The operative field was prepped with chlorhexidinein a sterile fashion, and a sterile drape was applied covering the operative field. A sterile gown and sterile gloves were used for the procedure. Local anesthesia was provided with 1% Lidocaine. Under CT fluoroscopic guidance, a 7 cm 5 [place] Yueh sheath needle advanced into the pleural space. Amplatz guidewire advanced easily, its position confirmed on CT. Tract dilated to facilitate placement of a 14 French pigtail drain catheter, placed with into the dominant dependent aspect of the collection. Catheter position confirmed on CT. Catheter was secured externally with 0 Prolene suture and StatLock and placed to Pleur-evac -20 cm H2O drainage. Purulent material returned. The patient tolerated the procedure well. COMPLICATIONS: None immediate FINDINGS: Multiloculated right hydropneumothorax was localized. 14 French pigtail drain catheter placed in the dominant dependent component. IMPRESSION: Technically  successful CT-guided chest tube placemenT. Electronically Signed   By: Lucrezia Europe M.D.   On: 01/08/2021 13:47   ASSESSMENT AND PLAN:  45 year old female with a known history of alcohol abuse, history of cervical cancer, atopic dermatitis is admitted for feeling weak for about 2 weeks  Acute hypoxic respiratory failure present on admission secondary to COVID-19 infection with possible underlying superimposed bacterial infection.  On room air now Completed 5-day course of remdesivir.  Continue steroids Appreciate pulmonary and ID input Continue IV Unasyn for now  Sepsis present on admission due to COVID-19 infection with possible superimposed bacterial infection  Pleural effusion/empyema Status post ultrasound-guided thoracentesis on 1/18 Status post pigtail thoracostomy catheter in place under CT guidance by IR on 1/21 Surgery consult to manage pigtail catheter Still waiting for bed at [facility] for CT surgery evaluation for more definitive treatment of multiloculated effusion  Severe hyponatremia Sodium at 129   Hypokalemia Replete and recheck  Alcohol abuse Counseled for cessation, CIWA protocol  Hypotension On midodrine 5 mg p.o. twice daily  Normocytic anemia Stable  Moderate protein calorie malnutrition Secondary to chronic daily  alcohol use, nutritionist consult Body mass index is 25.23 kg/m.      Status is: Inpatient  Remains inpatient appropriate because:Unsafe d/c plan   Dispo: The patient is from: Home              Anticipated d/c is to: [facility]              Anticipated d/c date is: 2 days              Patient currently is not medically stable to d/c.  Waiting for transfer to [facility] for CT surgery evaluation at this time.  No beds as of yet    DVT prophylaxis:       enoxaparin (LOVENOX) injection 40 mg Start: 01/04/21 2100 Place TED hose Start: 01/04/21 1831     Family Communication: discussed with patient".  Updated patient's father  over phone on 1/20.  He is also agreeable with transfer at [facility].   All the records are reviewed and case discussed with Care Management/Social Worker. Management plans discussed with the patient, nursing, ID, pulmonary and they are in agreement.  CODE STATUS: Full Code  TOTAL TIME TAKING CARE OF THIS PATIENT: 35 minutes.   More than 50% of the time was spent in counseling/coordination of care: YES  POSSIBLE D/C IN 1-2 DAYS, DEPENDING ON CLINICAL CONDITION.  When bed is available at [facility] M.D on 01/08/2021 at 5:03 PM  [facility]   CC: Primary care physician; Langston Reusing, NP  Note: This dictation was prepared with Dragon dictation along with smaller phrase technology. Any transcriptional errors that result from this process are unintentional.

## 2021-01-08 NOTE — Progress Notes (Signed)
Patient clinically stable post CT placement 14 FR per Dr Vernard Gambles, tolerated well, to pleurovac at 20cm suction. Draining purulent drainage. Awake, alert and oriented post procedure. Only received Versed 2 mg along with local anesthetic since had breakfast. Report given to [place] at bedside post procedure.

## 2021-01-08 NOTE — Progress Notes (Signed)
1        [place] at [place] NAME: [name]    MR#:  [id number]  DATE OF BIRTH:  [birth year]  SUBJECTIVE:  CHIEF COMPLAINT:   Chief Complaint  Patient presents with  . Weakness  She was very sleepy.  Later when she woke up she was agreeable over phone for transfer at [facility] for CT surgery evaluation REVIEW OF SYSTEMS:  ROS unable to evaluate as she was sleepy DRUG ALLERGIES:   Allergies  Allergen Reactions  . Acetaminophen     Intolerance, hx of tylenol OD    VITALS:  Blood pressure (!) 101/47, pulse 86, temperature 98 F (36.7 C), temperature source Oral, resp. rate 20, height 5' (1.524 m), weight 58.6 kg, SpO2 96 %. PHYSICAL EXAMINATION:  Physical Exam Constitutional:      Appearance: She is underweight.  HENT:     Head: Normocephalic and atraumatic.  Eyes:     Conjunctiva/sclera: Conjunctivae normal.     Pupils: Pupils are equal, round, and reactive to light.  Neck:     Thyroid: No thyromegaly.     Trachea: No tracheal deviation.  Cardiovascular:     Rate and Rhythm: Normal rate and regular rhythm.     Heart sounds: Normal heart sounds.  Pulmonary:     Effort: Pulmonary effort is normal. No respiratory distress.     Breath sounds: Decreased air movement present. Examination of the right-lower field reveals decreased breath sounds and rhonchi. Examination of the left-lower field reveals rhonchi. Decreased breath sounds and rhonchi present. No wheezing.  Chest:     Chest wall: No tenderness.  Abdominal:     General: Bowel sounds are normal. There is no distension.     Palpations: Abdomen is soft.     Tenderness: There is no abdominal tenderness.  Musculoskeletal:        General: Normal range of motion.     Cervical back: Normal range of motion and neck supple.  Skin:    General: Skin is warm and dry.     Findings: No rash.  Neurological:     Mental Status: She is lethargic.     Cranial Nerves: No cranial nerve deficit.      Comments: Very sleepy    LABORATORY PANEL:  Female CBC Recent Labs  Lab 01/08/21 0623  WBC 10.9*  HGB 7.7*  HCT 20.8*  PLT 165   ------------------------------------------------------------------------------------------------------------------ Chemistries  Recent Labs  Lab 01/04/21 1546 01/04/21 1553 01/08/21 0623  NA 114*   < > 129*  K 2.8*   < > 3.6  CL 69*   < > 90*  CO2 24   < > 26  GLUCOSE 97   < > 124*  BUN 30*   < > 10  CREATININE 1.38*   < > 0.57  CALCIUM 7.4*   < > 7.6*  MG  --    < > 1.8  AST 155*  --   --   ALT 31  --   --   ALKPHOS 102  --   --   BILITOT 4.3*  --   --    < > = values in this interval not displayed.   RADIOLOGY:  DG Chest Port 1 View  Result Date: 01/08/2021 CLINICAL DATA:  Empyema.  COVID-19 positive. EXAM: PORTABLE CHEST 1 VIEW COMPARISON:  January 06, 2021. FINDINGS: The heart size and mediastinal contours are within normal limits. Stable small right apical pneumothorax is noted.  Left lung is clear. Slightly decreased loculated right pleural effusion is noted with associated right basilar atelectasis or infiltrate. The visualized skeletal structures are unremarkable. IMPRESSION: Right hydropneumothorax is again noted with associated right basilar atelectasis or infiltrate. No pneumothorax is noted. Electronically Signed   By: Marijo Conception M.D.   On: 01/08/2021 09:52   ASSESSMENT AND PLAN:  45 year old female with a known history of alcohol abuse, history of cervical cancer, atopic dermatitis is admitted for feeling weak for about 2 weeks  Acute hypoxic respiratory failure present on admission secondary to COVID-19 infection with possible underlying superimposed bacterial infection Continue remdesivir day 4/5 and steroids Appreciate pulmonary and ID input Continue IV Unasyn for now  Sepsis present on admission due to COVID-19 infection with possible superimposed bacterial infection  Pleural effusion/empyema Status post  ultrasound-guided thoracentesis on 1/18 per pulmonary and ID she will need CT surgery evaluation at tertiary care.  I have started transfer process at [facility] and she is excepted but unfortunate there are no beds available yet.  Severe hyponatremia Sodium at 131 now.  Nephrology managing this Off D5W   Hypokalemia Replete and recheck  Alcohol abuse Counseled for cessation, CIWA protocol  Hypotension On midodrine 5 mg p.o. twice daily  Normocytic anemia Stable  Moderate protein calorie malnutrition Secondary to chronic daily alcohol use, nutritionist consult Body mass index is 25.23 kg/m.      Status is: Inpatient  Remains inpatient appropriate because:Unsafe d/c plan   Dispo: The patient is from: Home              Anticipated d/c is to: Home              Anticipated d/c date is: 2 days              Patient currently is not medically stable to d/c.  Waiting for transfer to [facility] for CT surgery evaluation at this time    DVT prophylaxis:       enoxaparin (LOVENOX) injection 40 mg Start: 01/04/21 2100 Place TED hose Start: 01/04/21 1831     Family Communication: discussed with patient".  Updated patient's father over phone.  He is also agreeable with transfer at [facility].   All the records are reviewed and case discussed with Care Management/Social Worker. Management plans discussed with the patient, nursing, ID, pulmonary and they are in agreement.  CODE STATUS: Full Code  TOTAL TIME TAKING CARE OF THIS PATIENT: 35 minutes.   More than 50% of the time was spent in counseling/coordination of care: YES  POSSIBLE D/C IN 1-2 DAYS, DEPENDING ON CLINICAL CONDITION.  When bed is available at [facility] M.D on 01/08/2021 at 10:41 AM  [facility]   CC: Primary care physician; Langston Reusing, NP  Note: This dictation was prepared with Dragon dictation along with smaller phrase technology. Any transcriptional errors that result  from this process are unintentional.

## 2021-01-08 NOTE — Progress Notes (Signed)
Pulmonary Medicine          Date: 01/08/2021,   MRN# [id number] [name] 08-27-1976     AdmissionWeight: 65.8 kg                 CurrentWeight: 58.6 kg   Referring physician: Dr Jimmye Norman  CHIEF COMPLAINT:   Atelectasis in the context of COVID-19 lower respiratory tract infection.   HISTORY OF PRESENT ILLNESS   As per admission h/p [name] is a 45 y.o. female with medical history significant for palpitations on propanolol, daily alcohol abuse, atopic dermatitis, history of cervical cancer, presents emergency department for chief concerns of weakness and difficulty with balance. She reports she has been feeling weak for about 2 weeks. She denies changes to diet. She endorses a new cough for 1 week. She states she is experiencing shortness of breath both at rest and with activity.  She denies fever, chest pain, abdominal pain, dysuria, hematuria.  She endorses myalgia.  She reports she has never felt this way before.  She endorses feeling fatigue and dizziness.  She states that she consumes approximately 8-10 beers per day.  She endorses tremors when she does not drink EtOH.  Blood work is with mild leukocytosis and chronic anemia.  BMP with chronic electrolyte abnormalities including hyponatremia, hypokalemia hypochloremia, hyperglycemia AKI.  Rapid PCR for COVID is +1-day ago on January 04, 2021.  CT chest with right lung compressive atelectasis secondary to moderate pleural effusion with pulmonary consultation placed for additional evaluation and management. Patient is in no distress and asks how long will she need to stay in hospital.  Met with attending physician after evaluation and reviewed short term care plan.     01/07/21- patient is resting in bed.  RN at bedside states patient has been out of bed and got up to urinate however went on the floor instead of commode.  Vital signs are stable during my evaluation with only mild rhonchi at right chest.  She is  on 2L/min [place].     01/08/21- Patient with strep intermedius + empyema. Currently on room air. Interval CXR today reviewed with stable R pleural effusion. Patient ordered for transfer however no bed available at this moment awaiting transfer. Will order pigtail intrapleural cath while waiting. Reviewed care plan with attending physician today.    PAST MEDICAL HISTORY   Past Medical History:  Diagnosis Date  . Abnormal Pap smear    Age 5  . 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 Alcohol abuse   . Anxiety   . Bacterial infection   . Cervical intraepithelial neoplasia III   . 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 Condyloma 2011  . H/O fatigue 2009  . H/O varicella   . Headache(784.0)   . HPV (human papilloma virus) anogenital infection   . Hx of dizziness 09/20/2011  . Hx: UTI (urinary tract infection)    Back pain  . Irregular menstrual cycle   . IV drug user   . Palpitations 2010  . Pelvic pain in female 12/05/11  . 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    Age 65  . Trichomonas   . Yeast infection      SURGICAL HISTORY   Past Surgical History:  Procedure Laterality Date  . A*wisdom teeth ext    . CERVICAL CONIZATION W/BX  11/30/2011   Procedure: CONIZATION CERVIX WITH BIOPSY;  Surgeon: [name], MD;  Location: [facility];  Service: Gynecology;  Laterality: N/A;  . CERVICAL CONIZATION W/BX  03/16/2012   Procedure: CONIZATION CERVIX WITH BIOPSY;  Surgeon: Ena Dawley, MD;  Location: [facility];  Service: Gynecology;  Laterality: N/A;  . CONIZATION CERVIX     x 3  . DILATION AND CURETTAGE OF UTERUS  11/30/2011   Procedure: DILATATION AND CURETTAGE;  Surgeon: [name], MD;  Location: [facility];  Service: Gynecology;  Laterality: N/A;  . svd     x 1     FAMILY HISTORY   Family History  Problem Relation Age of Onset  . Cervical cancer Mother   . Breast cancer Mother   . Cancer Mother 63       Breast &  cervical   . Stroke Maternal Grandmother   . COPD Maternal Grandfather        Emphysema  . Hypertension Paternal Grandfather   . Stroke Paternal Grandfather   . Cancer Father 102       prostate     SOCIAL HISTORY   Social History   Tobacco Use  . Smoking status: Current Every Day Smoker    Years: 30.00    Types: Cigarettes  . Smokeless tobacco: Former Systems developer  . Tobacco comment: less than 8 a day  Vaping Use  . Vaping Use: Never used  Substance Use Topics  . Alcohol use: Yes    Alcohol/week: 8.0 - 10.0 standard drinks    Types: 8 - 10 Cans of beer per week    Comment: every day after work   . Drug use: Not Currently    Types: Heroin    Comment: Abused rx drugs - on methadone 08/2011     MEDICATIONS    Home Medication:  Current Outpatient Rx  . Order #: [id number] Class: Print  . Order #: [id number] Class: No Print  . Order #: [id number] Class: No Print    Current Medication:  Current Facility-Administered Medications:  .  acetaminophen (TYLENOL) tablet 325 mg, 325 mg, Oral, Q6H PRN, Cox, Amy N, DO .  Ampicillin-Sulbactam (UNASYN) 3 g in sodium chloride 0.9 % 100 mL IVPB, 3 g, Intravenous, Q6H, Wyvonnia Dusky, MD, Last Rate: 200 mL/hr at 01/08/21 0359, 3 g at 01/08/21 0359 .  dexamethasone (DECADRON) injection 6 mg, 6 mg, Intravenous, Q24H, Cox, Amy N, DO, 6 mg at 01/07/21 1031 .  enoxaparin (LOVENOX) injection 40 mg, 40 mg, Subcutaneous, Q24H, Cox, Amy N, DO, 40 mg at 63/01/60 1093 .  folic acid (FOLVITE) tablet 1 mg, 1 mg, Oral, Daily, Cox, Amy N, DO, 1 mg at 01/07/21 1028 .  guaiFENesin-dextromethorphan (ROBITUSSIN DM) 100-10 MG/5ML syrup 5 mL, 5 mL, Oral, Q4H PRN, Lang Snow, NP, 5 mL at 01/08/21 0359 .  ibuprofen (ADVIL) tablet 400 mg, 400 mg, Oral, Q6H PRN, Manuella Ghazi, Vipul, MD .  magnesium sulfate IVPB 2 g 50 mL, 2 g, Intravenous, Once, Oswald Hillock, RPH .  midodrine (PROAMATINE) tablet 5 mg, 5 mg, Oral, BID WC, Cox, Amy N, DO, 5 mg at 01/07/21 1630 .   multivitamin with minerals tablet 1 tablet, 1 tablet, Oral, Daily, Cox, Amy N, DO, 1 tablet at 01/07/21 1029 .  nicotine (NICODERM CQ - dosed in mg/24 hours) patch 21 mg, 21 mg, Transdermal, Daily, Manuella Ghazi, Vipul, MD, 21 mg at 01/07/21 1631 .  ondansetron (ZOFRAN) tablet 4 mg, 4 mg, Oral, Q6H PRN **OR** ondansetron (ZOFRAN) injection 4 mg, 4 mg, Intravenous, Q6H PRN, Cox, Amy N, DO .  potassium chloride SA (KLOR-CON) CR tablet 40 mEq, 40 mEq, Oral, Once, Oswald Hillock, RPH .  [COMPLETED] remdesivir 200 mg in sodium chloride 0.9% 250 mL IVPB, 200 mg, Intravenous, Once, Stopped at 01/04/21 2021 **FOLLOWED BY** remdesivir 100 mg in sodium chloride 0.9 % 100 mL IVPB, 100 mg, Intravenous, Daily, Cox, Amy N, DO, Stopped at 01/07/21 1358 .  sodium chloride (OCEAN) 0.65 % nasal spray 1 spray, 1 spray, Each Nare, PRN, Max Sane, MD .  thiamine tablet 100 mg, 100 mg, Oral, Daily, 100 mg at 01/07/21 1037 **OR** thiamine (B-1) injection 100 mg, 100 mg, Intravenous, Daily, Cox, Amy N, DO, 100 mg at 01/04/21 2106    ALLERGIES   Acetaminophen     REVIEW OF SYSTEMS    Review of Systems:  Gen:  Denies  fever, sweats, chills weigh loss  HEENT: Denies blurred vision, double vision, ear pain, eye pain, hearing loss, nose bleeds, sore throat Cardiac:  No dizziness, chest pain or heaviness, chest tightness,edema Resp:   Denies cough or sputum porduction, shortness of breath,wheezing, hemoptysis,  Gi: Denies swallowing difficulty, stomach pain, nausea or vomiting, diarrhea, constipation, bowel incontinence Gu:  Denies bladder incontinence, burning urine Ext:   Denies Joint pain, stiffness or swelling Skin: Denies  skin rash, easy bruising or bleeding or hives Endoc:  Denies polyuria, polydipsia , polyphagia or weight change Psych:   Denies depression, insomnia or hallucinations   Other:  All other systems negative   VS: BP (!) 101/47 (BP Location: Left Arm)   Pulse 86   Temp 98 F (36.7 C) (Oral)    Resp 20   Ht 5' (1.524 m)   Wt 58.6 kg   SpO2 96%   BMI 25.23 kg/m      PHYSICAL EXAM    GENERAL:NAD, no fevers, chills, no weakness no fatigue HEAD: Normocephalic, atraumatic.  EYES: Pupils equal, round, reactive to light. Extraocular muscles intact. No scleral icterus.  MOUTH: Moist mucosal membrane. Dentition intact. No abscess noted.  EAR, NOSE, THROAT: Clear without exudates. No external lesions.  NECK: Supple. No thyromegaly. No nodules. No JVD.  PULMONARY: mild rhonchi bilaterally  CARDIOVASCULAR: S1 and S2. Regular rate and rhythm. No murmurs, rubs, or gallops. No edema. Pedal pulses 2+ bilaterally.  GASTROINTESTINAL: Soft, nontender, nondistended. No masses. Positive bowel sounds. No hepatosplenomegaly.  MUSCULOSKELETAL: No swelling, clubbing, or edema. Range of motion full in all extremities.  NEUROLOGIC: Cranial nerves II through XII are intact. No gross focal neurological deficits. Sensation intact. Reflexes intact.  SKIN: No ulceration, lesions, rashes, or cyanosis. Skin warm and dry. Turgor intact.  PSYCHIATRIC: Mood, affect within normal limits. The patient is awake, alert and oriented x 3. Insight, judgment intact.       IMAGING    CT HEAD WO CONTRAST  Result Date: 01/04/2021 CLINICAL DATA:  Right-sided weakness EXAM: CT HEAD WITHOUT CONTRAST TECHNIQUE: Contiguous axial images were obtained from the base of the skull through the vertex without intravenous contrast. COMPARISON:  None. FINDINGS: Brain: There is mild diffuse atrophy for age. Prominence of the cisterna magna is an anatomic variant. There is no intracranial mass, hemorrhage, extra-axial fluid collection, or midline shift. Brain parenchyma appears unremarkable; no acute infarct is demonstrable. Vascular: No hyperdense vessel. No appreciable vascular calcification. Skull: Bony calvarium appears intact. Sinuses/Orbits: Visualized paranasal sinuses are clear. Visualized orbits appear symmetric bilaterally.  Other: Mastoid air cells are clear. IMPRESSION: Mild atrophy for age. Brain parenchyma appears unremarkable. No mass or hemorrhage. Electronically Signed  By: Lowella Grip III M.D.   On: 01/04/2021 14:05   CT Chest W Contrast  Result Date: 01/04/2021 CLINICAL DATA:  Right-sided pleural effusion, shortness of breath, leukocytosis. EXAM: CT CHEST WITH CONTRAST TECHNIQUE: Multidetector CT imaging of the chest was performed during intravenous contrast administration. CONTRAST:  34m OMNIPAQUE IOHEXOL 300 MG/ML  SOLN COMPARISON:  Chest x-ray 01/04/2021, CT abdomen pelvis 08/15/2018. FINDINGS: Cardiovascular: Normal heart size. No significant pericardial effusion. The thoracic aorta is normal in caliber. No atherosclerotic plaque of the thoracic aorta. No coronary artery calcifications. Mediastinum/Nodes: Prominent infra cardiac lymph nodes measuring up to 0.6 cm (2:110). No enlarged mediastinal, hilar, or axillary lymph nodes. Thyroid gland, trachea, and esophagus demonstrate no significant findings. Lungs/Pleura: Complete collapse of the right middle lobe and partial collapse of the right lower lobe. Within the collapsed right middle lobe, several rounded hypodensities are noted (2:90). No cavitary lesion identified. No associated surrounding enhancing/thick-walled capsule. Nonspecific calcification within the collapsed right lower lobe. Passive atelectasis of the right upper lobe. Couple scattered peribronchovascular ground-glass airspace opacities within the left upper lobe. Suggestion of round atelectasis at the left base (2:123). Small to moderate volume right pleural effusion. No definite pleural thickening or enhancement to suggest empyema. No pleural effusion on the left. No pneumothorax bilaterally. Upper Abdomen: The hepatic parenchyma is diffusely hypodense compared to the splenic parenchyma consistent with fatty infiltration. Otherwise no acute abnormality. Musculoskeletal: No chest wall  abnormality. No acute or significant osseous findings. IMPRESSION: 1. Small to moderate right pleural effusion with associated complete collapse of right middle lobe and partial collapse of right lower lobe. Several rounded hypodensities within the collapsed right middle lobe likely represents infection/inflammation with no definite findings to suggest abscess formation. Recommend short-term repeat CT chest with intravenous contrast for evaluation of resolution and exclude underlying malignancy. 2. Few scattered ground-glass airspace opacities within the of the left lower lobe could represent atelectasis versus infection/inflammation. Electronically Signed   By: MIven FinnM.D.   On: 01/04/2021 17:28   DG Chest Port 1 View  Result Date: 01/06/2021 CLINICAL DATA:  Post thoracentesis EXAM: PORTABLE CHEST 1 VIEW COMPARISON:  01/05/2021 FINDINGS: Similar moderate right pleural effusion with small pneumothorax. Adjacent right lung atelectasis. Stable left lung aeration. Stable cardiomediastinal contours. IMPRESSION: Similar right hydropneumothorax. Electronically Signed   By: PMacy MisM.D.   On: 01/06/2021 08:15   DG Chest Port 1 View  Result Date: 01/05/2021 CLINICAL DATA:  Status post thoracentesis. EXAM: PORTABLE CHEST 1 VIEW COMPARISON:  Chest x-ray 01/04/2021 FINDINGS: Status post right-sided thoracentesis with a evacuation of a moderate amount of pleural fluid. There is still a moderate-sized, likely loculated pleural effusion. There is a small postprocedural pneumothorax noted. The left lung remains relatively clear. IMPRESSION: Status post right-sided thoracentesis with residual loculated pleural fluid and a small postprocedural pneumothorax. Electronically Signed   By: PMarijo SanesM.D.   On: 01/05/2021 16:32   DG Chest Portable 1 View  Result Date: 01/04/2021 CLINICAL DATA:  Weakness EXAM: PORTABLE CHEST 1 VIEW COMPARISON:  11/17/2014 FINDINGS: Minimal airspace disease at the peripheral  left lung base. At least moderate size right pleural effusion which appears loculated. Consolidation at the right middle lobe and right base. Cardiomediastinal silhouette is obscured. No pneumothorax. IMPRESSION: 1. At least moderate size right pleural effusion which appears loculated. Consolidation at the right middle lobe and right base may reflect atelectasis or pneumonia. 2. Minimal airspace disease at the peripheral left lung base. Electronically Signed   By: KMaudie Mercury  Francoise Ceo M.D.   On: 01/04/2021 16:20   US Abdomen Limited RUQ (LIVER/GB)  Result Date: 01/07/2021 CLINICAL DATA:  Transaminitis, steatohepatitis EXAM: ULTRASOUND ABDOMEN LIMITED RIGHT UPPER QUADRANT COMPARISON:  Ultrasound 07/22/2014, CT 08/15/2018 FINDINGS: Gallbladder: Top-normal gallbladder distension. No gallbladder wall thickening. Gallbladder contains some echogenic biliary sludge and few echogenic, shadowing gallstones measuring up to 3.7 mm in diameter. Sonographic Percell Miller sign is reportedly negative. Common bile duct: Diameter: 6 mm, upper limits normal. Liver: Diffusely increased hepatic echogenicity with loss of definition of the portal triads and diminished posterior through transmission compatible with hepatic steatosis. Lobular hepatic surface contours could reflect some developing cirrhotic changes as well. No visible hepatic lesions. No intrahepatic biliary ductal dilatation is evident. Portal vein is patent on color Doppler imaging with normal direction of blood flow towards the liver. Other: Small volume perihepatic ascites and a right pleural effusion noted. IMPRESSION: 1. Diffusely increased hepatic echogenicity with loss of definition of the portal triads and diminished posterior through transmission. Findings are compatible with hepatic steatosis. 2. Lobular hepatic surface contours may reflect some developing cirrhotic changes. Particularly with small volume of perihepatic ascites. 3. Right pleural effusion. 4. Cholelithiasis  and biliary sludge without sonographic evidence of acute cholecystitis. Electronically Signed   By: Lovena Le M.D.   On: 01/07/2021 02:18   US THORACENTESIS ASP PLEURAL SPACE W/IMG GUIDE  Result Date: 01/05/2021 INDICATION: Patient with history of palpitations, alcohol abuse, cervical cancer, presented to ED with weakness and shortness of breath. Request is for therapeutic and diagnostic right-sided thoracentesis EXAM: ULTRASOUND GUIDED THERAPEUTIC AND DIAGNOSTIC RIGHT SIDED THORACENTESIS MEDICATIONS: Lidocaine 1% 10 mL COMPLICATIONS: None immediate. PROCEDURE: An ultrasound guided thoracentesis was thoroughly discussed with the patient and questions answered. The benefits, risks, alternatives and complications were also discussed. The patient understands and wishes to proceed with the procedure. Written consent was obtained. Ultrasound was performed to localize and mark an adequate pocket of fluid in the right chest. The area was then prepped and draped in the normal sterile fashion. 1% Lidocaine was used for local anesthesia. Under ultrasound guidance a 6 Fr Safe-T-Centesis catheter was introduced. Thoracentesis was performed. The catheter was removed and a dressing applied. FINDINGS: A total of approximately 200 mL of thick yellow fluid was removed. The procedure was terminated due to patient's inability to sit still in decompensation of oxygen saturation rate. Samples were sent to the laboratory as requested by the clinical team. IMPRESSION: Successful ultrasound guided right-sided therapeutic and diagnostic thoracentesis yielding 200 mL of pleural fluid. Read by: Rushie Nyhan, NP Electronically Signed   By: Miachel Roux M.D.   On: 01/05/2021 16:23      ASSESSMENT/PLAN   Acute hypoxemic respiratory failure due to COVID-19 lower respiratory tract infection -Remdesevir antiviral - pharmacy protocol 5 d -vitamin C -zinc -decadron 88m IV daily  -Self prone if patient can tolerate   -encourage to use IS and Acapella device for bronchopulmonary hygiene when able -consider Actemra if CRP increments >20 -d/c hepatotoxic medications while on remdesevir -supportive care with ICU telemetry monitoring -PT/OT when possible -procalcitonin, CRP and ferritin trending   Right moderate pleural effusion  - -[place]oreaguided thoracentesis - with fluid studies - r/o empyema vs parapneumonic effusion vs malgnant effusion vs simple transudate              -unclear wheather this is infectious or not due to conflicting results on micorbiology - have asked additional input from ID- I agree this is bacterial empyema and should be evaluated  by thoracic surgery for intrapleural instillation of tpa/dornase as well as decortication.                - recommend transfer to hospital with thoracic surgery service               -have ordered CT guided pigtail catheter to be placed via IR - appreciate collaboration in management                - surgery consultation - appreciate input.     Right lung atelectasis   - should improve as surrounding fluid is drained   -Additionally patient should be encouraged to use incentive spirometer multiple times each hour    -Chest physiotherapy as well as occupational and physical therapy  Possible aspiration pneumonia High risk due to alcoholism history  - Currently on Zithromax and Rocephin >>> Unasyn -appreciate ID    Moderate protein calorie malnutrition  -Patient high risk for refeeding syndrome and complications during hospitalization -RD nutritional evaluation  Severe hyponatremia  -Multiple severe electrolyte derangements-see consultation   -s/p renal evaluation   Thank you for allowing me to participate in the care of this patient.   Patient/Family are satisfied with care plan and all questions have been answered.  This document was prepared using Dragon voice recognition software and may include unintentional dictation errors.     Ottie Glazier, M.D.  Division of [place]

## 2021-01-08 NOTE — Consult Note (Signed)
PHARMACY CONSULT NOTE - FOLLOW UP  Pharmacy Consult for Electrolyte Monitoring and Replacement   Recent Labs: Potassium  Date Value  01/08/2021 3.6 mmol/L  07/21/2014 3.7 mEq/L   Magnesium (mg/dL)  Date Value  01/08/2021 1.8   Calcium (mg/dL)  Date Value  01/08/2021 7.6 (L)  07/21/2014 8.9   Albumin (g/dL)  Date Value  01/06/2021 2.0 (L)  02/05/2020 4.6  07/21/2014 3.2 (L)   Phosphorus (mg/dL)  Date Value  01/08/2021 3.5   Sodium  Date Value  01/08/2021 129 mmol/L (L)  02/05/2020 130 mmol/L (L)  07/21/2014 143 mEq/L  * ~9.2 (Corrected calcium for Albumin 2.0)   Assessment: 45yo Female with PMH of palpitations (on propranolol), daily EtOH abuse, atopic dermatitis, h/o cervical cancer presenting to ED with CC of weakness and difficulty with balance found to be Covid positive.   Pt Covid PNA with Mod Rt Pleural effusion requiring thoracentesis and Rt lung atelectasis w/ c/f possible aspiration PNA. On presentation, pt experiencing multiple electrolyte derangements.  Pharmacy consulted for the management of electrolytes.  Goal of Therapy:  Lytes WNL  Plan:  Will give 20meq KCL po x 1 Will give 2g IV bolus x 1  F/u with AM labs.   Oswald Hillock ,PharmD Clinical Pharmacist 01/08/2021 8:37 AM

## 2021-01-08 NOTE — Consult Note (Signed)
Subjective:   CC: Right pleural effusion  HPI:  [name] is a 45 y.o. female who was consulted by Manuella Ghazi for issue above. Work-up noted loculated pleural effusion on the right lung, likely aspiration pneumonia. she is currently pending transfer to cardiothoracic services over at [facility], but had a interval pigtail catheter placed. Surgery consulted for further monitoring of catheter.  Patient currently complaining of soreness at the catheter insertion site, otherwise no specific complaints.   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  Past Surgical History:  Past Surgical History:  Procedure Laterality Date  . A*wisdom teeth ext    . CERVICAL CONIZATION W/BX  11/30/2011   Procedure: CONIZATION CERVIX WITH BIOPSY;  Surgeon: [name], MD;  Location: [facility];  Service: Gynecology;  Laterality: N/A;  . CERVICAL CONIZATION W/BX  03/16/2012   Procedure: CONIZATION CERVIX WITH BIOPSY;  Surgeon: Ena Dawley, MD;  Location: [facility];  Service: Gynecology;  Laterality: N/A;  . CONIZATION CERVIX     x 3  . DILATION AND CURETTAGE OF UTERUS  11/30/2011   Procedure: DILATATION AND CURETTAGE;  Surgeon: [name], MD;  Location: [facility];  Service: Gynecology;  Laterality: N/A;  . svd     x 1    Family History: family history includes Breast cancer in her mother; COPD in her maternal grandfather; Cancer  (age of onset: 44) in her father; Cancer (age of onset: 84) in her mother; Cervical cancer in her mother; Hypertension in her paternal grandfather; Stroke in her maternal grandmother and paternal grandfather.  Social History:  reports that she has been smoking cigarettes. She has smoked for the past 30.00 years. She has quit using smokeless tobacco. She reports current alcohol use of about 8.0 - 10.0 standard drinks of alcohol per week. She reports previous drug use. Drug: Heroin.  Current Medications:  Prior to Admission medications   Medication Sig Start Date End Date Taking? Authorizing Provider  dexamethasone (DECADRON) 10 MG/ML injection Inject 0.6 mLs (6 mg total) into the vein daily. 01/08/21   Max Sane, MD  enoxaparin (LOVENOX) 40 MG/0.4ML injection Inject 0.4 mLs (40 mg total) into the skin daily. 01/07/21   Max Sane, MD  midodrine (PROAMATINE) 5 MG tablet Take 1 tablet (5 mg total) by mouth 2 (two) times daily with a meal. 01/07/21   Max Sane, MD    Allergies:  Allergies as of 01/04/2021  . (No Known Allergies)    ROS:  General: Denies weight loss, weight gain, fatigue, fevers, chills, and night sweats. Eyes: Denies blurry vision, double vision, eye pain, itchy eyes, and tearing. Ears: Denies hearing loss, earache, and ringing in ears. Nose: Denies sinus pain, congestion, infections, runny nose, and nosebleeds. Mouth/throat: Denies hoarseness, sore throat, bleeding gums, and difficulty swallowing. Heart: Denies chest pain, palpitations, racing heart, irregular heartbeat, leg pain or swelling, and decreased activity tolerance. Respiratory: Denies breathing difficulty, shortness of breath, wheezing, cough, and sputum. GI: Denies change in appetite, heartburn, nausea, vomiting, constipation, diarrhea, and blood in stool. GU: Denies difficulty urinating, pain  with urinating, urgency, frequency, blood in urine. Musculoskeletal: Denies joint stiffness, pain, swelling, muscle  weakness. Skin: Denies rash, itching, mass, tumors, sores, and boils Neurologic: Denies headache, fainting, dizziness, seizures, numbness, and tingling. Psychiatric: Denies depression, anxiety, difficulty sleeping, and memory loss. Endocrine: Denies heat or cold intolerance, and increased thirst or urination. Blood/lymph: Denies easy bruising, easy bruising, and swollen glands     Objective:     BP 118/63 (BP Location: Right Arm)   Pulse 66   Temp 98 F (36.7 C) (Oral)   Resp 20   Ht 5' (1.524 m)   Wt 58.6 kg   SpO2 98%   BMI 25.23 kg/m   Constitutional :  alert, cooperative, appears stated age and no distress  Lymphatics/Throat:  no asymmetry, masses, or scars  Respiratory:  diminished breath sounds right.  Chest tube with purulent drainage.  Air leak noted in one column, on active suction  Cardiovascular:  regular rate and rhythm  Gastrointestinal: soft, non-tender; bowel sounds normal; no masses,  no organomegaly.   Musculoskeletal: Steady movement  Skin: Cool and moist  Psychiatric: Normal affect, non-agitated, not confused       LABS:  CMP Latest Ref Rng & Units 01/08/2021 01/07/2021 01/06/2021  Glucose 70 - 99 mg/dL 124(H) 100(H) 204(H)  BUN 6 - 20 mg/dL 10 9 14   Creatinine 0.44 - 1.00 mg/dL 0.57 0.56 0.61  Sodium 135 - 145 mmol/L 129(L) 131(L) 126(L)  Potassium 3.5 - 5.1 mmol/L 3.6 3.2(L) 3.4(L)  Chloride 98 - 111 mmol/L 90(L) 89(L) 88(L)  CO2 22 - 32 mmol/L 26 31 28   Calcium 8.9 - 10.3 mg/dL 7.6(L) 7.6(L) 8.0(L)  Total Protein 6.5 - 8.1 g/dL - - -  Total Bilirubin 0.3 - 1.2 mg/dL - - -  Alkaline Phos 38 - 126 U/L - - -  AST 15 - 41 U/L - - -  ALT 0 - 44 U/L - - -   CBC Latest Ref Rng & Units 01/08/2021 01/07/2021 01/06/2021  WBC 4.0 - 10.5 K/uL 10.9(H) 9.1 13.5(H)  Hemoglobin 12.0 - 15.0 g/dL 7.7(L) 8.0(L) 8.6(L)  Hematocrit 36.0 - 46.0 % 20.8(L) 22.0(L) 23.5(L)  Platelets 150 - 400 K/uL 165 163 178    RADS: CLINICAL DATA:  Empyema, planned  decortication  EXAM: CT GUIDED CHEST DRAIN PLACEMENT  ANESTHESIA/SEDATION: Intravenous versed 2mg  were administered for anxiolysis during continuous monitoring of the patient's level of consciousness and physiological / cardiorespiratory status by the radiology RN.  PROCEDURE: The procedure, risks, benefits, and alternatives were explained to the patient. Questions regarding the procedure were encouraged and answered. The patient understands and consents to the procedure.  Select axial scans through the thorax were obtained. The loculated right pleural effusion was localized and an appropriate skin entry site was determined and marked.  The operative field was prepped with chlorhexidinein a sterile fashion, and a sterile drape was applied covering the operative field. A sterile gown and sterile gloves were used for the procedure. Local anesthesia was provided with 1% Lidocaine.  Under CT fluoroscopic guidance, a 7 cm 5 [place] Yueh sheath needle advanced into the pleural space. Amplatz guidewire advanced easily, its position confirmed on CT. Tract dilated to facilitate placement of a 14 French pigtail drain catheter, placed with into the dominant dependent aspect of the collection. Catheter position confirmed on CT. Catheter was secured externally with 0 Prolene suture and StatLock and placed to Pleur-evac -20 cm H2O drainage. Purulent material returned. The patient tolerated the procedure well.  COMPLICATIONS:  None immediate  FINDINGS: Multiloculated right hydropneumothorax was localized. 14 French pigtail drain catheter placed in the dominant dependent component.  IMPRESSION: Technically successful CT-guided chest tube placemenT.   Electronically Signed   By: Lucrezia Europe M.D.   On: 01/08/2021 13:47 Assessment:   Right pleural effusion, purulent, likely aspiration pneumonia Plan:    Pigtail thoracostomy catheter in place and functioning well, still  draining purulent material. Pending transfer to cardiothoracic services for more definitive treatment of multiloculated effusion. Continue to monitor for now. Daily chest x-rays and supportive care per primary team.

## 2021-01-09 ENCOUNTER — Inpatient Hospital Stay: Payer: Self-pay

## 2021-01-09 ENCOUNTER — Inpatient Hospital Stay (HOSPITAL_COMMUNITY)
Admission: AD | Admit: 2021-01-09 | Discharge: 2021-01-19 | DRG: 163 | Disposition: A | Discharge status: Home / Self Care | Payer: HRSA Program | Source: Other Acute Inpatient Hospital | Attending: Internal Medicine | Admitting: Internal Medicine

## 2021-01-09 DIAGNOSIS — Z7902 Long term (current) use of antithrombotics/antiplatelets: Secondary | ICD-10-CM

## 2021-01-09 DIAGNOSIS — F1010 Alcohol abuse, uncomplicated: Secondary | ICD-10-CM | POA: Diagnosis present

## 2021-01-09 DIAGNOSIS — J9811 Atelectasis: Secondary | ICD-10-CM | POA: Diagnosis present

## 2021-01-09 DIAGNOSIS — F419 Anxiety disorder, unspecified: Secondary | ICD-10-CM | POA: Diagnosis present

## 2021-01-09 DIAGNOSIS — J942 Hemothorax: Secondary | ICD-10-CM | POA: Diagnosis present

## 2021-01-09 DIAGNOSIS — Z7952 Long term (current) use of systemic steroids: Secondary | ICD-10-CM

## 2021-01-09 DIAGNOSIS — IMO0001 Reserved for inherently not codable concepts without codable children: Secondary | ICD-10-CM | POA: Diagnosis present

## 2021-01-09 DIAGNOSIS — J1282 Pneumonia due to coronavirus disease 2019: Secondary | ICD-10-CM | POA: Diagnosis present

## 2021-01-09 DIAGNOSIS — Z09 Encounter for follow-up examination after completed treatment for conditions other than malignant neoplasm: Secondary | ICD-10-CM

## 2021-01-09 DIAGNOSIS — L209 Atopic dermatitis, unspecified: Secondary | ICD-10-CM | POA: Diagnosis present

## 2021-01-09 DIAGNOSIS — E2740 Unspecified adrenocortical insufficiency: Secondary | ICD-10-CM | POA: Diagnosis present

## 2021-01-09 DIAGNOSIS — U071 COVID-19: Secondary | ICD-10-CM | POA: Diagnosis not present

## 2021-01-09 DIAGNOSIS — E222 Syndrome of inappropriate secretion of antidiuretic hormone: Secondary | ICD-10-CM | POA: Diagnosis present

## 2021-01-09 DIAGNOSIS — Z79899 Other long term (current) drug therapy: Secondary | ICD-10-CM

## 2021-01-09 DIAGNOSIS — Z87440 Personal history of urinary (tract) infections: Secondary | ICD-10-CM

## 2021-01-09 DIAGNOSIS — J9382 Other air leak: Secondary | ICD-10-CM | POA: Diagnosis not present

## 2021-01-09 DIAGNOSIS — B954 Other streptococcus as the cause of diseases classified elsewhere: Secondary | ICD-10-CM | POA: Diagnosis present

## 2021-01-09 DIAGNOSIS — J90 Pleural effusion, not elsewhere classified: Secondary | ICD-10-CM | POA: Diagnosis present

## 2021-01-09 DIAGNOSIS — F17210 Nicotine dependence, cigarettes, uncomplicated: Secondary | ICD-10-CM | POA: Diagnosis present

## 2021-01-09 DIAGNOSIS — N926 Irregular menstruation, unspecified: Secondary | ICD-10-CM | POA: Diagnosis present

## 2021-01-09 DIAGNOSIS — R197 Diarrhea, unspecified: Secondary | ICD-10-CM | POA: Diagnosis not present

## 2021-01-09 DIAGNOSIS — E872 Acidosis: Secondary | ICD-10-CM | POA: Diagnosis present

## 2021-01-09 DIAGNOSIS — Z8541 Personal history of malignant neoplasm of cervix uteri: Secondary | ICD-10-CM

## 2021-01-09 DIAGNOSIS — F17200 Nicotine dependence, unspecified, uncomplicated: Secondary | ICD-10-CM | POA: Diagnosis present

## 2021-01-09 DIAGNOSIS — E876 Hypokalemia: Secondary | ICD-10-CM | POA: Diagnosis present

## 2021-01-09 DIAGNOSIS — Z886 Allergy status to analgesic agent status: Secondary | ICD-10-CM

## 2021-01-09 DIAGNOSIS — J869 Pyothorax without fistula: Principal | ICD-10-CM | POA: Diagnosis present

## 2021-01-09 DIAGNOSIS — E440 Moderate protein-calorie malnutrition: Secondary | ICD-10-CM | POA: Diagnosis present

## 2021-01-09 DIAGNOSIS — D62 Acute posthemorrhagic anemia: Secondary | ICD-10-CM | POA: Diagnosis present

## 2021-01-09 DIAGNOSIS — Z6825 Body mass index (BMI) 25.0-25.9, adult: Secondary | ICD-10-CM

## 2021-01-09 DIAGNOSIS — Z4682 Encounter for fitting and adjustment of non-vascular catheter: Secondary | ICD-10-CM

## 2021-01-09 DIAGNOSIS — D649 Anemia, unspecified: Secondary | ICD-10-CM

## 2021-01-09 LAB — BASIC METABOLIC PANEL
Anion gap: 9 (ref 5–15)
BUN: 10 mg/dL (ref 6–20)
CO2: 25 mmol/L (ref 22–32)
Calcium: 7.2 mg/dL — ABNORMAL LOW (ref 8.9–10.3)
Chloride: 96 mmol/L — ABNORMAL LOW (ref 98–111)
Creatinine, Ser: 0.55 mg/dL (ref 0.44–1.00)
GFR, Estimated: >60 mL/min (ref >60)
Glucose, Bld: 133 mg/dL — ABNORMAL HIGH (ref 70–99)
Potassium: 3.9 mmol/L (ref 3.5–5.1)
Sodium: 130 mmol/L — ABNORMAL LOW (ref 135–145)

## 2021-01-09 LAB — CULTURE, BLOOD (ROUTINE X 2)
Culture: NO GROWTH
Culture: NO GROWTH
Special Requests: ADEQUATE
Special Requests: ADEQUATE

## 2021-01-09 LAB — CBC WITH DIFFERENTIAL/PLATELET
Abs Immature Granulocytes: 0.24 10*3/uL — ABNORMAL HIGH (ref 0.00–0.07)
Basophils Absolute: 0 10*3/uL (ref 0.0–0.1)
Basophils Relative: 0 %
Eosinophils Absolute: 0 10*3/uL (ref 0.0–0.5)
Eosinophils Relative: 0 %
HCT: 21.3 % — ABNORMAL LOW (ref 36.0–46.0)
Hemoglobin: 7.5 g/dL — ABNORMAL LOW (ref 12.0–15.0)
Immature Granulocytes: 3 %
Lymphocytes Relative: 14 %
Lymphs Abs: 1 10*3/uL (ref 0.7–4.0)
MCH: 33 pg (ref 26.0–34.0)
MCHC: 35.2 g/dL (ref 30.0–36.0)
MCV: 93.8 fL (ref 80.0–100.0)
Monocytes Absolute: 1.1 10*3/uL — ABNORMAL HIGH (ref 0.1–1.0)
Monocytes Relative: 15 %
Neutro Abs: 5.1 10*3/uL (ref 1.7–7.7)
Neutrophils Relative %: 68 %
Platelets: 167 10*3/uL (ref 150–400)
RBC: 2.27 MIL/uL — ABNORMAL LOW (ref 3.87–5.11)
RDW: 14.6 % (ref 11.5–15.5)
WBC: 7.5 10*3/uL (ref 4.0–10.5)
nRBC: 0.3 % — ABNORMAL HIGH (ref 0.0–0.2)

## 2021-01-09 LAB — CORTISOL-AM, BLOOD: Cortisol - AM: 1.2 ug/dL — ABNORMAL LOW (ref 6.7–22.6)

## 2021-01-09 LAB — C-REACTIVE PROTEIN: CRP: 5.1 mg/dL — ABNORMAL HIGH (ref <1.0)

## 2021-01-09 LAB — MAGNESIUM: Magnesium: 1.6 mg/dL — ABNORMAL LOW (ref 1.7–2.4)

## 2021-01-09 LAB — FIBRIN DERIVATIVES D-DIMER (ARMC ONLY): Fibrin derivatives D-dimer (ARMC): 1050.6 ng/mL (FEU) — ABNORMAL HIGH (ref 0.00–499.00)

## 2021-01-09 LAB — ABO/RH: ABO/RH(D): A POS

## 2021-01-09 MED ORDER — MIDODRINE HCL 5 MG PO TABS
5.0000 mg | ORAL_TABLET | Freq: Two times a day (BID) | ORAL | Status: DC
  Administered 2021-01-10 – 2021-01-19 (×19): 5 mg via ORAL
  Filled 2021-01-09 (×20): qty 1

## 2021-01-09 MED ORDER — MAGNESIUM SULFATE 2 GM/50ML IV SOLN: 2.0000 g | Freq: Once | INTRAVENOUS | Status: DC

## 2021-01-09 MED ORDER — THIAMINE HCL 100 MG PO TABS
100.0000 mg | ORAL_TABLET | Freq: Every day | ORAL | Status: DC
  Administered 2021-01-09 – 2021-01-19 (×11): 100 mg via ORAL
  Filled 2021-01-09 (×11): qty 1

## 2021-01-09 MED ORDER — THIAMINE HCL 100 MG PO TABS: 100.0000 mg | ORAL_TABLET | Freq: Every day | ORAL | Status: DC

## 2021-01-09 MED ORDER — DEXAMETHASONE SODIUM PHOSPHATE 10 MG/ML IJ SOLN
6.0000 mg | INTRAMUSCULAR | Status: DC
  Administered 2021-01-10 – 2021-01-19 (×10): 6 mg via INTRAVENOUS
  Filled 2021-01-09 (×10): qty 0.6

## 2021-01-09 MED ORDER — ADULT MULTIVITAMIN W/MINERALS CH
1.0000 | ORAL_TABLET | Freq: Every day | ORAL | Status: DC
  Administered 2021-01-09 – 2021-01-19 (×11): 1 via ORAL
  Filled 2021-01-09 (×11): qty 1

## 2021-01-09 MED ORDER — IBUPROFEN 400 MG PO TABS
400.0000 mg | ORAL_TABLET | Freq: Four times a day (QID) | ORAL | Status: DC | PRN
  Administered 2021-01-09 – 2021-01-10 (×3): 400 mg via ORAL
  Filled 2021-01-09 (×4): qty 1

## 2021-01-09 MED ORDER — MIDODRINE HCL 5 MG PO TABS: 5.0000 mg | ORAL_TABLET | Freq: Two times a day (BID) | ORAL | Status: DC

## 2021-01-09 MED ORDER — NICOTINE 14 MG/24HR TD PT24
14.0000 mg | MEDICATED_PATCH | Freq: Every day | TRANSDERMAL | Status: DC
  Administered 2021-01-10 – 2021-01-19 (×10): 14 mg via TRANSDERMAL
  Filled 2021-01-09 (×11): qty 1

## 2021-01-09 MED ORDER — FOLIC ACID 1 MG PO TABS
1.0000 mg | ORAL_TABLET | Freq: Every day | ORAL | Status: DC
  Administered 2021-01-09 – 2021-01-19 (×11): 1 mg via ORAL
  Filled 2021-01-09 (×10): qty 1

## 2021-01-09 MED ORDER — THIAMINE HCL 100 MG/ML IJ SOLN: 100.0000 mg | Freq: Every day | INTRAMUSCULAR | Status: DC

## 2021-01-09 MED ORDER — CALCIUM CARBONATE ANTACID 500 MG PO CHEW
1.0000 | CHEWABLE_TABLET | Freq: Two times a day (BID) | ORAL | Status: DC
  Filled 2021-01-09: qty 1

## 2021-01-09 MED ORDER — SODIUM CHLORIDE 0.9 % IV SOLN
3.0000 g | Freq: Four times a day (QID) | INTRAVENOUS | Status: DC
  Administered 2021-01-10 – 2021-01-19 (×38): 3 g via INTRAVENOUS
  Filled 2021-01-09: qty 3
  Filled 2021-01-09: qty 8
  Filled 2021-01-09 (×3): qty 3
  Filled 2021-01-09 (×2): qty 8
  Filled 2021-01-09 (×2): qty 3
  Filled 2021-01-09: qty 8
  Filled 2021-01-09: qty 3
  Filled 2021-01-09: qty 8
  Filled 2021-01-09 (×3): qty 3
  Filled 2021-01-09: qty 0.07
  Filled 2021-01-09 (×2): qty 8
  Filled 2021-01-09: qty 3
  Filled 2021-01-09 (×3): qty 8
  Filled 2021-01-09: qty 3
  Filled 2021-01-09: qty 8
  Filled 2021-01-09 (×2): qty 3
  Filled 2021-01-09 (×2): qty 8
  Filled 2021-01-09 (×7): qty 3
  Filled 2021-01-09: qty 0.07
  Filled 2021-01-09 (×3): qty 3
  Filled 2021-01-09: qty 8

## 2021-01-09 NOTE — Consult Note (Signed)
[facility] for Electrolyte Monitoring and Replacement   Recent Labs: Potassium  Date Value  01/09/2021 3.9 mmol/L  07/21/2014 3.7 mEq/L   Magnesium (mg/dL)  Date Value  01/09/2021 1.6 (L)   Calcium (mg/dL)  Date Value  01/09/2021 7.2 (L)  07/21/2014 8.9   Albumin (g/dL)  Date Value  01/06/2021 2.0 (L)  02/05/2020 4.6  07/21/2014 3.2 (L)   Phosphorus (mg/dL)  Date Value  01/08/2021 3.5   Sodium  Date Value  01/09/2021 130 mmol/L (L)  02/05/2020 130 mmol/L (L)  07/21/2014 143 mEq/L   Corrected Ca: 8.8 mg/dL  Assessment: 45yo Female with PMH of palpitations (on propranolol), daily EtOH abuse, atopic dermatitis, h/o cervical cancer presenting to ED with CC of weakness and difficulty with balance found to be Covid positive with moderate Rt Pleural effusion requiring thoracentesis and Rt lung atelectasis w/ c/f possible aspiration PNA. On presentation, pt experiencing multiple electrolyte derangements. Hyponatremia, while still present, is improved  Pharmacy consulted for the management of electrolytes.  Goal of Therapy:  Electrolytes WNL  Plan:   2 grams IV magnesium sulfate x 1  Repeat labs in am   Dallie Piles ,PharmD Clinical Pharmacist 01/09/2021 1:20 PM

## 2021-01-09 NOTE — Progress Notes (Signed)
Patient being transferred to [facility]

## 2021-01-09 NOTE — TOC Progression Note (Signed)
Transition of Care [facility]) - Progression Note    Patient Details  Name: [name] MRN: [id number] Date of Birth: 1976/12/14  Transition of Care [facility]) CM/SW Contact  Izola Price, RN Phone Number: 01/09/2021, 8:43 AM  Clinical Narrative:    01/10/20 0845 Patient pending transfer to [facility]. Awaiting bed at this time.  Per Dr. Trena Platt provider notes on 01/08/21:  "Status post ultrasound-guided thoracentesis on 1/18  Status post pigtail thoracostomy catheter in place under CT guidance by IR on 1/21  Surgery consult to manage pigtail catheter  Still waiting for bed at [facility] for CT surgery evaluation for more definitive treatment of multiloculated effusion" Continue to monitor. Simmie Davies RN CM     Expected Discharge Plan: [place] Barriers to Discharge: Continued Medical Work up,ED Medication assistance,ED PCP establishment,ED Active Substance Abuse,Inadequate or no insurance  Expected Discharge Plan and Services Expected Discharge Plan: [place] In-house Referral: Clinical Social Work     Living arrangements for the past 2 months: Apartment Expected Discharge Date: 01/07/21                                     Social Determinants of Health (SDOH) Interventions    Readmission Risk Interventions No flowsheet data found.

## 2021-01-09 NOTE — TOC Transition Note (Signed)
Transition of Care [facility]) - CM/SW Discharge Note   Patient Details  Name: [name] MRN: [id number] Date of Birth: 04-28-1976  Transition of Care [facility] CM/SW Contact:  Izola Price, RN Phone Number: 01/09/2021, 5:09 PM   Clinical Narrative:   [facility] to [facility] hospital in [place] for surgical work up. Had been waiting on a bed. Unit RN filed Med Nec form. Simmie Davies RN CM    Final next level of care: Acute to Acute Transfer Barriers to Discharge: Barriers Resolved (Transferred to [facility] in [place])   Patient Goals and CMS Choice        Discharge Placement                [facility]   Patient and family notified of of transfer: 01/09/21 (patient via Unit RN)  Discharge Plan and Services In-house Referral: Clinical Social Work              DME Arranged: N/A DME Agency: NA         HH Agency: NA        Social Determinants of Health (SDOH) Interventions     Readmission Risk Interventions No flowsheet data found.

## 2021-01-09 NOTE — Progress Notes (Addendum)
Pharmacy Antibiotic Note  BOB EASTWOOD is a 45 y.o. female admitted on 01/09/2021 with Step intermedius empyema.  Pharmacy has been consulted for ampicillin/sulbactam dosing.  Good renal function. Continuing from [place]:   Continue amp/sulbactam 3g Q 6 hr Monitor cultures, clinical status, renal fx, and f/u duration   Temp (24hrs), Avg:98.2 F (36.8 C), Min:97.7 F (36.5 C), Max:98.5 F (36.9 C)  Recent Labs  Lab 01/04/21 1539 01/04/21 1546 01/04/21 1700 01/04/21 1944 01/05/21 0905 01/05/21 1719 01/06/21 0526 01/06/21 1011 01/07/21 0410 01/07/21 0451 01/08/21 0623 01/09/21 0707  WBC  --   --   --   --  16.7*  --  13.5*  --   --  9.1 10.9* 7.5  CREATININE  --    < >  --    < > 1.00 0.81  --  0.61 0.56  --  0.57 0.55  LATICACIDVEN 2.6*  --  3.0*  --   --   --   --   --   --   --   --   --    < > = values in this interval not displayed.    Estimated Creatinine Clearance: 70.3 mL/min (by C-G formula based on SCr of 0.55 mg/dL).    Allergies  Allergen Reactions  . Acetaminophen     Intolerance, hx of tylenol OD     Antimicrobials this admission: Ampsulb 1/19 >>  CTX 1/17>> 1/18  Azithro 1/17 Remdesevir 1/17>> 1/21  Microbiology results: 1/17 BCx: ngtd 1/18 Pleura Cx: Strep intermedius S PCN    Thank you for allowing pharmacy to be a part of this patient's care.   Benetta Spar, PharmD, BCPS, BCCP Clinical Pharmacist  Please check AMION for all [place] phone numbers After 10:00 PM, call [place] [phone]

## 2021-01-09 NOTE — H&P (Signed)
History and Physical    [name] IWP:[id number] DOB: 02-21-1976 DOA: 01/09/2021  PCP: Langston Reusing, NP Patient coming from: [facility]  HPI: [name] is a 45 y.o. female with medical history significant of alcohol abuse, tobacco use, history of cervical cancer, atopic dermatitis admitted to [facility] on 01/04/2021 for feeling weak for about 2 weeks.  She was treated for sepsis and acute hypoxic respiratory failure secondary to COVID-19 infection with superimposed bacterial infection.  She was treated with steroids and a 5-day course of remdesivir.  Work-up also revealed a right moderate pleural effusion.  Pleural fluid culture grew Streptococcus intermedius.  Patient underwent ultrasound-guided thoracentesis on 1/18 and thoracostomy under CT guidance by IR on 1/21.  Right chest tube draining mucopurulent debris.  ID was consulted and recommended continuing Unasyn for at least 3 to 4 weeks.  She was sent to [facility] for evaluation by CT surgery for more definitive treatment of multiloculated effusion.  Hospitalist at [facility] I discussed the case with Nicholes Rough (extender for Dr. Orvan Seen) who has agreed to see the patient once she arrives to [facility].  Patient also had severe hyponatremia during this admission which has now resolved.  Patient is currently resting comfortably and requesting food to eat.  She feels her breathing has improved.  Endorsing slight discomfort at the site of right chest tube.  No other complaints.  Review of Systems:  All systems reviewed and apart from history of presenting illness, are negative.  Past Medical History:  Diagnosis Date  . Abnormal Pap smear    Age 54  . 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 Alcohol abuse   . Anxiety   . Bacterial infection   . Cervical intraepithelial neoplasia III   . 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 Condyloma 2011  . H/O fatigue 2009  .  H/O varicella   . Headache(784.0)   . HPV (human papilloma virus) anogenital infection   . Hx of dizziness 09/20/2011  . Hx: UTI (urinary tract infection)    Back pain  . Irregular menstrual cycle   . IV drug user   . Palpitations 2010  . Pelvic pain in female 12/05/11  . 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 Trichomonas   . Yeast infection     Past Surgical History:  Procedure Laterality Date  . A*wisdom teeth ext    . CERVICAL CONIZATION W/BX  11/30/2011   Procedure: CONIZATION CERVIX WITH BIOPSY;  Surgeon: [name], MD;  Location: [facility];  Service: Gynecology;  Laterality: N/A;  . CERVICAL CONIZATION W/BX  03/16/2012   Procedure: CONIZATION CERVIX WITH BIOPSY;  Surgeon: Ena Dawley, MD;  Location: [facility];  Service: Gynecology;  Laterality: N/A;  . CONIZATION CERVIX     x 3  . DILATION AND CURETTAGE OF UTERUS  11/30/2011   Procedure: DILATATION AND CURETTAGE;  Surgeon: [name], MD;  Location: [facility];  Service: Gynecology;  Laterality: N/A;  . svd     x 1     reports that she has been smoking cigarettes. She has smoked for the past 30.00 years. She has quit using smokeless tobacco. She reports current alcohol use of about 8.0 - 10.0 standard drinks of alcohol per week. She reports previous drug use. Drug: Heroin.  Allergies  Allergen Reactions  . Acetaminophen     Intolerance, hx of tylenol OD  Family History  Problem Relation Age of Onset  . Cervical cancer Mother   . Breast cancer Mother   . Cancer Mother 74       Breast & cervical   . Stroke Maternal Grandmother   . COPD Maternal Grandfather        Emphysema  . Hypertension Paternal Grandfather   . Stroke Paternal Grandfather   . Cancer Father 26       prostate    Prior to Admission medications   Medication Sig Start Date End Date Taking? Authorizing Provider  dexamethasone (DECADRON) 10 MG/ML injection Inject 0.6 mLs (6 mg total) into the vein daily. 01/08/21   Max Sane, MD  enoxaparin  (LOVENOX) 40 MG/0.4ML injection Inject 0.4 mLs (40 mg total) into the skin daily. 01/07/21   Max Sane, MD  midodrine (PROAMATINE) 5 MG tablet Take 1 tablet (5 mg total) by mouth 2 (two) times daily with a meal. 01/07/21   Max Sane, MD    Physical Exam: Vitals:   01/09/21 1802 01/09/21 2000  BP: 111/90 134/64  Pulse: 66 (!) 57  Resp: 20 20  Temp: 98.4 F (36.9 C) 98.4 F (36.9 C)  TempSrc: Oral Oral  SpO2: 100% 100%    Physical Exam Constitutional:      General: She is not in acute distress. HENT:     Head: Normocephalic and atraumatic.  Eyes:     Extraocular Movements: Extraocular movements intact.     Conjunctiva/sclera: Conjunctivae normal.  Cardiovascular:     Rate and Rhythm: Normal rate and regular rhythm.     Pulses: Normal pulses.  Pulmonary:     Effort: Pulmonary effort is normal.     Breath sounds: No wheezing or rales.     Comments: Slightly diminished breath sounds at the right lung base Chest tube in place with purulent drainage Abdominal:     General: Bowel sounds are normal. There is no distension.     Palpations: Abdomen is soft.     Tenderness: There is no abdominal tenderness.  Musculoskeletal:        General: No swelling or tenderness.     Cervical back: Normal range of motion and neck supple.  Skin:    General: Skin is warm and dry.  Neurological:     General: No focal deficit present.     Mental Status: She is alert and oriented to person, place, and time.     Labs on Admission: I have personally reviewed following labs and imaging studies  CBC: Recent Labs  Lab 01/05/21 0905 01/06/21 0526 01/07/21 0451 01/08/21 0623 01/09/21 0707  WBC 16.7* 13.5* 9.1 10.9* 7.5  NEUTROABS 13.9* 10.6* 6.1 7.7 5.1  HGB 9.1* 8.6* 8.0* 7.7* 7.5*  HCT 25.0* 23.5* 22.0* 20.8* 21.3*  MCV 91.9 92.5 94.8 92.4 93.8  PLT 206 178 163 165 989   Basic Metabolic Panel: Recent Labs  Lab 01/04/21 1553 01/04/21 1944 01/05/21 1719 01/06/21 0526  01/06/21 1011 01/07/21 0410 01/07/21 0451 01/08/21 0623 01/09/21 0707  NA  --    < > 127*  --  126* 131*  --  129* 130*  K  --    < > 3.9  --  3.4* 3.2*  --  3.6 3.9  CL  --    < > 87*  --  88* 89*  --  90* 96*  CO2  --    < > 24  --  28 31  --  26 25  GLUCOSE  --    < >  188*  --  204* 100*  --  124* 133*  BUN  --    < > 20  --  14 9  --  10 10  CREATININE  --    < > 0.81  --  0.61 0.56  --  0.57 0.55  CALCIUM  --    < > 9.0  --  8.0* 7.6*  --  7.6* 7.2*  MG 1.4*  --   --  1.7  --   --  1.5* 1.8 1.6*  PHOS 3.2  --   --  1.8* 1.5*  --  2.9 3.5  --    < > = values in this interval not displayed.   GFR: Estimated Creatinine Clearance: 70.3 mL/min (by C-G formula based on SCr of 0.55 mg/dL). Liver Function Tests: Recent Labs  Lab 01/04/21 1414 01/04/21 1546 01/06/21 1011  AST 145* 155*  --   ALT 27 31  --   ALKPHOS 96 102  --   BILITOT 4.0* 4.3*  --   PROT 6.4* 6.8  --   ALBUMIN 2.3* 2.3* 2.0*   No results for input(s): LIPASE, AMYLASE in the last 168 hours. No results for input(s): AMMONIA in the last 168 hours. Coagulation Profile: Recent Labs  Lab 01/04/21 1441  INR 1.3*   Cardiac Enzymes: No results for input(s): CKTOTAL, CKMB, CKMBINDEX, TROPONINI in the last 168 hours. BNP (last 3 results) No results for input(s): PROBNP in the last 8760 hours. HbA1C: No results for input(s): HGBA1C in the last 72 hours. CBG: Recent Labs  Lab 01/05/21 1825  GLUCAP 186*   Lipid Profile: No results for input(s): CHOL, HDL, LDLCALC, TRIG, CHOLHDL, LDLDIRECT in the last 72 hours. Thyroid Function Tests: No results for input(s): TSH, T4TOTAL, FREET4, T3FREE, THYROIDAB in the last 72 hours. Anemia Panel: No results for input(s): VITAMINB12, FOLATE, FERRITIN, TIBC, IRON, RETICCTPCT in the last 72 hours. Urine analysis:    Component Value Date/Time   COLORURINE YELLOW (A) 01/05/2021 1527   APPEARANCEUR HAZY (A) 01/05/2021 1527   APPEARANCEUR Clear 02/05/2020 1229   LABSPEC  1.015 01/05/2021 1527   PHURINE 5.0 01/05/2021 1527   GLUCOSEU NEGATIVE 01/05/2021 1527   HGBUR MODERATE (A) 01/05/2021 1527   BILIRUBINUR NEGATIVE 01/05/2021 1527   BILIRUBINUR Negative 02/05/2020 1229   KETONESUR 20 (A) 01/05/2021 1527   PROTEINUR NEGATIVE 01/05/2021 1527   UROBILINOGEN 0.2 05/05/2015 2350   NITRITE NEGATIVE 01/05/2021 1527   LEUKOCYTESUR TRACE (A) 01/05/2021 1527    Radiological Exams on Admission: DG Chest 1 View  Result Date: 01/09/2021 CLINICAL DATA:  Pleural effusion EXAM: CHEST  1 VIEW COMPARISON:  01/08/2021 FINDINGS: Right basilar lateral pigtail chest tube has been inserted. Small right pleural effusion persists. Right basilar consolidation is again noted. Small right apical pneumothorax is again seen, grossly stable since prior examination. Left lung is clear. No pneumothorax or pleural effusion on the left. Cardiac size is within normal limits. Pulmonary vascularity is normal. IMPRESSION: Interval right chest tube placement. Persistent right basilar consolidation and small right pleural effusion. Stable right apical pneumothorax. Electronically Signed   By: Fidela Salisbury MD   On: 01/09/2021 06:05   DG Chest Port 1 View  Result Date: 01/08/2021 CLINICAL DATA:  Empyema.  COVID-19 positive. EXAM: PORTABLE CHEST 1 VIEW COMPARISON:  January 06, 2021. FINDINGS: The heart size and mediastinal contours are within normal limits. Stable small right apical pneumothorax is noted. Left lung is clear. Slightly decreased loculated right pleural effusion is  noted with associated right basilar atelectasis or infiltrate. The visualized skeletal structures are unremarkable. IMPRESSION: Right hydropneumothorax is again noted with associated right basilar atelectasis or infiltrate. No pneumothorax is noted. Electronically Signed   By: Marijo Conception M.D.   On: 01/08/2021 09:52   CT IMAGE GUIDED DRAINAGE BY PERCUTANEOUS CATHETER  Result Date: 01/08/2021 CLINICAL DATA:  Empyema,  planned decortication EXAM: CT GUIDED CHEST DRAIN PLACEMENT ANESTHESIA/SEDATION: Intravenous versed 2mg  were administered for anxiolysis during continuous monitoring of the patient's level of consciousness and physiological / cardiorespiratory status by the radiology RN. PROCEDURE: The procedure, risks, benefits, and alternatives were explained to the patient. Questions regarding the procedure were encouraged and answered. The patient understands and consents to the procedure. Select axial scans through the thorax were obtained. The loculated right pleural effusion was localized and an appropriate skin entry site was determined and marked. The operative field was prepped with chlorhexidinein a sterile fashion, and a sterile drape was applied covering the operative field. A sterile gown and sterile gloves were used for the procedure. Local anesthesia was provided with 1% Lidocaine. Under CT fluoroscopic guidance, a 7 cm 5 [place] Yueh sheath needle advanced into the pleural space. Amplatz guidewire advanced easily, its position confirmed on CT. Tract dilated to facilitate placement of a 14 French pigtail drain catheter, placed with into the dominant dependent aspect of the collection. Catheter position confirmed on CT. Catheter was secured externally with 0 Prolene suture and StatLock and placed to Pleur-evac -20 cm H2O drainage. Purulent material returned. The patient tolerated the procedure well. COMPLICATIONS: None immediate FINDINGS: Multiloculated right hydropneumothorax was localized. 14 French pigtail drain catheter placed in the dominant dependent component. IMPRESSION: Technically successful CT-guided chest tube placemenT. Electronically Signed   By: Lucrezia Europe M.D.   On: 01/08/2021 13:47    Assessment/Plan Principal Problem:   Empyema ([place]) Active Problems:   Alcohol abuse   Smoking   Anemia   COVID-19 virus infection   Right pleural effusion/empyema -Pleural fluid culture grew Streptococcus  intermedius.  -Status post ultrasound-guided thoracentesis on 1/18 -Status post pigtail thoracostomy catheter placement under CT guidance by IR on 1/21 -Sent to [facility] for CT surgery evaluation for more definitive treatment of multiloculated effusion.  Patient is currently hemodynamically stable.  We will keep n.p.o. after midnight.  Please consult CT surgery in the morning. -ID at [facility] recommended continuing Unasyn for at least 3 to 4 weeks -Daily chest x-rays  COVID-19 viral pneumonia -Patient completed 5-day course of remdesivir -Continue steroids -Continue airborne and contact precautions. -Currently on room air.  Right lung atelectasis -Expected to improve as surrounding fluid is drained -Encourage use of incentive spirometer multiple times each hour -Chest physiotherapy, PT/OT  Severe hyponatremia -Sodium was 114 on 1/17, improved to 130 today -Continue to monitor  Alcohol abuse -Continue CIWA monitoring -Thiamine, folate, multivitamin -Monitor mag and Phos levels  History of hypotension -Currently normotensive -Continue midodrine 5 mg twice daily  Normocytic anemia -Hemoglobin was 9.7 on 1/17 and 7.5 on labs today. -Continue to monitor -Type and screen, will need transfusion if hemoglobin is less than 7  Moderate protein calorie malnutrition -Secondary to chronic daily alcohol use -BMI 25.23 -Nutritionist consulted  Tobacco use -NicoDerm patch -Counseling  DVT prophylaxis: SCDs at this time, pending CT surgery evaluation Code Status: Full code Family Communication: No family available at this time. Disposition Plan: Status is: Inpatient  Remains inpatient appropriate because:IV treatments appropriate due to intensity of illness or inability to take  PO and Inpatient level of care appropriate due to severity of illness   Dispo: The patient is from: [facility]              Anticipated d/c is to: Home              Anticipated d/c date is: 3 days               Patient currently is not medically stable to d/c.   Difficult to place patient No  The medical decision making on this patient was of high complexity and the patient is at high risk for clinical deterioration, therefore this is a level 3 visit.  Shela Leff MD [facility]  If 7PM-7AM, please contact night-coverage [email or web address]  01/09/2021, 8:22 PM

## 2021-01-09 NOTE — Progress Notes (Signed)
Pt admitted to [facility] from [place] .  Pt is A&O X4 and neuro intact. Vitals taken and all within normal range.  Pt placed on telemetry and CCMD notified. Bed placement/Admit notified of new Pt.  Currently awaiting MD assignment, Pt currently has no orders.  Pt has single L chest tube that is currently clamped.  Pt is currently comfortable with moderate pain resulting from chest tube.

## 2021-01-09 NOTE — Progress Notes (Signed)
Pulmonary Medicine          Date: 01/09/2021,   MRN# [id number] [name] [birth year]     AdmissionWeight: 65.8 kg                 CurrentWeight: 58.6 kg   Referring physician: Dr Jimmye Norman  CHIEF COMPLAINT:   Atelectasis in the context of COVID-19 lower respiratory tract infection.   HISTORY OF PRESENT ILLNESS   As per admission h/p [name] is a 45 y.o. female with medical history significant for palpitations on propanolol, daily alcohol abuse, atopic dermatitis, history of cervical cancer, presents emergency department for chief concerns of weakness and difficulty with balance. She reports she has been feeling weak for about 2 weeks. She denies changes to diet. She endorses a new cough for 1 week. She states she is experiencing shortness of breath both at rest and with activity.  She denies fever, chest pain, abdominal pain, dysuria, hematuria.  She endorses myalgia.  She reports she has never felt this way before.  She endorses feeling fatigue and dizziness.  She states that she consumes approximately 8-10 beers per day.  She endorses tremors when she does not drink EtOH.  Blood work is with mild leukocytosis and chronic anemia.  BMP with chronic electrolyte abnormalities including hyponatremia, hypokalemia hypochloremia, hyperglycemia AKI.  Rapid PCR for COVID is +1-day ago on January 04, 2021.  CT chest with right lung compressive atelectasis secondary to moderate pleural effusion with pulmonary consultation placed for additional evaluation and management. Patient is in no distress and asks how long will she need to stay in hospital.  Met with attending physician after evaluation and reviewed short term care plan.     01/07/21- patient is resting in bed.  RN at bedside states patient has been out of bed and got up to urinate however went on the floor instead of commode.  Vital signs are stable during my evaluation with only mild rhonchi at right chest.  She is  on 2L/min [place].     01/08/21- Patient with strep intermedius + empyema. Currently on room air. Interval CXR today reviewed with stable R pleural effusion. Patient ordered for transfer however no bed available at this moment awaiting transfer. Will order pigtail intrapleural cath while waiting. Reviewed care plan with attending physician today.    01/09/21- patient evaluated at bedside.  Right chest tube with mucopurulent debris actively draining. There is expected amt of tenderness around pigtail insertion site. Lung auscultation inmproved post drainage with patient transitioned to room air. Vitals are stable.   PAST MEDICAL HISTORY   Past Medical History:  Diagnosis Date  . Abnormal Pap smear    Age 51  . 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 Alcohol abuse   . Anxiety   . Bacterial infection   . Cervical intraepithelial neoplasia III   . 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 Condyloma 2011  . H/O fatigue 2009  . H/O varicella   . Headache(784.0)   . HPV (human papilloma virus) anogenital infection   . Hx of dizziness 09/20/2011  . Hx: UTI (urinary tract infection)    Back pain  . Irregular menstrual cycle   . IV drug user   . Palpitations 2010  . Pelvic pain in female 12/05/11  . 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    Age 41  . Trichomonas   . Yeast infection  SURGICAL HISTORY   Past Surgical History:  Procedure Laterality Date  . A*wisdom teeth ext    . CERVICAL CONIZATION W/BX  11/30/2011   Procedure: CONIZATION CERVIX WITH BIOPSY;  Surgeon: [name], MD;  Location: [facility];  Service: Gynecology;  Laterality: N/A;  . CERVICAL CONIZATION W/BX  03/16/2012   Procedure: CONIZATION CERVIX WITH BIOPSY;  Surgeon: Ena Dawley, MD;  Location: [facility];  Service: Gynecology;  Laterality: N/A;  . CONIZATION CERVIX     x 3  . DILATION AND CURETTAGE OF UTERUS  11/30/2011   Procedure: DILATATION AND CURETTAGE;   Surgeon: [name], MD;  Location: [facility];  Service: Gynecology;  Laterality: N/A;  . svd     x 1     FAMILY HISTORY   Family History  Problem Relation Age of Onset  . Cervical cancer Mother   . Breast cancer Mother   . Cancer Mother 54       Breast & cervical   . Stroke Maternal Grandmother   . COPD Maternal Grandfather        Emphysema  . Hypertension Paternal Grandfather   . Stroke Paternal Grandfather   . Cancer Father 53       prostate     SOCIAL HISTORY   Social History   Tobacco Use  . Smoking status: Current Every Day Smoker    Years: 30.00    Types: Cigarettes  . Smokeless tobacco: Former Systems developer  . Tobacco comment: less than 8 a day  Vaping Use  . Vaping Use: Never used  Substance Use Topics  . Alcohol use: Yes    Alcohol/week: 8.0 - 10.0 standard drinks    Types: 8 - 10 Cans of beer per week    Comment: every day after work   . Drug use: Not Currently    Types: Heroin    Comment: Abused rx drugs - on methadone 08/2011     MEDICATIONS    Home Medication:  Current Outpatient Rx  . Order #: [id number] Class: Print  . Order #: [id number] Class: No Print  . Order #: [id number] Class: No Print    Current Medication:  Current Facility-Administered Medications:  .  acetaminophen (TYLENOL) tablet 325 mg, 325 mg, Oral, Q6H PRN, Cox, Amy N, DO .  Ampicillin-Sulbactam (UNASYN) 3 g in sodium chloride 0.9 % 100 mL IVPB, 3 g, Intravenous, Q6H, Wyvonnia Dusky, MD, Last Rate: 200 mL/hr at 01/09/21 0910, 3 g at 01/09/21 0910 .  calcium carbonate (TUMS - dosed in mg elemental calcium) chewable tablet 200 mg of elemental calcium, 1 tablet, Oral, BID WC, Shah, Vipul, MD .  dexamethasone (DECADRON) injection 6 mg, 6 mg, Intravenous, Q24H, Cox, Amy N, DO, 6 mg at 01/09/21 0904 .  enoxaparin (LOVENOX) injection 40 mg, 40 mg, Subcutaneous, Q24H, Cox, Amy N, DO, 40 mg at 01/08/21 2048 .  feeding supplement (ENSURE ENLIVE / ENSURE PLUS) liquid 237 mL, 237 mL, Oral,  TID BM, Max Sane, MD, 237 mL at 01/09/21 0905 .  folic acid (FOLVITE) tablet 1 mg, 1 mg, Oral, Daily, Cox, Amy N, DO, 1 mg at 01/09/21 0904 .  guaiFENesin-dextromethorphan (ROBITUSSIN DM) 100-10 MG/5ML syrup 5 mL, 5 mL, Oral, Q4H PRN, Lang Snow, NP, 5 mL at 01/08/21 0359 .  ibuprofen (ADVIL) tablet 400 mg, 400 mg, Oral, Q6H PRN, Max Sane, MD, 400 mg at 01/09/21 0553 .  midodrine (PROAMATINE) tablet 5 mg, 5 mg, Oral, BID WC, Cox, Amy N, DO, 5 mg  at 01/09/21 0904 .  multivitamin with minerals tablet 1 tablet, 1 tablet, Oral, Daily, Cox, Amy N, DO, 1 tablet at 01/09/21 0903 .  nicotine (NICODERM CQ - dosed in mg/24 hours) patch 21 mg, 21 mg, Transdermal, Daily, Max Sane, MD, 21 mg at 01/09/21 0903 .  ondansetron (ZOFRAN) tablet 4 mg, 4 mg, Oral, Q6H PRN **OR** ondansetron (ZOFRAN) injection 4 mg, 4 mg, Intravenous, Q6H PRN, Cox, Amy N, DO .  sodium chloride (OCEAN) 0.65 % nasal spray 1 spray, 1 spray, Each Nare, PRN, Max Sane, MD, 1 spray at 01/08/21 1129 .  thiamine tablet 100 mg, 100 mg, Oral, Daily, 100 mg at 01/09/21 0300 **OR** thiamine (B-1) injection 100 mg, 100 mg, Intravenous, Daily, Cox, Amy N, DO, 100 mg at 01/04/21 2106    ALLERGIES   Acetaminophen     REVIEW OF SYSTEMS    Review of Systems:  Gen:  Denies  fever, sweats, chills weigh loss  HEENT: Denies blurred vision, double vision, ear pain, eye pain, hearing loss, nose bleeds, sore throat Cardiac:  No dizziness, chest pain or heaviness, chest tightness,edema Resp:   Denies cough or sputum porduction, shortness of breath,wheezing, hemoptysis,  Gi: Denies swallowing difficulty, stomach pain, nausea or vomiting, diarrhea, constipation, bowel incontinence Gu:  Denies bladder incontinence, burning urine Ext:   Denies Joint pain, stiffness or swelling Skin: Denies  skin rash, easy bruising or bleeding or hives Endoc:  Denies polyuria, polydipsia , polyphagia or weight change Psych:   Denies  depression, insomnia or hallucinations   Other:  All other systems negative   VS: BP (!) 101/58 (BP Location: Right Arm)   Pulse 74   Temp 97.7 F (36.5 C) (Oral)   Resp 18   Ht 5' (1.524 m)   Wt 58.6 kg   SpO2 97%   BMI 25.23 kg/m      PHYSICAL EXAM    GENERAL:NAD, no fevers, chills, no weakness no fatigue HEAD: Normocephalic, atraumatic.  EYES: Pupils equal, round, reactive to light. Extraocular muscles intact. No scleral icterus.  MOUTH: Moist mucosal membrane. Dentition intact. No abscess noted.  EAR, NOSE, THROAT: Clear without exudates. No external lesions.  NECK: Supple. No thyromegaly. No nodules. No JVD.  PULMONARY: mild rhonchi bilaterally imporved on right side CARDIOVASCULAR: S1 and S2. Regular rate and rhythm. No murmurs, rubs, or gallops. No edema. Pedal pulses 2+ bilaterally.  GASTROINTESTINAL: Soft, nontender, nondistended. No masses. Positive bowel sounds. No hepatosplenomegaly.  MUSCULOSKELETAL: No swelling, clubbing, or edema. Range of motion full in all extremities.  NEUROLOGIC: Cranial nerves II through XII are intact. No gross focal neurological deficits. Sensation intact. Reflexes intact.  SKIN: No ulceration, lesions, rashes, or cyanosis. Skin warm and dry. Turgor intact.  PSYCHIATRIC: Mood, affect within normal limits. The patient is awake, alert and oriented x 3. Insight, judgment intact.       IMAGING    DG Chest 1 View  Result Date: 01/09/2021 CLINICAL DATA:  Pleural effusion EXAM: CHEST  1 VIEW COMPARISON:  01/08/2021 FINDINGS: Right basilar lateral pigtail chest tube has been inserted. Small right pleural effusion persists. Right basilar consolidation is again noted. Small right apical pneumothorax is again seen, grossly stable since prior examination. Left lung is clear. No pneumothorax or pleural effusion on the left. Cardiac size is within normal limits. Pulmonary vascularity is normal. IMPRESSION: Interval right chest tube placement.  Persistent right basilar consolidation and small right pleural effusion. Stable right apical pneumothorax. Electronically Signed   By: Fidela Salisbury  MD   On: 01/09/2021 06:05   CT HEAD WO CONTRAST  Result Date: 01/04/2021 CLINICAL DATA:  Right-sided weakness EXAM: CT HEAD WITHOUT CONTRAST TECHNIQUE: Contiguous axial images were obtained from the base of the skull through the vertex without intravenous contrast. COMPARISON:  None. FINDINGS: Brain: There is mild diffuse atrophy for age. Prominence of the cisterna magna is an anatomic variant. There is no intracranial mass, hemorrhage, extra-axial fluid collection, or midline shift. Brain parenchyma appears unremarkable; no acute infarct is demonstrable. Vascular: No hyperdense vessel. No appreciable vascular calcification. Skull: Bony calvarium appears intact. Sinuses/Orbits: Visualized paranasal sinuses are clear. Visualized orbits appear symmetric bilaterally. Other: Mastoid air cells are clear. IMPRESSION: Mild atrophy for age. Brain parenchyma appears unremarkable. No mass or hemorrhage. Electronically Signed   By: Lowella Grip III M.D.   On: 01/04/2021 14:05   CT Chest W Contrast  Result Date: 01/04/2021 CLINICAL DATA:  Right-sided pleural effusion, shortness of breath, leukocytosis. EXAM: CT CHEST WITH CONTRAST TECHNIQUE: Multidetector CT imaging of the chest was performed during intravenous contrast administration. CONTRAST:  42m OMNIPAQUE IOHEXOL 300 MG/ML  SOLN COMPARISON:  Chest x-ray 01/04/2021, CT abdomen pelvis 08/15/2018. FINDINGS: Cardiovascular: Normal heart size. No significant pericardial effusion. The thoracic aorta is normal in caliber. No atherosclerotic plaque of the thoracic aorta. No coronary artery calcifications. Mediastinum/Nodes: Prominent infra cardiac lymph nodes measuring up to 0.6 cm (2:110). No enlarged mediastinal, hilar, or axillary lymph nodes. Thyroid gland, trachea, and esophagus demonstrate no significant findings.  Lungs/Pleura: Complete collapse of the right middle lobe and partial collapse of the right lower lobe. Within the collapsed right middle lobe, several rounded hypodensities are noted (2:90). No cavitary lesion identified. No associated surrounding enhancing/thick-walled capsule. Nonspecific calcification within the collapsed right lower lobe. Passive atelectasis of the right upper lobe. Couple scattered peribronchovascular ground-glass airspace opacities within the left upper lobe. Suggestion of round atelectasis at the left base (2:123). Small to moderate volume right pleural effusion. No definite pleural thickening or enhancement to suggest empyema. No pleural effusion on the left. No pneumothorax bilaterally. Upper Abdomen: The hepatic parenchyma is diffusely hypodense compared to the splenic parenchyma consistent with fatty infiltration. Otherwise no acute abnormality. Musculoskeletal: No chest wall abnormality. No acute or significant osseous findings. IMPRESSION: 1. Small to moderate right pleural effusion with associated complete collapse of right middle lobe and partial collapse of right lower lobe. Several rounded hypodensities within the collapsed right middle lobe likely represents infection/inflammation with no definite findings to suggest abscess formation. Recommend short-term repeat CT chest with intravenous contrast for evaluation of resolution and exclude underlying malignancy. 2. Few scattered ground-glass airspace opacities within the of the left lower lobe could represent atelectasis versus infection/inflammation. Electronically Signed   By: MIven FinnM.D.   On: 01/04/2021 17:28   DG Chest Port 1 View  Result Date: 01/08/2021 CLINICAL DATA:  Empyema.  COVID-19 positive. EXAM: PORTABLE CHEST 1 VIEW COMPARISON:  January 06, 2021. FINDINGS: The heart size and mediastinal contours are within normal limits. Stable small right apical pneumothorax is noted. Left lung is clear. Slightly  decreased loculated right pleural effusion is noted with associated right basilar atelectasis or infiltrate. The visualized skeletal structures are unremarkable. IMPRESSION: Right hydropneumothorax is again noted with associated right basilar atelectasis or infiltrate. No pneumothorax is noted. Electronically Signed   By: JMarijo ConceptionM.D.   On: 01/08/2021 09:52   DG Chest Port 1 View  Result Date: 01/06/2021 CLINICAL DATA:  Post thoracentesis EXAM: PORTABLE CHEST  1 VIEW COMPARISON:  01/05/2021 FINDINGS: Similar moderate right pleural effusion with small pneumothorax. Adjacent right lung atelectasis. Stable left lung aeration. Stable cardiomediastinal contours. IMPRESSION: Similar right hydropneumothorax. Electronically Signed   By: Macy Mis M.D.   On: 01/06/2021 08:15   DG Chest Port 1 View  Result Date: 01/05/2021 CLINICAL DATA:  Status post thoracentesis. EXAM: PORTABLE CHEST 1 VIEW COMPARISON:  Chest x-ray 01/04/2021 FINDINGS: Status post right-sided thoracentesis with a evacuation of a moderate amount of pleural fluid. There is still a moderate-sized, likely loculated pleural effusion. There is a small postprocedural pneumothorax noted. The left lung remains relatively clear. IMPRESSION: Status post right-sided thoracentesis with residual loculated pleural fluid and a small postprocedural pneumothorax. Electronically Signed   By: Marijo Sanes M.D.   On: 01/05/2021 16:32   DG Chest Portable 1 View  Result Date: 01/04/2021 CLINICAL DATA:  Weakness EXAM: PORTABLE CHEST 1 VIEW COMPARISON:  11/17/2014 FINDINGS: Minimal airspace disease at the peripheral left lung base. At least moderate size right pleural effusion which appears loculated. Consolidation at the right middle lobe and right base. Cardiomediastinal silhouette is obscured. No pneumothorax. IMPRESSION: 1. At least moderate size right pleural effusion which appears loculated. Consolidation at the right middle lobe and right base may  reflect atelectasis or pneumonia. 2. Minimal airspace disease at the peripheral left lung base. Electronically Signed   By: Donavan Foil M.D.   On: 01/04/2021 16:20   CT IMAGE GUIDED DRAINAGE BY PERCUTANEOUS CATHETER  Result Date: 01/08/2021 CLINICAL DATA:  Empyema, planned decortication EXAM: CT GUIDED CHEST DRAIN PLACEMENT ANESTHESIA/SEDATION: Intravenous versed 2mg  were administered for anxiolysis during continuous monitoring of the patient's level of consciousness and physiological / cardiorespiratory status by the radiology RN. PROCEDURE: The procedure, risks, benefits, and alternatives were explained to the patient. Questions regarding the procedure were encouraged and answered. The patient understands and consents to the procedure. Select axial scans through the thorax were obtained. The loculated right pleural effusion was localized and an appropriate skin entry site was determined and marked. The operative field was prepped with chlorhexidinein a sterile fashion, and a sterile drape was applied covering the operative field. A sterile gown and sterile gloves were used for the procedure. Local anesthesia was provided with 1% Lidocaine. Under CT fluoroscopic guidance, a 7 cm 5 [place] Yueh sheath needle advanced into the pleural space. Amplatz guidewire advanced easily, its position confirmed on CT. Tract dilated to facilitate placement of a 14 French pigtail drain catheter, placed with into the dominant dependent aspect of the collection. Catheter position confirmed on CT. Catheter was secured externally with 0 Prolene suture and StatLock and placed to Pleur-evac -20 cm H2O drainage. Purulent material returned. The patient tolerated the procedure well. COMPLICATIONS: None immediate FINDINGS: Multiloculated right hydropneumothorax was localized. 14 French pigtail drain catheter placed in the dominant dependent component. IMPRESSION: Technically successful CT-guided chest tube placemenT. Electronically  Signed   By: Lucrezia Europe M.D.   On: 01/08/2021 13:47   US Abdomen Limited RUQ (LIVER/GB)  Result Date: 01/07/2021 CLINICAL DATA:  Transaminitis, steatohepatitis EXAM: ULTRASOUND ABDOMEN LIMITED RIGHT UPPER QUADRANT COMPARISON:  Ultrasound 07/22/2014, CT 08/15/2018 FINDINGS: Gallbladder: Top-normal gallbladder distension. No gallbladder wall thickening. Gallbladder contains some echogenic biliary sludge and few echogenic, shadowing gallstones measuring up to 3.7 mm in diameter. Sonographic Percell Miller sign is reportedly negative. Common bile duct: Diameter: 6 mm, upper limits normal. Liver: Diffusely increased hepatic echogenicity with loss of definition of the portal triads and diminished posterior through transmission compatible with  hepatic steatosis. Lobular hepatic surface contours could reflect some developing cirrhotic changes as well. No visible hepatic lesions. No intrahepatic biliary ductal dilatation is evident. Portal vein is patent on color Doppler imaging with normal direction of blood flow towards the liver. Other: Small volume perihepatic ascites and a right pleural effusion noted. IMPRESSION: 1. Diffusely increased hepatic echogenicity with loss of definition of the portal triads and diminished posterior through transmission. Findings are compatible with hepatic steatosis. 2. Lobular hepatic surface contours may reflect some developing cirrhotic changes. Particularly with small volume of perihepatic ascites. 3. Right pleural effusion. 4. Cholelithiasis and biliary sludge without sonographic evidence of acute cholecystitis. Electronically Signed   By: Lovena Le M.D.   On: 01/07/2021 02:18   US THORACENTESIS ASP PLEURAL SPACE W/IMG GUIDE  Result Date: 01/05/2021 INDICATION: Patient with history of palpitations, alcohol abuse, cervical cancer, presented to ED with weakness and shortness of breath. Request is for therapeutic and diagnostic right-sided thoracentesis EXAM: ULTRASOUND GUIDED  THERAPEUTIC AND DIAGNOSTIC RIGHT SIDED THORACENTESIS MEDICATIONS: Lidocaine 1% 10 mL COMPLICATIONS: None immediate. PROCEDURE: An ultrasound guided thoracentesis was thoroughly discussed with the patient and questions answered. The benefits, risks, alternatives and complications were also discussed. The patient understands and wishes to proceed with the procedure. Written consent was obtained. Ultrasound was performed to localize and mark an adequate pocket of fluid in the right chest. The area was then prepped and draped in the normal sterile fashion. 1% Lidocaine was used for local anesthesia. Under ultrasound guidance a 6 Fr Safe-T-Centesis catheter was introduced. Thoracentesis was performed. The catheter was removed and a dressing applied. FINDINGS: A total of approximately 200 mL of thick yellow fluid was removed. The procedure was terminated due to patient's inability to sit still in decompensation of oxygen saturation rate. Samples were sent to the laboratory as requested by the clinical team. IMPRESSION: Successful ultrasound guided right-sided therapeutic and diagnostic thoracentesis yielding 200 mL of pleural fluid. Read by: Rushie Nyhan, NP Electronically Signed   By: Miachel Roux M.D.   On: 01/05/2021 16:23      ASSESSMENT/PLAN   Acute hypoxemic respiratory failure due to COVID-19 lower respiratory tract infection -Remdesevir antiviral - pharmacy protocol 5 d -vitamin C -zinc -decadron 41m IV daily  -Self prone if patient can tolerate  -encourage to use IS and Acapella device for bronchopulmonary hygiene when able -consider Actemra if CRP increments >20 -d/c hepatotoxic medications while on remdesevir -supportive care with ICU telemetry monitoring -PT/OT when possible -procalcitonin, CRP and ferritin trending    Right lung Community acquired pneumonia with empyema    -present on admission duet to strep intermedius     - awaiting transfer for thoracic surgery evaluation at  [place]      - Surgery on case - appreciate input - chest tube management.  - s/p-US guided thoracentesis           On Unasyn IV    Right lung atelectasis   - should improve as surrounding fluid is drained   -Additionally patient should be encouraged to use incentive spirometer multiple times each hour    -Chest physiotherapy as well as occupational and physical therapy   Possible aspiration pneumonia High risk due to alcoholism history  - Currently on Zithromax and Rocephin >>> Unasyn -appreciate ID    Moderate protein calorie malnutrition  -Patient high risk for refeeding syndrome and complications during hospitalization -RD nutritional evaluation  Severe hyponatremia  -Multiple severe electrolyte derangements-see consultation   -s/p renal evaluation  Thank you for allowing me to participate in the care of this patient.   Patient/Family are satisfied with care plan and all questions have been answered.  This document was prepared using Dragon voice recognition software and may include unintentional dictation errors.     Ottie Glazier, M.D.  Division of [facility]

## 2021-01-10 ENCOUNTER — Inpatient Hospital Stay (HOSPITAL_COMMUNITY): Payer: Self-pay

## 2021-01-10 DIAGNOSIS — U071 COVID-19: Secondary | ICD-10-CM

## 2021-01-10 DIAGNOSIS — D649 Anemia, unspecified: Secondary | ICD-10-CM

## 2021-01-10 DIAGNOSIS — J869 Pyothorax without fistula: Principal | ICD-10-CM

## 2021-01-10 LAB — CBC
HCT: 23.8 % — ABNORMAL LOW (ref 36.0–46.0)
Hemoglobin: 8.2 g/dL — ABNORMAL LOW (ref 12.0–15.0)
MCH: 33.7 pg (ref 26.0–34.0)
MCHC: 34.5 g/dL (ref 30.0–36.0)
MCV: 97.9 fL (ref 80.0–100.0)
Platelets: 189 10*3/uL (ref 150–400)
RBC: 2.43 MIL/uL — ABNORMAL LOW (ref 3.87–5.11)
RDW: 15.7 % — ABNORMAL HIGH (ref 11.5–15.5)
WBC: 8.4 10*3/uL (ref 4.0–10.5)
nRBC: 0 % (ref 0.0–0.2)

## 2021-01-10 LAB — BASIC METABOLIC PANEL
Anion gap: 11 (ref 5–15)
BUN: 11 mg/dL (ref 6–20)
CO2: 22 mmol/L (ref 22–32)
Calcium: 7.3 mg/dL — ABNORMAL LOW (ref 8.9–10.3)
Chloride: 96 mmol/L — ABNORMAL LOW (ref 98–111)
Creatinine, Ser: 0.68 mg/dL (ref 0.44–1.00)
GFR, Estimated: >60 mL/min (ref >60)
Glucose, Bld: 141 mg/dL — ABNORMAL HIGH (ref 70–99)
Potassium: 3.6 mmol/L (ref 3.5–5.1)
Sodium: 129 mmol/L — ABNORMAL LOW (ref 135–145)

## 2021-01-10 LAB — MAGNESIUM: Magnesium: 1.4 mg/dL — ABNORMAL LOW (ref 1.7–2.4)

## 2021-01-10 LAB — PHOSPHORUS: Phosphorus: 3.2 mg/dL (ref 2.5–4.6)

## 2021-01-10 MED ORDER — POTASSIUM CHLORIDE CRYS ER 20 MEQ PO TBCR
40.0000 meq | EXTENDED_RELEASE_TABLET | Freq: Once | ORAL | Status: AC
  Administered 2021-01-10: 40 meq via ORAL
  Filled 2021-01-10: qty 2

## 2021-01-10 MED ORDER — COSYNTROPIN 0.25 MG IJ SOLR
0.2500 mg | Freq: Once | INTRAMUSCULAR | Status: AC
  Administered 2021-01-11: 0.25 mg via INTRAVENOUS
  Filled 2021-01-10: qty 0.25

## 2021-01-10 MED ORDER — MAGNESIUM SULFATE 4 GM/100ML IV SOLN
4.0000 g | Freq: Once | INTRAVENOUS | Status: AC
  Administered 2021-01-10: 4 g via INTRAVENOUS
  Filled 2021-01-10: qty 100

## 2021-01-10 MED ORDER — CALCIUM CARBONATE ANTACID 500 MG PO CHEW
1.0000 | CHEWABLE_TABLET | Freq: Three times a day (TID) | ORAL | Status: DC | PRN
  Administered 2021-01-10 – 2021-01-12 (×7): 200 mg via ORAL
  Filled 2021-01-10 (×7): qty 1

## 2021-01-10 MED ORDER — DIPHENHYDRAMINE HCL 25 MG PO CAPS
25.0000 mg | ORAL_CAPSULE | Freq: Three times a day (TID) | ORAL | Status: DC | PRN
  Administered 2021-01-10 – 2021-01-19 (×15): 25 mg via ORAL
  Filled 2021-01-10 (×17): qty 1

## 2021-01-10 MED ORDER — ENOXAPARIN SODIUM 40 MG/0.4ML ~~LOC~~ SOLN
40.0000 mg | SUBCUTANEOUS | Status: DC
  Administered 2021-01-10 – 2021-01-13 (×4): 40 mg via SUBCUTANEOUS
  Filled 2021-01-10 (×4): qty 0.4

## 2021-01-10 NOTE — Evaluation (Signed)
Physical Therapy Evaluation Patient Details Name: [name] MRN: [id number] DOB: 05/12/1976 Today's Date: 01/10/2021   History of Present Illness  45 y.o. female with medical history significant of alcohol abuse, tobacco use, history of cervical cancer, atopic dermatitis admitted to [facility] on 01/04/2021 for feeling weak for about 2 weeks.  She was treated for sepsis and acute hypoxic respiratory failure secondary to COVID-19 infection with superimposed bacterial infection.  She was treated with steroids and a 5-day course of remdesivir.  Work-up also revealed a right moderate pleural effusion.  Pleural fluid culture grew Streptococcus intermedius.  Patient underwent ultrasound-guided thoracentesis on 1/18 and thoracostomy under CT guidance by IR on 1/21.  Right chest tube draining mucopurulent debris.  ID was consulted and recommended antibiotics for at least 3 to 4 weeks.  She was sent to [facility] for evaluation by CT surgery for more definitive treatment of multiloculated effusion.  Clinical Impression   Pt admitted with above diagnosis. Comes from home where she lives withehr grown daughter and roommate in a single level home with a few stepsto enter; independent prior to admission; Presents to PT with mildly decr activity tolerance, pain from chest tube site; I anticipate goodfunctional progress as her medical status improves; Pt currently with functional limitations due to the deficits listed below (see PT Problem List). Pt will benefit from skilled PT to increase their independence and safety with mobility to allow discharge to the venue listed below.    Will need clarification on when her Chest Tube Pleurex can be disconnected from wall suction to allow for more progressive ambulation (even if it's laps in her room)     Follow Up Recommendations No PT follow up;Other (comment) (Is follow up for substance use disorder available to pt?)    Equipment Recommendations  Other  (comment) (I don't anticiapte the need for an assistive device for walking, but will update if it becomes necessary)    Recommendations for Other Services       Precautions / Restrictions Precautions Precautions: Other (comment) Precaution Comments: Chest tube on the Right; Covid prec      Mobility  Bed Mobility Overal bed mobility: Independent                  Transfers Overall transfer level: Needs assistance   Transfers: Sit to/from Stand Sit to Stand: Supervision         General transfer comment: cues for chest tube management  Ambulation/Gait Ambulation/Gait assistance: Min assist Gait Distance (Feet):  (March in place) Assistive device: 1 person hand held assist       General Gait Details: hesitant steps  Stairs            Wheelchair Mobility    Modified Rankin (Stroke Patients Only)       Balance Overall balance assessment: No apparent balance deficits (not formally assessed)                                           Pertinent Vitals/Pain Pain Assessment: 0-10 Pain Score: 9  Pain Location: R chest wall at Chest tube insertion Pain Intervention(s): Other (comment) (discussed pillow-splinting with coughing)    Home Living Family/patient expects to be discharged to:: Private residence Living Arrangements: Other (Comment) (roommate and adult daughter) Available Help at Discharge: Friend(s);Available PRN/intermittently Type of Home: House Home Access: Stairs to enter Entrance Stairs-Rails: Left (  unreliable) Entrance Stairs-Number of Steps: 6 Home Layout: One level Home Equipment: None      Prior Function Level of Independence: Independent               Hand Dominance   Dominant Hand: Right    Extremity/Trunk Assessment   Upper Extremity Assessment Upper Extremity Assessment: Overall WFL for tasks assessed (notable for numbness L 5th ray and 4th ray in ulnar nerve distribution)    Lower Extremity  Assessment Lower Extremity Assessment: Overall WFL for tasks assessed       Communication   Communication: No difficulties  Cognition Arousal/Alertness: Awake/alert Behavior During Therapy: WFL for tasks assessed/performed Overall Cognitive Status: Within Functional Limits for tasks assessed                                        General Comments General comments (skin integrity, edema, etc.): session conducted on room air, and O2 sats remained greaterthan or equal to 90%; serial BPs showed a drop from sit to stand, but recovered at 3 min standing; See vitals flow sheet.     Exercises     Assessment/Plan    PT Assessment Patient needs continued PT services  PT Problem List Decreased strength;Decreased activity tolerance;Decreased mobility;Decreased knowledge of use of DME;Cardiopulmonary status limiting activity       PT Treatment Interventions DME instruction;Gait training;Stair training;Functional mobility training;Therapeutic activities;Therapeutic exercise;Patient/family education    PT Goals (Current goals can be found in the Care Plan section)  Acute Rehab PT Goals Patient Stated Goal: get better PT Goal Formulation: With patient Time For Goal Achievement: 01/24/21 Potential to Achieve Goals: Good    Frequency Min 3X/week   Barriers to discharge        Co-evaluation               AM-PAC PT "6 Clicks" Mobility  Outcome Measure Help needed turning from your back to your side while in a flat bed without using bedrails?: None Help needed moving from lying on your back to sitting on the side of a flat bed without using bedrails?: None Help needed moving to and from a bed to a chair (including a wheelchair)?: A Little Help needed standing up from a chair using your arms (e.g., wheelchair or bedside chair)?: A Little Help needed to walk in hospital room?: A Little Help needed climbing 3-5 steps with a railing? : A Little 6 Click Score: 20     End of Session   Activity Tolerance: Patient tolerated treatment well Patient left: in bed;with call bell/phone within reach Nurse Communication: Mobility status PT Visit Diagnosis: Other abnormalities of gait and mobility (R26.89)    Time: 7867-6720 PT Time Calculation (min) (ACUTE ONLY): 19 min   Charges:   PT Evaluation $PT Eval Low Complexity: [place], PT  Acute Rehabilitation Services Pager [phone] Office [place] 01/10/2021, 7:35 PM

## 2021-01-10 NOTE — Progress Notes (Addendum)
[facility] PROGRESS NOTE   [name] [id number] DOB: 1976/08/30 DOA: 01/09/2021  PCP: Langston Reusing, NP  Brief History/Interval Summary: 45 y.o. female with medical history significant of alcohol abuse, tobacco use, history of cervical cancer, atopic dermatitis admitted to [facility] on 01/04/2021 for feeling weak for about 2 weeks.  She was treated for sepsis and acute hypoxic respiratory failure secondary to COVID-19 infection with superimposed bacterial infection.  She was treated with steroids and a 5-day course of remdesivir.  Work-up also revealed a right moderate pleural effusion.  Pleural fluid culture grew Streptococcus intermedius.  Patient underwent ultrasound-guided thoracentesis on 1/18 and thoracostomy under CT guidance by IR on 1/21.  Right chest tube draining mucopurulent debris.  ID was consulted and recommended antibiotics for at least 3 to 4 weeks.  She was sent to [facility] for evaluation by CT surgery for more definitive treatment of multiloculated effusion.    Reason for Visit: Empyema  Consultants: Cardiothoracic surgery  Procedures: Right chest tube placement  Antibiotics: Anti-infectives (From admission, onward)   Start     Dose/Rate Route Frequency Ordered Stop   01/09/21 2345  Ampicillin-Sulbactam (UNASYN) 3 g in sodium chloride 0.9 % 100 mL IVPB        3 g 200 mL/hr over 30 Minutes Intravenous Every 6 hours 01/09/21 2247        Subjective/Interval History: Patient complains of some pain at the chest tube site. Otherwise denies any shortness of breath. No nausea vomiting. States that she is hungry.    Assessment/Plan:  Right-sided empyema Pleural fluid cultures grew Streptococcus intermedius. Patient underwent thoracentesis on 1/18 followed by a pigtail catheter placement under CT guidance by interventional radiology on 1/21. Sent over to [facility] for evaluation by cardiothoracic surgery. Have contacted CTS this morning.  Patient was also seen by infectious disease at [facility] who recommended antibiotics for 3 to 4 weeks. Currently on Unasyn. Could be transitioned to Augmentin depending on clinical progress.  Pneumonia due to COVID-19 Completed 5-day course of Remdesivir. She has been weaned off of oxygen. Currently saturating normal on room air. Remains on dexamethasone 6 mg daily. Continue incentive spirometer.  Hyponatremia/hypomagnesemia Sodium was 114 on 1/17. Improved. Noted to be 129 this morning. 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 Replete magnesium.  History of alcohol abuse No evidence for withdrawal. Continue thiamine folate multivitamins. CIWA.  Normocytic anemia No evidence of overt bleeding. Check anemia panel.  History of hypotension Noted to be on midodrine which is being continued. Monitor blood pressures closely. Review of labs show that her cortisol level was 1.2 on 1/20 seconds. We will do cosyntropin stimulation test in the morning. We will also check thyroid function test.  Positive hep C antibody Patient also with history of alcohol use. We will check HCV titer as well as genotype. Will need to follow-up with ID in the outpatient setting. HIV screening was negative.  Moderate protein calorie malnutrition Encourage oral intake.  Tobacco abuse Nicotine patch.   DVT Prophylaxis: Has been on SCDs. No surgery planned. Will initiate DVT prophylaxis Code Status: Full code Family Communication: Discussed with the patient. Disposition Plan: Hopefully return home when improved  Status is: Inpatient  Remains inpatient appropriate because:IV treatments appropriate due to intensity of illness or inability to take PO and Inpatient level of care appropriate due to severity of illness   Dispo: The patient is from: Home  Anticipated d/c is to: Home              Anticipated d/c date is: 3 days              Patient currently is not medically stable to d/c.    Difficult to place patient No      Medications:  Scheduled: . dexamethasone (DECADRON) injection  6 mg Intravenous Q24H  . folic acid  1 mg Oral Daily  . midodrine  5 mg Oral BID WC  . multivitamin with minerals  1 tablet Oral Daily  . nicotine  14 mg Transdermal Daily  . thiamine  100 mg Oral Daily   Or  . thiamine  100 mg Intravenous Daily   Continuous: . ampicillin-sulbactam (UNASYN) IV 3 g (01/10/21 0524)  . magnesium sulfate bolus IVPB 4 g (01/10/21 0843)   [id number] carbonate, ibuprofen   Objective:  Vital Signs  Vitals:   01/09/21 1802 01/09/21 2000 01/10/21 0000 01/10/21 0957  BP: 111/90 134/64 110/71 (!) 106/55  Pulse: 66 (!) 57 77 70  Resp: 20 20 20 13   Temp: 98.4 F (36.9 C) 98.4 F (36.9 C) 98.8 F (37.1 C) 98.3 F (36.8 C)  TempSrc: Oral Oral Oral Oral  SpO2: 100% 100% 98% 100%   No intake or output data in the 24 hours ending 01/10/21 1029 There were no vitals filed for this visit.  General appearance: Awake alert.  In no distress Resp: Chest tube noted in the right side. Diminished air entry at the right base. No wheezing or rhonchi. Normal effort at rest. Cardio: S1-S2 is normal regular.  No S3-S4.  No rubs murmurs or bruit GI: Abdomen is soft.  Nontender nondistended.  Bowel sounds are present normal.  No masses organomegaly Extremities: No edema.  Full range of motion of lower extremities. Neurologic: Alert and oriented x3.  No focal neurological deficits.    Lab Results:  Data Reviewed: I have personally reviewed following labs and imaging studies  CBC: Recent Labs  Lab 01/05/21 0905 01/06/21 0526 01/07/21 0451 01/08/21 0623 01/09/21 0707 01/10/21 0127  WBC 16.7* 13.5* 9.1 10.9* 7.5 8.4  NEUTROABS 13.9* 10.6* 6.1 7.7 5.1  --   HGB 9.1* 8.6* 8.0* 7.7* 7.5* 8.2*  HCT 25.0* 23.5* 22.0* 20.8* 21.3* 23.8*  MCV 91.9 92.5 94.8 92.4 93.8 97.9  PLT 206 178 163 165 167 [id number]    Basic Metabolic Panel: Recent Labs  Lab 01/06/21 0526  01/06/21 1011 01/07/21 0410 01/07/21 0451 01/08/21 0623 01/09/21 0707 01/10/21 0127  NA  --  126* 131*  --  129* 130* 129*  K  --  3.4* 3.2*  --  3.6 3.9 3.6  CL  --  88* 89*  --  90* 96* 96*  CO2  --  28 31  --  26 25 22   GLUCOSE  --  204* 100*  --  124* 133* 141*  BUN  --  14 9  --  10 10 11   CREATININE  --  0.61 0.56  --  0.57 0.55 0.68  CALCIUM  --  8.0* 7.6*  --  7.6* 7.2* 7.3*  MG 1.7  --   --  1.5* 1.8 1.6* 1.4*  PHOS 1.8* 1.5*  --  2.9 3.5  --  3.2    GFR: Estimated Creatinine Clearance: 70.3 mL/min (by C-G formula based on SCr of 0.68 mg/dL).  Liver Function Tests: Recent Labs  Lab 01/04/21 1414 01/04/21 1546 01/06/21 1011  AST 145*  155*  --   ALT 27 31  --   ALKPHOS 96 102  --   BILITOT 4.0* 4.3*  --   PROT 6.4* 6.8  --   ALBUMIN 2.3* 2.3* 2.0*     Coagulation Profile: Recent Labs  Lab 01/04/21 1441  INR 1.3*     CBG: Recent Labs  Lab 01/05/21 1825  GLUCAP 186*     Anemia Panel: No results for input(s): VITAMINB12, FOLATE, FERRITIN, TIBC, IRON, RETICCTPCT in the last 72 hours.  Recent Results (from the past 240 hour(s))  Resp Panel by RT-PCR (Flu A&B, Covid) Nasopharyngeal Swab     Status: Abnormal   Collection Time: 01/04/21  3:40 PM   Specimen: Nasopharyngeal Swab; Nasopharyngeal(NP) swabs in vial transport medium  Result Value Ref Range Status   SARS Coronavirus 2 by RT PCR POSITIVE (A) NEGATIVE Final    Comment: RESULT CALLED TO, READ BACK BY AND VERIFIED WITH: CANDICE KIO 01/04/21 AT [id number] BY ACR (NOTE) SARS-CoV-2 target nucleic acids are DETECTED.  The SARS-CoV-2 RNA is generally detectable in upper respiratory specimens during the acute phase of infection. Positive results are indicative of the presence of the identified virus, but do not rule out bacterial infection or co-infection with other pathogens not detected by the test. Clinical correlation with patient history and other diagnostic information is necessary to determine  patient infection status. The expected result is Negative.  Fact Sheet for Patients: EntrepreneurPulse.com.au  Fact Sheet for Healthcare Providers: IncredibleEmployment.be  This test is not yet approved or cleared by the [place] FDA and  has been authorized for detection and/or diagnosis of SARS-CoV-2 by FDA under an Emergency Use Authorization (EUA).  This EUA will remain in effect (meaning this test can b e used) for the duration of  the COVID-19 declaration under Section 564(b)(1) of the Act, 21 U.S.C. section 360bbb-3(b)(1), unless the authorization is terminated or revoked sooner.     Influenza A by PCR NEGATIVE NEGATIVE Final   Influenza B by PCR NEGATIVE NEGATIVE Final    Comment: (NOTE) The Xpert Xpress SARS-CoV-2/FLU/RSV plus assay is intended as an aid in the diagnosis of influenza from Nasopharyngeal swab specimens and should not be used as a sole basis for treatment. Nasal washings and aspirates are unacceptable for Xpert Xpress SARS-CoV-2/FLU/RSV testing.  Fact Sheet for Patients: EntrepreneurPulse.com.au  Fact Sheet for Healthcare Providers: IncredibleEmployment.be  This test is not yet approved or cleared by the [place] FDA and has been authorized for detection and/or diagnosis of SARS-CoV-2 by FDA under an Emergency Use Authorization (EUA). This EUA will remain in effect (meaning this test can be used) for the duration of the COVID-19 declaration under Section 564(b)(1) of the Act, 21 U.S.C. section 360bbb-3(b)(1), unless the authorization is terminated or revoked.  Performed at [facility], [address] [place], [place] [address]   Blood culture (routine x 2)     Status: None   Collection Time: 01/04/21  3:45 PM   Specimen: BLOOD  Result Value Ref Range Status   Specimen Description BLOOD BLOOD RIGHT HAND  Final   Special Requests   Final    BOTTLES DRAWN  AEROBIC AND ANAEROBIC Blood Culture adequate volume   Culture   Final    NO GROWTH 5 DAYS Performed at [facility], [address] [place], [place] [address]    Report Status 01/09/2021 FINAL  Final  Blood culture (routine x 2)     Status: None   Collection Time:  01/04/21  3:53 PM   Specimen: BLOOD  Result Value Ref Range Status   Specimen Description BLOOD LEFT ANTECUBITAL  Final   Special Requests   Final    BOTTLES DRAWN AEROBIC AND ANAEROBIC Blood Culture adequate volume   Culture   Final    NO GROWTH 5 DAYS Performed at [facility], [address] [place], [place] [address]    Report Status 01/09/2021 FINAL  Final  Body fluid culture     Status: None   Collection Time: 01/05/21  3:55 PM   Specimen: PATH Cytology Pleural fluid  Result Value Ref Range Status   Specimen Description   Final    PLEURAL Performed at [facility], [address], [place], [place] [address]    Special Requests   Final    PLEURAL Performed at [facility], [address] [place], [place] [address]    Gram Stain   Final    ABUNDANT WBC PRESENT, PREDOMINANTLY PMN NO ORGANISMS SEEN    Culture   Final    MODERATE STREPTOCOCCUS INTERMEDIUS CRITICAL RESULT CALLED TO, READ BACK BY AND VERIFIED WITH: DR Delaine Lame @1118  01/07/21 EB Performed at [facility], [place] [address], [place], [place] [address]    Report Status 01/08/2021 FINAL  Final   Organism ID, Bacteria STREPTOCOCCUS INTERMEDIUS  Final      Susceptibility   Streptococcus intermedius - MIC*    PENICILLIN <=0.06 SENSITIVE Sensitive     CEFTRIAXONE 0.25 SENSITIVE Sensitive     ERYTHROMYCIN >=8 RESISTANT Resistant     LEVOFLOXACIN 0.5 SENSITIVE Sensitive     VANCOMYCIN 0.25 SENSITIVE Sensitive     * MODERATE STREPTOCOCCUS INTERMEDIUS  Fungus Culture With Stain     Status: None (Preliminary result)   Collection Time: 01/05/21  3:55 PM   Specimen: PATH Cytology Pleural fluid  Result  Value Ref Range Status   Fungus Stain Final report  Final    Comment: (NOTE) Performed At: [facility] [place], [place] [id number] Rush Farmer MD HA:[id number]    Fungus (Mycology) Culture PENDING  Incomplete   Fungal Source PLEURAL  Final    Comment: Performed at [facility], [address] [place], [place] [address]  Acid Fast Smear (AFB)     Status: None   Collection Time: 01/05/21  3:55 PM   Specimen: PATH Cytology Pleural fluid  Result Value Ref Range Status   AFB Specimen Processing Concentration  Final   Acid Fast Smear Negative  Final    Comment: (NOTE) Performed At: [facility] [place], [place] [id number] Rush Farmer MD [id number]    Source (AFB) PLEURAL  Final    Comment: Performed at [facility], [address] [place],  [address]  Fungus Culture Result     Status: None   Collection Time: 01/05/21  3:55 PM  Result Value Ref Range Status   Result 1 Comment  Final    Comment: (NOTE) KOH/Calcofluor preparation:  no fungus observed. Performed At: [facility] [place], [place] [id number] Rush Farmer MD EH:[id number]       Radiology Studies: DG Chest 1 View  Result Date: 01/09/2021 CLINICAL DATA:  Pleural effusion EXAM: CHEST  1 VIEW COMPARISON:  01/08/2021 FINDINGS: Right basilar lateral pigtail chest tube has been inserted. Small right pleural effusion persists. Right basilar consolidation is again noted. Small right apical pneumothorax is again seen, grossly stable since prior examination. Left lung is  clear. No pneumothorax or pleural effusion on the left. Cardiac size is within normal limits. Pulmonary vascularity is normal. IMPRESSION: Interval right chest tube placement. Persistent right basilar consolidation and small right pleural effusion. Stable right apical pneumothorax. Electronically Signed   By: Fidela Salisbury MD   On: 01/09/2021 06:05   DG  CHEST PORT 1 VIEW  Result Date: 01/10/2021 CLINICAL DATA:  History of pleural effusion.  COVID-19. EXAM: PORTABLE CHEST 1 VIEW COMPARISON:  Chest radiograph 01/09/2021. FINDINGS: Stable cardiac and mediastinal contours. Right chest tube remains in place. Redemonstrated consolidation within the right lower hemithorax. Mild interval increase in size of small right apical and lateral pneumothorax. Left lung is clear. IMPRESSION: Mild interval increase in size of small right apical and lateral pneumothorax. Right chest tube remains in place. These results will be called to the ordering clinician or representative by the Radiologist Assistant, and communication documented in the PACS or [employer]. Electronically Signed   By: Lovey Newcomer M.D.   On: 01/10/2021 08:38   CT IMAGE GUIDED DRAINAGE BY PERCUTANEOUS CATHETER  Result Date: 01/08/2021 CLINICAL DATA:  Empyema, planned decortication EXAM: CT GUIDED CHEST DRAIN PLACEMENT ANESTHESIA/SEDATION: Intravenous versed 2mg  were administered for anxiolysis during continuous monitoring of the patient's level of consciousness and physiological / cardiorespiratory status by the radiology RN. PROCEDURE: The procedure, risks, benefits, and alternatives were explained to the patient. Questions regarding the procedure were encouraged and answered. The patient understands and consents to the procedure. Select axial scans through the thorax were obtained. The loculated right pleural effusion was localized and an appropriate skin entry site was determined and marked. The operative field was prepped with chlorhexidinein a sterile fashion, and a sterile drape was applied covering the operative field. A sterile gown and sterile gloves were used for the procedure. Local anesthesia was provided with 1% Lidocaine. Under CT fluoroscopic guidance, a 7 cm 5 [place] Yueh sheath needle advanced into the pleural space. Amplatz guidewire advanced easily, its position confirmed on CT. Tract  dilated to facilitate placement of a 14 French pigtail drain catheter, placed with into the dominant dependent aspect of the collection. Catheter position confirmed on CT. Catheter was secured externally with 0 Prolene suture and StatLock and placed to Pleur-evac -20 cm H2O drainage. Purulent material returned. The patient tolerated the procedure well. COMPLICATIONS: None immediate FINDINGS: Multiloculated right hydropneumothorax was localized. 14 French pigtail drain catheter placed in the dominant dependent component. IMPRESSION: Technically successful CT-guided chest tube placemenT. Electronically Signed   By: Lucrezia Europe M.D.   On: 01/08/2021 13:47       LOS: 1 day   [facility] Pager on [email or web address]  01/10/2021, 10:29 AM

## 2021-01-10 NOTE — Consult Note (Signed)
Reason for Consult:Empyema Referring Physician: [facility]  [name] is an 45 y.o. female.  HPI: 45 yo woman transferred from [place] for management of empyema  [name] is a 45 yo woman with numerous medical issues and polysubstance abuse including ethanol and IV drugs. Presented to [facility] on 1/17 with c/o fatigue and general malaise. Treated for sepsis and COVID- 19 with IV antibiotics and remdesivir. CXR showed a moderate right pleural effusion. Thoracentesis- pleural fluid grew Strep intermedius. IR placed pigtail catheter on 1/21. Catheter draining purulent fluid. Currently on Unasyn. She does feel better since treatment initiated. Some pain at tube site.  Past Medical History:  Diagnosis Date  . Abnormal Pap smear    Age 40  . 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 Alcohol abuse   . Anxiety   . Bacterial infection   . Cervical intraepithelial neoplasia III   . 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 Condyloma 2011  . H/O fatigue 2009  . H/O varicella   . Headache(784.0)   . HPV (human papilloma virus) anogenital infection   . Hx of dizziness 09/20/2011  . Hx: UTI (urinary tract infection)    Back pain  . Irregular menstrual cycle   . IV drug user   . Palpitations 2010  . Pelvic pain in female 12/05/11  . 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    Age 59  . Trichomonas   . Yeast infection     Past Surgical History:  Procedure Laterality Date  . A*wisdom teeth ext    . CERVICAL CONIZATION W/BX  11/30/2011   Procedure: CONIZATION CERVIX WITH BIOPSY;  Surgeon: [name], MD;  Location: [facility];  Service: Gynecology;  Laterality: N/A;  . CERVICAL CONIZATION W/BX  03/16/2012   Procedure: CONIZATION CERVIX WITH BIOPSY;  Surgeon: Ena Dawley, MD;  Location: [facility];  Service: Gynecology;  Laterality: N/A;  . CONIZATION CERVIX     x 3  . DILATION AND CURETTAGE OF UTERUS  11/30/2011   Procedure:  DILATATION AND CURETTAGE;  Surgeon: [name], MD;  Location: [facility];  Service: Gynecology;  Laterality: N/A;  . svd     x 1    Family History  Problem Relation Age of Onset  . Cervical cancer Mother   . Breast cancer Mother   . Cancer Mother 66       Breast & cervical   . Stroke Maternal Grandmother   . COPD Maternal Grandfather        Emphysema  . Hypertension Paternal Grandfather   . Stroke Paternal Grandfather   . Cancer Father 60       prostate    Social History:  reports that she has been smoking cigarettes. She has smoked for the past 30.00 years. She has quit using smokeless tobacco. She reports current alcohol use of about 8.0 - 10.0 standard drinks of alcohol per week. She reports previous drug use. Drug: Heroin.  Allergies:  Allergies  Allergen Reactions  . Acetaminophen     Intolerance, hx of tylenol OD     Medications:  Scheduled: . dexamethasone (DECADRON) injection  6 mg Intravenous Q24H  . folic acid  1 mg Oral Daily  . midodrine  5 mg Oral BID WC  . multivitamin with minerals  1 tablet Oral Daily  . nicotine  14 mg Transdermal Daily  . thiamine  100 mg Oral Daily   Or  . thiamine  100 mg Intravenous Daily    Results for orders placed or performed during the hospital encounter of 01/09/21 (from the past 48 hour(s))  Type and screen [place]     Status: None   Collection Time: 01/09/21  8:26 PM  Result Value Ref Range   ABO/RH(D) A POS    Antibody Screen NEG    Sample Expiration      01/12/2021,2359 Performed at [facility], [place] [address] [place], [place] [address]   ABO/Rh     Status: None   Collection Time: 01/09/21  9:24 PM  Result Value Ref Range   ABO/RH(D)      A POS Performed at [facility] [address], [place], [place] [address]   CBC     Status: Abnormal   Collection Time: 01/10/21  1:27 AM  Result Value Ref Range   WBC 8.4 4.0 - 10.5 K/uL   RBC 2.43 (L) 3.87 - 5.11 MIL/uL    Hemoglobin 8.2 (L) 12.0 - 15.0 g/dL   HCT 23.8 (L) 36.0 - 46.0 %   MCV 97.9 80.0 - 100.0 fL   MCH 33.7 26.0 - 34.0 pg   MCHC 34.5 30.0 - 36.0 g/dL   RDW 15.7 (H) 11.5 - 15.5 %   Platelets 189 150 - 400 K/uL   nRBC 0.0 0.0 - 0.2 %    Comment: Performed at [facility], [place] [address], [place], [place] [id number]  Basic metabolic panel     Status: Abnormal   Collection Time: 01/10/21  1:27 AM  Result Value Ref Range   Sodium 129 (L) 135 - 145 mmol/L   Potassium 3.6 3.5 - 5.1 mmol/L   Chloride 96 (L) 98 - 111 mmol/L   CO2 22 22 - 32 mmol/L   Glucose, Bld 141 (H) 70 - 99 mg/dL    Comment: Glucose reference range applies only to samples taken after fasting for at least 8 hours.   BUN 11 6 - 20 mg/dL   Creatinine, Ser 0.68 0.44 - 1.00 mg/dL   Calcium 7.3 (L) 8.9 - 10.3 mg/dL   GFR, Estimated >60 >60 mL/min    Comment: (NOTE) Calculated using the CKD-EPI Creatinine Equation (2021)    Anion gap 11 5 - 15    Comment: Performed at [facility] [address], [place], [place] [address]  Magnesium     Status: Abnormal   Collection Time: 01/10/21  1:27 AM  Result Value Ref Range   Magnesium 1.4 (L) 1.7 - 2.4 mg/dL    Comment: Performed at [facility] [address] [place], [place] [address]  Phosphorus     Status: None   Collection Time: 01/10/21  1:27 AM  Result Value Ref Range   Phosphorus 3.2 2.5 - 4.6 mg/dL    Comment: Performed at [facility] [address], [place], [place] [address]    DG Chest 1 View  Result Date: 01/09/2021 CLINICAL DATA:  Pleural effusion EXAM: CHEST  1 VIEW COMPARISON:  01/08/2021 FINDINGS: Right basilar lateral pigtail chest tube has been inserted. Small right pleural effusion persists. Right basilar consolidation is again noted. Small right apical pneumothorax is again seen, grossly stable since prior examination. Left lung is clear. No pneumothorax or pleural effusion on the left. Cardiac size is within normal limits. Pulmonary  vascularity is normal. IMPRESSION: Interval right chest tube placement. Persistent right basilar consolidation and small right pleural effusion.  Stable right apical pneumothorax. Electronically Signed   By: Fidela Salisbury MD   On: 01/09/2021 06:05   DG CHEST PORT 1 VIEW  Result Date: 01/10/2021 CLINICAL DATA:  History of pleural effusion.  COVID-19. EXAM: PORTABLE CHEST 1 VIEW COMPARISON:  Chest radiograph 01/09/2021. FINDINGS: Stable cardiac and mediastinal contours. Right chest tube remains in place. Redemonstrated consolidation within the right lower hemithorax. Mild interval increase in size of small right apical and lateral pneumothorax. Left lung is clear. IMPRESSION: Mild interval increase in size of small right apical and lateral pneumothorax. Right chest tube remains in place. These results will be called to the ordering clinician or representative by the Radiologist Assistant, and communication documented in the PACS or [employer]. Electronically Signed   By: Lovey Newcomer M.D.   On: 01/10/2021 08:38   CT IMAGE GUIDED DRAINAGE BY PERCUTANEOUS CATHETER  Result Date: 01/08/2021 CLINICAL DATA:  Empyema, planned decortication EXAM: CT GUIDED CHEST DRAIN PLACEMENT ANESTHESIA/SEDATION: Intravenous versed 2mg  were administered for anxiolysis during continuous monitoring of the patient's level of consciousness and physiological / cardiorespiratory status by the radiology RN. PROCEDURE: The procedure, risks, benefits, and alternatives were explained to the patient. Questions regarding the procedure were encouraged and answered. The patient understands and consents to the procedure. Select axial scans through the thorax were obtained. The loculated right pleural effusion was localized and an appropriate skin entry site was determined and marked. The operative field was prepped with chlorhexidinein a sterile fashion, and a sterile drape was applied covering the operative field. A sterile gown and sterile  gloves were used for the procedure. Local anesthesia was provided with 1% Lidocaine. Under CT fluoroscopic guidance, a 7 cm 5 [place] Yueh sheath needle advanced into the pleural space. Amplatz guidewire advanced easily, its position confirmed on CT. Tract dilated to facilitate placement of a 14 French pigtail drain catheter, placed with into the dominant dependent aspect of the collection. Catheter position confirmed on CT. Catheter was secured externally with 0 Prolene suture and StatLock and placed to Pleur-evac -20 cm H2O drainage. Purulent material returned. The patient tolerated the procedure well. COMPLICATIONS: None immediate FINDINGS: Multiloculated right hydropneumothorax was localized. 14 French pigtail drain catheter placed in the dominant dependent component. IMPRESSION: Technically successful CT-guided chest tube placemenT. Electronically Signed   By: Lucrezia Europe M.D.   On: 01/08/2021 13:47  I personally reviewed her CT and CXR images. Findings of complex right pleural effusion. Marked improvement post tube placement.  Review of Systems  Constitutional: Positive for activity change and fatigue.  Respiratory: Positive for cough.    Blood pressure (!) 106/55, pulse 70, temperature 98.3 F (36.8 C), temperature source Oral, resp. rate 13, SpO2 100 %. Physical Exam Vitals reviewed.  Constitutional:      General: She is not in acute distress. HENT:     Head: Normocephalic and atraumatic.  Eyes:     Extraocular Movements: Extraocular movements intact.  Cardiovascular:     Rate and Rhythm: Normal rate and regular rhythm.  Pulmonary:     Effort: Pulmonary effort is normal.     Comments: Slightly diminished right base. Right Chest drain in place Abdominal:     General: There is no distension.     Palpations: Abdomen is soft.  Lymphadenopathy:     Cervical: No cervical adenopathy.  Skin:    General: Skin is warm and dry.  Neurological:     General: No focal deficit present.      Mental Status: She is  alert and oriented to person, place, and time.     Assessment/Plan: 45 yo woman with a history of polysubstance abuse who presents with fatigue and malaise. + COVID -19 and treated with Remdesivir. Also with community acquired pneumonia complicated by empyema with Strep intermedius. Currently on Unasyn  Since placement of the pleural catheter  she has drained a moderate amount of purulent fluid. Her chest Xray is significantly improved. Her fever has resolved. WBC normalized  There is no indication for surgery.  I think we should repeat a CT chest to see if there are any undrained areas. If so can probably be managed with intrapleural thrombolytics  Melrose Nakayama 01/10/2021, 10:13 AM

## 2021-01-11 DIAGNOSIS — U071 COVID-19: Secondary | ICD-10-CM | POA: Diagnosis not present

## 2021-01-11 DIAGNOSIS — D649 Anemia, unspecified: Secondary | ICD-10-CM | POA: Diagnosis not present

## 2021-01-11 DIAGNOSIS — J869 Pyothorax without fistula: Secondary | ICD-10-CM | POA: Diagnosis not present

## 2021-01-11 LAB — TSH: TSH: 7.972 u[IU]/mL — ABNORMAL HIGH (ref 0.350–4.500)

## 2021-01-11 LAB — COMPREHENSIVE METABOLIC PANEL
ALT: 51 U/L — ABNORMAL HIGH (ref 0–44)
AST: 111 U/L — ABNORMAL HIGH (ref 15–41)
Albumin: 2 g/dL — ABNORMAL LOW (ref 3.5–5.0)
Alkaline Phosphatase: 63 U/L (ref 38–126)
Anion gap: 8 (ref 5–15)
BUN: 10 mg/dL (ref 6–20)
CO2: 24 mmol/L (ref 22–32)
Calcium: 7.8 mg/dL — ABNORMAL LOW (ref 8.9–10.3)
Chloride: 97 mmol/L — ABNORMAL LOW (ref 98–111)
Creatinine, Ser: 0.61 mg/dL (ref 0.44–1.00)
GFR, Estimated: >60 mL/min (ref >60)
Glucose, Bld: 87 mg/dL (ref 70–99)
Potassium: 3.7 mmol/L (ref 3.5–5.1)
Sodium: 129 mmol/L — ABNORMAL LOW (ref 135–145)
Total Bilirubin: 1 mg/dL (ref 0.3–1.2)
Total Protein: 6 g/dL — ABNORMAL LOW (ref 6.5–8.1)

## 2021-01-11 LAB — CBC
HCT: 25.2 % — ABNORMAL LOW (ref 36.0–46.0)
Hemoglobin: 8.6 g/dL — ABNORMAL LOW (ref 12.0–15.0)
MCH: 33.5 pg (ref 26.0–34.0)
MCHC: 34.1 g/dL (ref 30.0–36.0)
MCV: 98.1 fL (ref 80.0–100.0)
Platelets: 239 10*3/uL (ref 150–400)
RBC: 2.57 MIL/uL — ABNORMAL LOW (ref 3.87–5.11)
RDW: 16.3 % — ABNORMAL HIGH (ref 11.5–15.5)
WBC: 9.3 10*3/uL (ref 4.0–10.5)
nRBC: 0 % (ref 0.0–0.2)

## 2021-01-11 LAB — MAGNESIUM: Magnesium: 1.7 mg/dL (ref 1.7–2.4)

## 2021-01-11 LAB — T4, FREE: Free T4: 0.85 ng/dL (ref 0.61–1.12)

## 2021-01-11 MED ORDER — METHOCARBAMOL 500 MG PO TABS
500.0000 mg | ORAL_TABLET | Freq: Once | ORAL | Status: AC
  Administered 2021-01-11: 500 mg via ORAL
  Filled 2021-01-11: qty 1

## 2021-01-11 MED ORDER — ENSURE ENLIVE PO LIQD
237.0000 mL | Freq: Two times a day (BID) | ORAL | Status: DC
  Administered 2021-01-12 – 2021-01-18 (×7): 237 mL via ORAL

## 2021-01-11 MED ORDER — DORNASE ALFA 2.5 MG/2.5ML IN SOLN
5.0000 mg | Freq: Two times a day (BID) | RESPIRATORY_TRACT | Status: AC
  Administered 2021-01-11: 5 mg via INTRAPLEURAL
  Filled 2021-01-11 (×2): qty 5

## 2021-01-11 MED ORDER — SODIUM CHLORIDE (PF) 0.9 % IJ SOLN
10.0000 mg | Freq: Two times a day (BID) | INTRAMUSCULAR | Status: AC
  Administered 2021-01-11: 10 mg via INTRAPLEURAL
  Filled 2021-01-11 (×2): qty 10

## 2021-01-11 MED ORDER — LOPERAMIDE HCL 2 MG PO CAPS
2.0000 mg | ORAL_CAPSULE | ORAL | Status: DC | PRN
  Administered 2021-01-11: 2 mg via ORAL
  Filled 2021-01-11: qty 1

## 2021-01-11 MED ORDER — IBUPROFEN 600 MG PO TABS
600.0000 mg | ORAL_TABLET | Freq: Four times a day (QID) | ORAL | Status: DC | PRN
  Administered 2021-01-11 – 2021-01-13 (×9): 600 mg via ORAL
  Filled 2021-01-11 (×10): qty 1

## 2021-01-11 MED ORDER — MAGNESIUM SULFATE 2 GM/50ML IV SOLN
2.0000 g | Freq: Once | INTRAVENOUS | Status: AC
  Administered 2021-01-11: 2 g via INTRAVENOUS
  Filled 2021-01-11: qty 50

## 2021-01-11 MED ORDER — SACCHAROMYCES BOULARDII 250 MG PO CAPS
250.0000 mg | ORAL_CAPSULE | Freq: Two times a day (BID) | ORAL | Status: DC
  Administered 2021-01-11 – 2021-01-19 (×17): 250 mg via ORAL
  Filled 2021-01-11 (×17): qty 1

## 2021-01-11 NOTE — Evaluation (Signed)
Occupational Therapy Evaluation Patient Details Name: [name] MRN: [id number] DOB: 29-Jul-1976 Today's Date: 01/11/2021    History of Present Illness 45 y.o. female with medical history significant of alcohol abuse, tobacco use, history of cervical cancer, atopic dermatitis admitted to [facility] on 01/04/2021 for feeling weak for about 2 weeks.  She was treated for sepsis and acute hypoxic respiratory failure secondary to COVID-19 infection with superimposed bacterial infection.  She was treated with steroids and a 5-day course of remdesivir.  Work-up also revealed a right moderate pleural effusion.  Pleural fluid culture grew Streptococcus intermedius.  Patient underwent ultrasound-guided thoracentesis on 1/18 and thoracostomy under CT guidance by IR on 1/21.  Right chest tube draining mucopurulent debris.  ID was consulted and recommended antibiotics for at least 3 to 4 weeks.  She was sent to [facility] for evaluation by CT surgery for more definitive treatment of multiloculated effusion.   Clinical Impression   Pt PTA: pt recently in [facility] hospital for COVID and transferred to [facility]. Pt limited by decreased strength, decreased ability to care for self safely and decreased activity tolerance. Pt set-upA to minguardA for ADL. Pt supervisionA to minguardA with mobility. Pt's VSS. O2 on RA >89% O2 with exertion. HR 90-115 BPm with exertion. Pt would benefit from continued OT skilled services for ADL, mobility and energy conservation. OT following acutely.     Follow Up Recommendations  No OT follow up;Supervision - Intermittent    Equipment Recommendations  None recommended by OT    Recommendations for Other Services       Precautions / Restrictions Precautions Precautions: Other (comment) Precaution Comments: Chest tube on the Right; Covid prec Restrictions Weight Bearing Restrictions: No      Mobility Bed Mobility Overal bed mobility: Independent                   Transfers Overall transfer level: Needs assistance   Transfers: Sit to/from Stand Sit to Stand: Supervision         General transfer comment: cues for chest tube management    Balance Overall balance assessment: No apparent balance deficits (not formally assessed)                                         ADL either performed or assessed with clinical judgement   ADL Overall ADL's : Needs assistance/impaired Eating/Feeding: Set up;Sitting   Grooming: Supervision/safety;Standing   Upper Body Bathing: Supervision/ safety;Standing   Lower Body Bathing: Min guard;Sitting/lateral leans;Sit to/from stand   Upper Body Dressing : Supervision/safety;Standing   Lower Body Dressing: Min guard;Sitting/lateral leans;Sit to/from stand   Toilet Transfer: Min guard;Cueing for safety;Ambulation   Toileting- Clothing Manipulation and Hygiene: Min guard;Sitting/lateral lean;Sit to/from stand       Functional mobility during ADLs: Min guard;Rolling walker;Cueing for safety General ADL Comments: Pt limited by decreased strength, decreased ability to care for self safely and decreased activity tolerance. Pt's VSS. O2 on RA >89% O2 with exertion. HR 90-115 BPm with exertion.     Vision Baseline Vision/History: Wears glasses Patient Visual Report: No change from baseline Vision Assessment?: No apparent visual deficits     Perception     Praxis      Pertinent Vitals/Pain Pain Assessment: 0-10 Pain Score: 9  Pain Location: R chest wall at Chest tube insertion Pain Intervention(s): Monitored during session;Premedicated before session;Repositioned  Hand Dominance Right   Extremity/Trunk Assessment Upper Extremity Assessment Upper Extremity Assessment: Overall WFL for tasks assessed RUE Deficits / Details: 4/5 MM grade; AROM, WFLs LUE Deficits / Details: L 4th and 5th digit sensation deficits   Lower Extremity Assessment Lower Extremity Assessment: Overall  WFL for tasks assessed   Cervical / Trunk Assessment Cervical / Trunk Assessment: Normal   Communication Communication Communication: No difficulties   Cognition Arousal/Alertness: Awake/alert Behavior During Therapy: WFL for tasks assessed/performed Overall Cognitive Status: Within Functional Limits for tasks assessed                                     General Comments  O2 on RA >90% with exertion; HR 62 BPM at rest; 92 BPM with exertion    Exercises     Shoulder Instructions      Home Living Family/patient expects to be discharged to:: Private residence Living Arrangements: Other (Comment) (roommate and adult daughter) Available Help at Discharge: Friend(s);Available PRN/intermittently Type of Home: House Home Access: Stairs to enter [employer] of Steps: 6 Entrance Stairs-Rails: Left (unreliable) Home Layout: One level     Bathroom Shower/Tub: Tub/shower unit         Home Equipment: None          Prior Functioning/Environment Level of Independence: Independent        Comments: Pt reports previous falls "there was a lot of blood. the police came once. I don't remember falling or the cause."        OT Problem List: Decreased strength;Decreased activity tolerance;Decreased safety awareness;Decreased knowledge of use of DME or AE;Cardiopulmonary status limiting activity;Pain      OT Treatment/Interventions: Self-care/ADL training;Therapeutic exercise;Energy conservation;DME and/or AE instruction;Therapeutic activities;Cognitive remediation/compensation;Patient/family education;Balance training    OT Goals(Current goals can be found in the care plan section) Acute Rehab OT Goals Patient Stated Goal: get better OT Goal Formulation: With patient Potential to Achieve Goals: Good ADL Goals Pt Will Perform Lower Body Dressing: with modified independence;sitting/lateral leans;sit to/from stand Additional ADL Goal #1: Pt will perform  x10 mins of OOB ADL with modified independence.  OT Frequency: Min 2X/week   Barriers to D/C:            Co-evaluation              AM-PAC OT "6 Clicks" Daily Activity     Outcome Measure Help from another person eating meals?: None Help from another person taking care of personal grooming?: A Little Help from another person toileting, which includes using toliet, bedpan, or urinal?: A Little Help from another person bathing (including washing, rinsing, drying)?: A Little Help from another person to put on and taking off regular upper body clothing?: None Help from another person to put on and taking off regular lower body clothing?: A Little 6 Click Score: 20   End of Session Equipment Utilized During Treatment: Gait belt;Rolling walker Nurse Communication: Mobility status  Activity Tolerance: Patient tolerated treatment well Patient left: in bed;with call bell/phone within reach  OT Visit Diagnosis: Unsteadiness on feet (R26.81);Muscle weakness (generalized) (M62.81)                Time: [id number] OT Time Calculation (min): 39 min Charges:  OT General Charges $OT Visit: 1 Visit OT Evaluation $OT Eval Moderate Complexity: 1 Mod OT Treatments $Self Care/Home Management : 8-22 mins $Therapeutic Activity: 8-22 mins  Ebony Hail C, OTR/L Acute  Rehabilitation Services Pager: [phone] Office: [phone]   Jefferey Pica 01/11/2021, 5:01 PM

## 2021-01-11 NOTE — Progress Notes (Addendum)
Jed[place], [place] about large amount of output (over 500cc) from right chest tube. Patient with increased pain and some drainage around catheter entry site. New bandages placed. Drainage bright to dark red. Will continue to monitor.  -Donnelly Angelica, RN

## 2021-01-11 NOTE — Progress Notes (Signed)
      [address]       [place] [address]             [phone]     Asked to see patient as had a fairly large amount of bloody drainage, appears to be about 500 cc following thrombolytics. . Vitals have remained stable and only c/o of is pain. On inspection the drainage appears to have slowed . Will monitor clinically and repeat CBC/CXR in am. Also reduced suction to 10 cm H2O.  John Giovanni, PA-C

## 2021-01-11 NOTE — Progress Notes (Signed)
[facility] PROGRESS NOTE   [name] SWN:[id number] DOB: May 17, 1976 DOA: 01/09/2021  PCP: Langston Reusing, NP  Brief History/Interval Summary: 45 y.o. female with medical history significant of alcohol abuse, tobacco use, history of cervical cancer, atopic dermatitis admitted to [facility] on 01/04/2021 for feeling weak for about 2 weeks.  She was treated for sepsis and acute hypoxic respiratory failure secondary to COVID-19 infection with superimposed bacterial infection.  She was treated with steroids and a 5-day course of remdesivir.  Work-up also revealed a right moderate pleural effusion.  Pleural fluid culture grew Streptococcus intermedius.  Patient underwent ultrasound-guided thoracentesis on 1/18 and thoracostomy under CT guidance by IR on 1/21.  Right chest tube draining mucopurulent debris.  ID was consulted and recommended antibiotics for at least 3 to 4 weeks.  She was sent to [facility] for evaluation by CT surgery for more definitive treatment of multiloculated effusion.    Reason for Visit: Empyema  Consultants: Cardiothoracic surgery  Procedures: Right chest tube placement  Antibiotics: Anti-infectives (From admission, onward)   Start     Dose/Rate Route Frequency Ordered Stop   01/09/21 2345  Ampicillin-Sulbactam (UNASYN) 3 g in sodium chloride 0.9 % 100 mL IVPB        3 g 200 mL/hr over 30 Minutes Intravenous Every 6 hours 01/09/21 2247        Subjective/Interval History: Patient continues to complain of pain in the right chest area.  Denies any nausea vomiting.  No shortness of breath.  Cough with brownish expectoration    Assessment/Plan:  Right-sided empyema Pleural fluid cultures grew Streptococcus intermedius. Patient underwent thoracentesis on 1/18 followed by a pigtail catheter placement under CT guidance by interventional radiology on 1/21. Sent over to [facility] for evaluation by cardiothoracic surgery.  Cardiothoracic surgery is  following.  Defer management of chest tube to them. Patient was also seen by infectious disease at [facility] who recommended antibiotics for 3 to 4 weeks. Currently on Unasyn. Could be transitioned to Augmentin depending on clinical progress. Initiate probiotics.  Pneumonia due to COVID-19 Completed 5-day course of Remdesivir. She has been weaned off of oxygen. Currently saturating normal on room air. Remains on dexamethasone 6 mg daily. Continue incentive spirometer.  Hyponatremia/hypomagnesemia Sodium was 114 on 1/17. Improved. Noted to be stable.  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  Magnesium was repleted.  Noted to be 1.7.  Will give additional dose.  History of alcohol abuse No evidence for withdrawal. Continue thiamine folate multivitamins. CIWA.  Normocytic anemia No evidence of overt bleeding. Anemia panel is pending.  Hemoglobin is stable.  History of hypotension Noted to be on midodrine which is being continued.  Noted to be stable.   Cosyntropin stimulation test results pending.  Cortisol level was noted to be 1.2  TSH noted to be 7.9 with a free T4 which is normal at 0.85.  To be rechecked in the outpatient setting in a few weeks.    Positive hep C antibody Patient also with history of alcohol use.  HCV titer and genotype pending.  HIV screening was negative.  Will need outpatient follow-up with ID.    Moderate protein calorie malnutrition Encourage oral intake.  Tobacco abuse Nicotine patch.   DVT Prophylaxis: Lovenox Code Status: Full code Family Communication: Discussed with the patient. Disposition Plan: Hopefully return home when improved  Status is: Inpatient  Remains inpatient appropriate because:IV treatments appropriate due to intensity of illness or inability to take PO  and Inpatient level of care appropriate due to severity of illness   Dispo: The patient is from: Home              Anticipated d/c is to: Home              Anticipated d/c  date is: 3 days              Patient currently is not medically stable to d/c.   Difficult to place patient No      Medications:  Scheduled: . alteplase (TPA) for intrapleural administration  10 mg Intrapleural Q12H   And  . pulmozyme (DORNASE) for intrapleural administration  5 mg Intrapleural Q12H  . dexamethasone (DECADRON) injection  6 mg Intravenous Q24H  . enoxaparin (LOVENOX) injection  40 mg Subcutaneous Q24H  . folic acid  1 mg Oral Daily  . midodrine  5 mg Oral BID WC  . multivitamin with minerals  1 tablet Oral Daily  . nicotine  14 mg Transdermal Daily  . thiamine  100 mg Oral Daily   Continuous: . ampicillin-sulbactam (UNASYN) IV 3 g (01/11/21 1020)   [id number] carbonate, diphenhydrAMINE, ibuprofen, loperamide   Objective:  Vital Signs  Vitals:   01/10/21 1644 01/10/21 1953 01/10/21 2000 01/11/21 0841  BP: (!) 101/56 (!) 106/55  116/63  Pulse:  71  76  Resp: 18 19  18   Temp: 98.6 F (37 C) 98.4 F (36.9 C) 98.4 F (36.9 C) 98.4 F (36.9 C)  TempSrc: Oral Oral  Oral  SpO2: 100% 97%  100%    Intake/Output Summary (Last 24 hours) at 01/11/2021 1123 Last data filed at 01/10/2021 2000 Gross per 24 hour  Intake 400 ml  Output 80 ml  Net 320 ml   There were no vitals filed for this visit.  General appearance: Awake alert.  In no distress Resp: Chest tube in the right side.  Diminished air entry at the right base.  No wheezing or rhonchi.  Normal effort at rest. Cardio: S1-S2 is normal regular.  No S3-S4.  No rubs murmurs or bruit GI: Abdomen is soft.  Nontender nondistended.  Bowel sounds are present normal.  No masses organomegaly Extremities: No edema.  Full range of motion of lower extremities. Neurologic: Alert and oriented x3.  No focal neurological deficits.      Lab Results:  Data Reviewed: I have personally reviewed following labs and imaging studies  CBC: Recent Labs  Lab 01/05/21 0905 01/06/21 0526 01/07/21 0451 01/08/21 0623  01/09/21 0707 01/10/21 0127 01/11/21 0700  WBC 16.7* 13.5* 9.1 10.9* 7.5 8.4 9.3  NEUTROABS 13.9* 10.6* 6.1 7.7 5.1  --   --   HGB 9.1* 8.6* 8.0* 7.7* 7.5* 8.2* 8.6*  HCT 25.0* 23.5* 22.0* 20.8* 21.3* 23.8* 25.2*  MCV 91.9 92.5 94.8 92.4 93.8 97.9 98.1  PLT 206 178 163 165 167 189 [id number]    Basic Metabolic Panel: Recent Labs  Lab 01/06/21 0526 01/06/21 1011 01/07/21 0410 01/07/21 0451 01/08/21 0623 01/09/21 0707 01/10/21 0127 01/11/21 0700  NA  --  126* 131*  --  129* 130* 129* 129*  K  --  3.4* 3.2*  --  3.6 3.9 3.6 3.7  CL  --  88* 89*  --  90* 96* 96* 97*  CO2  --  28 31  --  26 25 22 24   GLUCOSE  --  204* 100*  --  124* 133* 141* 87  BUN  --  14 9  --  10 10 11 10   CREATININE  --  0.61 0.56  --  0.57 0.55 0.68 0.61  CALCIUM  --  8.0* 7.6*  --  7.6* 7.2* 7.3* 7.8*  MG 1.7  --   --  1.5* 1.8 1.6* 1.4* 1.7  PHOS 1.8* 1.5*  --  2.9 3.5  --  3.2  --     GFR: Estimated Creatinine Clearance: 70.3 mL/min (by C-G formula based on SCr of 0.61 mg/dL).  Liver Function Tests: Recent Labs  Lab 01/04/21 1414 01/04/21 1546 01/06/21 1011 01/11/21 0700  AST 145* 155*  --  111*  ALT 27 31  --  51*  ALKPHOS 96 102  --  63  BILITOT 4.0* 4.3*  --  1.0  PROT 6.4* 6.8  --  6.0*  ALBUMIN 2.3* 2.3* 2.0* 2.0*     Coagulation Profile: Recent Labs  Lab 01/04/21 1441  INR 1.3*     CBG: Recent Labs  Lab 01/05/21 1825  GLUCAP 186*     Anemia Panel: No results for input(s): VITAMINB12, FOLATE, FERRITIN, TIBC, IRON, RETICCTPCT in the last 72 hours.  Recent Results (from the past 240 hour(s))  Resp Panel by RT-PCR (Flu A&B, Covid) Nasopharyngeal Swab     Status: Abnormal   Collection Time: 01/04/21  3:40 PM   Specimen: Nasopharyngeal Swab; Nasopharyngeal(NP) swabs in vial transport medium  Result Value Ref Range Status   SARS Coronavirus 2 by RT PCR POSITIVE (A) NEGATIVE Final    Comment: RESULT CALLED TO, READ BACK BY AND VERIFIED WITH: CANDICE KIO 01/04/21 AT [id number] BY  ACR (NOTE) SARS-CoV-2 target nucleic acids are DETECTED.  The SARS-CoV-2 RNA is generally detectable in upper respiratory specimens during the acute phase of infection. Positive results are indicative of the presence of the identified virus, but do not rule out bacterial infection or co-infection with other pathogens not detected by the test. Clinical correlation with patient history and other diagnostic information is necessary to determine patient infection status. The expected result is Negative.  Fact Sheet for Patients: EntrepreneurPulse.com.au  Fact Sheet for Healthcare Providers: IncredibleEmployment.be  This test is not yet approved or cleared by the [place] FDA and  has been authorized for detection and/or diagnosis of SARS-CoV-2 by FDA under an Emergency Use Authorization (EUA).  This EUA will remain in effect (meaning this test can b e used) for the duration of  the COVID-19 declaration under Section 564(b)(1) of the Act, 21 U.S.C. section 360bbb-3(b)(1), unless the authorization is terminated or revoked sooner.     Influenza A by PCR NEGATIVE NEGATIVE Final   Influenza B by PCR NEGATIVE NEGATIVE Final    Comment: (NOTE) The Xpert Xpress SARS-CoV-2/FLU/RSV plus assay is intended as an aid in the diagnosis of influenza from Nasopharyngeal swab specimens and should not be used as a sole basis for treatment. Nasal washings and aspirates are unacceptable for Xpert Xpress SARS-CoV-2/FLU/RSV testing.  Fact Sheet for Patients: EntrepreneurPulse.com.au  Fact Sheet for Healthcare Providers: IncredibleEmployment.be  This test is not yet approved or cleared by the [place] FDA and has been authorized for detection and/or diagnosis of SARS-CoV-2 by FDA under an Emergency Use Authorization (EUA). This EUA will remain in effect (meaning this test can be used) for the duration of the COVID-19  declaration under Section 564(b)(1) of the Act, 21 U.S.C. section 360bbb-3(b)(1), unless the authorization is terminated or revoked.  Performed at [facility], [address] [place], [place] [address]   Blood culture (routine  x 2)     Status: None   Collection Time: 01/04/21  3:45 PM   Specimen: BLOOD  Result Value Ref Range Status   Specimen Description BLOOD BLOOD RIGHT HAND  Final   Special Requests   Final    BOTTLES DRAWN AEROBIC AND ANAEROBIC Blood Culture adequate volume   Culture   Final    NO GROWTH 5 DAYS Performed at [facility], [address], [place], [place] [address]    Report Status 01/09/2021 FINAL  Final  Blood culture (routine x 2)     Status: None   Collection Time: 01/04/21  3:53 PM   Specimen: BLOOD  Result Value Ref Range Status   Specimen Description BLOOD LEFT ANTECUBITAL  Final   Special Requests   Final    BOTTLES DRAWN AEROBIC AND ANAEROBIC Blood Culture adequate volume   Culture   Final    NO GROWTH 5 DAYS Performed at [facility], [address], [place], [place] [address]    Report Status 01/09/2021 FINAL  Final  Body fluid culture     Status: None   Collection Time: 01/05/21  3:55 PM   Specimen: PATH Cytology Pleural fluid  Result Value Ref Range Status   Specimen Description   Final    PLEURAL Performed at [facility], [address] [place], [place] [address]    Special Requests   Final    PLEURAL Performed at [facility], [address] [place], [place] [address]    Gram Stain   Final    ABUNDANT WBC PRESENT, PREDOMINANTLY PMN NO ORGANISMS SEEN    Culture   Final    MODERATE STREPTOCOCCUS INTERMEDIUS CRITICAL RESULT CALLED TO, READ BACK BY AND VERIFIED WITH: DR Delaine Lame @1118  01/07/21 EB Performed at [facility], [place] [address] [place], [place] [address]    Report Status 01/08/2021 FINAL  Final   Organism ID, Bacteria STREPTOCOCCUS INTERMEDIUS  Final       Susceptibility   Streptococcus intermedius - MIC*    PENICILLIN <=0.06 SENSITIVE Sensitive     CEFTRIAXONE 0.25 SENSITIVE Sensitive     ERYTHROMYCIN >=8 RESISTANT Resistant     LEVOFLOXACIN 0.5 SENSITIVE Sensitive     VANCOMYCIN 0.25 SENSITIVE Sensitive     * MODERATE STREPTOCOCCUS INTERMEDIUS  Fungus Culture With Stain     Status: None (Preliminary result)   Collection Time: 01/05/21  3:55 PM   Specimen: PATH Cytology Pleural fluid  Result Value Ref Range Status   Fungus Stain Final report  Final    Comment: (NOTE) Performed At: [facility] [place], [place] [id number] Rush Farmer MD [id number]    Fungus (Mycology) Culture PENDING  Incomplete   Fungal Source PLEURAL  Final    Comment: Performed at [facility], [address] [place], [place] [address]  Acid Fast Smear (AFB)     Status: None   Collection Time: 01/05/21  3:55 PM   Specimen: PATH Cytology Pleural fluid  Result Value Ref Range Status   AFB Specimen Processing Concentration  Final   Acid Fast Smear Negative  Final    Comment: (NOTE) Performed At: [facility] [place], [place] [id number] Rush Farmer MD [id number]    Source (AFB) PLEURAL  Final    Comment: Performed at [facility], [address], [place], [place] [address]  Fungus Culture Result     Status: None   Collection Time: 01/05/21  3:55  PM  Result Value Ref Range Status   Result 1 Comment  Final    Comment: (NOTE) KOH/Calcofluor preparation:  no fungus observed. Performed At: [facility] [place], [place] [id number] Rush Farmer MD [id number]       Radiology Studies: CT CHEST WO CONTRAST  Result Date: 01/10/2021 CLINICAL DATA:  Empyema. COVID-19 infection. Recent catheter placed in right-sided hydropneumothorax. Alcohol abuse. EXAM: CT CHEST WITHOUT CONTRAST TECHNIQUE: Multidetector CT imaging of the chest was performed following the  standard protocol without IV contrast. COMPARISON:  Plain film of earlier today.  Chest CT 01/04/2021. FINDINGS: Cardiovascular: Normal aortic caliber. Normal heart size, without pericardial effusion. Mediastinum/Nodes: No mediastinal or definite hilar adenopathy, given limitations of unenhanced CT. Lungs/Pleura: Small right-sided pneumothorax, with the visceral pleural line maximally 1.5 cm chest wall on 63/6. Pigtail catheter within the pleural space. Minimal pleural fluid inferiorly and dependently. Multi septated right pleural material likely relates to the clinical history of empyema, including on 64/6. Significantly improved right-sided aeration since the prior CT. Right middle lobe collapse/consolidation remains. Mild, left greater than right upper lobe and peripheral predominant ground-glass opacities. Example posterior left upper lobe on 42/6 and posterolateral right upper lobe on 51/6. Upper Abdomen: Moderate hepatic steatosis. Lateral segment left liver lobe prominence. Normal imaged portions of the spleen, stomach, pancreas, adrenal glands, kidneys. Musculoskeletal: No acute osseous abnormality. Mild to moderate right hemidiaphragm elevation. IMPRESSION: 1. Small complex right-sided hydropneumothorax with pigtail catheter within. Improved right-sided aeration with right middle lobe collapse/consolidation remaining. Primarily felt to represent atelectasis. Pneumonia cannot be excluded. 2. Mild upper lobe and peripheral predominant ground-glass opacities which given the clinical history are favored to represent COVID-19 pneumonia. 3. Hepatic steatosis. Suspicion of mild cirrhosis, especially given the clinical history of alcohol abuse. Electronically Signed   By: Abigail Miyamoto M.D.   On: 01/10/2021 18:56   DG CHEST PORT 1 VIEW  Result Date: 01/10/2021 CLINICAL DATA:  History of pleural effusion.  COVID-19. EXAM: PORTABLE CHEST 1 VIEW COMPARISON:  Chest radiograph 01/09/2021. FINDINGS: Stable cardiac and  mediastinal contours. Right chest tube remains in place. Redemonstrated consolidation within the right lower hemithorax. Mild interval increase in size of small right apical and lateral pneumothorax. Left lung is clear. IMPRESSION: Mild interval increase in size of small right apical and lateral pneumothorax. Right chest tube remains in place. These results will be called to the ordering clinician or representative by the Radiologist Assistant, and communication documented in the PACS or [employer]. Electronically Signed   By: Lovey Newcomer M.D.   On: 01/10/2021 08:38       LOS: 2 days   [facility] Pager on [email or web address]  01/11/2021, 11:23 AM

## 2021-01-11 NOTE — Progress Notes (Signed)
      [address]       [place] [address]             [phone]     Intrapleural thrombolytics placed per routine protocol (alteplase and dornase)  Patient tolerated well     John Giovanni, PA-C

## 2021-01-11 NOTE — Progress Notes (Addendum)
      [address]       [place] [address]             [phone]         Subjective: She is having some diarrhea this morning. She is also having some pain at her chest tube site.   Objective: Vital signs in last 24 hours: Temp:  [98.2 F (36.8 C)-98.6 F (37 C)] 98.4 F (36.9 C) (01/23 2000) Pulse Rate:  [70-73] 71 (01/23 1953) Cardiac Rhythm: Normal sinus rhythm (01/23 1950) Resp:  [13-19] 19 (01/23 1953) BP: (92-106)/(55-56) 106/55 (01/23 1953) SpO2:  [97 %-100 %] 97 % (01/23 1953)     Intake/Output from previous day: 01/23 0701 - 01/24 0700 In: 400 [IV Piggyback:400] Out: 80 [Chest Tube:80] Intake/Output this shift: No intake/output data recorded.  General appearance: alert, cooperative and no distress Heart: regular rate and rhythm, S1, S2 normal, no murmur, click, rub or gallop Lungs: clear to auscultation bilaterally and diminished in the right lower lobes Abdomen: soft, non-tender; bowel sounds normal; no masses,  no organomegaly Extremities: extremities normal, atraumatic, no cyanosis or edema Wound: clean and dry around the chest tube  Lab Results: Recent Labs    01/09/21 0707 01/10/21 0127  WBC 7.5 8.4  HGB 7.5* 8.2*  HCT 21.3* 23.8*  PLT 167 189   BMET:  Recent Labs    01/09/21 0707 01/10/21 0127  NA 130* 129*  K 3.9 3.6  CL 96* 96*  CO2 25 22  GLUCOSE 133* 141*  BUN 10 11  CREATININE 0.55 0.68  CALCIUM 7.2* 7.3*    PT/INR: No results for input(s): LABPROT, INR in the last 72 hours. ABG    Component Value Date/Time   HCO3 20.8 07/08/2014 2107   TCO2 24 06/20/2015 0150   ACIDBASEDEF 1.4 07/08/2014 2107   O2SAT 85.9 07/08/2014 2107   CBG (last 3)  No results for input(s): GLUCAP in the last 72 hours.  Assessment/Plan:  1. Empyema-a CT guided chest tube was placed by IR on 1/21. Continue unasyn for 3 to 4 week per ID.  2. COVID 19 + per attending 3. CT scan showed: Small complex right-sided hydropneumothorax  with pigtail catheter within. Improved right-sided aeration with right middle lobe collapse/consolidation remaining. Primarily felt to represent atelectasis. Pneumonia cannot be excluded. Mild upper lobe and peripheral predominant ground-glass opacities which given the clinical history are favored to represent COVID-19 pneumonia.  4. Chest tube continues to drain. 80cc recorded yesterday 5. No recent fevers, WBC 9.3  Plan: Thrombolytics later today. Added imodium for loose stools likely due to the antibiotics.     LOS: 2 days    Elgie Collard 01/11/2021

## 2021-01-11 NOTE — Progress Notes (Signed)
Initial Nutrition Assessment  DOCUMENTATION CODES:   Not applicable  INTERVENTION:  Provide Ensure Enlive po BID, each supplement provides 350 kcal and 20 grams of protein.  Encourage adequate PO intake.   NUTRITION DIAGNOSIS:   Increased nutrient needs related to catabolic illness (COVID) as evidenced by estimated needs.  GOAL:   Patient will meet greater than or equal to 90% of their needs  MONITOR:   PO intake,Supplement acceptance,Skin,Weight trends,Labs,I & O's  REASON FOR ASSESSMENT:   Consult Assessment of nutrition requirement/status  ASSESSMENT:   45 y.o. female with medical history significant of alcohol abuse, tobacco use, history of cervical cancer, atopic dermatitis presents with weakness. Pt with  sepsis and acute hypoxic respiratory failure secondary to COVID-19 infection with superimposed bacterial infection. Pt with right moderate pleural effusion Pleural fluid culture grew Streptococcus intermedius.  Patient underwent ultrasound-guided thoracentesis on 1/18 and thoracostomy under CT guidance by IR on 1/21. Right chest tube draining mucopurulent debris. Pt for aevaluation by CT surgery for more definitive treatment of multiloculated effusion.   Pt reports po intake at breakfast this morning was poor, however is ready for her dinner meal to arrive. She reports eating well prior to admission. Pt with no significant weight loss per weight records. RD to order nutritional supplements to aid in caloric and protein needs. Pt encouraged to eat her food at meals and to drink her supplements. Unable to complete Nutrition-Focused physical exam at this time.   Labs and medications reviewed.   Diet Order:   Diet Order            Diet Heart Room service appropriate? Yes; Fluid consistency: Thin  Diet effective now                 EDUCATION NEEDS:   Not appropriate for education at this time  Skin:  Skin Assessment: Reviewed RN Assessment  Last BM:   1/23  Height:   Ht Readings from Last 1 Encounters:  01/07/21 5' (1.524 m)    Weight:   Wt Readings from Last 1 Encounters:  01/07/21 58.6 kg   BMI:  There is no height or weight on file to calculate BMI.  Estimated Nutritional Needs:   Kcal:  4825-0037  Protein:  85-95 grams  Fluid:  >/= 1.8 L/day  Corrin Parker, MS, RD, LDN RD pager number/after hours weekend pager number on Amion.

## 2021-01-12 ENCOUNTER — Inpatient Hospital Stay (HOSPITAL_COMMUNITY): Payer: Self-pay

## 2021-01-12 DIAGNOSIS — D62 Acute posthemorrhagic anemia: Secondary | ICD-10-CM

## 2021-01-12 DIAGNOSIS — U071 COVID-19: Secondary | ICD-10-CM | POA: Diagnosis not present

## 2021-01-12 DIAGNOSIS — E876 Hypokalemia: Secondary | ICD-10-CM | POA: Diagnosis not present

## 2021-01-12 DIAGNOSIS — J869 Pyothorax without fistula: Secondary | ICD-10-CM | POA: Diagnosis not present

## 2021-01-12 LAB — COMPREHENSIVE METABOLIC PANEL
ALT: 48 U/L — ABNORMAL HIGH (ref 0–44)
AST: 85 U/L — ABNORMAL HIGH (ref 15–41)
Albumin: 1.9 g/dL — ABNORMAL LOW (ref 3.5–5.0)
Alkaline Phosphatase: 50 U/L (ref 38–126)
Anion gap: 10 (ref 5–15)
BUN: 9 mg/dL (ref 6–20)
CO2: 22 mmol/L (ref 22–32)
Calcium: 7.7 mg/dL — ABNORMAL LOW (ref 8.9–10.3)
Chloride: 95 mmol/L — ABNORMAL LOW (ref 98–111)
Creatinine, Ser: 0.49 mg/dL (ref 0.44–1.00)
GFR, Estimated: >60 mL/min (ref >60)
Glucose, Bld: 104 mg/dL — ABNORMAL HIGH (ref 70–99)
Potassium: 3.2 mmol/L — ABNORMAL LOW (ref 3.5–5.1)
Sodium: 127 mmol/L — ABNORMAL LOW (ref 135–145)
Total Bilirubin: 0.9 mg/dL (ref 0.3–1.2)
Total Protein: 5.1 g/dL — ABNORMAL LOW (ref 6.5–8.1)

## 2021-01-12 LAB — CBC
HCT: 17.5 % — ABNORMAL LOW (ref 36.0–46.0)
Hemoglobin: 6 g/dL — CL (ref 12.0–15.0)
MCH: 33.7 pg (ref 26.0–34.0)
MCHC: 34.3 g/dL (ref 30.0–36.0)
MCV: 98.3 fL (ref 80.0–100.0)
Platelets: 206 10*3/uL (ref 150–400)
RBC: 1.78 MIL/uL — ABNORMAL LOW (ref 3.87–5.11)
RDW: 16 % — ABNORMAL HIGH (ref 11.5–15.5)
WBC: 12.3 10*3/uL — ABNORMAL HIGH (ref 4.0–10.5)
nRBC: 0 % (ref 0.0–0.2)

## 2021-01-12 LAB — HCV RNA QUANT RFLX ULTRA OR GENOTYP
HCV RNA Qnt(log copy/mL): UNDETERMINED log10 IU/mL
HepC Qn: NOT DETECTED IU/mL

## 2021-01-12 LAB — HEMOGLOBIN AND HEMATOCRIT, BLOOD
HCT: 25.3 % — ABNORMAL LOW (ref 36.0–46.0)
Hemoglobin: 8.9 g/dL — ABNORMAL LOW (ref 12.0–15.0)

## 2021-01-12 LAB — PREPARE RBC (CROSSMATCH)

## 2021-01-12 MED ORDER — METHOCARBAMOL 500 MG PO TABS
500.0000 mg | ORAL_TABLET | Freq: Three times a day (TID) | ORAL | Status: DC | PRN
  Administered 2021-01-12 – 2021-01-19 (×14): 500 mg via ORAL
  Filled 2021-01-12 (×14): qty 1

## 2021-01-12 MED ORDER — SODIUM CHLORIDE 0.9% IV SOLUTION: Freq: Once | INTRAVENOUS | Status: AC

## 2021-01-12 MED ORDER — POTASSIUM CHLORIDE CRYS ER 20 MEQ PO TBCR
40.0000 meq | EXTENDED_RELEASE_TABLET | Freq: Once | ORAL | Status: AC
  Administered 2021-01-12: 40 meq via ORAL
  Filled 2021-01-12: qty 2

## 2021-01-12 MED ORDER — PANTOPRAZOLE SODIUM 40 MG PO TBEC
40.0000 mg | DELAYED_RELEASE_TABLET | Freq: Every day | ORAL | Status: DC
  Administered 2021-01-12 – 2021-01-19 (×8): 40 mg via ORAL
  Filled 2021-01-12 (×8): qty 1

## 2021-01-12 MED ORDER — ALUM & MAG HYDROXIDE-SIMETH 200-200-20 MG/5ML PO SUSP: 30.0000 mL | ORAL | Status: DC | PRN

## 2021-01-12 MED ORDER — ALUM & MAG HYDROXIDE-SIMETH 200-200-20 MG/5ML PO SUSP
30.0000 mL | ORAL | Status: DC | PRN
  Administered 2021-01-12: 30 mL via ORAL
  Filled 2021-01-12 (×2): qty 30

## 2021-01-12 MED ORDER — COSYNTROPIN 0.25 MG IJ SOLR
0.2500 mg | Freq: Once | INTRAMUSCULAR | Status: AC
  Administered 2021-01-13: 0.25 mg via INTRAVENOUS
  Filled 2021-01-12 (×3): qty 0.25

## 2021-01-12 MED ORDER — CALCIUM CARBONATE ANTACID 500 MG PO CHEW
1.0000 | CHEWABLE_TABLET | Freq: Three times a day (TID) | ORAL | Status: DC | PRN
  Administered 2021-01-12 – 2021-01-19 (×11): 200 mg via ORAL
  Filled 2021-01-12 (×12): qty 1

## 2021-01-12 MED ORDER — SODIUM CHLORIDE 0.9 % IV SOLN: INTRAVENOUS | Status: AC

## 2021-01-12 NOTE — Progress Notes (Signed)
[facility] PROGRESS NOTE   [name] [id number] DOB: [birth year] DOA: 01/09/2021  PCP: Langston Reusing, NP  Brief History/Interval Summary: 45 y.o. female with medical history significant of alcohol abuse, tobacco use, history of cervical cancer, atopic dermatitis admitted to [facility] on 01/04/2021 for feeling weak for about 2 weeks.  She was treated for sepsis and acute hypoxic respiratory failure secondary to COVID-19 infection with superimposed bacterial infection.  She was treated with steroids and a 5-day course of remdesivir.  Work-up also revealed a right moderate pleural effusion.  Pleural fluid culture grew Streptococcus intermedius.  Patient underwent ultrasound-guided thoracentesis on 1/18 and thoracostomy under CT guidance by IR on 1/21.  Right chest tube draining mucopurulent debris.  ID was consulted and recommended antibiotics for at least 3 to 4 weeks.  She was sent to [facility] for evaluation by CT surgery for more definitive treatment of multiloculated effusion.    Reason for Visit: Empyema  Consultants: Cardiothoracic surgery  Procedures: Right chest tube placement  Antibiotics: Anti-infectives (From admission, onward)   Start     Dose/Rate Route Frequency Ordered Stop   01/09/21 2345  Ampicillin-Sulbactam (UNASYN) 3 g in sodium chloride 0.9 % 100 mL IVPB        3 g 200 mL/hr over 30 Minutes Intravenous Every 6 hours 01/09/21 2247        Subjective/Interval History: Patient continues to have pain in the right chest area with some difficulty breathing.  No other symptoms.  No further episodes of diarrhea.   Assessment/Plan:  Right-sided empyema Pleural fluid cultures grew Streptococcus intermedius. Patient underwent thoracentesis on 1/18 followed by a pigtail catheter placement under CT guidance by interventional radiology on 1/21. Sent over to [facility] for evaluation by cardiothoracic surgery.  Cardiothoracic surgery is following.   Defer management of chest tube to them.  Thrombolytics were administered yesterday with large amount of bleeding noted.  Management per cardiothoracic surgery. Patient was also seen by infectious disease at [facility] who recommended antibiotics for 3 to 4 weeks. Currently on Unasyn. Could be transitioned to Augmentin depending on clinical progress. Continue probiotics  Acute blood loss anemia Patient noted to have chronic anemia.  Acute drop noted this morning from 8.6-6.0.  Likely due to the bleeding from her chest tube that occurred yesterday.  Transfuse 1 unit of PRBC this morning.  Repeat hemoglobin is pending.  No active bleeding noted currently.  Pneumonia due to COVID-19 Completed 5-day course of Remdesivir. She has been weaned off of oxygen. Currently saturating normal on room air. Remains on dexamethasone 6 mg daily. Continue incentive spirometer. Based on x-ray findings from the time of admission and her symptoms it looks like her symptoms are primarily due to empyema.  She has a positive test result from 1/17.  Does not need more than 10 days of isolation.  She can come off isolation on 1/28.  Hyponatremia/hypomagnesemia/hypokalemia Sodium was 114 on 1/17.  Has improved with IV hydration.  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  Although her urine osmolality was noted to be only 285.  Sodium level noted to be slightly low today.  Give normal saline infusion.  We will recheck tomorrow. Magnesium was repleted.  Recheck tomorrow.   Replace potassium.  History of alcohol abuse No evidence for withdrawal. Continue thiamine folate multivitamins. CIWA.  History of hypotension/low random cortisol Noted to be on midodrine which is being continued.  Noted to be stable.  It does not appear that the cosyntropin stimulation test was done.  We will reorder.    Abnormal thyroid function tests TSH noted to be 7.9 with a free T4 which is normal at 0.85.  To be rechecked in  the outpatient setting in a few weeks.    Positive hep C antibody/mild transaminitis Patient also with history of alcohol use.  HCV titer and genotype pending.   Ultrasound of the abdomen showed diffusely increased hepatic echogenicity compatible with hepatic steatosis.  Developing cirrhotic changes were also noted.  Cholelithiasis and biliary sludge was also noted.  No cholecystitis was noted. HIV screening was negative.   Will need outpatient consultation with ID as well as gastroenterology.  Moderate protein calorie malnutrition Encourage oral intake.  Tobacco abuse Nicotine patch.   DVT Prophylaxis: Lovenox Code Status: Full code Family Communication: Discussed with the patient. Disposition Plan: Hopefully return home when improved  Status is: Inpatient  Remains inpatient appropriate because:IV treatments appropriate due to intensity of illness or inability to take PO and Inpatient level of care appropriate due to severity of illness   Dispo: The patient is from: Home              Anticipated d/c is to: Home              Anticipated d/c date is: 3 days              Patient currently is not medically stable to d/c.   Difficult to place patient No      Medications:  Scheduled: . dexamethasone (DECADRON) injection  6 mg Intravenous Q24H  . enoxaparin (LOVENOX) injection  40 mg Subcutaneous Q24H  . feeding supplement  237 mL Oral BID BM  . folic acid  1 mg Oral Daily  . midodrine  5 mg Oral BID WC  . multivitamin with minerals  1 tablet Oral Daily  . nicotine  14 mg Transdermal Daily  . pantoprazole  40 mg Oral Q1200  . saccharomyces boulardii  250 mg Oral BID  . thiamine  100 mg Oral Daily   Continuous: . ampicillin-sulbactam (UNASYN) IV Stopped (01/12/21 0454)   [id number] & mag hydroxide-simeth, calcium carbonate, diphenhydrAMINE, ibuprofen, loperamide, methocarbamol   Objective:  Vital Signs  Vitals:   01/11/21 2325 01/12/21 0530 01/12/21 0556 01/12/21  0855  BP: (!) 98/56 (!) 102/46 92/61 92/62   Pulse: 63 (!) 52 (!) 53 61  Resp: 18 16 18 20   Temp: 98.8 F (37.1 C) 98.6 F (37 C) 98.8 F (37.1 C) 98.5 F (36.9 C)  TempSrc: Oral Oral Oral Oral  SpO2: 100% 98% 98% 100%    Intake/Output Summary (Last 24 hours) at 01/12/2021 1101 Last data filed at 01/12/2021 0600 Gross per 24 hour  Intake 918.17 ml  Output 1185 ml  Net -266.83 ml   There were no vitals filed for this visit.  General appearance: Awake alert.  In no distress Resp: Chest tube on the right.  Diminished air entry on the right.  No wheezing or rhonchi. Cardio: S1-S2 is normal regular.  No S3-S4.  No rubs murmurs or bruit GI: Abdomen is soft.  Nontender nondistended.  Bowel sounds are present normal.  No masses organomegaly Extremities: No edema.  Full range of motion of lower extremities. Neurologic: Alert and oriented x3.  No focal neurological deficits.      Lab Results:  Data Reviewed: I have personally reviewed following labs and imaging studies  CBC: Recent Labs  Lab 01/06/21  [id number] 01/07/21 0451 01/08/21 0623 01/09/21 0707 01/10/21 0127 01/11/21 0700 01/12/21 0244  WBC 13.5* 9.1 10.9* 7.5 8.4 9.3 12.3*  NEUTROABS 10.6* 6.1 7.7 5.1  --   --   --   HGB 8.6* 8.0* 7.7* 7.5* 8.2* 8.6* 6.0*  HCT 23.5* 22.0* 20.8* 21.3* 23.8* 25.2* 17.5*  MCV 92.5 94.8 92.4 93.8 97.9 98.1 98.3  PLT 178 163 165 167 189 239 [id number]    Basic Metabolic Panel: Recent Labs  Lab 01/06/21 0526 01/06/21 1011 01/07/21 0410 01/07/21 0451 01/08/21 0623 01/09/21 0707 01/10/21 0127 01/11/21 0700 01/12/21 0244  NA  --  126*   < >  --  129* 130* 129* 129* 127*  K  --  3.4*   < >  --  3.6 3.9 3.6 3.7 3.2*  CL  --  88*   < >  --  90* 96* 96* 97* 95*  CO2  --  28   < >  --  26 25 22 24 22   GLUCOSE  --  204*   < >  --  124* 133* 141* 87 104*  BUN  --  14   < >  --  10 10 11 10 9   CREATININE  --  0.61   < >  --  0.57 0.55 0.68 0.61 0.49  CALCIUM  --  8.0*   < >  --  7.6* 7.2* 7.3*  7.8* 7.7*  MG 1.7  --   --  1.5* 1.8 1.6* 1.4* 1.7  --   PHOS 1.8* 1.5*  --  2.9 3.5  --  3.2  --   --    < > = values in this interval not displayed.    GFR: Estimated Creatinine Clearance: 70.3 mL/min (by C-G formula based on SCr of 0.49 mg/dL).  Liver Function Tests: Recent Labs  Lab 01/06/21 1011 01/11/21 0700 01/12/21 0244  AST  --  111* 85*  ALT  --  51* 48*  ALKPHOS  --  63 50  BILITOT  --  1.0 0.9  PROT  --  6.0* 5.1*  ALBUMIN 2.0* 2.0* 1.9*      CBG: Recent Labs  Lab 01/05/21 1825  GLUCAP 186*       Recent Results (from the past 240 hour(s))  Resp Panel by RT-PCR (Flu A&B, Covid) Nasopharyngeal Swab     Status: Abnormal   Collection Time: 01/04/21  3:40 PM   Specimen: Nasopharyngeal Swab; Nasopharyngeal(NP) swabs in vial transport medium  Result Value Ref Range Status   SARS Coronavirus 2 by RT PCR POSITIVE (A) NEGATIVE Final    Comment: RESULT CALLED TO, READ BACK BY AND VERIFIED WITH: CANDICE KIO 01/04/21 AT [id number] BY ACR (NOTE) SARS-CoV-2 target nucleic acids are DETECTED.  The SARS-CoV-2 RNA is generally detectable in upper respiratory specimens during the acute phase of infection. Positive results are indicative of the presence of the identified virus, but do not rule out bacterial infection or co-infection with other pathogens not detected by the test. Clinical correlation with patient history and other diagnostic information is necessary to determine patient infection status. The expected result is Negative.  Fact Sheet for Patients: EntrepreneurPulse.com.au  Fact Sheet for Healthcare Providers: IncredibleEmployment.be  This test is not yet approved or cleared by the [place] FDA and  has been authorized for detection and/or diagnosis of SARS-CoV-2 by FDA under an Emergency Use Authorization (EUA).  This EUA will remain in effect (meaning this test can b e used) for  the duration of  the COVID-19  declaration under Section 564(b)(1) of the Act, 21 U.S.C. section 360bbb-3(b)(1), unless the authorization is terminated or revoked sooner.     Influenza A by PCR NEGATIVE NEGATIVE Final   Influenza B by PCR NEGATIVE NEGATIVE Final    Comment: (NOTE) The Xpert Xpress SARS-CoV-2/FLU/RSV plus assay is intended as an aid in the diagnosis of influenza from Nasopharyngeal swab specimens and should not be used as a sole basis for treatment. Nasal washings and aspirates are unacceptable for Xpert Xpress SARS-CoV-2/FLU/RSV testing.  Fact Sheet for Patients: EntrepreneurPulse.com.au  Fact Sheet for Healthcare Providers: IncredibleEmployment.be  This test is not yet approved or cleared by the [place] FDA and has been authorized for detection and/or diagnosis of SARS-CoV-2 by FDA under an Emergency Use Authorization (EUA). This EUA will remain in effect (meaning this test can be used) for the duration of the COVID-19 declaration under Section 564(b)(1) of the Act, 21 U.S.C. section 360bbb-3(b)(1), unless the authorization is terminated or revoked.  Performed at [facility], [address] [place], [place] [address]   Blood culture (routine x 2)     Status: None   Collection Time: 01/04/21  3:45 PM   Specimen: BLOOD  Result Value Ref Range Status   Specimen Description BLOOD BLOOD RIGHT HAND  Final   Special Requests   Final    BOTTLES DRAWN AEROBIC AND ANAEROBIC Blood Culture adequate volume   Culture   Final    NO GROWTH 5 DAYS Performed at [facility], [address] [place], [place] [address]    Report Status 01/09/2021 FINAL  Final  Blood culture (routine x 2)     Status: None   Collection Time: 01/04/21  3:53 PM   Specimen: BLOOD  Result Value Ref Range Status   Specimen Description BLOOD LEFT ANTECUBITAL  Final   Special Requests   Final    BOTTLES DRAWN AEROBIC AND ANAEROBIC Blood Culture adequate volume    Culture   Final    NO GROWTH 5 DAYS Performed at [facility], [address], [place], [place] [address]    Report Status 01/09/2021 FINAL  Final  Body fluid culture     Status: None   Collection Time: 01/05/21  3:55 PM   Specimen: PATH Cytology Pleural fluid  Result Value Ref Range Status   Specimen Description   Final    PLEURAL Performed at [facility], [address], [place], [place] [address]    Special Requests   Final    PLEURAL Performed at [facility], [address] [place], [place] [address]    Gram Stain   Final    ABUNDANT WBC PRESENT, PREDOMINANTLY PMN NO ORGANISMS SEEN    Culture   Final    MODERATE STREPTOCOCCUS INTERMEDIUS CRITICAL RESULT CALLED TO, READ BACK BY AND VERIFIED WITH: DR Delaine Lame @1118  01/07/21 EB Performed at [facility], [place] [address], [place], [place] [address]    Report Status 01/08/2021 FINAL  Final   Organism ID, Bacteria STREPTOCOCCUS INTERMEDIUS  Final      Susceptibility   Streptococcus intermedius - MIC*    PENICILLIN <=0.06 SENSITIVE Sensitive     CEFTRIAXONE 0.25 SENSITIVE Sensitive     ERYTHROMYCIN >=8 RESISTANT Resistant     LEVOFLOXACIN 0.5 SENSITIVE Sensitive     VANCOMYCIN 0.25 SENSITIVE Sensitive     * MODERATE STREPTOCOCCUS INTERMEDIUS  Fungus Culture With Stain     Status: None (  Preliminary result)   Collection Time: 01/05/21  3:55 PM   Specimen: PATH Cytology Pleural fluid  Result Value Ref Range Status   Fungus Stain Final report  Final    Comment: (NOTE) Performed At: [facility] [place], [place] [id number] Rush Farmer MD [id number]    Fungus (Mycology) Culture PENDING  Incomplete   Fungal Source PLEURAL  Final    Comment: Performed at [facility], [address] [place], [place] [address]  Acid Fast Smear (AFB)     Status: None   Collection Time: 01/05/21  3:55 PM   Specimen: PATH Cytology Pleural fluid  Result Value Ref  Range Status   AFB Specimen Processing Concentration  Final   Acid Fast Smear Negative  Final    Comment: (NOTE) Performed At: [facility] [place], [place] [id number] Rush Farmer MD [id number]    Source (AFB) PLEURAL  Final    Comment: Performed at [facility], [address] [place], [place] [address]  Fungus Culture Result     Status: None   Collection Time: 01/05/21  3:55 PM  Result Value Ref Range Status   Result 1 Comment  Final    Comment: (NOTE) KOH/Calcofluor preparation:  no fungus observed. Performed At: [facility] [place], [place] [id number] Rush Farmer MD WF:[id number]       Radiology Studies: DG Chest 1 View  Result Date: 01/12/2021 CLINICAL DATA:  Chest tube.  COVID. EXAM: CHEST  1 VIEW COMPARISON:  CT 01/10/2021.  Chest x-ray 01/10/2021. FINDINGS: Right chest tube noted over the right chest base. Interim progression of known right hydropneumothorax. Pneumothorax is approximately 30%. Persistent right base atelectasis/infiltrate. Heart size normal. No acute bony abnormality identified. IMPRESSION: Right chest tube noted over the right chest base. Interim progression of known right hydropneumothorax. Pneumothorax is approximately 30%. Persistent right base atelectasis/infiltrate. These results will be called to the ordering clinician or representative by the Radiologist Assistant, and communication documented in the PACS or [employer]. Electronically Signed   By: Marcello Moores  Register   On: 01/12/2021 05:53   CT CHEST WO CONTRAST  Result Date: 01/10/2021 CLINICAL DATA:  Empyema. COVID-19 infection. Recent catheter placed in right-sided hydropneumothorax. Alcohol abuse. EXAM: CT CHEST WITHOUT CONTRAST TECHNIQUE: Multidetector CT imaging of the chest was performed following the standard protocol without IV contrast. COMPARISON:  Plain film of earlier today.  Chest CT 01/04/2021. FINDINGS: Cardiovascular:  Normal aortic caliber. Normal heart size, without pericardial effusion. Mediastinum/Nodes: No mediastinal or definite hilar adenopathy, given limitations of unenhanced CT. Lungs/Pleura: Small right-sided pneumothorax, with the visceral pleural line maximally 1.5 cm chest wall on 63/6. Pigtail catheter within the pleural space. Minimal pleural fluid inferiorly and dependently. Multi septated right pleural material likely relates to the clinical history of empyema, including on 64/6. Significantly improved right-sided aeration since the prior CT. Right middle lobe collapse/consolidation remains. Mild, left greater than right upper lobe and peripheral predominant ground-glass opacities. Example posterior left upper lobe on 42/6 and posterolateral right upper lobe on 51/6. Upper Abdomen: Moderate hepatic steatosis. Lateral segment left liver lobe prominence. Normal imaged portions of the spleen, stomach, pancreas, adrenal glands, kidneys. Musculoskeletal: No acute osseous abnormality. Mild to moderate right hemidiaphragm elevation. IMPRESSION: 1. Small complex right-sided hydropneumothorax with pigtail catheter within. Improved right-sided aeration with right middle lobe collapse/consolidation remaining. Primarily felt to represent atelectasis. Pneumonia cannot be excluded. 2. Mild upper lobe and peripheral predominant ground-glass opacities which given the clinical history  are favored to represent COVID-19 pneumonia. 3. Hepatic steatosis. Suspicion of mild cirrhosis, especially given the clinical history of alcohol abuse. Electronically Signed   By: Abigail Miyamoto M.D.   On: 01/10/2021 18:56       LOS: 3 days   [facility] Pager on [email or web address]  01/12/2021, 11:01 AM

## 2021-01-12 NOTE — Progress Notes (Signed)
CRITICAL VALUE ALERT  Critical Value:  Hgb 6.0   Date & Time Notied: 01-12-21 0340  Provider Notified: Zierle-Ghosh MD  Orders Received/Actions taken: 1u RBC ordered

## 2021-01-12 NOTE — Progress Notes (Addendum)
      [address]       [place] [address]             [phone]         Subjective: Feels okay this morning. No complaints.   Objective: Vital signs in last 24 hours: Temp:  [98.2 F (36.8 C)-98.8 F (37.1 C)] 98.8 F (37.1 C) (01/25 0556) Pulse Rate:  [52-76] 53 (01/25 0556) Cardiac Rhythm: Normal sinus rhythm (01/24 1900) Resp:  [16-18] 18 (01/25 0556) BP: (91-116)/(46-64) 92/61 (01/25 0556) SpO2:  [97 %-100 %] 98 % (01/25 0556)     Intake/Output from previous day: 01/24 0701 - 01/25 0700 In: 918.2 [I.V.:3.2; Blood:315; IV Piggyback:600] Out: 3419 [Urine:600; Chest Tube:585] Intake/Output this shift: No intake/output data recorded.  General appearance: alert, cooperative and no distress Heart: regular rate and rhythm, S1, S2 normal, no murmur, click, rub or gallop Lungs: clear to auscultation bilaterally Abdomen: soft, non-tender; bowel sounds normal; no masses,  no organomegaly Extremities: extremities normal, atraumatic, no cyanosis or edema Wound: clean and dry around the chest tube   Lab Results: Recent Labs    01/11/21 0700 01/12/21 0244  WBC 9.3 12.3*  HGB 8.6* 6.0*  HCT 25.2* 17.5*  PLT 239 206   BMET:  Recent Labs    01/11/21 0700 01/12/21 0244  NA 129* 127*  K 3.7 3.2*  CL 97* 95*  CO2 24 22  GLUCOSE 87 104*  BUN 10 9  CREATININE 0.61 0.49  CALCIUM 7.8* 7.7*    PT/INR: No results for input(s): LABPROT, INR in the last 72 hours. ABG    Component Value Date/Time   HCO3 20.8 07/08/2014 2107   TCO2 24 06/20/2015 0150   ACIDBASEDEF 1.4 07/08/2014 2107   O2SAT 85.9 07/08/2014 2107   CBG (last 3)  No results for input(s): GLUCAP in the last 72 hours.  Assessment/Plan:  1. Empyema-a CT guided chest tube was placed by IR on 1/21. Continue unasyn for 3 to 4 week per ID.  2. COVID 19 + per attending 3. Thrombolytics done today with 500-600cc of bloody drainage. Remained hemodynamically stable 4. Tolerating room air  with good oxygen saturation  Plan: Will flush tube with sterile saline today and avoid thrombolytics. Increase to 20cm of suction. Order placed for sterile saline flush q 8 hours per nursing .    LOS: 3 days    Elgie Collard 01/12/2021 Patient seen and examined, along with Ms. Harriet Pho, plan as above I suspect she bled with thrombolytics. Tube was clogged but flushed with saline. Will leave on suction. May end up requiring VATS  Remo Lipps C. Roxan Hockey, MD [facility] [phone]

## 2021-01-12 NOTE — Progress Notes (Signed)
Physical Therapy Treatment Patient Details Name: [name] MRN: [id number] DOB: 02-11-1976 Today's Date: 01/12/2021    History of Present Illness 45 y.o. female with medical history significant of alcohol abuse, tobacco use, history of cervical cancer, atopic dermatitis admitted to [facility] on 01/04/2021 for feeling weak for about 2 weeks.  She was treated for sepsis and acute hypoxic respiratory failure secondary to COVID-19 infection with superimposed bacterial infection.  She was treated with steroids and a 5-day course of remdesivir.  Work-up also revealed a right moderate pleural effusion.  Pleural fluid culture grew Streptococcus intermedius.  Patient underwent ultrasound-guided thoracentesis on 1/18 and thoracostomy under CT guidance by IR on 1/21.  Right chest tube draining mucopurulent debris.  ID was consulted and recommended antibiotics for at least 3 to 4 weeks.  She was sent to [facility] for evaluation by CT surgery for more definitive treatment of multiloculated effusion.    PT Comments    Pt supine in bed on arrival.  Pt required encouragement this session to participate in gt training.  Pt continues to benefit from skilled rehab during acute stay to maximize functional gains and improve independence before returning home.     Follow Up Recommendations  No PT follow up;Other (comment)     Equipment Recommendations  Other (comment)    Recommendations for Other Services       Precautions / Restrictions Precautions Precautions: Other (comment) Precaution Comments: Chest tube on the Right; Covid prec Restrictions Weight Bearing Restrictions: No    Mobility  Bed Mobility Overal bed mobility: Independent                Transfers Overall transfer level: Needs assistance Equipment used: Rolling walker (2 wheeled) Transfers: Sit to/from Stand Sit to Stand: Supervision         General transfer comment: cues for chest tube  management  Ambulation/Gait Ambulation/Gait assistance: Supervision Gait Distance (Feet): 48 Feet Assistive device: Rolling walker (2 wheeled) Gait Pattern/deviations: Step-through pattern     General Gait Details: Cues for sequencing with RW and lines and leads.  Pt tolerated gt training well and good balance noted with use of RW.   Stairs             Wheelchair Mobility    Modified Rankin (Stroke Patients Only)       Balance Overall balance assessment: No apparent balance deficits (not formally assessed)                                          Cognition Arousal/Alertness: Awake/alert Behavior During Therapy: WFL for tasks assessed/performed Overall Cognitive Status: Within Functional Limits for tasks assessed                                        Exercises      General Comments General comments (skin integrity, edema, etc.): O2 sats and HR stable during gt training on RA      Pertinent Vitals/Pain Pain Assessment: 0-10 Pain Location: R chest wall at Chest tube insertion Pain Intervention(s): Monitored during session;Repositioned    Home Living                      Prior Function  PT Goals (current goals can now be found in the care plan section) Acute Rehab PT Goals Patient Stated Goal: get better Potential to Achieve Goals: Good Progress towards PT goals: Progressing toward goals    Frequency    Min 3X/week      PT Plan Current plan remains appropriate    Co-evaluation              AM-PAC PT "6 Clicks" Mobility   Outcome Measure  Help needed turning from your back to your side while in a flat bed without using bedrails?: None Help needed moving from lying on your back to sitting on the side of a flat bed without using bedrails?: None Help needed moving to and from a bed to a chair (including a wheelchair)?: A Little Help needed standing up from a chair using your arms (e.g.,  wheelchair or bedside chair)?: A Little Help needed to walk in hospital room?: A Little Help needed climbing 3-5 steps with a railing? : A Little 6 Click Score: 20    End of Session Equipment Utilized During Treatment: Gait belt Activity Tolerance: Patient tolerated treatment well Patient left: in bed;with call bell/phone within reach Nurse Communication: Mobility status PT Visit Diagnosis: Other abnormalities of gait and mobility (R26.89)     Time: 4540-9811 PT Time Calculation (min) (ACUTE ONLY): 21 min  Charges:  $Gait Training: 8-22 mins                     [name] , PTA Acute Rehabilitation Services Pager [phone] Office [phone]     [name] 01/12/2021, 12:49 PM

## 2021-01-13 ENCOUNTER — Inpatient Hospital Stay (HOSPITAL_COMMUNITY): Payer: Self-pay

## 2021-01-13 DIAGNOSIS — Z22322 Carrier or suspected carrier of Methicillin resistant Staphylococcus aureus: Secondary | ICD-10-CM

## 2021-01-13 DIAGNOSIS — J869 Pyothorax without fistula: Secondary | ICD-10-CM | POA: Diagnosis not present

## 2021-01-13 DIAGNOSIS — U071 COVID-19: Secondary | ICD-10-CM | POA: Diagnosis not present

## 2021-01-13 HISTORY — DX: Carrier or suspected carrier of methicillin resistant Staphylococcus aureus: Z22.322

## 2021-01-13 LAB — TYPE AND SCREEN
ABO/RH(D): A POS
Antibody Screen: NEGATIVE
Unit division: 0

## 2021-01-13 LAB — URINALYSIS, ROUTINE W REFLEX MICROSCOPIC
Bilirubin Urine: NEGATIVE
Glucose, UA: NEGATIVE mg/dL
Hgb urine dipstick: NEGATIVE
Ketones, ur: NEGATIVE mg/dL
Leukocytes,Ua: NEGATIVE
Nitrite: NEGATIVE
Protein, ur: NEGATIVE mg/dL
Specific Gravity, Urine: 1.011 (ref 1.005–1.030)
pH: 7 (ref 5.0–8.0)

## 2021-01-13 LAB — BLOOD GAS, ARTERIAL
Acid-base deficit: 3.3 mmol/L — ABNORMAL HIGH (ref 0.0–2.0)
Bicarbonate: 19.3 mmol/L — ABNORMAL LOW (ref 20.0–28.0)
FIO2: 21
O2 Saturation: 87.2 %
Patient temperature: 37
pCO2 arterial: 24.5 mmHg — ABNORMAL LOW (ref 32.0–48.0)
pH, Arterial: 7.509 — ABNORMAL HIGH (ref 7.350–7.450)
pO2, Arterial: 53.4 mmHg — ABNORMAL LOW (ref 83.0–108.0)

## 2021-01-13 LAB — OSMOLALITY, URINE: Osmolality, Ur: 472 mOsm/kg (ref 300–900)

## 2021-01-13 LAB — CBC
HCT: 25.1 % — ABNORMAL LOW (ref 36.0–46.0)
Hemoglobin: 9 g/dL — ABNORMAL LOW (ref 12.0–15.0)
MCH: 35.4 pg — ABNORMAL HIGH (ref 26.0–34.0)
MCHC: 35.9 g/dL (ref 30.0–36.0)
MCV: 98.8 fL (ref 80.0–100.0)
Platelets: 278 10*3/uL (ref 150–400)
RBC: 2.54 MIL/uL — ABNORMAL LOW (ref 3.87–5.11)
RDW: 16.8 % — ABNORMAL HIGH (ref 11.5–15.5)
WBC: 14.5 10*3/uL — ABNORMAL HIGH (ref 4.0–10.5)
nRBC: 0 % (ref 0.0–0.2)

## 2021-01-13 LAB — COMPREHENSIVE METABOLIC PANEL
ALT: 73 U/L — ABNORMAL HIGH (ref 0–44)
AST: 146 U/L — ABNORMAL HIGH (ref 15–41)
Albumin: 2.3 g/dL — ABNORMAL LOW (ref 3.5–5.0)
Alkaline Phosphatase: 58 U/L (ref 38–126)
Anion gap: 9 (ref 5–15)
BUN: 7 mg/dL (ref 6–20)
CO2: 21 mmol/L — ABNORMAL LOW (ref 22–32)
Calcium: 8.2 mg/dL — ABNORMAL LOW (ref 8.9–10.3)
Chloride: 99 mmol/L (ref 98–111)
Creatinine, Ser: 0.57 mg/dL (ref 0.44–1.00)
GFR, Estimated: >60 mL/min (ref >60)
Glucose, Bld: 76 mg/dL (ref 70–99)
Potassium: 4.2 mmol/L (ref 3.5–5.1)
Sodium: 129 mmol/L — ABNORMAL LOW (ref 135–145)
Total Bilirubin: 1.4 mg/dL — ABNORMAL HIGH (ref 0.3–1.2)
Total Protein: 6.1 g/dL — ABNORMAL LOW (ref 6.5–8.1)

## 2021-01-13 LAB — BPAM RBC
Blood Product Expiration Date: 202202162359
ISSUE DATE / TIME: 202201250531
Unit Type and Rh: 6200

## 2021-01-13 LAB — ACTH STIMULATION, 3 TIME POINTS
Cortisol, 30 Min: 6.9 ug/dL
Cortisol, 60 Min: 8.1 ug/dL
Cortisol, Base: 1.1 ug/dL

## 2021-01-13 LAB — APTT: aPTT: 30 seconds (ref 24–36)

## 2021-01-13 LAB — MAGNESIUM: Magnesium: 1.5 mg/dL — ABNORMAL LOW (ref 1.7–2.4)

## 2021-01-13 MED ORDER — VANCOMYCIN HCL IN DEXTROSE 1-5 GM/200ML-% IV SOLN
1000.0000 mg | INTRAVENOUS | Status: AC
  Administered 2021-01-14: 1000 mg via INTRAVENOUS

## 2021-01-13 MED ORDER — OXYCODONE-ACETAMINOPHEN 5-325 MG PO TABS
1.0000 | ORAL_TABLET | Freq: Four times a day (QID) | ORAL | Status: DC | PRN
  Administered 2021-01-13 – 2021-01-14 (×3): 1 via ORAL
  Filled 2021-01-13 (×2): qty 1
  Filled 2021-01-13: qty 2

## 2021-01-13 MED ORDER — GUAIFENESIN-DM 100-10 MG/5ML PO SYRP
5.0000 mL | ORAL_SOLUTION | ORAL | Status: DC | PRN
  Administered 2021-01-13: 5 mL via ORAL
  Filled 2021-01-13: qty 5

## 2021-01-13 MED ORDER — MAGNESIUM SULFATE 4 GM/100ML IV SOLN
4.0000 g | Freq: Once | INTRAVENOUS | Status: AC
  Administered 2021-01-13: 4 g via INTRAVENOUS
  Filled 2021-01-13: qty 100

## 2021-01-13 NOTE — Anesthesia Preprocedure Evaluation (Addendum)
Anesthesia Evaluation  Patient identified by MRN, date of birth, ID band Patient awake    Reviewed: Allergy & Precautions, NPO status , Patient's Chart, lab work & pertinent test results  History of Anesthesia Complications Negative for: history of anesthetic complications  Airway Mallampati: II  TM Distance: >3 FB Neck ROM: Full    Dental no notable dental hx. (+) Dental Advisory Given   Pulmonary Current Smoker,  Strep pneumonia empyema Hx of Covid 19,    Pulmonary exam normal        Cardiovascular negative cardio ROS Normal cardiovascular exam     Neuro/Psych    GI/Hepatic negative GI ROS, (+)     substance abuse  alcohol use,   Endo/Other  negative endocrine ROS  Renal/GU negative Renal ROS     Musculoskeletal   Abdominal   Peds  Hematology  (+) anemia ,   Anesthesia Other Findings   Reproductive/Obstetrics                            Anesthesia Physical Anesthesia Plan  ASA: III  Anesthesia Plan: General   Post-op Pain Management:    Induction: Intravenous  PONV Risk Score and Plan: 3 and Ondansetron, Dexamethasone and Midazolam  Airway Management Planned: Double Lumen EBT  Additional Equipment: Arterial line  Intra-op Plan:   Post-operative Plan: Post-operative intubation/ventilation  Informed Consent: I have reviewed the patients History and Physical, chart, labs and discussed the procedure including the risks, benefits and alternatives for the proposed anesthesia with the patient or authorized representative who has indicated his/her understanding and acceptance.     Dental advisory given  Plan Discussed with: CRNA, Anesthesiologist and Surgeon  Anesthesia Plan Comments:         Anesthesia Quick Evaluation

## 2021-01-13 NOTE — Progress Notes (Signed)
Occupational Therapy Treatment Patient Details Name: [name] MRN: [id number] DOB: 07/08/1976 Today's Date: 01/13/2021    History of present illness 45 y.o. female with medical history significant of alcohol abuse, tobacco use, history of cervical cancer, atopic dermatitis admitted to [facility] on 01/04/2021 for feeling weak for about 2 weeks.  She was treated for sepsis and acute hypoxic respiratory failure secondary to COVID-19 infection with superimposed bacterial infection.  She was treated with steroids and a 5-day course of remdesivir.  Work-up also revealed a right moderate pleural effusion.  Pleural fluid culture grew Streptococcus intermedius.  Patient underwent ultrasound-guided thoracentesis on 1/18 and thoracostomy under CT guidance by IR on 1/21.  Right chest tube draining mucopurulent debris.  ID was consulted and recommended antibiotics for at least 3 to 4 weeks.  She was sent to [facility] for evaluation by CT surgery for more definitive treatment of multiloculated effusion.   OT comments  Patient with continued progress toward all OT stated goals.  Barriers are chest tube and lines/leads.  Patient demonstrates good safety and balance during ADL tasks and functional mobility.  OT expects near independent level once chest tube is removed.  She does complain of generalized fatigue, but shows no signs of dyspnea.  Acute OT will continue to follow, but home and no OT follow up is recommended.    Follow Up Recommendations  No OT follow up;Supervision - Intermittent    Equipment Recommendations  None recommended by OT    Recommendations for Other Services      Precautions / Restrictions Precautions Precautions: Other (comment) Precaution Comments: Chest tube on the Right; Covid prec Restrictions Weight Bearing Restrictions: No       Mobility Bed Mobility Overal bed mobility: Independent                Transfers Overall transfer level: Modified  independent Equipment used: Rolling walker (2 wheeled)             General transfer comment: patient needs setup for lines, leads and chest tube.    Balance Overall balance assessment: No apparent balance deficits (not formally assessed)                                         ADL either performed or assessed with clinical judgement   ADL       Grooming: Wash/dry hands;Wash/dry face;Standing;Set up               Lower Body Dressing: Set up;Sit to/from stand   Toilet Transfer: Set up;Ambulation;RW   Toileting- Clothing Manipulation and Hygiene: Independent;Sitting/lateral lean       Functional mobility during ADLs: Set up;Rolling walker General ADL Comments: setup to handle chest tube, IV, and lines.  Patient demos good balance and safety.     Vision       Perception     Praxis      Cognition Arousal/Alertness: Awake/alert Behavior During Therapy: WFL for tasks assessed/performed Overall Cognitive Status: Within Functional Limits for tasks assessed                                                      General Comments  VSS on RA    Pertinent Vitals/  Pain       Pain Assessment: Faces Faces Pain Scale: Hurts little more Pain Location: R chest wall at Chest tube insertion Pain Descriptors / Indicators: Tender Pain Intervention(s): Monitored during session                                                          Frequency  Min 2X/week        Progress Toward Goals  OT Goals(current goals can now be found in the care plan section)  Progress towards OT goals: Progressing toward goals  Acute Rehab OT Goals Patient Stated Goal: hopefully I will get rid of the chest tube tomorrow OT Goal Formulation: With patient Potential to Achieve Goals: Good  Plan Discharge plan remains appropriate    Co-evaluation                 AM-PAC OT "6 Clicks" Daily Activity     Outcome  Measure   Help from another person eating meals?: None Help from another person taking care of personal grooming?: None Help from another person toileting, which includes using toliet, bedpan, or urinal?: A Little Help from another person bathing (including washing, rinsing, drying)?: A Little Help from another person to put on and taking off regular upper body clothing?: None Help from another person to put on and taking off regular lower body clothing?: A Little 6 Click Score: 21    End of Session Equipment Utilized During Treatment: Rolling walker  OT Visit Diagnosis: Unsteadiness on feet (R26.81);Muscle weakness (generalized) (M62.81)   Activity Tolerance Patient tolerated treatment well   Patient Left in bed;with call bell/phone within reach   Nurse Communication Mobility status        Time: 7494-4967 OT Time Calculation (min): 19 min  Charges: OT General Charges $OT Visit: 1 Visit OT Treatments $Self Care/Home Management : 8-22 mins  01/13/2021  Rich, OTR/L  Acute Rehabilitation Services  Office:  [phone]    Metta Clines 01/13/2021, 3:10 PM

## 2021-01-13 NOTE — Progress Notes (Signed)
Patient complaining of cough.  Patient has standing orders for Robitussin DM for cough  RN entered order

## 2021-01-13 NOTE — Progress Notes (Signed)
[facility] PROGRESS NOTE   [name] [id number] DOB: [birth year] DOA: 01/09/2021  PCP: [name] Reusing, NP  Brief History/Interval Summary: 45 y.o. female with medical history significant of alcohol abuse, tobacco use, history of cervical cancer, atopic dermatitis admitted to [facility] on 01/04/2021 for feeling weak for about 2 weeks.  She was treated for sepsis and acute hypoxic respiratory failure secondary to COVID-19 infection with superimposed bacterial infection.  She was treated with steroids and a 5-day course of remdesivir.  Work-up also revealed a right moderate pleural effusion.  Pleural fluid culture grew Streptococcus intermedius.  Patient underwent ultrasound-guided thoracentesis on 1/18 and thoracostomy under CT guidance by IR on 1/21.  Right chest tube draining mucopurulent debris.  ID was consulted and recommended antibiotics for at least 3 to 4 weeks.  She was sent to [facility] for evaluation by CT surgery for more definitive treatment of multiloculated effusion.    Reason for Visit: Empyema  Consultants: Cardiothoracic surgery  Procedures: Right chest tube placement  Antibiotics: Anti-infectives (From admission, onward)   Start     Dose/Rate Route Frequency Ordered Stop   01/09/21 2345  Ampicillin-Sulbactam (UNASYN) 3 g in sodium chloride 0.9 % 100 mL IVPB        3 g 200 mL/hr over 30 Minutes Intravenous Every 6 hours 01/09/21 2247        Subjective/Interval History: Patient continues to have pain in the right chest area with some difficulty breathing.  No other symptoms.  No further episodes of diarrhea.   Assessment/Plan:  Right-sided empyema s/p pigtail catheter 1/21 Pleural fluid cultures grew Streptococcus intermedius. Patient underwent thoracentesis on 1/18 followed by a pigtail catheter placement under CT guidance by interventional radiology on 1/21. Cardiothoracic surgery is following.  Defer management of chest tube to them.   Thrombolytics were administered previously with large amount of bleeding noted - anemia stable after transfusion Patient was also seen by infectious disease recommending antibiotics for 3 to 4 weeks. Currently on Unasyn likely transition to Augmentin depending on clinical progress. Continue probiotics Increase analgesia given ongoing pleuritic chest pain - add percocet 5/325 prn  Acute blood loss anemia, stable Patient noted to have chronic anemia.   Previous drop to 6.0 in the setting of above - s/p 1u PRBC - now stable No further bleeding noted  Pneumonia due to COVID-19 Completed 5-day course of Remdesivir. She has been weaned off of oxygen. Currently saturating normal on room air. Remains on dexamethasone 6 mg daily. Continue incentive spirometer. Based on x-ray findings from the time of admission and her symptoms it looks like her symptoms are primarily due to empyema.  She has a positive test result from 1/17.  Does not need more than 10 days of isolation.  She can come off isolation on 1/28.  Hyponatremia/hypomagnesemia/hypokalemia, improving Likely hypovolemic vs SIADH from lung infection Sodium was 114 on 1/17.  Labs stabilizing now - likely at baseline  History of alcohol abuse No evidence for withdrawal. Continue thiamine folate multivitamins. CIWA.  History of hypotension/low random cortisol Noted to be on midodrine which is being continued.  Noted to be stable.   It does not appear that the cosyntropin stimulation test was done.  We will reorder.    Abnormal thyroid function tests TSH noted to be 7.9 with a free T4 which is normal at 0.85.  To be rechecked in the outpatient setting in a few weeks.    Positive hep C antibody/mild transaminitis Patient also with  history of alcohol use.  HCV titer and genotype pending.   Ultrasound of the abdomen showed diffusely increased hepatic echogenicity compatible with hepatic steatosis.  Developing cirrhotic changes were also noted.   Cholelithiasis and biliary sludge was also noted.  No cholecystitis was noted. HIV screening was negative.   Will need outpatient consultation with ID as well as gastroenterology.  Moderate protein calorie malnutrition Encourage oral intake.  Tobacco abuse Nicotine patch.   DVT Prophylaxis: Lovenox Code Status: Full code Family Communication: Discussed with the patient. Disposition Plan: Hopefully return home when improved  Status is: Inpatient  Remains inpatient appropriate because:IV treatments appropriate due to intensity of illness or inability to take PO and Inpatient level of care appropriate due to severity of illness   Dispo: The patient is from: Home              Anticipated d/c is to: Home              Anticipated d/c date is: 3 days              Patient currently is not medically stable to d/c.   Difficult to place patient No      Medications:  Scheduled: . cosyntropin  0.25 mg Intravenous Once  . dexamethasone (DECADRON) injection  6 mg Intravenous Q24H  . enoxaparin (LOVENOX) injection  40 mg Subcutaneous Q24H  . feeding supplement  237 mL Oral BID BM  . folic acid  1 mg Oral Daily  . midodrine  5 mg Oral BID WC  . multivitamin with minerals  1 tablet Oral Daily  . nicotine  14 mg Transdermal Daily  . pantoprazole  40 mg Oral Q1200  . saccharomyces boulardii  250 mg Oral BID  . thiamine  100 mg Oral Daily   Continuous: . sodium chloride 50 mL/hr at 01/12/21 1306  . ampicillin-sulbactam (UNASYN) IV 3 g (01/13/21 0631)   GYI:RSWN & mag hydroxide-simeth, calcium carbonate, diphenhydrAMINE, guaiFENesin-dextromethorphan, ibuprofen, loperamide, methocarbamol   Objective:  Vital Signs  Vitals:   01/12/21 1208 01/12/21 1657 01/12/21 2009 01/13/21 0515  BP: 108/80 122/84 121/62 (!) 102/57  Pulse: 63 65 60 63  Resp: 14 13 14 20   Temp: 98.4 F (36.9 C) (!) 97.3 F (36.3 C) 98.7 F (37.1 C) 98.5 F (36.9 C)  TempSrc: Oral Oral Oral Oral  SpO2: 97%  98% 98% 98%   No intake or output data in the 24 hours ending 01/13/21 0806 There were no vitals filed for this visit.  General appearance: Awake alert.  In no distress Resp: Chest tube on the right.  Diminished air entry on the right.  No wheezing or rhonchi. Cardio: S1-S2 is normal regular.  No S3-S4.  No rubs murmurs or bruit GI: Abdomen is soft.  Nontender nondistended.  Bowel sounds are present normal.  No masses organomegaly Extremities: No edema.  Full range of motion of lower extremities. Neurologic: Alert and oriented x3.  No focal neurological deficits.      Lab Results:  Data Reviewed: I have personally reviewed following labs and imaging studies  CBC: Recent Labs  Lab 01/07/21 0451 01/08/21 0623 01/09/21 0707 01/10/21 0127 01/11/21 0700 01/12/21 0244 01/12/21 1004  WBC 9.1 10.9* 7.5 8.4 9.3 12.3*  --   NEUTROABS 6.1 7.7 5.1  --   --   --   --   HGB 8.0* 7.7* 7.5* 8.2* 8.6* 6.0* 8.9*  HCT 22.0* 20.8* 21.3* 23.8* 25.2* 17.5* 25.3*  MCV 94.8 92.4  93.8 97.9 98.1 98.3  --   PLT 163 165 167 189 239 206  --     Basic Metabolic Panel: Recent Labs  Lab 01/06/21 1011 01/07/21 0410 01/07/21 0451 01/08/21 0623 01/09/21 0707 01/10/21 0127 01/11/21 0700 01/12/21 0244  NA 126*   < >  --  129* 130* 129* 129* 127*  K 3.4*   < >  --  3.6 3.9 3.6 3.7 3.2*  CL 88*   < >  --  90* 96* 96* 97* 95*  CO2 28   < >  --  26 25 22 24 22   GLUCOSE 204*   < >  --  124* 133* 141* 87 104*  BUN 14   < >  --  10 10 11 10 9   CREATININE 0.61   < >  --  0.57 0.55 0.68 0.61 0.49  CALCIUM 8.0*   < >  --  7.6* 7.2* 7.3* 7.8* 7.7*  MG  --   --  1.5* 1.8 1.6* 1.4* 1.7  --   PHOS 1.5*  --  2.9 3.5  --  3.2  --   --    < > = values in this interval not displayed.    GFR: Estimated Creatinine Clearance: 70.3 mL/min (by C-G formula based on SCr of 0.49 mg/dL).  Liver Function Tests: Recent Labs  Lab 01/06/21 1011 01/11/21 0700 01/12/21 0244  AST  --  111* 85*  ALT  --  51* 48*   ALKPHOS  --  63 50  BILITOT  --  1.0 0.9  PROT  --  6.0* 5.1*  ALBUMIN 2.0* 2.0* 1.9*      CBG: No results for input(s): GLUCAP in the last 168 hours.     Recent Results (from the past 240 hour(s))  Resp Panel by RT-PCR (Flu A&B, Covid) Nasopharyngeal Swab     Status: Abnormal   Collection Time: 01/04/21  3:40 PM   Specimen: Nasopharyngeal Swab; Nasopharyngeal(NP) swabs in vial transport medium  Result Value Ref Range Status   SARS Coronavirus 2 by RT PCR POSITIVE (A) NEGATIVE Final    Comment: RESULT CALLED TO, READ BACK BY AND VERIFIED WITH: CANDICE KIO 01/04/21 AT [id number] BY ACR (NOTE) SARS-CoV-2 target nucleic acids are DETECTED.  The SARS-CoV-2 RNA is generally detectable in upper respiratory specimens during the acute phase of infection. Positive results are indicative of the presence of the identified virus, but do not rule out bacterial infection or co-infection with other pathogens not detected by the test. Clinical correlation with patient history and other diagnostic information is necessary to determine patient infection status. The expected result is Negative.  Fact Sheet for Patients: EntrepreneurPulse.com.au  Fact Sheet for Healthcare Providers: IncredibleEmployment.be  This test is not yet approved or cleared by the [place] FDA and  has been authorized for detection and/or diagnosis of SARS-CoV-2 by FDA under an Emergency Use Authorization (EUA).  This EUA will remain in effect (meaning this test can b e used) for the duration of  the COVID-19 declaration under Section 564(b)(1) of the Act, 21 U.S.C. section 360bbb-3(b)(1), unless the authorization is terminated or revoked sooner.     Influenza A by PCR NEGATIVE NEGATIVE Final   Influenza B by PCR NEGATIVE NEGATIVE Final    Comment: (NOTE) The Xpert Xpress SARS-CoV-2/FLU/RSV plus assay is intended as an aid in the diagnosis of influenza from Nasopharyngeal swab  specimens and should not be used as a sole basis for treatment. Nasal washings  and aspirates are unacceptable for Xpert Xpress SARS-CoV-2/FLU/RSV testing.  Fact Sheet for Patients: EntrepreneurPulse.com.au  Fact Sheet for Healthcare Providers: IncredibleEmployment.be  This test is not yet approved or cleared by the [place] FDA and has been authorized for detection and/or diagnosis of SARS-CoV-2 by FDA under an Emergency Use Authorization (EUA). This EUA will remain in effect (meaning this test can be used) for the duration of the COVID-19 declaration under Section 564(b)(1) of the Act, 21 U.S.C. section 360bbb-3(b)(1), unless the authorization is terminated or revoked.  Performed at [facility], [address] [place], [place] [address]   Blood culture (routine x 2)     Status: None   Collection Time: 01/04/21  3:45 PM   Specimen: BLOOD  Result Value Ref Range Status   Specimen Description BLOOD BLOOD RIGHT HAND  Final   Special Requests   Final    BOTTLES DRAWN AEROBIC AND ANAEROBIC Blood Culture adequate volume   Culture   Final    NO GROWTH 5 DAYS Performed at [facility], [address], [place], [place] [address]    Report Status 01/09/2021 FINAL  Final  Blood culture (routine x 2)     Status: None   Collection Time: 01/04/21  3:53 PM   Specimen: BLOOD  Result Value Ref Range Status   Specimen Description BLOOD LEFT ANTECUBITAL  Final   Special Requests   Final    BOTTLES DRAWN AEROBIC AND ANAEROBIC Blood Culture adequate volume   Culture   Final    NO GROWTH 5 DAYS Performed at [facility], [address], [place], [place] [address]    Report Status 01/09/2021 FINAL  Final  Body fluid culture     Status: None   Collection Time: 01/05/21  3:55 PM   Specimen: PATH Cytology Pleural fluid  Result Value Ref Range Status   Specimen Description   Final    PLEURAL Performed at [facility], [address] [place], [place] [address]    Special Requests   Final    PLEURAL Performed at [facility], [address] [place], [place] [address]    Gram Stain   Final    ABUNDANT WBC PRESENT, PREDOMINANTLY PMN NO ORGANISMS SEEN    Culture   Final    MODERATE STREPTOCOCCUS INTERMEDIUS CRITICAL RESULT CALLED TO, READ BACK BY AND VERIFIED WITH: DR Delaine Lame @1118  01/07/21 EB Performed at [facility], [place] [address], [place], [place] [address]    Report Status 01/08/2021 FINAL  Final   Organism ID, Bacteria STREPTOCOCCUS INTERMEDIUS  Final      Susceptibility   Streptococcus intermedius - MIC*    PENICILLIN <=0.06 SENSITIVE Sensitive     CEFTRIAXONE 0.25 SENSITIVE Sensitive     ERYTHROMYCIN >=8 RESISTANT Resistant     LEVOFLOXACIN 0.5 SENSITIVE Sensitive     VANCOMYCIN 0.25 SENSITIVE Sensitive     * MODERATE STREPTOCOCCUS INTERMEDIUS  Fungus Culture With Stain     Status: None (Preliminary result)   Collection Time: 01/05/21  3:55 PM   Specimen: PATH Cytology Pleural fluid  Result Value Ref Range Status   Fungus Stain Final report  Final    Comment: (NOTE) Performed At: [facility] [place], [place] [id number] Rush Farmer MD [id number]    Fungus (Mycology) Culture PENDING  Incomplete   Fungal Source PLEURAL  Final    Comment: Performed at [facility], [address], [place], [place] [address]  Acid Fast Smear (AFB)     Status: None   Collection Time: 01/05/21  3:55 PM   Specimen: PATH Cytology Pleural fluid  Result Value Ref Range Status   AFB Specimen Processing Concentration  Final   Acid Fast Smear Negative  Final    Comment: (NOTE) Performed At: [facility] [place], [place] [id number] Rush Farmer MD [id number]    Source (AFB) PLEURAL  Final    Comment: Performed at [facility], [address] [place], [place] [address]  Fungus Culture Result      Status: None   Collection Time: 01/05/21  3:55 PM  Result Value Ref Range Status   Result 1 Comment  Final    Comment: (NOTE) KOH/Calcofluor preparation:  no fungus observed. Performed At: [facility] [place], [place] [id number] Rush Farmer MD [id number]       Radiology Studies: DG Chest 1 View  Result Date: 01/12/2021 CLINICAL DATA:  Chest tube.  COVID. EXAM: CHEST  1 VIEW COMPARISON:  CT 01/10/2021.  Chest x-ray 01/10/2021. FINDINGS: Right chest tube noted over the right chest base. Interim progression of known right hydropneumothorax. Pneumothorax is approximately 30%. Persistent right base atelectasis/infiltrate. Heart size normal. No acute bony abnormality identified. IMPRESSION: Right chest tube noted over the right chest base. Interim progression of known right hydropneumothorax. Pneumothorax is approximately 30%. Persistent right base atelectasis/infiltrate. These results will be called to the ordering clinician or representative by the Radiologist Assistant, and communication documented in the PACS or [employer]. Electronically Signed   By: Marcello Moores  Register   On: 01/12/2021 05:53   DG Chest Port 1 View  Result Date: 01/13/2021 CLINICAL DATA:  Empyema.  Chest tube. EXAM: PORTABLE CHEST 1 VIEW COMPARISON:  01/12/2021. FINDINGS: Right chest tube in stable position. Right hydropneumothorax again noted. Slight interim improvement from prior exam. Persistent right base atelectasis/infiltrate. Heart size stable. IMPRESSION: Right chest tube in stable position. Right hydropneumothorax again noted. Slight interim improvement from prior exam. Persistent right base atelectasis/infiltrate. Electronically Signed   By: Marcello Moores  Register   On: 01/13/2021 07:57       LOS: 4 days   [facility] Pager on [email or web address]  01/13/2021, 8:06 AM

## 2021-01-13 NOTE — Progress Notes (Addendum)
      [address]       [place] [address]             [phone]      Subjective:  Patient states she is coughing a lot more since thrombolytics have been placed.  Continues to have pain at chest tube site  Objective: Vital signs in last 24 hours: Temp:  [97.3 F (36.3 C)-98.7 F (37.1 C)] 98.5 F (36.9 C) (01/26 0515) Pulse Rate:  [60-65] 63 (01/26 0515) Cardiac Rhythm: Sinus bradycardia (01/25 1955) Resp:  [13-20] 20 (01/26 0515) BP: (92-122)/(57-84) 102/57 (01/26 0515) SpO2:  [97 %-100 %] 98 % (01/26 0515)  General appearance: alert, cooperative and no distress Heart: regular rate and rhythm Lungs: diminished breath sounds bibasilar Abdomen: soft, non-tender; bowel sounds normal; no masses,  no organomegaly Extremities: extremities normal, atraumatic, no cyanosis or edema Wound: clean and dry  Lab Results: Recent Labs    01/11/21 0700 01/12/21 0244 01/12/21 1004  WBC 9.3 12.3*  --   HGB 8.6* 6.0* 8.9*  HCT 25.2* 17.5* 25.3*  PLT 239 206  --    BMET:  Recent Labs    01/11/21 0700 01/12/21 0244  NA 129* 127*  K 3.7 3.2*  CL 97* 95*  CO2 24 22  GLUCOSE 87 104*  BUN 10 9  CREATININE 0.61 0.49  CALCIUM 7.8* 7.7*    PT/INR: No results for input(s): LABPROT, INR in the last 72 hours. ABG    Component Value Date/Time   HCO3 20.8 07/08/2014 2107   TCO2 24 06/20/2015 0150   ACIDBASEDEF 1.4 07/08/2014 2107   O2SAT 85.9 07/08/2014 2107   CBG (last 3)  No results for input(s): GLUCAP in the last 72 hours.  Assessment/Plan:  1. COVID-19- care per medicine 2. Pulm- Empyema, thrombolytics instilled 1/24- output has been bloody, CT being flushed with saline every 8 hours, since 10 pm last night she has had 300 cc of output, leave chest tube on suction today, CXR maybe slightly improved, repeat in AM 3. Chest tube site pain- expected while chest tube in place, continue prn pain medicaitons 4. dispo- patient stable, output bloody due to  thrombolytics, output 300 cc since 10 pm yesterday, continue CT to suction today, COVID-19 infection, care per medicine   LOS: 4 days    Erin Barrett, PA-C 01/13/2021 Patient seen and examined, agree with above She is currently on water seal- tube placed back to suction CXR this Am still shows a space, albeit slightly decreased from yesterday.  Will repeat CXR in Am- if there is still a space will plan to proceed with Right VATS for drainage of empyema and decortication.   I discussed the general nature of the procedure, the need for general anesthesia, the incisions to be used, the use of drainage tubes postoperatively with Ms. Hirschi. We discussed the expected hospital stay, overall recovery and short and long term outcomes. She understands the risks include, but are not limited to death, stroke, MI, DVT/PE, bleeding, possible need for transfusion, infections, as well as other organ system dysfunction including respiratory, renal, or GI complications.   She accepts the risks and agrees to proceed.  Will see in AM after CXR and make final determination.  Essance Gatti C. Amora Sheehy, MD [facility] [phone]  

## 2021-01-13 NOTE — H&P (View-Only) (Signed)
      [address]       [place] [address]             [phone]      Subjective:  Patient states she is coughing a lot more since thrombolytics have been placed.  Continues to have pain at chest tube site  Objective: Vital signs in last 24 hours: Temp:  [97.3 F (36.3 C)-98.7 F (37.1 C)] 98.5 F (36.9 C) (01/26 0515) Pulse Rate:  [60-65] 63 (01/26 0515) Cardiac Rhythm: Sinus bradycardia (01/25 1955) Resp:  [13-20] 20 (01/26 0515) BP: (92-122)/(57-84) 102/57 (01/26 0515) SpO2:  [97 %-100 %] 98 % (01/26 0515)  General appearance: alert, cooperative and no distress Heart: regular rate and rhythm Lungs: diminished breath sounds bibasilar Abdomen: soft, non-tender; bowel sounds normal; no masses,  no organomegaly Extremities: extremities normal, atraumatic, no cyanosis or edema Wound: clean and dry  Lab Results: Recent Labs    01/11/21 0700 01/12/21 0244 01/12/21 1004  WBC 9.3 12.3*  --   HGB 8.6* 6.0* 8.9*  HCT 25.2* 17.5* 25.3*  PLT 239 206  --    BMET:  Recent Labs    01/11/21 0700 01/12/21 0244  NA 129* 127*  K 3.7 3.2*  CL 97* 95*  CO2 24 22  GLUCOSE 87 104*  BUN 10 9  CREATININE 0.61 0.49  CALCIUM 7.8* 7.7*    PT/INR: No results for input(s): LABPROT, INR in the last 72 hours. ABG    Component Value Date/Time   HCO3 20.8 07/08/2014 2107   TCO2 24 06/20/2015 0150   ACIDBASEDEF 1.4 07/08/2014 2107   O2SAT 85.9 07/08/2014 2107   CBG (last 3)  No results for input(s): GLUCAP in the last 72 hours.  Assessment/Plan:  1. COVID-19- care per medicine 2. Pulm- Empyema, thrombolytics instilled 1/24- output has been bloody, CT being flushed with saline every 8 hours, since 10 pm last night she has had 300 cc of output, leave chest tube on suction today, CXR maybe slightly improved, repeat in AM 3. Chest tube site pain- expected while chest tube in place, continue prn pain medicaitons 4. dispo- patient stable, output bloody due to  thrombolytics, output 300 cc since 10 pm yesterday, continue CT to suction today, COVID-19 infection, care per medicine   LOS: 4 days    Ellwood Handler, PA-C 01/13/2021 Patient seen and examined, agree with above She is currently on water seal- tube placed back to suction CXR this Am still shows a space, albeit slightly decreased from yesterday.  Will repeat CXR in Am- if there is still a space will plan to proceed with Right VATS for drainage of empyema and decortication.   I discussed the general nature of the procedure, the need for general anesthesia, the incisions to be used, the use of drainage tubes postoperatively with [name]. We discussed the expected hospital stay, overall recovery and short and long term outcomes. She understands the risks include, but are not limited to death, stroke, MI, DVT/PE, bleeding, possible need for transfusion, infections, as well as other organ system dysfunction including respiratory, renal, or GI complications.   She accepts the risks and agrees to proceed.  Will see in AM after CXR and make final determination.  Revonda Standard Roxan Hockey, MD [facility] [phone]

## 2021-01-14 ENCOUNTER — Other Ambulatory Visit: Payer: Self-pay

## 2021-01-14 ENCOUNTER — Inpatient Hospital Stay (HOSPITAL_COMMUNITY): Payer: Self-pay | Admitting: Anesthesiology

## 2021-01-14 ENCOUNTER — Inpatient Hospital Stay (HOSPITAL_COMMUNITY): Payer: Self-pay

## 2021-01-14 ENCOUNTER — Encounter (HOSPITAL_COMMUNITY)
Admission: AD | Disposition: A | Discharge status: Home / Self Care | Payer: Self-pay | Source: Other Acute Inpatient Hospital | Attending: Internal Medicine

## 2021-01-14 ENCOUNTER — Encounter (HOSPITAL_COMMUNITY): Payer: Self-pay | Admitting: Internal Medicine

## 2021-01-14 DIAGNOSIS — J869 Pyothorax without fistula: Secondary | ICD-10-CM | POA: Diagnosis not present

## 2021-01-14 DIAGNOSIS — U071 COVID-19: Secondary | ICD-10-CM | POA: Diagnosis not present

## 2021-01-14 HISTORY — PX: DECORTICATION: SHX5101

## 2021-01-14 HISTORY — PX: VIDEO ASSISTED THORACOSCOPY (VATS)/EMPYEMA: SHX6172

## 2021-01-14 LAB — COMPREHENSIVE METABOLIC PANEL
ALT: 61 U/L — ABNORMAL HIGH (ref 0–44)
AST: 92 U/L — ABNORMAL HIGH (ref 15–41)
Albumin: 2.1 g/dL — ABNORMAL LOW (ref 3.5–5.0)
Alkaline Phosphatase: 50 U/L (ref 38–126)
Anion gap: 8 (ref 5–15)
BUN: 7 mg/dL (ref 6–20)
CO2: 20 mmol/L — ABNORMAL LOW (ref 22–32)
Calcium: 7.8 mg/dL — ABNORMAL LOW (ref 8.9–10.3)
Chloride: 99 mmol/L (ref 98–111)
Creatinine, Ser: 0.51 mg/dL (ref 0.44–1.00)
GFR, Estimated: >60 mL/min (ref >60)
Glucose, Bld: 93 mg/dL (ref 70–99)
Potassium: 3.8 mmol/L (ref 3.5–5.1)
Sodium: 127 mmol/L — ABNORMAL LOW (ref 135–145)
Total Bilirubin: 1 mg/dL (ref 0.3–1.2)
Total Protein: 5.5 g/dL — ABNORMAL LOW (ref 6.5–8.1)

## 2021-01-14 LAB — CBC
HCT: 22.5 % — ABNORMAL LOW (ref 36.0–46.0)
Hemoglobin: 7.9 g/dL — ABNORMAL LOW (ref 12.0–15.0)
MCH: 34.5 pg — ABNORMAL HIGH (ref 26.0–34.0)
MCHC: 35.1 g/dL (ref 30.0–36.0)
MCV: 98.3 fL (ref 80.0–100.0)
Platelets: 257 10*3/uL (ref 150–400)
RBC: 2.29 MIL/uL — ABNORMAL LOW (ref 3.87–5.11)
RDW: 16.7 % — ABNORMAL HIGH (ref 11.5–15.5)
WBC: 14.1 10*3/uL — ABNORMAL HIGH (ref 4.0–10.5)
nRBC: 0 % (ref 0.0–0.2)

## 2021-01-14 LAB — MRSA PCR SCREENING: MRSA by PCR: POSITIVE — AB

## 2021-01-14 LAB — PREPARE RBC (CROSSMATCH)

## 2021-01-14 SURGERY — VIDEO ASSISTED THORACOSCOPY (VATS)/EMPYEMA
Anesthesia: General | Site: Chest | Laterality: Right

## 2021-01-14 MED ORDER — LIDOCAINE 2% (20 MG/ML) 5 ML SYRINGE
INTRAMUSCULAR | Status: DC | PRN
  Administered 2021-01-14: 100 mg via INTRAVENOUS

## 2021-01-14 MED ORDER — PROPOFOL 10 MG/ML IV BOLUS
INTRAVENOUS | Status: DC | PRN
  Administered 2021-01-14: 150 mg via INTRAVENOUS

## 2021-01-14 MED ORDER — HYDROMORPHONE HCL 1 MG/ML IJ SOLN
INTRAMUSCULAR | Status: AC
  Filled 2021-01-14: qty 0.5

## 2021-01-14 MED ORDER — NALOXONE HCL 0.4 MG/ML IJ SOLN: 0.4000 mg | INTRAMUSCULAR | Status: DC | PRN

## 2021-01-14 MED ORDER — TRAMADOL HCL 50 MG PO TABS
50.0000 mg | ORAL_TABLET | Freq: Four times a day (QID) | ORAL | Status: DC | PRN
Start: 2021-01-14 — End: 2021-01-19

## 2021-01-14 MED ORDER — CHLORHEXIDINE GLUCONATE CLOTH 2 % EX PADS
6.0000 | MEDICATED_PAD | Freq: Every day | CUTANEOUS | Status: AC
  Administered 2021-01-14 – 2021-01-18 (×4): 6 via TOPICAL

## 2021-01-14 MED ORDER — KETOROLAC TROMETHAMINE 30 MG/ML IJ SOLN
INTRAMUSCULAR | Status: AC
  Filled 2021-01-14: qty 1

## 2021-01-14 MED ORDER — MIDAZOLAM HCL 5 MG/5ML IJ SOLN
INTRAMUSCULAR | Status: DC | PRN
  Administered 2021-01-14: 2 mg via INTRAVENOUS

## 2021-01-14 MED ORDER — OXYCODONE HCL 5 MG PO TABS
5.0000 mg | ORAL_TABLET | ORAL | Status: DC | PRN
  Administered 2021-01-14 – 2021-01-18 (×17): 10 mg via ORAL
  Administered 2021-01-18: 5 mg via ORAL
  Administered 2021-01-18 – 2021-01-19 (×6): 10 mg via ORAL
  Filled 2021-01-14 (×25): qty 2

## 2021-01-14 MED ORDER — BUPIVACAINE HCL (PF) 0.5 % IJ SOLN
INTRAMUSCULAR | Status: AC
  Filled 2021-01-14: qty 30

## 2021-01-14 MED ORDER — MUPIROCIN 2 % EX OINT
1.0000 "application " | TOPICAL_OINTMENT | Freq: Two times a day (BID) | CUTANEOUS | Status: AC
  Administered 2021-01-14 – 2021-01-17 (×8): 1 via NASAL
  Filled 2021-01-14 (×2): qty 22

## 2021-01-14 MED ORDER — HYDROMORPHONE HCL 1 MG/ML IJ SOLN
0.2500 mg | INTRAMUSCULAR | Status: DC | PRN
  Administered 2021-01-14: .5 mg via INTRAVENOUS
  Administered 2021-01-14: 0.5 mg via INTRAVENOUS

## 2021-01-14 MED ORDER — ROCURONIUM BROMIDE 10 MG/ML (PF) SYRINGE
PREFILLED_SYRINGE | INTRAVENOUS | Status: AC
  Filled 2021-01-14: qty 20

## 2021-01-14 MED ORDER — PHENYLEPHRINE 40 MCG/ML (10ML) SYRINGE FOR IV PUSH (FOR BLOOD PRESSURE SUPPORT)
PREFILLED_SYRINGE | INTRAVENOUS | Status: AC
  Filled 2021-01-14: qty 10

## 2021-01-14 MED ORDER — AMISULPRIDE (ANTIEMETIC) 5 MG/2ML IV SOLN: 10.0000 mg | Freq: Once | INTRAVENOUS | Status: DC | PRN

## 2021-01-14 MED ORDER — DEXAMETHASONE SODIUM PHOSPHATE 10 MG/ML IJ SOLN
INTRAMUSCULAR | Status: DC | PRN
  Administered 2021-01-14: 8 mg via INTRAVENOUS

## 2021-01-14 MED ORDER — SENNOSIDES-DOCUSATE SODIUM 8.6-50 MG PO TABS
1.0000 | ORAL_TABLET | Freq: Every day | ORAL | Status: DC
  Administered 2021-01-15: 1 via ORAL
  Filled 2021-01-14 (×3): qty 1

## 2021-01-14 MED ORDER — FENTANYL 50 MCG/ML IV PCA SOLN
INTRAVENOUS | Status: DC
  Administered 2021-01-14: 0 ug via INTRAVENOUS
  Administered 2021-01-14: 45 ug via INTRAVENOUS
  Administered 2021-01-14: 4 ug via INTRAVENOUS
  Administered 2021-01-15: 0 ug via INTRAVENOUS
  Filled 2021-01-14: qty 20

## 2021-01-14 MED ORDER — PHENYLEPHRINE HCL (PRESSORS) 10 MG/ML IV SOLN
INTRAVENOUS | Status: DC | PRN
  Administered 2021-01-14: 100 ug via INTRAVENOUS

## 2021-01-14 MED ORDER — ONDANSETRON HCL 4 MG/2ML IJ SOLN: 4.0000 mg | Freq: Four times a day (QID) | INTRAMUSCULAR | Status: DC | PRN

## 2021-01-14 MED ORDER — MUPIROCIN 2 % EX OINT: 1.0000 "application " | TOPICAL_OINTMENT | Freq: Two times a day (BID) | CUTANEOUS | Status: DC

## 2021-01-14 MED ORDER — SODIUM CHLORIDE 0.9% IV SOLUTION: Freq: Once | INTRAVENOUS | Status: DC

## 2021-01-14 MED ORDER — SUCCINYLCHOLINE CHLORIDE 200 MG/10ML IV SOSY
PREFILLED_SYRINGE | INTRAVENOUS | Status: AC
  Filled 2021-01-14: qty 10

## 2021-01-14 MED ORDER — LIDOCAINE 2% (20 MG/ML) 5 ML SYRINGE
INTRAMUSCULAR | Status: AC
  Filled 2021-01-14: qty 10

## 2021-01-14 MED ORDER — SODIUM CHLORIDE 0.9% FLUSH: 9.0000 mL | INTRAVENOUS | Status: DC | PRN

## 2021-01-14 MED ORDER — VANCOMYCIN HCL IN DEXTROSE 1-5 GM/200ML-% IV SOLN
INTRAVENOUS | Status: AC
  Filled 2021-01-14: qty 200

## 2021-01-14 MED ORDER — SUGAMMADEX SODIUM 200 MG/2ML IV SOLN
INTRAVENOUS | Status: DC | PRN
  Administered 2021-01-14: 200 mg via INTRAVENOUS

## 2021-01-14 MED ORDER — FENTANYL CITRATE (PF) 250 MCG/5ML IJ SOLN
INTRAMUSCULAR | Status: AC
  Filled 2021-01-14: qty 5

## 2021-01-14 MED ORDER — DIPHENHYDRAMINE HCL 12.5 MG/5ML PO ELIX
12.5000 mg | ORAL_SOLUTION | Freq: Four times a day (QID) | ORAL | Status: DC | PRN
  Filled 2021-01-14: qty 5

## 2021-01-14 MED ORDER — ALBUMIN HUMAN 5 % IV SOLN: INTRAVENOUS | Status: DC | PRN

## 2021-01-14 MED ORDER — KETOROLAC TROMETHAMINE 15 MG/ML IJ SOLN: 15.0000 mg | Freq: Four times a day (QID) | INTRAMUSCULAR | Status: DC

## 2021-01-14 MED ORDER — ONDANSETRON HCL 4 MG/2ML IJ SOLN
INTRAMUSCULAR | Status: DC | PRN
  Administered 2021-01-14: 4 mg via INTRAVENOUS

## 2021-01-14 MED ORDER — SODIUM CHLORIDE 0.9 % IV SOLN: INTRAVENOUS | Status: DC | PRN

## 2021-01-14 MED ORDER — KETOROLAC TROMETHAMINE 15 MG/ML IJ SOLN
15.0000 mg | Freq: Four times a day (QID) | INTRAMUSCULAR | Status: AC
  Administered 2021-01-14 – 2021-01-16 (×8): 15 mg via INTRAVENOUS
  Filled 2021-01-14 (×7): qty 1

## 2021-01-14 MED ORDER — PROPOFOL 10 MG/ML IV BOLUS
INTRAVENOUS | Status: AC
  Filled 2021-01-14: qty 20

## 2021-01-14 MED ORDER — ROCURONIUM BROMIDE 10 MG/ML (PF) SYRINGE
PREFILLED_SYRINGE | INTRAVENOUS | Status: DC | PRN
  Administered 2021-01-14: 80 mg via INTRAVENOUS
  Administered 2021-01-14: 20 mg via INTRAVENOUS

## 2021-01-14 MED ORDER — MIDAZOLAM HCL 2 MG/2ML IJ SOLN
INTRAMUSCULAR | Status: AC
  Filled 2021-01-14: qty 2

## 2021-01-14 MED ORDER — ENOXAPARIN SODIUM 40 MG/0.4ML ~~LOC~~ SOLN
40.0000 mg | SUBCUTANEOUS | Status: DC
  Administered 2021-01-15 – 2021-01-18 (×4): 40 mg via SUBCUTANEOUS
  Filled 2021-01-14 (×4): qty 0.4

## 2021-01-14 MED ORDER — PHENYLEPHRINE HCL-NACL 10-0.9 MG/250ML-% IV SOLN
INTRAVENOUS | Status: DC | PRN
  Administered 2021-01-14: 20 ug/min via INTRAVENOUS

## 2021-01-14 MED ORDER — BISACODYL 5 MG PO TBEC
10.0000 mg | DELAYED_RELEASE_TABLET | Freq: Every day | ORAL | Status: DC
  Administered 2021-01-15 – 2021-01-16 (×2): 10 mg via ORAL
  Filled 2021-01-14 (×3): qty 2

## 2021-01-14 MED ORDER — DIPHENHYDRAMINE HCL 50 MG/ML IJ SOLN: 12.5000 mg | Freq: Four times a day (QID) | INTRAMUSCULAR | Status: DC | PRN

## 2021-01-14 MED ORDER — DEXAMETHASONE SODIUM PHOSPHATE 10 MG/ML IJ SOLN
INTRAMUSCULAR | Status: AC
  Filled 2021-01-14: qty 1

## 2021-01-14 MED ORDER — FENTANYL CITRATE (PF) 100 MCG/2ML IJ SOLN
INTRAMUSCULAR | Status: DC | PRN
  Administered 2021-01-14 (×2): 50 ug via INTRAVENOUS
  Administered 2021-01-14 (×2): 25 ug via INTRAVENOUS
  Administered 2021-01-14: 100 ug via INTRAVENOUS

## 2021-01-14 MED ORDER — LACTATED RINGERS IV SOLN: INTRAVENOUS | Status: DC | PRN

## 2021-01-14 MED ORDER — SODIUM CHLORIDE 0.9 % IV SOLN: INTRAVENOUS | Status: DC

## 2021-01-14 MED ORDER — DEXMEDETOMIDINE (PRECEDEX) IN NS 20 MCG/5ML (4 MCG/ML) IV SYRINGE
PREFILLED_SYRINGE | INTRAVENOUS | Status: DC | PRN
  Administered 2021-01-14: 4 ug via INTRAVENOUS

## 2021-01-14 MED ORDER — BUPIVACAINE LIPOSOME 1.3 % IJ SUSP
20.0000 mL | INTRAMUSCULAR | Status: DC
  Filled 2021-01-14: qty 20

## 2021-01-14 MED ORDER — KETOROLAC TROMETHAMINE 30 MG/ML IJ SOLN
INTRAMUSCULAR | Status: DC | PRN
  Administered 2021-01-14: 30 mg via INTRAVENOUS

## 2021-01-14 MED ORDER — 0.9 % SODIUM CHLORIDE (POUR BTL) OPTIME
TOPICAL | Status: DC | PRN
  Administered 2021-01-14: 1000 mL

## 2021-01-14 SURGICAL SUPPLY — 84 items
ADH SKN CLS APL DERMABOND .7 (GAUZE/BANDAGES/DRESSINGS) ×1
BAG SPEC RTRVL LRG 6X4 10 (ENDOMECHANICALS)
BLADE CLIPPER SURG (BLADE) ×3 IMPLANT
CANISTER SUCT 3000ML PPV (MISCELLANEOUS) ×3 IMPLANT
CATH THORACIC 28FR (CATHETERS) IMPLANT
CATH THORACIC 36FR (CATHETERS) IMPLANT
CATH THORACIC 36FR RT ANG (CATHETERS) IMPLANT
CLIP VESOCCLUDE MED 6/CT (CLIP) IMPLANT
CNTNR URN SCR LID CUP LEK RST (MISCELLANEOUS) ×4 IMPLANT
CONN ST 1/4X3/8  BEN (MISCELLANEOUS) ×3
CONN ST 1/4X3/8 BEN (MISCELLANEOUS) IMPLANT
CONN Y 3/8X3/8X3/8  BEN (MISCELLANEOUS)
CONN Y 3/8X3/8X3/8 BEN (MISCELLANEOUS) IMPLANT
CONT SPEC 4OZ STRL OR WHT (MISCELLANEOUS) ×9
DERMABOND ADVANCED (GAUZE/BANDAGES/DRESSINGS) ×1
DERMABOND ADVANCED .7 DNX12 (GAUZE/BANDAGES/DRESSINGS) IMPLANT
DRAIN CHANNEL 28F RND 3/8 FF (WOUND CARE) ×2 IMPLANT
DRAIN CHANNEL 32F RND 10.7 FF (WOUND CARE) IMPLANT
DRAPE CV SPLIT W-CLR ANES SCRN (DRAPES) ×3 IMPLANT
DRAPE ORTHO SPLIT 77X108 STRL (DRAPES) ×3
DRAPE SURG ORHT 6 SPLT 77X108 (DRAPES) ×2 IMPLANT
ELECT BLADE 6.5 EXT (BLADE) ×3 IMPLANT
ELECT REM PT RETURN 9FT ADLT (ELECTROSURGICAL) ×3
ELECTRODE REM PT RTRN 9FT ADLT (ELECTROSURGICAL) ×2 IMPLANT
GAUZE SPONGE 4X4 12PLY STRL (GAUZE/BANDAGES/DRESSINGS) ×3 IMPLANT
GLOVE SURG SIGNA 7.5 PF LTX (GLOVE) ×6 IMPLANT
GOWN STRL REUS W/ TWL LRG LVL3 (GOWN DISPOSABLE) ×4 IMPLANT
GOWN STRL REUS W/ TWL XL LVL3 (GOWN DISPOSABLE) ×2 IMPLANT
GOWN STRL REUS W/TWL LRG LVL3 (GOWN DISPOSABLE) ×6
GOWN STRL REUS W/TWL XL LVL3 (GOWN DISPOSABLE) ×3
KIT BASIN OR (CUSTOM PROCEDURE TRAY) ×3 IMPLANT
KIT SUCTION CATH 14FR (SUCTIONS) ×3 IMPLANT
KIT TURNOVER KIT B (KITS) ×3 IMPLANT
NDL HYPO 25GX1X1/2 BEV (NEEDLE) ×2 IMPLANT
NDL SPNL 18GX3.5 QUINCKE PK (NEEDLE) IMPLANT
NDL SPNL 22GX3.5 QUINCKE BK (NEEDLE) ×2 IMPLANT
NEEDLE HYPO 25GX1X1/2 BEV (NEEDLE) ×3 IMPLANT
NEEDLE SPNL 18GX3.5 QUINCKE PK (NEEDLE) IMPLANT
NEEDLE SPNL 22GX3.5 QUINCKE BK (NEEDLE) ×3 IMPLANT
NS IRRIG 1000ML POUR BTL (IV SOLUTION) ×8 IMPLANT
PACK CHEST (CUSTOM PROCEDURE TRAY) ×3 IMPLANT
PAD ARMBOARD 7.5X6 YLW CONV (MISCELLANEOUS) ×6 IMPLANT
POUCH ENDO CATCH II 15MM (MISCELLANEOUS) IMPLANT
POUCH SPECIMEN RETRIEVAL 10MM (ENDOMECHANICALS) IMPLANT
SEALANT PROGEL (MISCELLANEOUS) IMPLANT
SEALANT SURG COSEAL 4ML (VASCULAR PRODUCTS) IMPLANT
SEALANT SURG COSEAL 8ML (VASCULAR PRODUCTS) IMPLANT
SOL ANTI FOG 6CC (MISCELLANEOUS) ×2 IMPLANT
SOLUTION ANTI FOG 6CC (MISCELLANEOUS) ×1
SPECIMEN JAR MEDIUM (MISCELLANEOUS) IMPLANT
SPONGE INTESTINAL PEANUT (DISPOSABLE) ×4 IMPLANT
SPONGE TONSIL TAPE 1 RFD (DISPOSABLE) ×3 IMPLANT
SUT PROLENE 4 0 RB 1 (SUTURE)
SUT PROLENE 4-0 RB1 .5 CRCL 36 (SUTURE) IMPLANT
SUT SILK  1 MH (SUTURE) ×6
SUT SILK 1 MH (SUTURE) ×2 IMPLANT
SUT SILK 1 TIES 10X30 (SUTURE) IMPLANT
SUT SILK 2 0 SH (SUTURE) ×1 IMPLANT
SUT SILK 2 0SH CR/8 30 (SUTURE) IMPLANT
SUT SILK 3 0SH CR/8 30 (SUTURE) IMPLANT
SUT VIC AB 0 CTX 27 (SUTURE) IMPLANT
SUT VIC AB 1 CTX 27 (SUTURE) IMPLANT
SUT VIC AB 2-0 CT1 27 (SUTURE)
SUT VIC AB 2-0 CT1 TAPERPNT 27 (SUTURE) IMPLANT
SUT VIC AB 2-0 CTX 36 (SUTURE) IMPLANT
SUT VIC AB 3-0 MH 27 (SUTURE) IMPLANT
SUT VIC AB 3-0 SH 27 (SUTURE)
SUT VIC AB 3-0 SH 27X BRD (SUTURE) IMPLANT
SUT VIC AB 3-0 X1 27 (SUTURE) ×3 IMPLANT
SUT VICRYL 0 UR6 27IN ABS (SUTURE) IMPLANT
SUT VICRYL 2 TP 1 (SUTURE) IMPLANT
SWAB COLLECTION DEVICE MRSA (MISCELLANEOUS) IMPLANT
SWAB CULTURE ESWAB REG 1ML (MISCELLANEOUS) IMPLANT
SYR 10ML LL (SYRINGE) IMPLANT
SYR 30ML LL (SYRINGE) ×3 IMPLANT
SYSTEM SAHARA CHEST DRAIN ATS (WOUND CARE) ×3 IMPLANT
TAPE CLOTH SURG 4X10 WHT LF (GAUZE/BANDAGES/DRESSINGS) ×1 IMPLANT
TIP APPLICATOR SPRAY EXTEND 16 (VASCULAR PRODUCTS) IMPLANT
TOWEL GREEN STERILE (TOWEL DISPOSABLE) ×3 IMPLANT
TOWEL GREEN STERILE FF (TOWEL DISPOSABLE) IMPLANT
TRAP SPECIMEN MUCUS 40CC (MISCELLANEOUS) IMPLANT
TRAY FOLEY MTR SLVR 16FR STAT (SET/KITS/TRAYS/PACK) ×3 IMPLANT
TROCAR XCEL BLADELESS 5X75MML (TROCAR) ×3 IMPLANT
WATER STERILE IRR 1000ML POUR (IV SOLUTION) ×5 IMPLANT

## 2021-01-14 NOTE — Progress Notes (Signed)
PT Cancellation Note  Patient Details Name: [name] MRN: [id number] DOB: [birth year]   Cancelled Treatment:    Reason Eval/Treat Not Completed: (P) Patient at procedure or test/unavailable (Pt off unit for VATS and drainage of empyema.  Will f/u per POC.)   [name] [name] 01/14/2021, 10:32 AM

## 2021-01-14 NOTE — Anesthesia Procedure Notes (Signed)
Arterial Line Insertion Start/End1/27/2022 10:20 AM, 01/14/2021 10:25 AM Performed by: Duane Boston, MD, anesthesiologist  Preanesthetic checklist: patient identified, IV checked, site marked, risks and benefits discussed, surgical consent, monitors and equipment checked, pre-op evaluation, timeout performed and anesthesia consent Patient sedated Left, radial was placed Catheter size: 20 G Hand hygiene performed  and maximum sterile barriers used  Allen's test indicative of satisfactory collateral circulation Attempts: 2 Procedure performed without using ultrasound guided technique. Ultrasound Notes:anatomy identified Following insertion, dressing applied and Biopatch. Post procedure assessment: normal  Patient tolerated the procedure well with no immediate complications.

## 2021-01-14 NOTE — Progress Notes (Signed)
[facility] PROGRESS NOTE   [name] [id number] DOB: 05-Jun-1976 DOA: 01/09/2021  PCP: Langston Reusing, NP  Brief History/Interval Summary: 45 y.o. female with medical history significant of alcohol abuse, tobacco use, history of cervical cancer, atopic dermatitis admitted to [facility] on 01/04/2021 for feeling weak for about 2 weeks.  She was treated for sepsis and acute hypoxic respiratory failure secondary to COVID-19 infection with superimposed bacterial infection.  She was treated with steroids and a 5-day course of remdesivir.  Work-up also revealed a right moderate pleural effusion.  Pleural fluid culture grew Streptococcus intermedius.  Patient underwent ultrasound-guided thoracentesis on 1/18 and thoracostomy under CT guidance by IR on 1/21.  Right chest tube draining mucopurulent debris.  ID was consulted and recommended antibiotics for at least 3 to 4 weeks.  She was sent to [facility] for evaluation by CT surgery for more definitive treatment of multiloculated effusion.    Reason for Visit: Empyema  Consultants: Cardiothoracic surgery  Procedures: Right chest tube placement, VATS  Antibiotics: Anti-infectives (From admission, onward)   Start     Dose/Rate Route Frequency Ordered Stop   01/14/21 0600  vancomycin (VANCOCIN) IVPB 1000 mg/200 mL premix        1,000 mg 200 mL/hr over 60 Minutes Intravenous On call to O.R. 01/13/21 1453 01/15/21 0559   01/09/21 2345  Ampicillin-Sulbactam (UNASYN) 3 g in sodium chloride 0.9 % 100 mL IVPB        3 g 200 mL/hr over 30 Minutes Intravenous Every 6 hours 01/09/21 2247        Subjective/Interval History: Patient continues to have pain in the right chest area with some difficulty breathing.  No other symptoms.  No further episodes of diarrhea.   Assessment/Plan:  Right-sided empyema s/p pigtail catheter 1/21 Pleural fluid cultures grew Streptococcus intermedius. Patient underwent thoracentesis on 1/18  followed by a pigtail catheter placement under CT guidance by interventional radiology on 1/21. Cardiothoracic surgery is following.  VATS 01/14/21 Patient was also seen by infectious disease recommending antibiotics for 3 to 4 weeks. Currently on Unasyn likely transition to Augmentin depending on clinical progress. Continue probiotics Increase analgesia given ongoing pleuritic chest pain - add percocet 5/325 prn - now on fentanyl pump post VATS 01/14/21 per surgery  Acute blood loss anemia, ongoing Patient noted to have chronic anemia.   Previous drop to 6.0 in the setting of above - s/p 1u PRBC - mild downtrend overnight No further bleeding noted - follow post operatively  Pneumonia due to COVID-19 Completed 5-day course of Remdesivir. She has been weaned off of oxygen. Currently saturating normal on room air. Remains on dexamethasone 6 mg daily. Continue incentive spirometer. Based on x-ray findings from the time of admission and her symptoms it looks like her symptoms are primarily due to empyema.  She has a positive test result from 1/17.  Does not need more than 10 days of isolation.  She can come off isolation on 1/28.  Hyponatremia/hypomagnesemia/hypokalemia, improving Likely hypovolemic vs SIADH from lung infection Sodium was 114 on 1/17.  Labs stabilizing now - likely at baseline  History of alcohol abuse No evidence for withdrawal. Continue thiamine folate multivitamins. CIWA.  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 Continues on midodrine.  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  To be rechecked in the outpatient setting in a few weeks.    Positive hep C antibody/mild transaminitis Patient also with history of alcohol use.  HCV titer and genotype  pending.   Ultrasound of the abdomen showed diffusely increased hepatic echogenicity compatible with hepatic steatosis.  Developing cirrhotic changes were also noted.  Cholelithiasis and biliary sludge was also noted.  No cholecystitis was noted. HIV screening was negative.   Will need outpatient consultation with ID as well as gastroenterology.  Moderate protein calorie malnutrition Encourage oral intake.  Tobacco abuse Nicotine patch.   DVT Prophylaxis: Lovenox Code Status: Full code Family Communication: Discussed with the patient. Disposition Plan: Hopefully return home when improved, will need CT surgery sign off as well.  Status is: Inpatient  Remains inpatient appropriate because:IV treatments appropriate due to intensity of illness or inability to take PO and Inpatient level of care appropriate due to severity of illness   Dispo: The patient is from: Home              Anticipated d/c is to: Home              Anticipated d/c date is: 3 days              Patient currently is not medically stable to d/c.   Difficult to place patient No      Medications:  Scheduled: . Chlorhexidine Gluconate Cloth  6 each Topical Q0600  . dexamethasone (DECADRON) injection  6 mg Intravenous Q24H  . enoxaparin (LOVENOX) injection  40 mg Subcutaneous Q24H  . feeding supplement  237 mL Oral BID BM  . folic acid  1 mg Oral Daily  . midodrine  5 mg Oral BID WC  . multivitamin with minerals  1 tablet Oral Daily  . mupirocin ointment  1 application Nasal BID  . nicotine  14 mg Transdermal Daily  . pantoprazole  40 mg Oral Q1200  . saccharomyces boulardii  250 mg Oral BID  . thiamine  100 mg Oral Daily   Continuous: . ampicillin-sulbactam (UNASYN) IV 3 g (01/14/21 0538)  . vancomycin     [id number] & mag hydroxide-simeth, calcium carbonate, diphenhydrAMINE, guaiFENesin-dextromethorphan, ibuprofen, loperamide, methocarbamol, oxyCODONE-acetaminophen   Objective:  Vital Signs  Vitals:    01/14/21 0000 01/14/21 0050 01/14/21 0555 01/14/21 0730  BP:  (!) 113/56 119/69 132/66  Pulse: (!) 59 60 (!) 52 (!) 52  Resp:  18 17 18   Temp:  98.6 F (37 C) 98.6 F (37 C) 98 F (36.7 C)  TempSrc:  Oral Oral Oral  SpO2:  96% 100% 99%    Intake/Output Summary (Last 24 hours) at 01/14/2021 0755 Last data filed at 01/14/2021 [id number] Gross per 24 hour  Intake 880 ml  Output 3665 ml  Net -2785 ml   There were no vitals filed for this visit.  General appearance: Awake alert.  In no distress Resp: Chest tube on the right.  Diminished air entry on the right.  No wheezing or rhonchi. Cardio: S1-S2 is normal regular.  No S3-S4.  No rubs murmurs or bruit GI: Abdomen is soft.  Nontender nondistended.  Bowel sounds are present normal.  No masses organomegaly Extremities: No edema.  Full range of motion of lower extremities. Neurologic: Alert and oriented x3.  No focal neurological deficits.   Lab Results:  Data Reviewed: I have personally reviewed following labs and imaging studies  CBC: Recent Labs  Lab 01/08/21 0623 01/09/21 0707 01/10/21 0127  01/11/21 0700 01/12/21 0244 01/12/21 1004 01/13/21 0829 01/14/21 0248  WBC 10.9* 7.5 8.4 9.3 12.3*  --  14.5* 14.1*  NEUTROABS 7.7 5.1  --   --   --   --   --   --   HGB 7.7* 7.5* 8.2* 8.6* 6.0* 8.9* 9.0* 7.9*  HCT 20.8* 21.3* 23.8* 25.2* 17.5* 25.3* 25.1* 22.5*  MCV 92.4 93.8 97.9 98.1 98.3  --  98.8 98.3  PLT 165 167 189 239 206  --  278 315    Basic Metabolic Panel: Recent Labs  Lab 01/08/21 0623 01/09/21 0707 01/10/21 0127 01/11/21 0700 01/12/21 0244 01/13/21 0829 01/14/21 0248  NA 129* 130* 129* 129* 127* 129* 127*  K 3.6 3.9 3.6 3.7 3.2* 4.2 3.8  CL 90* 96* 96* 97* 95* 99 99  CO2 26 25 22 24 22  21* 20*  GLUCOSE 124* 133* 141* 87 104* 76 93  BUN 10 10 11 10 9 7 7   CREATININE 0.57 0.55 0.68 0.61 0.49 0.57 0.51  CALCIUM 7.6* 7.2* 7.3* 7.8* 7.7* 8.2* 7.8*  MG 1.8 1.6* 1.4* 1.7  --  1.5*  --   PHOS 3.5  --  3.2  --    --   --   --     GFR: Estimated Creatinine Clearance: 70.3 mL/min (by C-G formula based on SCr of 0.51 mg/dL).  Liver Function Tests: Recent Labs  Lab 01/11/21 0700 01/12/21 0244 01/13/21 0829 01/14/21 0248  AST 111* 85* 146* 92*  ALT 51* 48* 73* 61*  ALKPHOS 63 50 58 50  BILITOT 1.0 0.9 1.4* 1.0  PROT 6.0* 5.1* 6.1* 5.5*  ALBUMIN 2.0* 1.9* 2.3* 2.1*      CBG: No results for input(s): GLUCAP in the last 168 hours.     Recent Results (from the past 240 hour(s))  Resp Panel by RT-PCR (Flu A&B, Covid) Nasopharyngeal Swab     Status: Abnormal   Collection Time: 01/04/21  3:40 PM   Specimen: Nasopharyngeal Swab; Nasopharyngeal(NP) swabs in vial transport medium  Result Value Ref Range Status   SARS Coronavirus 2 by RT PCR POSITIVE (A) NEGATIVE Final    Comment: RESULT CALLED TO, READ BACK BY AND VERIFIED WITH: CANDICE KIO 01/04/21 AT 4008 BY ACR (NOTE) SARS-CoV-2 target nucleic acids are DETECTED.  The SARS-CoV-2 RNA is generally detectable in upper respiratory specimens during the acute phase of infection. Positive results are indicative of the presence of the identified virus, but do not rule out bacterial infection or co-infection with other pathogens not detected by the test. Clinical correlation with patient history and other diagnostic information is necessary to determine patient infection status. The expected result is Negative.  Fact Sheet for Patients: EntrepreneurPulse.com.au  Fact Sheet for Healthcare Providers: IncredibleEmployment.be  This test is not yet approved or cleared by the [place] FDA and  has been authorized for detection and/or diagnosis of SARS-CoV-2 by FDA under an Emergency Use Authorization (EUA).  This EUA will remain in effect (meaning this test can b e used) for the duration of  the COVID-19 declaration under Section 564(b)(1) of the Act, 21 U.S.C. section 360bbb-3(b)(1), unless the  authorization is terminated or revoked sooner.     Influenza A by PCR NEGATIVE NEGATIVE Final   Influenza B by PCR NEGATIVE NEGATIVE Final    Comment: (NOTE) The Xpert Xpress SARS-CoV-2/FLU/RSV plus assay is intended as an aid in the diagnosis of influenza from Nasopharyngeal swab specimens and should not be used as a sole  basis for treatment. Nasal washings and aspirates are unacceptable for Xpert Xpress SARS-CoV-2/FLU/RSV testing.  Fact Sheet for Patients: EntrepreneurPulse.com.au  Fact Sheet for Healthcare Providers: IncredibleEmployment.be  This test is not yet approved or cleared by the [place] FDA and has been authorized for detection and/or diagnosis of SARS-CoV-2 by FDA under an Emergency Use Authorization (EUA). This EUA will remain in effect (meaning this test can be used) for the duration of the COVID-19 declaration under Section 564(b)(1) of the Act, 21 U.S.C. section 360bbb-3(b)(1), unless the authorization is terminated or revoked.  Performed at [facility], [address] [place], [place] [address]   Blood culture (routine x 2)     Status: None   Collection Time: 01/04/21  3:45 PM   Specimen: BLOOD  Result Value Ref Range Status   Specimen Description BLOOD BLOOD RIGHT HAND  Final   Special Requests   Final    BOTTLES DRAWN AEROBIC AND ANAEROBIC Blood Culture adequate volume   Culture   Final    NO GROWTH 5 DAYS Performed at [facility], [address] [place], [place] [address]    Report Status 01/09/2021 FINAL  Final  Blood culture (routine x 2)     Status: None   Collection Time: 01/04/21  3:53 PM   Specimen: BLOOD  Result Value Ref Range Status   Specimen Description BLOOD LEFT ANTECUBITAL  Final   Special Requests   Final    BOTTLES DRAWN AEROBIC AND ANAEROBIC Blood Culture adequate volume   Culture   Final    NO GROWTH 5 DAYS Performed at [facility], [address], [place], [place] [address]    Report Status 01/09/2021 FINAL  Final  Body fluid culture     Status: None   Collection Time: 01/05/21  3:55 PM   Specimen: PATH Cytology Pleural fluid  Result Value Ref Range Status   Specimen Description   Final    PLEURAL Performed at [facility], [address] [place], [place] [address]    Special Requests   Final    PLEURAL Performed at [facility], [address] [place], [place] [address]    Gram Stain   Final    ABUNDANT WBC PRESENT, PREDOMINANTLY PMN NO ORGANISMS SEEN    Culture   Final    MODERATE STREPTOCOCCUS INTERMEDIUS CRITICAL RESULT CALLED TO, READ BACK BY AND VERIFIED WITH: DR Delaine Lame @1118  01/07/21 EB Performed at [facility], [place] [address], [place], [place] [address]    Report Status 01/08/2021 FINAL  Final   Organism ID, Bacteria STREPTOCOCCUS INTERMEDIUS  Final      Susceptibility   Streptococcus intermedius - MIC*    PENICILLIN <=0.06 SENSITIVE Sensitive     CEFTRIAXONE 0.25 SENSITIVE Sensitive     ERYTHROMYCIN >=8 RESISTANT Resistant     LEVOFLOXACIN 0.5 SENSITIVE Sensitive     VANCOMYCIN 0.25 SENSITIVE Sensitive     * MODERATE STREPTOCOCCUS INTERMEDIUS  Fungus Culture With Stain     Status: None (Preliminary result)   Collection Time: 01/05/21  3:55 PM   Specimen: PATH Cytology Pleural fluid  Result Value Ref Range Status   Fungus Stain Final report  Final    Comment: (NOTE) Performed At: [facility] [place], [place] [id number] Rush Farmer MD [id number]    Fungus (Mycology) Culture PENDING  Incomplete   Fungal Source PLEURAL  Final    Comment: Performed at [facility], [address] [place], [place] [address]  Acid Fast Smear (AFB)     Status: None   Collection Time: 01/05/21  3:55 PM   Specimen: PATH Cytology Pleural fluid  Result Value Ref Range Status   AFB Specimen Processing Concentration  Final   Acid Fast Smear Negative  Final     Comment: (NOTE) Performed At: [facility] [place], [place] [id number] Rush Farmer MD [id number]    Source (AFB) PLEURAL  Final    Comment: Performed at [facility], [address] [place], [place] [address]  Fungus Culture Result     Status: None   Collection Time: 01/05/21  3:55 PM  Result Value Ref Range Status   Result 1 Comment  Final    Comment: (NOTE) KOH/Calcofluor preparation:  no fungus observed. Performed At: [facility] [place], [place] [id number] Rush Farmer MD [id number]   MRSA PCR Screening     Status: Abnormal   Collection Time: 01/13/21  8:57 PM   Specimen: Nasal Mucosa; Nasopharyngeal  Result Value Ref Range Status   MRSA by PCR POSITIVE (A) NEGATIVE Final    Comment:        The GeneXpert MRSA Assay (FDA approved for NASAL specimens only), is one component of a comprehensive MRSA colonization surveillance program. It is not intended to diagnose MRSA infection nor to guide or monitor treatment for MRSA infections. RESULT CALLED TO, READ BACK BY AND VERIFIED WITH: Gearldine Shown RN 01/14/21 0211 JDW Performed at [facility], [place] [address] [place], [place] [address]       Radiology Studies: DG Chest [place] 1 View  Result Date: 01/14/2021 CLINICAL DATA:  45 year old female with history of empyema, chest tube placed. EXAM: PORTABLE CHEST 1 VIEW COMPARISON:  Portable chest 01/13/2021 and earlier. FINDINGS: Portable AP upright view at 0518 hours. Stable pigtail right chest tube near the lung base. Persistent although smaller residual right side hydropneumothorax. Less pleural air now. Patchy and confluent opacity persists at the right costophrenic angle. Stable cardiac size and mediastinal contours. Stable, negative left lung. Visualized tracheal air column is within normal limits. No acute osseous abnormality identified. Negative visible bowel gas pattern. IMPRESSION: 1. Decreased right  hydropneumothorax since yesterday, small to moderate residual. Stable right chest tube. 2. No new cardiopulmonary abnormality. Electronically Signed   By: Genevie Ann M.D.   On: 01/14/2021 06:24   DG Chest Port 1 View  Result Date: 01/13/2021 CLINICAL DATA:  Empyema.  Chest tube. EXAM: PORTABLE CHEST 1 VIEW COMPARISON:  01/12/2021. FINDINGS: Right chest tube in stable position. Right hydropneumothorax again noted. Slight interim improvement from prior exam. Persistent right base atelectasis/infiltrate. Heart size stable. IMPRESSION: Right chest tube in stable position. Right hydropneumothorax again noted. Slight interim improvement from prior exam. Persistent right base atelectasis/infiltrate. Electronically Signed   By: Marcello Moores  Register   On: 01/13/2021 07:57       LOS: 5 days   [facility] Pager on [email or web address]  01/14/2021, 7:55 AM

## 2021-01-14 NOTE — Progress Notes (Signed)
Pharmacy Antibiotic Note  [name] is a 46 y.o. female admitted on 01/09/2021 with Step intermedius empyema.  Pharmacy has been consulted for ampicillin/sulbactam dosing.  Good renal function. Continuing from [name]:   Continue amp/sulbactam 3g Q 6 hr Monitor cultures, clinical status, renal fx, and f/u duration   Temp (24hrs), Avg:98.1 F (36.7 C), Min:97.1 F (36.2 C), Max:98.6 F (37 C)  Recent Labs  Lab 01/10/21 0127 01/11/21 0700 01/12/21 0244 01/13/21 0829 01/14/21 0248  WBC 8.4 9.3 12.3* 14.5* 14.1*  CREATININE 0.68 0.61 0.49 0.57 0.51    Estimated Creatinine Clearance: 70.5 mL/min (by C-G formula based on SCr of 0.51 mg/dL).    Allergies  Allergen Reactions  . Acetaminophen     Intolerance, hx of tylenol OD     Antimicrobials this admission: Ampsulb 1/19 >>  CTX 1/17>> 1/18  Azithro 1/17 Remdesevir 1/17>> 1/21  Microbiology results: 1/17 BCx: ngtd 1/18 Pleura Cx: Strep intermedius S PCN    Thank you for allowing pharmacy to be a part of this patient's care.   [name], [name], BCCP Clinical Pharmacist  01/14/2021 1:44 PM   [facility] pharmacy phone numbers are listed on [email or web address]

## 2021-01-14 NOTE — Op Note (Signed)
NAME: RICCI, PAFF MEDICAL RECORD [id number] ACCOUNT [id number] DATE OF BIRTH:[birth year] FACILITY: [facility] LOCATION: [facility] PHYSICIAN:Jametta Moorehead Chaya Jan, MD  OPERATIVE REPORT  DATE OF PROCEDURE:  01/14/2021  PREOPERATIVE DIAGNOSIS:  Right empyema.  POSTOPERATIVE DIAGNOSIS:  Early organizing empyema, right chest.  PROCEDURE:   Right video-assisted thoracoscopy, Drainage of hemothorax and empyema, Visceral and parietal pleural decortication.  SURGEON:  Modesto Charon, MD  ASSISTANT:  Ellwood Handler, PA-C  ANESTHESIA:  General.  FINDINGS:  Right empyema.  Also clotted hemothorax with a moderate amount of relatively recent blood.  Relatively thin visceral parietal pleural peel.  CLINICAL NOTE:  [name] is a 45 year old woman who was admitted with fatigue and general malaise.  She was found to have sepsis.  She also tested positive for COVID-19.  Chest x-ray showed a moderate right pleural effusion.  Thoracentesis was  performed and the fluid grew Strep intermedius.  She had a pigtail catheter placed, but had incomplete reexpansion of the right lung.  Thrombolytic therapy was attempted, but there was bleeding and still incomplete reexpansion.  The patient was advised  to undergo surgical decortication.  The indications, risks, benefits, and alternatives were discussed in detail with the patient.  She understood and accepted the risks and agreed to proceed.  OPERATIVE NOTE:  [name] was brought to the operating room on 01/14/2021.  Anesthesia placed an arterial blood pressure monitoring line and established adequate intravenous access.  She was anesthetized and intubated with a double lumen endotracheal  tube.  She was already receiving Unasyn.  Intravenous vancomycin was administered as well.  A Foley catheter was placed.  Sequential compression devices were placed on the calves for DVT prophylaxis.  She was placed in a left lateral decubitus position.  The pigtail  catheter was removed.  The right chest was prepped and draped in the usual sterile fashion.   Single lung ventilation of the left lung was initiated and was tolerated well throughout the procedure.  An incision was made in the 6th interspace anterolaterally.  It was carried through the skin and subcutaneous tissue.  The serratus muscle fibers were separated and the intercostal muscle fibers were divided. A sucker was placed into the chest.  A  moderate amount of bloody fluid was evacuated.  A finger was used to palpate and make sure that there was no lung attached to the chest wall where the camera would be inserted.  A small incision was made in the 9th interspace and the 5 mm port was  inserted.  The thoracoscope was advanced into the chest.  There was a moderate amount of relatively fresh blood inferiorly along the diaphragm and in the costophrenic angle.  More superiorly, there was some murky fluid and there were visceral and  parietal pleural peels present.  Both were relatively mild.  The clot was evacuated.  The chest was copiously irrigated with saline.  The upper lobe was relatively spared.  The lower and middle lobes were entrapped by a fibrous peel.  Dissecting the visceral peel off,  the fissure was entered and cleared.  There was no fluid or significant debris within the fissure.  The lower lobe was decorticated after developing a plane.  The peel was relatively thin and came off relatively easily.  The middle lobe peel came off more intact.  The lung was intermittently reinflated to ensure there would be adequate reexpansion.  After adequately decorticating the lower and middle lobes, the chest was once again  copiously irrigated with warm saline.  A test inflation showed some air leakage from the small area of denuded parenchyma on the middle lobe.  A second incision was made anterior to the scope incision and two 28-French Blake drains were placed, one was  advanced posteriorly and the other  laterally.  Both were secured with a #1 silk sutures.  Dual-lung ventilation was resumed and there was good reexpansion of all 3 lobes.  The working incision was closed with #1 Vicryl fascial suture followed by 2-0  Vicryl subcutaneous suture and 3-0 Vicryl subcuticular suture.  Dermabond was applied.  Chest tubes were placed to a Pleur-evac on suction.  The patient then was extubated in the operating room.  She recovered there before returning to her telemetry bed.   All sponge, needle and instrument counts were correct at the end of the procedure.  IN/NUANCE  D:01/14/2021 T:01/14/2021 JOB:[id number]

## 2021-01-14 NOTE — Interval H&P Note (Signed)
History and Physical Interval Note:   CXR today shows improvement but still has a space and some undrained fluid/ blood/ inflammatory debris. We discussed options of continued CT drainage, repeat thrombolytics or VATS for drainage and decortication. She wishes to proceed with VATS for definitive treatment.  She understands the indications, risks, benefits and alternatives. 01/14/2021 9:03 AM  [name]  has presented today for surgery, with the diagnosis of RIGHT EMPYEMA.  The various methods of treatment have been discussed with the patient and family. After consideration of risks, benefits and other options for treatment, the patient has consented to  Procedure(s): VIDEO ASSISTED THORACOSCOPY (VATS)/DRAIN EMPYEMA (Right) DECORTICATION (Right) as a surgical intervention.  The patient's history has been reviewed, patient examined, no change in status, stable for surgery.  I have reviewed the patient's chart and labs.  Questions were answered to the patient's satisfaction.     Melrose Nakayama

## 2021-01-14 NOTE — Anesthesia Postprocedure Evaluation (Signed)
Anesthesia Post Note  Patient: [name]  Procedure(s) Performed: VIDEO ASSISTED THORACOSCOPY (VATS)/DRAIN EMPYEMA (Right Chest) DECORTICATION (Right )     Patient location during evaluation: PACU Anesthesia Type: General Level of consciousness: sedated Pain management: pain level controlled Vital Signs Assessment: post-procedure vital signs reviewed and stable Respiratory status: spontaneous breathing and respiratory function stable Cardiovascular status: stable Postop Assessment: no apparent nausea or vomiting Anesthetic complications: no   No complications documented.  Last Vitals:  Vitals:   01/14/21 1221 01/14/21 1248  BP: (!) 89/43 (!) 92/59  Pulse: 78 61  Resp: 14 18  Temp: (!) 36.2 C   SpO2: 100% 100%               Devan Babino DANIEL

## 2021-01-14 NOTE — Progress Notes (Signed)
Patient back from PACU with pain 10 of 10.  Fentanyl PCA pump started.  10 mg oxycodone given po.  Vital signs stable.  RT chest tube site intact set to -20 cm, no air link noted. Arterial line set up, pt placed on telemetry.

## 2021-01-14 NOTE — Transfer of Care (Signed)
Immediate Anesthesia Transfer of Care Note  Patient: [name]  Procedure(s) Performed: VIDEO ASSISTED THORACOSCOPY (VATS)/DRAIN EMPYEMA (Right Chest) DECORTICATION (Right )  Patient Location: PACU  Anesthesia Type:General  Level of Consciousness: awake, alert  and oriented  Airway & Oxygen Therapy: Patient Spontanous Breathing and Patient connected to face mask oxygen  Post-op Assessment: Report given to RN and Post -op Vital signs reviewed and stable  Post vital signs: Reviewed and stable  Last Vitals:  Vitals Value Taken Time  BP    Temp    Pulse    Resp    SpO2      Last Pain:  Vitals:   01/14/21 1156  TempSrc: Oral  PainSc:       Patients Stated Pain Goal: 0 (02/58/52 7782)  Complications: No complications documented.

## 2021-01-14 NOTE — Brief Op Note (Signed)
01/09/2021 - 01/14/2021  12:24 PM  PATIENT:  [name]  45 y.o. female  PRE-OPERATIVE DIAGNOSIS:  RIGHT EMPYEMA  POST-OPERATIVE DIAGNOSIS:  RIGHT EMPYEMA  PROCEDURE:  Procedure(s):  VIDEO ASSISTED THORACOSCOPY  DRAIN EMPYEMA (Right) DECORTICATION (Right)  SURGEON:  Surgeon(s) and Role:    * Melrose Nakayama, MD - Primary  PHYSICIAN ASSISTANT: Ellwood Handler PA-C  ANESTHESIA:   general  EBL:  150 mL   BLOOD ADMINISTERED:none  DRAINS: 28 Blake Drain x 2   LOCAL MEDICATIONS USED:  NONE  SPECIMEN:  Source of Specimen:  Pleural Fluid, Pleural Peel  ( Parietal/Visceral)  DISPOSITION OF SPECIMEN:  Microbiology/Pathology  COUNTS:  YES  TOURNIQUET:  * No tourniquets in log *  DICTATION: .Dragon Dictation  PLAN OF CARE: Return to 4E  PATIENT DISPOSITION:  PACU - hemodynamically stable.   Delay start of Pharmacological VTE agent (>24hrs) due to surgical blood loss or risk of bleeding: yes

## 2021-01-14 NOTE — Anesthesia Procedure Notes (Signed)
Procedure Name: Intubation Date/Time: 01/14/2021 10:16 AM Performed by: Inda Coke, CRNA Pre-anesthesia Checklist: Patient identified, Emergency Drugs available, Suction available and Patient being monitored Patient Re-evaluated:Patient Re-evaluated prior to induction Oxygen Delivery Method: Circle System Utilized Preoxygenation: Pre-oxygenation with 100% oxygen Induction Type: IV induction Ventilation: Mask ventilation without difficulty Laryngoscope Size: Mac and 3 Grade View: Grade I Tube type: Oral Endobronchial tube: Left, Double lumen EBT, EBT position confirmed by auscultation and EBT position confirmed by fiberoptic bronchoscope Number of attempts: 1 Airway Equipment and Method: Stylet and Oral airway Placement Confirmation: ETT inserted through vocal cords under direct vision,  positive ETCO2 and breath sounds checked- equal and bilateral Secured at: 28 cm Tube secured with: Tape Dental Injury: Teeth and Oropharynx as per pre-operative assessment

## 2021-01-14 NOTE — Progress Notes (Signed)
MRSA swab pre surgical came back positive for MRSA

## 2021-01-15 ENCOUNTER — Inpatient Hospital Stay (HOSPITAL_COMMUNITY): Payer: Self-pay

## 2021-01-15 ENCOUNTER — Encounter (HOSPITAL_COMMUNITY): Payer: Self-pay | Admitting: Thoracic Surgery (Cardiothoracic Vascular Surgery)

## 2021-01-15 LAB — COMPREHENSIVE METABOLIC PANEL
ALT: 51 U/L — ABNORMAL HIGH (ref 0–44)
AST: 65 U/L — ABNORMAL HIGH (ref 15–41)
Albumin: 2.1 g/dL — ABNORMAL LOW (ref 3.5–5.0)
Alkaline Phosphatase: 46 U/L (ref 38–126)
Anion gap: 10 (ref 5–15)
BUN: 8 mg/dL (ref 6–20)
CO2: 19 mmol/L — ABNORMAL LOW (ref 22–32)
Calcium: 7.2 mg/dL — ABNORMAL LOW (ref 8.9–10.3)
Chloride: 98 mmol/L (ref 98–111)
Creatinine, Ser: 0.54 mg/dL (ref 0.44–1.00)
GFR, Estimated: >60 mL/min (ref >60)
Glucose, Bld: 125 mg/dL — ABNORMAL HIGH (ref 70–99)
Potassium: 3.4 mmol/L — ABNORMAL LOW (ref 3.5–5.1)
Sodium: 127 mmol/L — ABNORMAL LOW (ref 135–145)
Total Bilirubin: 0.7 mg/dL (ref 0.3–1.2)
Total Protein: 4.9 g/dL — ABNORMAL LOW (ref 6.5–8.1)

## 2021-01-15 LAB — BLOOD GAS, ARTERIAL
Acid-base deficit: 1.5 mmol/L (ref 0.0–2.0)
Bicarbonate: 21.4 mmol/L (ref 20.0–28.0)
FIO2: 21
O2 Saturation: 99.2 %
Patient temperature: 37
pCO2 arterial: 27.9 mmHg — ABNORMAL LOW (ref 32.0–48.0)
pH, Arterial: 7.497 — ABNORMAL HIGH (ref 7.350–7.450)
pO2, Arterial: 118 mmHg — ABNORMAL HIGH (ref 83.0–108.0)

## 2021-01-15 LAB — CBC
HCT: 19.8 % — ABNORMAL LOW (ref 36.0–46.0)
Hemoglobin: 7.1 g/dL — ABNORMAL LOW (ref 12.0–15.0)
MCH: 35.3 pg — ABNORMAL HIGH (ref 26.0–34.0)
MCHC: 35.9 g/dL (ref 30.0–36.0)
MCV: 98.5 fL (ref 80.0–100.0)
Platelets: 239 10*3/uL (ref 150–400)
RBC: 2.01 MIL/uL — ABNORMAL LOW (ref 3.87–5.11)
RDW: 16.5 % — ABNORMAL HIGH (ref 11.5–15.5)
WBC: 16 10*3/uL — ABNORMAL HIGH (ref 4.0–10.5)
nRBC: 0 % (ref 0.0–0.2)

## 2021-01-15 LAB — SURGICAL PATHOLOGY

## 2021-01-15 LAB — PREGNANCY, URINE: Preg Test, Ur: NEGATIVE

## 2021-01-15 NOTE — Progress Notes (Signed)
Nutrition Follow Up  DOCUMENTATION CODES:   Not applicable  INTERVENTION:    Ensure Enlive po BID, each supplement provides 350 kcal and 20 grams of protein  Magic cup TID with meals, each supplement provides 290 kcal and 9 grams of protein  MVI daily  NUTRITION DIAGNOSIS:   Increased nutrient needs related to catabolic illness (COVID) as evidenced by estimated needs.  Ongoing  GOAL:   Patient will meet greater than or equal to 90% of their needs   Meeting   MONITOR:   PO intake,Supplement acceptance,Skin,Weight trends,Labs,I & O's  REASON FOR ASSESSMENT:   Consult Assessment of nutrition requirement/status  ASSESSMENT:   45 y.o. female with medical history significant of alcohol abuse, tobacco use, history of cervical cancer, atopic dermatitis presents with weakness. Pt with  sepsis and acute hypoxic respiratory failure secondary to COVID-19 infection with superimposed bacterial infection. Pt with right moderate pleural effusion Pleural fluid culture grew Streptococcus intermedius.  Patient underwent ultrasound-guided thoracentesis on 1/18 and thoracostomy under CT guidance by IR on 1/21. Right chest tube draining mucopurulent debris. Pt for aevaluation by CT surgery for more definitive treatment of multiloculated effusion.   1/27- VATS, drainage of hemothorax/empyema   Appetite stable. Last two meal completions charted as 100%. Ensure intake looks to be off/on. Add Magic Cup to maximize kcal and protein.   UOP: 1025 ml x 24 hrs  Chest tubes: 250 ml x 24 hrs   Drips: NS @ 75 ml/hr  Medications: dulcolax, decadron, folic acid, senokot, thiamine  Labs: Na 127 (L) K 3.4 (L) CBG 76-125  Diet Order:   Diet Order            Diet regular Room service appropriate? Yes; Fluid consistency: Thin  Diet effective now                 EDUCATION NEEDS:   Not appropriate for education at this time  Skin:  Skin Assessment: Skin Integrity Issues: Skin Integrity  Issues:: Incisions Incisions: R chest  Last BM:  1/26  Height:   Ht Readings from Last 1 Encounters:  01/14/21 5' (1.524 m)    Weight:   Wt Readings from Last 1 Encounters:  01/14/21 58.7 kg   BMI:  Body mass index is 25.27 kg/m.  Estimated Nutritional Needs:   Kcal:  0093-8182  Protein:  85-95 grams  Fluid:  >/= 1.8 L/day  Mariana Single RD, LDN Clinical Nutrition Pager listed in [place] .

## 2021-01-15 NOTE — Progress Notes (Addendum)
      [address]       [place] [address]             [phone]      1 Day Post-Op Procedure(s) (LRB): VIDEO ASSISTED THORACOSCOPY (VATS)/DRAIN EMPYEMA (Right) DECORTICATION (Right)   Subjective:  Ms Carreras is doing okay.  She requests her PCA be stopped as it would just be easiest to take pills and her pain is controlled with them.  Patient also states she is starving  Objective: Vital signs in last 24 hours: Temp:  [97.1 F (36.2 C)-98.7 F (37.1 C)] 98.7 F (37.1 C) (01/28 0324) Pulse Rate:  [52-78] 68 (01/28 0324) Cardiac Rhythm: Normal sinus rhythm (01/28 0000) Resp:  [14-20] 16 (01/28 0336) BP: (83-132)/(43-66) 99/53 (01/28 0324) SpO2:  [98 %-100 %] 100 % (01/28 0336) Arterial Line BP: (104-140)/(49-90) 116/62 (01/28 0324) FiO2 (%):  [0 %] 0 % (01/27 1613) Weight:  [58.7 kg] 58.7 kg (01/27 1156)  Intake/Output from previous day: 01/27 0701 - 01/28 0700 In: 3135 [P.O.:360; I.V.:2125; IV Piggyback:650] Out: 1425 [Urine:1025; Blood:150; Chest Tube:250]  General appearance: alert, cooperative and no distress Heart: regular rate and rhythm Lungs: clear to auscultation bilaterally Abdomen: soft, non-tender; bowel sounds normal; no masses,  no organomegaly Extremities: extremities normal, atraumatic, no cyanosis or edema Wound: clean and dry  Lab Results: Recent Labs    01/14/21 0248 01/15/21 0346  WBC 14.1* 16.0*  HGB 7.9* 7.1*  HCT 22.5* 19.8*  PLT 257 239   BMET:  Recent Labs    01/14/21 0248 01/15/21 0346  NA 127* 127*  K 3.8 3.4*  CL 99 98  CO2 20* 19*  GLUCOSE 93 125*  BUN 7 8  CREATININE 0.51 0.54  CALCIUM 7.8* 7.2*    PT/INR: No results for input(s): LABPROT, INR in the last 72 hours. ABG    Component Value Date/Time   PHART 7.497 (H) 01/15/2021 0350   HCO3 21.4 01/15/2021 0350   TCO2 24 06/20/2015 0150   ACIDBASEDEF 1.5 01/15/2021 0350   O2SAT 99.2 01/15/2021 0350   CBG (last 3)  No results for input(s):  GLUCAP in the last 72 hours.  Assessment/Plan: S/P Procedure(s) (LRB): VIDEO ASSISTED THORACOSCOPY (VATS)/DRAIN EMPYEMA (Right) DECORTICATION (Right)  1. Empyema- on ABX for Strep Intermedius, repeat cultures obtained and sent in the OR yesterday 2. Pulm- not requiring oxygen, CT w/o air leak, 90 cc output.. CXR shows no pneumothorax this morning, will leave chest tube on suction today 3. Pain control- will d/c PCA per patient request, continue Toradol, oxycodone prn 4. DVT prophylaxis- ok to resume Lovenox from our standpoint 5. D/C Arterial line, d/c IV fluids 6. Dispo- patient doing well, CXR without pneumothorax today, leave chest tube to suction today, will transition to water seal tomorrow, care per primary   LOS: 6 days    Ellwood Handler, PA-C 01/15/2021 Patient seen and examined, agree with above Lovenox restarted CXR shows good reexpansion, no air leak CT to water seal in AM, hopefully can come out Sunday  Ingram Onnen C. Roxan Hockey, MD [facility] [phone]

## 2021-01-15 NOTE — Progress Notes (Signed)
PT Cancellation Note  Patient Details Name: MAKYNLI STILLS MRN: [id number] DOB: 28-Jun-1976   Cancelled Treatment:    Reason Eval/Treat Not Completed: Pain limiting ability to participate; attempted twice and pt initially fatigued from up to bathroom requesting PT to return in pm, then in too much pain second attempt.  Will attempt again another day.  [place] DHRCB:[phone] [phone] 01/15/2021  Reginia Naas 01/15/2021, 4:22 PM

## 2021-01-15 NOTE — Progress Notes (Signed)
[facility] PROGRESS NOTE   [name] [id number] DOB: 06/29/1976 DOA: 01/09/2021  PCP: Langston Reusing, NP  Brief History/Interval Summary: 45 y.o. female with medical history significant of alcohol abuse, tobacco use, history of cervical cancer, atopic dermatitis admitted to [facility] on 01/04/2021 for feeling weak for about 2 weeks.  She was treated for sepsis and acute hypoxic respiratory failure secondary to COVID-19 infection with superimposed bacterial infection.  She was treated with steroids and a 5-day course of remdesivir.  Work-up also revealed a right moderate pleural effusion.  Pleural fluid culture grew Streptococcus intermedius.  Patient underwent ultrasound-guided thoracentesis on 1/18 and thoracostomy under CT guidance by IR on 1/21.  Right chest tube draining mucopurulent debris.  ID was consulted and recommended antibiotics for at least 3 to 4 weeks.  She was sent to [facility] for evaluation by CT surgery for more definitive treatment of multiloculated effusion.    Reason for Visit: Empyema  Consultants: Cardiothoracic surgery  Procedures: Right chest tube placement, VATS  Antibiotics: Anti-infectives (From admission, onward)   Start     Dose/Rate Route Frequency Ordered Stop   01/14/21 0600  vancomycin (VANCOCIN) IVPB 1000 mg/200 mL premix        1,000 mg 200 mL/hr over 60 Minutes Intravenous On call to O.R. 01/13/21 1453 01/14/21 1145   01/09/21 2345  Ampicillin-Sulbactam (UNASYN) 3 g in sodium chloride 0.9 % 100 mL IVPB        3 g 200 mL/hr over 30 Minutes Intravenous Every 6 hours 01/09/21 2247        Subjective/Interval History: Postoperative pain yesterday into last night was somewhat poorly controlled, transition from fentanyl PCA pump to p.o. oxycodone, tramadol, ibuprofen with moderate control over the past few hours.  Patient continues to complain of pain at the site of surgery and chest tube insertion but otherwise denies fevers,  chills, nausea, vomiting, diarrhea, headache.   Assessment/Plan:  Right-sided empyema s/p pigtail catheter 1/21; VATS 1/27 Pleural fluid cultures grew Streptococcus intermedius. Patient underwent thoracentesis on 1/18 followed by a pigtail catheter placement under CT guidance by interventional radiology on 1/21. Cardiothoracic surgery is following.  VATS 01/14/21 Patient was also seen by infectious disease recommending antibiotics for 3 to 4 weeks. Currently on Unasyn with expected transition to Augmentin depending on clinical progress/chest tube removal removal. Continue probiotics Fentanyl PCA pump stopped overnight, patient moderately well controlled on OxyContin, tramadol, ibuprofen  Acute blood loss anemia on chronic anemia(unspecified subtype), multifactorial, ongoing Patient noted to have chronic anemia -unspecified.   Initial anemia 1/25 status post chest tube washout s/p 1u PRBC - stabilized at that time Postoperatively hemoglobin downtrending slightly to 7.1 on 28th, remains asymptomatic -scant serosanguineous fluid in the chest tube today  Pneumonia due to COVID-19 Completed 5-day course of Remdesivir. She has been weaned off of oxygen. Currently saturating normal on room air. Remains on dexamethasone 6 mg daily. Continue incentive spirometer. Based on x-ray findings from the time of admission and her symptoms it looks like her symptoms are primarily due to empyema.  She has a positive test result from 1/17.  Does not need more than 10 days of isolation. She to come off isolation on 1/28.  Hyponatremia/hypomagnesemia/hypokalemia, improving Likely hypovolemic vs SIADH from lung infection Sodium was 114 on 1/17.  Labs stabilizing now - likely at baseline (around 128-130 2 years ago)  History of alcohol abuse No evidence for withdrawal. Continue thiamine folate multivitamins. CIWA protocol likely unnecessary given outside of  window at this point.  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 Continues on midodrine.  Cortisol stimulation test shows mild improvement in cortisol (but not to expected levels) - concern for secondary adrenal insufficiency in the setting of infection.  Abnormal thyroid function tests TSH noted to be 7.9 with a free T4 which is normal at 0.85.  To be rechecked in the outpatient setting in a few weeks.    Positive hep C antibody/mild transaminitis Patient also with history of alcohol use.  HCV titer and genotype pending.   Ultrasound of the abdomen showed diffusely increased hepatic echogenicity compatible with hepatic steatosis.  Developing cirrhotic changes were also noted.  Cholelithiasis and biliary sludge was also noted.  No cholecystitis was noted. HIV screening was negative.   Will need outpatient consultation with ID as well as gastroenterology.  Moderate protein calorie malnutrition Encourage oral intake.  Tobacco abuse Nicotine patch.   DVT Prophylaxis: Lovenox Code Status: Full code Family Communication: Discussed with the patient. Disposition Plan: Hopefully return home when improved, will need CT surgery sign off as well.  Status is: Inpatient  Remains inpatient appropriate because:IV treatments appropriate due to intensity of illness or inability to take PO and Inpatient level of care appropriate due to severity of illness   Dispo: The patient is from: Home              Anticipated d/c is to: Home              Anticipated d/c date is: 3 days              Patient currently is not medically stable to d/c.   Difficult to place patient No    Medications:  Scheduled: . sodium chloride   Intravenous Once  . bisacodyl  10 mg Oral Daily  . Chlorhexidine Gluconate Cloth  6 each Topical Q0600  . dexamethasone (DECADRON) injection  6 mg Intravenous Q24H  . enoxaparin (LOVENOX) injection  40 mg Subcutaneous Q24H  . feeding supplement  237 mL Oral BID BM  . folic acid  1 mg Oral Daily  .  ketorolac  15 mg Intravenous Q6H  . midodrine  5 mg Oral BID WC  . multivitamin with minerals  1 tablet Oral Daily  . mupirocin ointment  1 application Nasal BID  . nicotine  14 mg Transdermal Daily  . pantoprazole  40 mg Oral Q1200  . saccharomyces boulardii  250 mg Oral BID  . senna-docusate  1 tablet Oral QHS  . thiamine  100 mg Oral Daily   Continuous: . sodium chloride 75 mL/hr at 01/15/21 0339  . ampicillin-sulbactam (UNASYN) IV 3 g (01/15/21 0559)   [id number] & mag hydroxide-simeth, calcium carbonate, diphenhydrAMINE, guaiFENesin-dextromethorphan, ibuprofen, loperamide, methocarbamol, ondansetron (ZOFRAN) IV, oxyCODONE, traMADol   Objective:  Vital Signs  Vitals:   01/14/21 2356 01/15/21 0000 01/15/21 0324 01/15/21 0336  BP:  (!) 92/53 (!) 99/53   Pulse:  62 68   Resp: 20 20 16 16   Temp:  98.5 F (36.9 C) 98.7 F (37.1 C)   TempSrc:  Oral Oral   SpO2: 99% 100% 99% 100%  Weight:      Height:        Intake/Output Summary (Last 24 hours) at 01/15/2021 0753 Last data filed at 01/15/2021 [id number] Gross per 24 hour  Intake 3135 ml  Output 1425 ml  Net 1710 ml   Filed Weights   01/14/21 1156  Weight: 58.7 kg  General appearance: Awake alert.  In no distress Resp: Chest tube on the right - draining serosanguinous fluid Cardio: S1-S2 is normal regular.  No S3-S4.  No rubs murmurs or bruit GI: Abdomen is soft.  Nontender nondistended.  Bowel sounds are present normal.  No masses organomegaly Extremities: No edema.  Full range of motion of lower extremities. Neurologic: Alert and oriented x3.  No focal neurological deficits.   Lab Results:  Data Reviewed: I have personally reviewed following labs and imaging studies  CBC: Recent Labs  Lab 01/09/21 0707 01/10/21 0127 01/11/21 0700 01/12/21 0244 01/12/21 1004 01/13/21 0829 01/14/21 0248 01/15/21 0346  WBC 7.5   < > 9.3 12.3*  --  14.5* 14.1* 16.0*  NEUTROABS 5.1  --   --   --   --   --   --   --   HGB 7.5*    < > 8.6* 6.0* 8.9* 9.0* 7.9* 7.1*  HCT 21.3*   < > 25.2* 17.5* 25.3* 25.1* 22.5* 19.8*  MCV 93.8   < > 98.1 98.3  --  98.8 98.3 98.5  PLT 167   < > 239 206  --  278 257 239   < > = values in this interval not displayed.    Basic Metabolic Panel: Recent Labs  Lab 01/09/21 0707 01/10/21 0127 01/11/21 0700 01/12/21 0244 01/13/21 0829 01/14/21 0248 01/15/21 0346  NA 130* 129* 129* 127* 129* 127* 127*  K 3.9 3.6 3.7 3.2* 4.2 3.8 3.4*  CL 96* 96* 97* 95* 99 99 98  CO2 25 22 24 22  21* 20* 19*  GLUCOSE 133* 141* 87 104* 76 93 125*  BUN 10 11 10 9 7 7 8   CREATININE 0.55 0.68 0.61 0.49 0.57 0.51 0.54  CALCIUM 7.2* 7.3* 7.8* 7.7* 8.2* 7.8* 7.2*  MG 1.6* 1.4* 1.7  --  1.5*  --   --   PHOS  --  3.2  --   --   --   --   --     GFR: Estimated Creatinine Clearance: 70.5 mL/min (by C-G formula based on SCr of 0.54 mg/dL).  Liver Function Tests: Recent Labs  Lab 01/11/21 0700 01/12/21 0244 01/13/21 0829 01/14/21 0248 01/15/21 0346  AST 111* 85* 146* 92* 65*  ALT 51* 48* 73* 61* 51*  ALKPHOS 63 50 58 50 46  BILITOT 1.0 0.9 1.4* 1.0 0.7  PROT 6.0* 5.1* 6.1* 5.5* 4.9*  ALBUMIN 2.0* 1.9* 2.3* 2.1* 2.1*      CBG: No results for input(s): GLUCAP in the last 168 hours.     Recent Results (from the past 240 hour(s))  Body fluid culture     Status: None   Collection Time: 01/05/21  3:55 PM   Specimen: PATH Cytology Pleural fluid  Result Value Ref Range Status   Specimen Description   Final    PLEURAL Performed at [facility], [address], [place], [place] [address]    Special Requests   Final    PLEURAL Performed at [facility], [address] [place], [place] [address]    Gram Stain   Final    ABUNDANT WBC PRESENT, PREDOMINANTLY PMN NO ORGANISMS SEEN    Culture   Final    MODERATE STREPTOCOCCUS INTERMEDIUS CRITICAL RESULT CALLED TO, READ BACK BY AND VERIFIED WITH: DR Delaine Lame @1118  01/07/21 EB Performed at [facility], [place] [address] [place], [place] [address]    Report Status 01/08/2021 FINAL  Final  Organism ID, Bacteria STREPTOCOCCUS INTERMEDIUS  Final      Susceptibility   Streptococcus intermedius - MIC*    PENICILLIN <=0.06 SENSITIVE Sensitive     CEFTRIAXONE 0.25 SENSITIVE Sensitive     ERYTHROMYCIN >=8 RESISTANT Resistant     LEVOFLOXACIN 0.5 SENSITIVE Sensitive     VANCOMYCIN 0.25 SENSITIVE Sensitive     * MODERATE STREPTOCOCCUS INTERMEDIUS  Fungus Culture With Stain     Status: None (Preliminary result)   Collection Time: 01/05/21  3:55 PM   Specimen: PATH Cytology Pleural fluid  Result Value Ref Range Status   Fungus Stain Final report  Final    Comment: (NOTE) Performed At: [facility] [place], [place] [id number] Rush Farmer MD [id number]    Fungus (Mycology) Culture PENDING  Incomplete   Fungal Source PLEURAL  Final    Comment: Performed at [facility], [address] [place], [place] [address]  Acid Fast Smear (AFB)     Status: None   Collection Time: 01/05/21  3:55 PM   Specimen: PATH Cytology Pleural fluid  Result Value Ref Range Status   AFB Specimen Processing Concentration  Final   Acid Fast Smear Negative  Final    Comment: (NOTE) Performed At: [facility] [place], [place] [id number] Rush Farmer MD [id number]    Source (AFB) PLEURAL  Final    Comment: Performed at [facility], [address] [place], [place] [address]  Fungus Culture Result     Status: None   Collection Time: 01/05/21  3:55 PM  Result Value Ref Range Status   Result 1 Comment  Final    Comment: (NOTE) KOH/Calcofluor preparation:  no fungus observed. Performed At: [facility] [place], [place] [id number] Rush Farmer MD [id number]   MRSA PCR Screening     Status: Abnormal   Collection Time: 01/13/21  8:57 PM   Specimen: Nasal Mucosa; Nasopharyngeal  Result Value Ref Range Status   MRSA by PCR  POSITIVE (A) NEGATIVE Final    Comment:        The GeneXpert MRSA Assay (FDA approved for NASAL specimens only), is one component of a comprehensive MRSA colonization surveillance program. It is not intended to diagnose MRSA infection nor to guide or monitor treatment for MRSA infections. RESULT CALLED TO, READ BACK BY AND VERIFIED WITH: Gearldine Shown RN 01/14/21 0211 JDW Performed at [facility], [place] [address], [place], [place] [address]   Aerobic/Anaerobic Culture (surgical/deep wound)     Status: None (Preliminary result)   Collection Time: 01/14/21 10:57 AM   Specimen: Pleural, Right; Body Fluid  Result Value Ref Range Status   Specimen Description TISSUE  Final   Special Requests RIGHT PLEURAL PEEL  Final   Gram Stain   Final    ABUNDANT WBC PRESENT, PREDOMINANTLY PMN RARE GRAM POSITIVE COCCI Performed at [facility], [address], [place], [place] [address]    Culture PENDING  Incomplete   Report Status PENDING  Incomplete  Aerobic/Anaerobic Culture (surgical/deep wound)     Status: None (Preliminary result)   Collection Time: 01/14/21 11:08 AM   Specimen: PATH Other; Tissue  Result Value Ref Range Status   Specimen Description PLEURAL  Final   Special Requests RIGHT SIDE A  Final   Gram Stain   Final    ABUNDANT WBC PRESENT, PREDOMINANTLY PMN NO ORGANISMS SEEN Performed at [facility], [place] [address], [place], [place]  [address]    Culture PENDING  Incomplete   Report Status PENDING  Incomplete      Radiology Studies: DG CHEST PORT 1 VIEW  Result Date: 01/15/2021 CLINICAL DATA:  45 year old female with history of empyema, chest tube. EXAM: PORTABLE CHEST 1 VIEW COMPARISON:  Portable chest 01/14/2021 and earlier. FINDINGS: Portable AP upright view at 0515 hours. 2 right chest tubes now in place and stable since 1531 hours yesterday. Small volume right chest wall and neck subcutaneous gas has increased. No definite pneumothorax. Streaky right  lung base opacity is stable. Mediastinal contours remain within normal limits. Left lung remains clear when allowing for portable technique. Paucity of bowel gas in the upper abdomen. No acute osseous abnormality identified. IMPRESSION: 1. Stable right chest tubes with no definite pneumothorax. Improved right lung base ventilation from yesterday morning with residual patchy opacity. 2. Left lung remains clear. Electronically Signed   By: Genevie Ann M.D.   On: 01/15/2021 06:10   DG Chest Port 1 View  Result Date: 01/14/2021 CLINICAL DATA:  Recent empyema EXAM: PORTABLE CHEST 1 VIEW COMPARISON:  January 14, 2021 study obtained earlier in the day FINDINGS: Pigtail catheter has been removed from the right side with 2 chest tubes now present on the right. There is trace right basilar pneumothorax currently. Atelectatic change is noted in the right base. Lungs elsewhere clear. Heart size and pulmonary vascularity are normal. No adenopathy. No bone lesions. IMPRESSION: Chest tubes present on the right with minimal right base pneumothorax. Other components of pneumothorax have resolved. There is right base atelectasis. No edema or consolidation. Heart size normal. Electronically Signed   By: Lowella Grip III M.D.   On: 01/14/2021 15:55   DG Chest Port 1 View  Result Date: 01/14/2021 CLINICAL DATA:  45 year old female with history of empyema, chest tube placed. EXAM: PORTABLE CHEST 1 VIEW COMPARISON:  Portable chest 01/13/2021 and earlier. FINDINGS: Portable AP upright view at 0518 hours. Stable pigtail right chest tube near the lung base. Persistent although smaller residual right side hydropneumothorax. Less pleural air now. Patchy and confluent opacity persists at the right costophrenic angle. Stable cardiac size and mediastinal contours. Stable, negative left lung. Visualized tracheal air column is within normal limits. No acute osseous abnormality identified. Negative visible bowel gas pattern. IMPRESSION: 1.  Decreased right hydropneumothorax since yesterday, small to moderate residual. Stable right chest tube. 2. No new cardiopulmonary abnormality. Electronically Signed   By: Genevie Ann M.D.   On: 01/14/2021 06:24       LOS: 6 days   [facility] Pager on [email or web address]  01/15/2021, 7:53 AM

## 2021-01-16 ENCOUNTER — Inpatient Hospital Stay (HOSPITAL_COMMUNITY): Payer: Self-pay

## 2021-01-16 DIAGNOSIS — J869 Pyothorax without fistula: Secondary | ICD-10-CM | POA: Diagnosis not present

## 2021-01-16 DIAGNOSIS — U071 COVID-19: Secondary | ICD-10-CM | POA: Diagnosis not present

## 2021-01-16 LAB — COMPREHENSIVE METABOLIC PANEL
ALT: 47 U/L — ABNORMAL HIGH (ref 0–44)
AST: 59 U/L — ABNORMAL HIGH (ref 15–41)
Albumin: 2 g/dL — ABNORMAL LOW (ref 3.5–5.0)
Alkaline Phosphatase: 44 U/L (ref 38–126)
Anion gap: 6 (ref 5–15)
BUN: 6 mg/dL (ref 6–20)
CO2: 21 mmol/L — ABNORMAL LOW (ref 22–32)
Calcium: 7.6 mg/dL — ABNORMAL LOW (ref 8.9–10.3)
Chloride: 100 mmol/L (ref 98–111)
Creatinine, Ser: 0.54 mg/dL (ref 0.44–1.00)
GFR, Estimated: >60 mL/min (ref >60)
Glucose, Bld: 84 mg/dL (ref 70–99)
Potassium: 3.3 mmol/L — ABNORMAL LOW (ref 3.5–5.1)
Sodium: 127 mmol/L — ABNORMAL LOW (ref 135–145)
Total Bilirubin: 0.6 mg/dL (ref 0.3–1.2)
Total Protein: 5 g/dL — ABNORMAL LOW (ref 6.5–8.1)

## 2021-01-16 LAB — CBC
HCT: 20.9 % — ABNORMAL LOW (ref 36.0–46.0)
Hemoglobin: 7.1 g/dL — ABNORMAL LOW (ref 12.0–15.0)
MCH: 34.6 pg — ABNORMAL HIGH (ref 26.0–34.0)
MCHC: 34 g/dL (ref 30.0–36.0)
MCV: 102 fL — ABNORMAL HIGH (ref 80.0–100.0)
Platelets: 236 10*3/uL (ref 150–400)
RBC: 2.05 MIL/uL — ABNORMAL LOW (ref 3.87–5.11)
RDW: 17.2 % — ABNORMAL HIGH (ref 11.5–15.5)
WBC: 12.6 10*3/uL — ABNORMAL HIGH (ref 4.0–10.5)
nRBC: 0 % (ref 0.0–0.2)

## 2021-01-16 MED ORDER — POTASSIUM CHLORIDE CRYS ER 10 MEQ PO TBCR
40.0000 meq | EXTENDED_RELEASE_TABLET | Freq: Once | ORAL | Status: AC
  Administered 2021-01-16: 40 meq via ORAL
  Filled 2021-01-16: qty 4

## 2021-01-16 NOTE — Progress Notes (Signed)
Physical Therapy Treatment Patient Details Name: [name] MRN: [id number] DOB: 1976/10/28 Today's Date: 01/16/2021    History of Present Illness 45 y.o. female with medical history significant of alcohol abuse, tobacco use, history of cervical cancer, atopic dermatitis admitted to [facility] on 01/04/2021 for feeling weak for about 2 weeks.  She was treated for sepsis and acute hypoxic respiratory failure secondary to COVID-19 infection with superimposed bacterial infection. [place] ultrasound-guided thoracentesis on 1/18 and thoracostomy under CT guidance by IR on 1/21. Underwent VATS procedure 01/14/21. Transitioned to water seal chest-tube Rt 1/29.    PT Comments    Patient is progressing well towards physical therapy goals, ambulating up to 150 feet today with supervision while lightly using rolling walker. VSS throughout session. Mild fatigue reported with distance covered. Progressing nicely and eager to continue improvement with functional independence. Patient will continue to benefit from skilled physical therapy services to further improve independence with functional mobility.        Follow Up Recommendations  No PT follow up     Equipment Recommendations  None recommended by PT    Recommendations for Other Services       Precautions / Restrictions Precautions Precautions: Other (comment) Precaution Comments: Chest tube on the Right Restrictions Weight Bearing Restrictions: No    Mobility  Bed Mobility Overal bed mobility: Independent                Transfers Overall transfer level: Needs assistance Equipment used: Rolling walker (2 wheeled) Transfers: Sit to/from Stand Sit to Stand: Supervision         General transfer comment: Cues for chest tube management, assisted with lines/leads.  Ambulation/Gait Ambulation/Gait assistance: Supervision Gait Distance (Feet): 150 Feet Assistive device: Rolling walker (2 wheeled) Gait Pattern/deviations:  Step-through pattern Gait velocity: decreased   General Gait Details: Tolerated well, ambulating 150 feet today with several turns, cues to remain within RW. No buckling or LOB noted with this device. One standing rest break for distance covered. SpO2 100% on room air. VSS throughout.   Stairs             Wheelchair Mobility    Modified Rankin (Stroke Patients Only)       Balance Overall balance assessment: No apparent balance deficits (not formally assessed)                                          Cognition Arousal/Alertness: Awake/alert Behavior During Therapy: WFL for tasks assessed/performed Overall Cognitive Status: Within Functional Limits for tasks assessed                                        Exercises General Exercises - Lower Extremity Ankle Circles/Pumps: AROM;Both;10 reps;Seated Long Arc Quad: Strengthening;Both;10 reps;Seated    General Comments General comments (skin integrity, edema, etc.): VSS      Pertinent Vitals/Pain Pain Assessment: Faces Faces Pain Scale: Hurts little more Pain Location: R chest wall at Chest tube insertion Pain Descriptors / Indicators: Tender Pain Intervention(s): Limited activity within patient's tolerance;Monitored during session;Repositioned    Home Living Family/patient expects to be discharged to:: Private residence Living Arrangements: Other (Comment) (roommate and adult daughter) Available Help at Discharge: Friend(s);Available PRN/intermittently Type of Home: House Home Access: Stairs to enter Entrance Stairs-Rails: Left (unreliable) Home Layout: One  level Home Equipment: None      Prior Function Level of Independence: Independent      Comments: Pt reports previous falls "there was a lot of blood. the police came once. I don't remember falling or the cause."   PT Goals (current goals can now be found in the care plan section) Acute Rehab PT Goals Patient Stated Goal:  get better PT Goal Formulation: With patient Time For Goal Achievement: 01/24/21 Potential to Achieve Goals: Good Progress towards PT goals: Progressing toward goals    Frequency    Min 3X/week      PT Plan Current plan remains appropriate    Co-evaluation              AM-PAC PT "6 Clicks" Mobility   Outcome Measure  Help needed turning from your back to your side while in a flat bed without using bedrails?: None Help needed moving from lying on your back to sitting on the side of a flat bed without using bedrails?: None Help needed moving to and from a bed to a chair (including a wheelchair)?: None Help needed standing up from a chair using your arms (e.g., wheelchair or bedside chair)?: A Little Help needed to walk in hospital room?: A Little Help needed climbing 3-5 steps with a railing? : A Little 6 Click Score: 21    End of Session Equipment Utilized During Treatment: Gait belt Activity Tolerance: Patient tolerated treatment well Patient left: in bed;with call bell/phone within reach   PT Visit Diagnosis: Other abnormalities of gait and mobility (R26.89)     Time: 0370-4888 PT Time Calculation (min) (ACUTE ONLY): 25 min  Charges:  $Gait Training: 8-22 mins $Therapeutic Activity: 8-22 mins                     Ellouise Newer, PT, DPT   Ellouise Newer 01/16/2021, 2:37 PM

## 2021-01-16 NOTE — Progress Notes (Addendum)
      [address]       ,[place] [address]             [phone]      2 Days Post-Op Procedure(s) (LRB): VIDEO ASSISTED THORACOSCOPY (VATS)/DRAIN EMPYEMA (Right) DECORTICATION (Right)   Subjective:  No new complaints.  Continues to have incisional/chest tube discomfort.  Objective: Vital signs in last 24 hours: Temp:  [98.1 F (36.7 C)-98.9 F (37.2 C)] 98.1 F (36.7 C) (01/29 0911) Pulse Rate:  [57-90] 90 (01/29 0911) Cardiac Rhythm: Normal sinus rhythm (01/29 0843) Resp:  [13-21] 20 (01/29 0911) BP: (101-132)/(63-77) 101/63 (01/29 0911) SpO2:  [99 %-100 %] 99 % (01/29 0911)  Intake/Output from previous day: 01/28 0701 - 01/29 0700 In: -  Out: 50 [Chest Tube:50]  General appearance: alert, cooperative and no distress Heart: regular rate and rhythm Lungs: clear to auscultation bilaterally Wound: clean and dry  Lab Results: Recent Labs    01/15/21 0346 01/16/21 0152  WBC 16.0* 12.6*  HGB 7.1* 7.1*  HCT 19.8* 20.9*  PLT 239 236   BMET:  Recent Labs    01/15/21 0346 01/16/21 0152  NA 127* 127*  K 3.4* 3.3*  CL 98 100  CO2 19* 21*  GLUCOSE 125* 84  BUN 8 6  CREATININE 0.54 0.54  CALCIUM 7.2* 7.6*    PT/INR: No results for input(s): LABPROT, INR in the last 72 hours. ABG    Component Value Date/Time   PHART 7.497 (H) 01/15/2021 0350   HCO3 21.4 01/15/2021 0350   TCO2 24 06/20/2015 0150   ACIDBASEDEF 1.5 01/15/2021 0350   O2SAT 99.2 01/15/2021 0350   CBG (last 3)  No results for input(s): GLUCAP in the last 72 hours.  Assessment/Plan: S/P Procedure(s) (LRB): VIDEO ASSISTED THORACOSCOPY (VATS)/DRAIN EMPYEMA (Right) DECORTICATION (Right)  1. Empyema- continue ABX per primary, OR cultures are negative to date 2. Pulm- CT no air leak present, 200 cc output (level at 500)- will transition to water seal today, CXR is free from pneumothorax 3. Dispo- patient stable, chest tube to water seal, repeat CXR in AM, care per  medicine   LOS: 7 days    Ellwood Handler, PA-C 01/16/2021   Chart reviewed, patient examined, agree with above. CXR stable and no air leak. Water seal today and may be able to remove one of tubes tomorrow.

## 2021-01-16 NOTE — Progress Notes (Signed)
[facility] PROGRESS NOTE   [name] [id number] DOB: Jun 12, 1976 DOA: 01/09/2021  PCP: Langston Reusing, NP  Brief History/Interval Summary: 45 y.o. female with medical history significant of alcohol abuse, tobacco use, history of cervical cancer, atopic dermatitis admitted to [facility] on 01/04/2021 for feeling weak for about 2 weeks.  She was treated for sepsis and acute hypoxic respiratory failure secondary to COVID-19 infection with superimposed bacterial infection.  She was treated with steroids and a 5-day course of remdesivir.  Work-up also revealed a right moderate pleural effusion.  Pleural fluid culture grew Streptococcus intermedius.  Patient underwent ultrasound-guided thoracentesis on 1/18 and thoracostomy under CT guidance by IR on 1/21.  Right chest tube draining mucopurulent debris.  ID was consulted and recommended antibiotics for at least 3 to 4 weeks.  She was sent to [facility] for evaluation by CT surgery for more definitive treatment of multiloculated effusion.    Reason for Visit: Empyema  Consultants: Cardiothoracic surgery  Procedures: Right chest tube placement, VATS  Antibiotics: Anti-infectives (From admission, onward)   Start     Dose/Rate Route Frequency Ordered Stop   01/14/21 0600  vancomycin (VANCOCIN) IVPB 1000 mg/200 mL premix        1,000 mg 200 mL/hr over 60 Minutes Intravenous On call to O.R. 01/13/21 1453 01/14/21 1145   01/09/21 2345  Ampicillin-Sulbactam (UNASYN) 3 g in sodium chloride 0.9 % 100 mL IVPB        3 g 200 mL/hr over 30 Minutes Intravenous Every 6 hours 01/09/21 2247        Subjective/Interval History: Patient continues to complain of pain at the site of surgery and chest tube insertion but moderately improving today; otherwise denies fevers, chills, nausea, vomiting, diarrhea, headache.   Assessment/Plan:  Right-sided empyema s/p pigtail catheter 1/21; VATS 1/27 Pleural fluid cultures grew Streptococcus  intermedius. Patient underwent thoracentesis on 1/18 followed by a pigtail catheter placement under CT guidance by interventional radiology on 1/21. Cardiothoracic surgery is following.  VATS 01/14/21 Patient was also seen by infectious disease recommending antibiotics for 3 to 4 weeks. Currently on Unasyn with expected transition to Augmentin depending on clinical progress/chest tube removal removal. Continue probiotics Fentanyl PCA pump stopped overnight, patient moderately well controlled on OxyContin, tramadol, ibuprofen  Acute blood loss anemia on chronic anemia(unspecified subtype), multifactorial, ongoing Patient noted to have chronic anemia -unspecified.   Initial anemia 1/25 status post chest tube washout s/p 1u PRBC - stabilized at that time Postoperatively hemoglobin downtrending slightly to 7.1 on 28th, remains asymptomatic -scant serosanguineous fluid in the chest tube today  Pneumonia due to COVID-19 Completed 5-day course of Remdesivir. She has been weaned off of oxygen. Currently saturating normal on room air. Remains on dexamethasone 6 mg daily. Continue incentive spirometer. Based on x-ray findings from the time of admission and her symptoms it looks like her symptoms are primarily due to empyema.  She has a positive test result from 1/17.  Does not need more than 10 days of isolation. She is off isolation as of 1/28.  Hyponatremia/hypomagnesemia/hypokalemia, improving Likely hypovolemic vs SIADH from lung infection Sodium was 114 on 1/17.  Labs stabilizing now - likely at baseline (around 128-130 2 years ago)  History of alcohol abuse No evidence for withdrawal. Continue thiamine folate multivitamins. CIWA protocol likely unnecessary given outside of window at this point.  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 Continues on midodrine.  Cortisol stimulation test shows mild improvement in cortisol (but not  to expected levels) - concern for secondary  adrenal insufficiency in the setting of infection.  Abnormal thyroid function tests TSH noted to be 7.9 with a free T4 which is normal at 0.85.  To be rechecked in the outpatient setting in a few weeks.    Positive hep C antibody/mild transaminitis Patient also with history of alcohol use.  HCV titer and genotype pending.   Ultrasound of the abdomen showed diffusely increased hepatic echogenicity compatible with hepatic steatosis.  Developing cirrhotic changes were also noted.  Cholelithiasis and biliary sludge was also noted.  No cholecystitis was noted. HIV screening was negative.   Will need outpatient consultation with ID as well as gastroenterology.  Moderate protein calorie malnutrition Encourage oral intake.  Tobacco abuse Nicotine patch.   DVT Prophylaxis: Lovenox Code Status: Full code Family Communication: Discussed with the patient. Disposition Plan: Hopefully return home when improved, will need CT surgery sign off as well.  Status is: Inpatient  Remains inpatient appropriate because:IV treatments appropriate due to intensity of illness or inability to take PO and Inpatient level of care appropriate due to severity of illness   Dispo: The patient is from: Home              Anticipated d/c is to: Home              Anticipated d/c date is: 3 days              Patient currently is not medically stable to d/c.   Difficult to place patient No    Medications:  Scheduled: . sodium chloride   Intravenous Once  . bisacodyl  10 mg Oral Daily  . Chlorhexidine Gluconate Cloth  6 each Topical Q0600  . dexamethasone (DECADRON) injection  6 mg Intravenous Q24H  . enoxaparin (LOVENOX) injection  40 mg Subcutaneous Q24H  . feeding supplement  237 mL Oral BID BM  . folic acid  1 mg Oral Daily  . ketorolac  15 mg Intravenous Q6H  . midodrine  5 mg Oral BID WC  . multivitamin with minerals  1 tablet Oral Daily  . mupirocin ointment  1 application Nasal BID  . nicotine  14 mg  Transdermal Daily  . pantoprazole  40 mg Oral Q1200  . saccharomyces boulardii  250 mg Oral BID  . senna-docusate  1 tablet Oral QHS  . thiamine  100 mg Oral Daily   Continuous: . sodium chloride Stopped (01/15/21 1420)  . ampicillin-sulbactam (UNASYN) IV 3 g (01/16/21 3295)   JOA:CZYS & mag hydroxide-simeth, calcium carbonate, diphenhydrAMINE, guaiFENesin-dextromethorphan, ibuprofen, loperamide, methocarbamol, ondansetron (ZOFRAN) IV, oxyCODONE, traMADol   Objective:  Vital Signs  Vitals:   01/15/21 1802 01/15/21 2000 01/16/21 0000 01/16/21 0400  BP: 132/77 107/66 102/67 115/68  Pulse: (!) 57 64 60 60  Resp: 13 20 18 20   Temp: 98.8 F (37.1 C) 98.4 F (36.9 C)  98.9 F (37.2 C)  TempSrc: Oral Oral  Oral  SpO2: 100% 100% 99% 100%  Weight:      Height:        Intake/Output Summary (Last 24 hours) at 01/16/2021 0750 Last data filed at 01/16/2021 0611 Gross per 24 hour  Intake -  Output 50 ml  Net -50 ml   Filed Weights   01/14/21 1156  Weight: 58.7 kg    General appearance: Awake alert.  In no distress Resp: Chest tube on the right - draining serosanguinous fluid Cardio: S1-S2 is normal regular.  No S3-S4.  No rubs murmurs or bruit GI: Abdomen is soft.  Nontender nondistended.  Bowel sounds are present normal.  No masses organomegaly Extremities: No edema.  Full range of motion of lower extremities. Neurologic: Alert and oriented x3.  No focal neurological deficits.   Lab Results:  Data Reviewed: I have personally reviewed following labs and imaging studies  CBC: Recent Labs  Lab 01/12/21 0244 01/12/21 1004 01/13/21 0829 01/14/21 0248 01/15/21 0346 01/16/21 0152  WBC 12.3*  --  14.5* 14.1* 16.0* 12.6*  HGB 6.0* 8.9* 9.0* 7.9* 7.1* 7.1*  HCT 17.5* 25.3* 25.1* 22.5* 19.8* 20.9*  MCV 98.3  --  98.8 98.3 98.5 102.0*  PLT 206  --  278 257 239 353    Basic Metabolic Panel: Recent Labs  Lab 01/10/21 0127 01/11/21 0700 01/12/21 0244 01/13/21 0829  01/14/21 0248 01/15/21 0346 01/16/21 0152  NA 129* 129* 127* 129* 127* 127* 127*  K 3.6 3.7 3.2* 4.2 3.8 3.4* 3.3*  CL 96* 97* 95* 99 99 98 100  CO2 22 24 22  21* 20* 19* 21*  GLUCOSE 141* 87 104* 76 93 125* 84  BUN 11 10 9 7 7 8 6   CREATININE 0.68 0.61 0.49 0.57 0.51 0.54 0.54  CALCIUM 7.3* 7.8* 7.7* 8.2* 7.8* 7.2* 7.6*  MG 1.4* 1.7  --  1.5*  --   --   --   PHOS 3.2  --   --   --   --   --   --     GFR: Estimated Creatinine Clearance: 70.5 mL/min (by C-G formula based on SCr of 0.54 mg/dL).  Liver Function Tests: Recent Labs  Lab 01/12/21 0244 01/13/21 0829 01/14/21 0248 01/15/21 0346 01/16/21 0152  AST 85* 146* 92* 65* 59*  ALT 48* 73* 61* 51* 47*  ALKPHOS 50 58 50 46 44  BILITOT 0.9 1.4* 1.0 0.7 0.6  PROT 5.1* 6.1* 5.5* 4.9* 5.0*  ALBUMIN 1.9* 2.3* 2.1* 2.1* 2.0*      CBG: No results for input(s): GLUCAP in the last 168 hours.     Recent Results (from the past 240 hour(s))  MRSA PCR Screening     Status: Abnormal   Collection Time: 01/13/21  8:57 PM   Specimen: Nasal Mucosa; Nasopharyngeal  Result Value Ref Range Status   MRSA by PCR POSITIVE (A) NEGATIVE Final    Comment:        The GeneXpert MRSA Assay (FDA approved for NASAL specimens only), is one component of a comprehensive MRSA colonization surveillance program. It is not intended to diagnose MRSA infection nor to guide or monitor treatment for MRSA infections. RESULT CALLED TO, READ BACK BY AND VERIFIED WITH: Gearldine Shown RN 01/14/21 0211 JDW Performed at [facility], [place] [address] [place], [place] [address]   Aerobic/Anaerobic Culture (surgical/deep wound)     Status: None (Preliminary result)   Collection Time: 01/14/21 10:57 AM   Specimen: Pleural, Right; Body Fluid  Result Value Ref Range Status   Specimen Description TISSUE  Final   Special Requests RIGHT PLEURAL PEEL  Final   Gram Stain   Final    ABUNDANT WBC PRESENT, PREDOMINANTLY PMN RARE GRAM POSITIVE COCCI    Culture    Final    CULTURE REINCUBATED FOR BETTER GROWTH Performed at [facility], [place] [address], [place], [place] [address]    Report Status PENDING  Incomplete  Aerobic/Anaerobic Culture (surgical/deep wound)     Status: None (Preliminary result)   Collection  Time: 01/14/21 11:08 AM   Specimen: PATH Other; Tissue  Result Value Ref Range Status   Specimen Description PLEURAL  Final   Special Requests RIGHT SIDE A  Final   Gram Stain   Final    ABUNDANT WBC PRESENT, PREDOMINANTLY PMN NO ORGANISMS SEEN    Culture   Final    NO GROWTH < 24 HOURS Performed at [place] [address] [place], [place] [address]    Report Status PENDING  Incomplete      Radiology Studies: DG CHEST PORT 1 VIEW  Result Date: 01/15/2021 CLINICAL DATA:  45 year old female with history of empyema, chest tube. EXAM: PORTABLE CHEST 1 VIEW COMPARISON:  Portable chest 01/14/2021 and earlier. FINDINGS: Portable AP upright view at 0515 hours. 2 right chest tubes now in place and stable since 1531 hours yesterday. Small volume right chest wall and neck subcutaneous gas has increased. No definite pneumothorax. Streaky right lung base opacity is stable. Mediastinal contours remain within normal limits. Left lung remains clear when allowing for portable technique. Paucity of bowel gas in the upper abdomen. No acute osseous abnormality identified. IMPRESSION: 1. Stable right chest tubes with no definite pneumothorax. Improved right lung base ventilation from yesterday morning with residual patchy opacity. 2. Left lung remains clear. Electronically Signed   By: Genevie Ann M.D.   On: 01/15/2021 06:10   DG Chest Port 1 View  Result Date: 01/14/2021 CLINICAL DATA:  Recent empyema EXAM: PORTABLE CHEST 1 VIEW COMPARISON:  January 14, 2021 study obtained earlier in the day FINDINGS: Pigtail catheter has been removed from the right side with 2 chest tubes now present on the right. There is trace right basilar pneumothorax  currently. Atelectatic change is noted in the right base. Lungs elsewhere clear. Heart size and pulmonary vascularity are normal. No adenopathy. No bone lesions. IMPRESSION: Chest tubes present on the right with minimal right base pneumothorax. Other components of pneumothorax have resolved. There is right base atelectasis. No edema or consolidation. Heart size normal. Electronically Signed   By: Lowella Grip III M.D.   On: 01/14/2021 15:55       LOS: 7 days   [facility] Pager on [email or web address]  01/16/2021, 7:50 AM

## 2021-01-17 ENCOUNTER — Inpatient Hospital Stay (HOSPITAL_COMMUNITY): Payer: Self-pay

## 2021-01-17 LAB — CBC
HCT: 21.1 % — ABNORMAL LOW (ref 36.0–46.0)
Hemoglobin: 7.2 g/dL — ABNORMAL LOW (ref 12.0–15.0)
MCH: 34.4 pg — ABNORMAL HIGH (ref 26.0–34.0)
MCHC: 34.1 g/dL (ref 30.0–36.0)
MCV: 101 fL — ABNORMAL HIGH (ref 80.0–100.0)
Platelets: 241 10*3/uL (ref 150–400)
RBC: 2.09 MIL/uL — ABNORMAL LOW (ref 3.87–5.11)
RDW: 16.8 % — ABNORMAL HIGH (ref 11.5–15.5)
WBC: 10.3 10*3/uL (ref 4.0–10.5)
nRBC: 0 % (ref 0.0–0.2)

## 2021-01-17 LAB — COMPREHENSIVE METABOLIC PANEL
ALT: 53 U/L — ABNORMAL HIGH (ref 0–44)
AST: 62 U/L — ABNORMAL HIGH (ref 15–41)
Albumin: 2.2 g/dL — ABNORMAL LOW (ref 3.5–5.0)
Alkaline Phosphatase: 48 U/L (ref 38–126)
Anion gap: 8 (ref 5–15)
BUN: 6 mg/dL (ref 6–20)
CO2: 22 mmol/L (ref 22–32)
Calcium: 8.1 mg/dL — ABNORMAL LOW (ref 8.9–10.3)
Chloride: 98 mmol/L (ref 98–111)
Creatinine, Ser: 0.42 mg/dL — ABNORMAL LOW (ref 0.44–1.00)
GFR, Estimated: >60 mL/min (ref >60)
Glucose, Bld: 94 mg/dL (ref 70–99)
Potassium: 3.6 mmol/L (ref 3.5–5.1)
Sodium: 128 mmol/L — ABNORMAL LOW (ref 135–145)
Total Bilirubin: 0.7 mg/dL (ref 0.3–1.2)
Total Protein: 5.6 g/dL — ABNORMAL LOW (ref 6.5–8.1)

## 2021-01-17 LAB — TYPE AND SCREEN
ABO/RH(D): A POS
Antibody Screen: NEGATIVE
Unit division: 0
Unit division: 0

## 2021-01-17 LAB — BPAM RBC
Blood Product Expiration Date: 202202202359
Blood Product Expiration Date: 202202202359
Unit Type and Rh: 6200
Unit Type and Rh: 6200

## 2021-01-17 NOTE — Progress Notes (Addendum)
      [id number]       [place] [address]             [phone]      3 Days Post-Op Procedure(s) (LRB): VIDEO ASSISTED THORACOSCOPY (VATS)/DRAIN EMPYEMA (Right) DECORTICATION (Right)   Subjective:  States she didn't sleep well last night. She also mentions lesions along her right arm, that have now all scabbed over.   Objective: Vital signs in last 24 hours: Temp:  [98 F (36.7 C)-99.1 F (37.3 C)] 98.5 F (36.9 C) (01/30 0912) Pulse Rate:  [59-78] 75 (01/30 0912) Cardiac Rhythm: Normal sinus rhythm (01/30 0835) Resp:  [17-20] 20 (01/30 0912) BP: (99-125)/(52-65) 125/60 (01/30 0912) SpO2:  [98 %-100 %] 100 % (01/30 0912)  Intake/Output from previous day: 01/29 0701 - 01/30 0700 In: 0  Out: 230 [Chest Tube:230]  General appearance: alert, cooperative and no distress Heart: regular rate and rhythm Lungs: clear to auscultation bilaterally Abdomen: soft, non-tender; bowel sounds normal; no masses,  no organomegaly Extremities: extremities normal, atraumatic, no cyanosis or edema Wound: clean and dry  Lab Results: Recent Labs    01/16/21 0152 01/17/21 0310  WBC 12.6* 10.3  HGB 7.1* 7.2*  HCT 20.9* 21.1*  PLT 236 241   BMET:  Recent Labs    01/16/21 0152 01/17/21 0310  NA 127* 128*  K 3.3* 3.6  CL 100 98  CO2 21* 22  GLUCOSE 84 94  BUN 6 6  CREATININE 0.54 0.42*  CALCIUM 7.6* 8.1*    PT/INR: No results for input(s): LABPROT, INR in the last 72 hours. ABG    Component Value Date/Time   PHART 7.497 (H) 01/15/2021 0350   HCO3 21.4 01/15/2021 0350   TCO2 24 06/20/2015 0150   ACIDBASEDEF 1.5 01/15/2021 0350   O2SAT 99.2 01/15/2021 0350   CBG (last 3)  No results for input(s): GLUCAP in the last 72 hours.  Assessment/Plan: S/P Procedure(s) (LRB): VIDEO ASSISTED THORACOSCOPY (VATS)/DRAIN EMPYEMA (Right) DECORTICATION (Right)  1. Empyema- ABX per primary, OR cultures no growth x 2 days 2. Pulm- no air leak, 100 cc output  yesterday, trace basilar pneumothorax present, will likely d/c one chest tube today 3. Dispo- patient stable, no air leak present, 100 cc output yesterday, trace basilar pneumothorax, will d/c 1 chest tube today, repeat CXR in AM, care per primary   LOS: 8 days    Ellwood Handler, PA-C 01/17/2021   Chart reviewed, patient examined, agree with above. Chest tube output low and CXR stable. DC one of chest tubes today and repeat CXR in am.

## 2021-01-17 NOTE — Progress Notes (Signed)
Occupational Therapy Treatment Patient Details Name: [name] MRN: [id number] DOB: [birth year] Today's Date: 01/17/2021    History of present illness 45 y.o. female with medical history significant of alcohol abuse, tobacco use, history of cervical cancer, atopic dermatitis admitted to [facility] on 01/04/2021 for feeling weak for about 2 weeks.  She was treated for sepsis and acute hypoxic respiratory failure secondary to COVID-19 infection with superimposed bacterial infection. Undewrent ultrasound-guided thoracentesis on 1/18 and thoracostomy under CT guidance by IR on 1/21. Underwent VATS procedure 01/14/21. Transitioned to water seal chest-tube Rt 1/29.   OT comments  Pt progressing towards established OT goals. Pt performing functional mobility in hallway with Supervision and RW. Pt presenting with fatigue with activity. VSS on RA. Adding goal for theraband exercises to increased strength and endurance and optimize functional performance with ADLs/IADLs. Continue to recommend dc to home once medically stable per physician. Will continue to follow acutely as admitted.    Follow Up Recommendations  No OT follow up;Supervision - Intermittent    Equipment Recommendations  None recommended by OT    Recommendations for Other Services      Precautions / Restrictions Precautions Precautions: Other (comment) Precaution Comments: Chest tube on the Right       Mobility Bed Mobility Overal bed mobility: Independent                Transfers Overall transfer level: Needs assistance Equipment used: Rolling walker (2 wheeled) Transfers: Sit to/from Stand Sit to Stand: Supervision         General transfer comment: Cues for chest tube management, assisted with lines/leads.    Balance Overall balance assessment: No apparent balance deficits (not formally assessed)                                         ADL either performed or assessed with clinical judgement    ADL Overall ADL's : Needs assistance/impaired                         Toilet Transfer: Supervision/safety;Ambulation;RW           Functional mobility during ADLs: Supervision/safety;Rolling walker General ADL Comments: Pt performing mobility with Supervision and RW. Continues to present with fatigue     Vision       Perception     Praxis      Cognition Arousal/Alertness: Awake/alert Behavior During Therapy: WFL for tasks assessed/performed Overall Cognitive Status: Within Functional Limits for tasks assessed                                 General Comments: Becoming overstimulated easily; repeating that she feels more tired today because she didnt sleep well last night        Exercises     Shoulder Instructions       General Comments VSS on RA    Pertinent Vitals/ Pain       Pain Assessment: Faces Faces Pain Scale: Hurts little more Pain Location: R chest wall at Chest tube insertion Pain Descriptors / Indicators: Tender Pain Intervention(s): Monitored during session;Limited activity within patient's tolerance;Repositioned  Home Living  Prior Functioning/Environment              Frequency  Min 2X/week        Progress Toward Goals  OT Goals(current goals can now be found in the care plan section)  Progress towards OT goals: Progressing toward goals  Acute Rehab OT Goals Patient Stated Goal: get better OT Goal Formulation: With patient Potential to Achieve Goals: Good ADL Goals Pt Will Perform Lower Body Dressing: with modified independence;sitting/lateral leans;sit to/from stand Pt/caregiver will Perform Home Exercise Program: Increased strength;Both right and left upper extremity;With written HEP provided;Independently;With theraband Additional ADL Goal #1: Pt will perform x10 mins of OOB ADL with modified independence.  Plan Discharge plan remains  appropriate    Co-evaluation                 AM-PAC OT "6 Clicks" Daily Activity     Outcome Measure   Help from another person eating meals?: None Help from another person taking care of personal grooming?: None Help from another person toileting, which includes using toliet, bedpan, or urinal?: A Little Help from another person bathing (including washing, rinsing, drying)?: A Little Help from another person to put on and taking off regular upper body clothing?: None Help from another person to put on and taking off regular lower body clothing?: A Little 6 Click Score: 21    End of Session Equipment Utilized During Treatment: Rolling walker  OT Visit Diagnosis: Unsteadiness on feet (R26.81);Muscle weakness (generalized) (M62.81)   Activity Tolerance Patient tolerated treatment well   Patient Left in bed;with call bell/phone within reach;with nursing/sitter in room   Nurse Communication Mobility status        Time: 2119-4174 OT Time Calculation (min): 21 min  Charges: OT General Charges $OT Visit: 1 Visit OT Treatments $Therapeutic Activity: 8-22 mins  [place], OTR/L Acute Rehab Pager: [phone] Office: [place] 01/17/2021, 1:18 PM

## 2021-01-17 NOTE — Progress Notes (Signed)
[facility] PROGRESS NOTE   [name] [id number] DOB: [birth year] DOA: 01/09/2021  PCP: Langston Reusing, NP  Brief History/Interval Summary: 45 y.o. female with medical history significant of alcohol abuse, tobacco use, history of cervical cancer, atopic dermatitis admitted to [facility] on 01/04/2021 for feeling weak for about 2 weeks.  She was treated for sepsis and acute hypoxic respiratory failure secondary to COVID-19 infection with superimposed bacterial infection.  She was treated with steroids and a 5-day course of remdesivir.  Work-up also revealed a right moderate pleural effusion.  Pleural fluid culture grew Streptococcus intermedius.  Patient underwent ultrasound-guided thoracentesis on 1/18 and thoracostomy under CT guidance by IR on 1/21.  Right chest tube draining mucopurulent debris.  ID was consulted and recommended antibiotics for at least 3 to 4 weeks.  She was sent to [facility] for evaluation by CT surgery for more definitive treatment of multiloculated effusion.    Reason for Visit: Empyema  Consultants: Cardiothoracic surgery  Procedures: Right chest tube placement, VATS  Antibiotics: Anti-infectives (From admission, onward)   Start     Dose/Rate Route Frequency Ordered Stop   01/14/21 0600  vancomycin (VANCOCIN) IVPB 1000 mg/200 mL premix        1,000 mg 200 mL/hr over 60 Minutes Intravenous On call to O.R. 01/13/21 1453 01/14/21 1145   01/09/21 2345  Ampicillin-Sulbactam (UNASYN) 3 g in sodium chloride 0.9 % 100 mL IVPB        3 g 200 mL/hr over 30 Minutes Intravenous Every 6 hours 01/09/21 2247        Subjective/Interval History: Patient continues to complain of pain at the site of chest tube insertion site - poorly controlled overnight. Otherwise denies fevers,chills, headache, nausea, vomiting.  Assessment/Plan:  Right-sided empyema s/p pigtail catheter 1/21; VATS 1/27 Possible removal of either or both chest tube today per  bedside chat with [facility] Pleural fluid cultures grew Streptococcus intermedius. Patient underwent thoracentesis on 1/18 followed by a pigtail catheter placement under CT guidance by interventional radiology on 1/21. Cardiothoracic surgery is following.  VATS 01/14/21 Patient was also seen by infectious disease recommending antibiotics for 3 to 4 weeks. Currently on Unasyn with expected transition to Augmentin depending on clinical progress/chest tube removal removal. Continue probiotics  Acute blood loss anemia on chronic anemia(unspecified subtype), multifactorial, ongoing Patient noted to have chronic anemia -unspecified.   Initial anemia 1/25 status post chest tube washout s/p 1u PRBC - stabilized at that time Postoperatively hemoglobin downtrending slightly to 7.1 on 28th, remains asymptomatic -scant serosanguineous fluid in the chest tube today  Pneumonia due to COVID-19 Completed 5-day course of Remdesivir. She has been weaned off of oxygen. Currently saturating normal on room air. Remains on dexamethasone 6 mg daily. Continue incentive spirometer. Based on x-ray findings from the time of admission and her symptoms it looks like her symptoms are primarily due to empyema.  She has a positive test result from 1/17.  Does not need more than 10 days of isolation. She is off covid isolation as of 1/28.  Hyponatremia/hypomagnesemia/hypokalemia, improving Likely hypovolemic vs SIADH from lung infection Sodium was 114 on 1/17.  Labs stabilizing now - likely at baseline (around 128-130 2 years ago)  History of alcohol abuse No evidence for withdrawal. Continue thiamine folate multivitamins. CIWA protocol likely unnecessary given outside of window at this point.  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 Continues on midodrine.  Cortisol stimulation test shows mild improvement in cortisol (but not to expected  levels) - concern for secondary adrenal insufficiency in the setting  of infection.  Abnormal thyroid function tests TSH noted to be 7.9 with a free T4 which is normal at 0.85.  To be rechecked in the outpatient setting in a few weeks.    Positive hep C antibody/mild transaminitis Patient also with history of alcohol use.  HCV titer and genotype pending.   Ultrasound of the abdomen showed diffusely increased hepatic echogenicity compatible with hepatic steatosis.  Developing cirrhotic changes were also noted.  Cholelithiasis and biliary sludge was also noted.  No cholecystitis was noted. HIV screening was negative.   Will need outpatient consultation with ID as well as gastroenterology.  Moderate protein calorie malnutrition Encourage oral intake.  Tobacco abuse Nicotine patch.   DVT Prophylaxis: Lovenox Code Status: Full code Family Communication: Discussed with the patient. Disposition Plan: Hopefully return home when improved, will need CT surgery sign off as well.  Status is: Inpatient  Remains inpatient appropriate because:IV treatments appropriate due to intensity of illness or inability to take PO and Inpatient level of care appropriate due to severity of illness   Dispo: The patient is from: Home              Anticipated d/c is to: Home              Anticipated d/c date is: 3 days              Patient currently is not medically stable to d/c.   Difficult to place patient No    Medications:  Scheduled: . sodium chloride   Intravenous Once  . bisacodyl  10 mg Oral Daily  . Chlorhexidine Gluconate Cloth  6 each Topical Q0600  . dexamethasone (DECADRON) injection  6 mg Intravenous Q24H  . enoxaparin (LOVENOX) injection  40 mg Subcutaneous Q24H  . feeding supplement  237 mL Oral BID BM  . folic acid  1 mg Oral Daily  . midodrine  5 mg Oral BID WC  . multivitamin with minerals  1 tablet Oral Daily  . mupirocin ointment  1 application Nasal BID  . nicotine  14 mg Transdermal Daily  . pantoprazole  40 mg Oral Q1200  . saccharomyces  boulardii  250 mg Oral BID  . senna-docusate  1 tablet Oral QHS  . thiamine  100 mg Oral Daily   Continuous: . sodium chloride Stopped (01/15/21 1420)  . ampicillin-sulbactam (UNASYN) IV 3 g (01/17/21 0526)   WPY:KDXI & mag hydroxide-simeth, calcium carbonate, diphenhydrAMINE, guaiFENesin-dextromethorphan, ibuprofen, loperamide, methocarbamol, ondansetron (ZOFRAN) IV, oxyCODONE, traMADol   Objective:  Vital Signs  Vitals:   01/16/21 1448 01/16/21 2053 01/16/21 2359 01/17/21 0405  BP: 99/60 (!) 101/58 (!) 113/54 121/65  Pulse: 71 68 (!) 59 60  Resp: 18 17 19 19   Temp: 98.2 F (36.8 C) 98.9 F (37.2 C) 98.8 F (37.1 C) 99.1 F (37.3 C)  TempSrc: Oral Oral Oral Oral  SpO2: 100% 98% 100% 100%  Weight:      Height:        Intake/Output Summary (Last 24 hours) at 01/17/2021 0741 Last data filed at 01/17/2021 0526 Gross per 24 hour  Intake 0 ml  Output 230 ml  Net -230 ml   Filed Weights   01/14/21 1156  Weight: 58.7 kg    General appearance: Awake alert.  In no distress Resp: Chest tube on the right - draining serosanguinous fluid Cardio: S1-S2 is normal regular.  No S3-S4.  No rubs murmurs or  bruit GI: Abdomen is soft.  Nontender nondistended.  Bowel sounds are present normal.  No masses organomegaly Extremities: No edema.  Full range of motion of lower extremities. Neurologic: Alert and oriented x3.  No focal neurological deficits.   Lab Results:  Data Reviewed: I have personally reviewed following labs and imaging studies  CBC: Recent Labs  Lab 01/13/21 0829 01/14/21 0248 01/15/21 0346 01/16/21 0152 01/17/21 0310  WBC 14.5* 14.1* 16.0* 12.6* 10.3  HGB 9.0* 7.9* 7.1* 7.1* 7.2*  HCT 25.1* 22.5* 19.8* 20.9* 21.1*  MCV 98.8 98.3 98.5 102.0* 101.0*  PLT 278 257 239 236 409    Basic Metabolic Panel: Recent Labs  Lab 01/11/21 0700 01/12/21 0244 01/13/21 0829 01/14/21 0248 01/15/21 0346 01/16/21 0152 01/17/21 0310  NA 129*   < > 129* 127* 127* 127*  128*  K 3.7   < > 4.2 3.8 3.4* 3.3* 3.6  CL 97*   < > 99 99 98 100 98  CO2 24   < > 21* 20* 19* 21* 22  GLUCOSE 87   < > 76 93 125* 84 94  BUN 10   < > 7 7 8 6 6   CREATININE 0.61   < > 0.57 0.51 0.54 0.54 0.42*  CALCIUM 7.8*   < > 8.2* 7.8* 7.2* 7.6* 8.1*  MG 1.7  --  1.5*  --   --   --   --    < > = values in this interval not displayed.    GFR: Estimated Creatinine Clearance: 70.5 mL/min (A) (by C-G formula based on SCr of 0.42 mg/dL (L)).  Liver Function Tests: Recent Labs  Lab 01/13/21 0829 01/14/21 0248 01/15/21 0346 01/16/21 0152 01/17/21 0310  AST 146* 92* 65* 59* 62*  ALT 73* 61* 51* 47* 53*  ALKPHOS 58 50 46 44 48  BILITOT 1.4* 1.0 0.7 0.6 0.7  PROT 6.1* 5.5* 4.9* 5.0* 5.6*  ALBUMIN 2.3* 2.1* 2.1* 2.0* 2.2*      CBG: No results for input(s): GLUCAP in the last 168 hours.     Recent Results (from the past 240 hour(s))  MRSA PCR Screening     Status: Abnormal   Collection Time: 01/13/21  8:57 PM   Specimen: Nasal Mucosa; Nasopharyngeal  Result Value Ref Range Status   MRSA by PCR POSITIVE (A) NEGATIVE Final    Comment:        The GeneXpert MRSA Assay (FDA approved for NASAL specimens only), is one component of a comprehensive MRSA colonization surveillance program. It is not intended to diagnose MRSA infection nor to guide or monitor treatment for MRSA infections. RESULT CALLED TO, READ BACK BY AND VERIFIED WITH: Gearldine Shown RN 01/14/21 0211 JDW Performed at [facility], [place] [address] [place], [place] [address]   Aerobic/Anaerobic Culture (surgical/deep wound)     Status: None (Preliminary result)   Collection Time: 01/14/21 10:57 AM   Specimen: Pleural, Right; Body Fluid  Result Value Ref Range Status   Specimen Description TISSUE  Final   Special Requests RIGHT PLEURAL PEEL  Final   Gram Stain   Final    ABUNDANT WBC PRESENT, PREDOMINANTLY PMN RARE GRAM POSITIVE COCCI Performed at [facility], [address] [place],  [place] [address]    Culture RARE STREPTOCOCCUS INTERMEDIUS  Final   Report Status PENDING  Incomplete  Aerobic/Anaerobic Culture (surgical/deep wound)     Status: None (Preliminary result)   Collection Time: 01/14/21 11:08 AM  Specimen: PATH Other; Tissue  Result Value Ref Range Status   Specimen Description PLEURAL  Final   Special Requests RIGHT SIDE A  Final   Gram Stain   Final    ABUNDANT WBC PRESENT, PREDOMINANTLY PMN NO ORGANISMS SEEN    Culture   Final    NO GROWTH 2 DAYS Performed at [facility], [address], [place], [place] [address]    Report Status PENDING  Incomplete      Radiology Studies: DG CHEST PORT 1 VIEW  Result Date: 01/17/2021 CLINICAL DATA:  Lung surgery follow-up EXAM: PORTABLE CHEST 1 VIEW COMPARISON:  Yesterday FINDINGS: Right-sided chest tubes in place. No visible pneumothorax at the apex. Small volume pneumothorax at the right base. Streaky atelectasis at the right base which appears similar. Clear left lung. Normal heart size. IMPRESSION: Similar degree of atelectasis and basal pneumothorax on the right. Electronically Signed   By: Monte Fantasia M.D.   On: 01/17/2021 07:31   DG CHEST PORT 1 VIEW  Result Date: 01/16/2021 CLINICAL DATA:  Status post lung surgery. Evaluate chest tubes. Evaluate pneumothorax. EXAM: PORTABLE CHEST 1 VIEW COMPARISON:  Chest x-rays dated 01/15/2021 and 01/14/2021. Chest CT dated 01/10/2021. FINDINGS: RIGHT-sided chest tubes are stable in position. Probable small residual pneumothorax at the RIGHT lung base and at the RIGHT lung apex. Stable atelectasis and/or small pleural effusion at the RIGHT lung base. LEFT lung is clear. Heart size and mediastinal contours are stable in size and configuration IMPRESSION: 1. Probable small residual pneumothorax at the RIGHT lung base and at the RIGHT lung apex. RIGHT-sided chest tubes are stable in position. 2. Stable atelectasis and/or small pleural effusion at the RIGHT lung base.  Electronically Signed   By: Franki Cabot M.D.   On: 01/16/2021 10:35       LOS: 8 days   [facility] Pager on [email or web address]  01/17/2021, 7:41 AM

## 2021-01-18 ENCOUNTER — Inpatient Hospital Stay (HOSPITAL_COMMUNITY): Payer: Self-pay

## 2021-01-18 DIAGNOSIS — J869 Pyothorax without fistula: Secondary | ICD-10-CM | POA: Diagnosis not present

## 2021-01-18 DIAGNOSIS — U071 COVID-19: Secondary | ICD-10-CM | POA: Diagnosis not present

## 2021-01-18 LAB — COMPREHENSIVE METABOLIC PANEL
ALT: 69 U/L — ABNORMAL HIGH (ref 0–44)
AST: 83 U/L — ABNORMAL HIGH (ref 15–41)
Albumin: 2.3 g/dL — ABNORMAL LOW (ref 3.5–5.0)
Alkaline Phosphatase: 47 U/L (ref 38–126)
Anion gap: 10 (ref 5–15)
BUN: 6 mg/dL (ref 6–20)
CO2: 21 mmol/L — ABNORMAL LOW (ref 22–32)
Calcium: 8.5 mg/dL — ABNORMAL LOW (ref 8.9–10.3)
Chloride: 98 mmol/L (ref 98–111)
Creatinine, Ser: 0.48 mg/dL (ref 0.44–1.00)
GFR, Estimated: >60 mL/min (ref >60)
Glucose, Bld: 117 mg/dL — ABNORMAL HIGH (ref 70–99)
Potassium: 3.9 mmol/L (ref 3.5–5.1)
Sodium: 129 mmol/L — ABNORMAL LOW (ref 135–145)
Total Bilirubin: 0.5 mg/dL (ref 0.3–1.2)
Total Protein: 5.8 g/dL — ABNORMAL LOW (ref 6.5–8.1)

## 2021-01-18 LAB — CBC
HCT: 19.9 % — ABNORMAL LOW (ref 36.0–46.0)
Hemoglobin: 7.1 g/dL — ABNORMAL LOW (ref 12.0–15.0)
MCH: 35.7 pg — ABNORMAL HIGH (ref 26.0–34.0)
MCHC: 35.7 g/dL (ref 30.0–36.0)
MCV: 100 fL (ref 80.0–100.0)
Platelets: 203 10*3/uL (ref 150–400)
RBC: 1.99 MIL/uL — ABNORMAL LOW (ref 3.87–5.11)
RDW: 16.8 % — ABNORMAL HIGH (ref 11.5–15.5)
WBC: 8.4 10*3/uL (ref 4.0–10.5)
nRBC: 0 % (ref 0.0–0.2)

## 2021-01-18 MED ORDER — GUAIFENESIN ER 600 MG PO TB12
600.0000 mg | ORAL_TABLET | Freq: Two times a day (BID) | ORAL | Status: DC
  Administered 2021-01-18 – 2021-01-19 (×2): 600 mg via ORAL
  Filled 2021-01-18 (×2): qty 1

## 2021-01-18 NOTE — Discharge Instructions (Signed)
Video-Assisted Thoracic Surgery, Care After The following information offers guidance on how to care for yourself after your procedure. Your health care provider may also give you more specific instructions. If you have problems or questions, contact your health care provider. What can I expect after the procedure? After the procedure, it is common to have:  Some pain and soreness in your chest.  Pain when breathing in (inhaling) or coughing.  Constipation.  Tiredness.  Trouble sleeping. Follow these instructions at home: Preventing pneumonia  Do deep breathing exercises and cough regularly as directed. This helps clear mucus and opens your lungs. Doing this helps prevent lung infection (pneumonia).  If you were given an incentive spirometer, use it as told. An incentive spirometer is a tool that measures how well you are filling your lungs with each breath.  Coughing may hurt less if you try to support your chest. This is called splinting. Try one of these when you cough: ? Hold a pillow against your chest. ? Place the palms of both hands on top of your incision area.  Do not use any products that contain nicotine or tobacco. These products include cigarettes, chewing tobacco, and vaping devices, such as e-cigarettes. If you need help quitting, ask your health care provider.  Avoid secondhand smoke.   Medicines  Take over-the-counter and prescription medicines only as told by your health care provider.  If you have pain, take pain medicine before your pain becomes severe. This is important. If your pain is under control, you will be able to breathe and cough more comfortably.  If you were prescribed an antibiotic medicine, take it as told by your health care provider. Do not stop taking the antibiotic even if you start to feel better.  Ask your health care provider if the medicine prescribed to you: ? Requires you to avoid driving or using machinery. ? Can cause constipation.  You may need to take these actions to prevent or treat constipation:  Drink enough fluid to keep your urine pale yellow.  Take over-the-counter or prescription medicines.  Eat foods that are high in fiber, such as beans, whole grains, and fresh fruits and vegetables.  Limit foods that are high in fat and processed sugars, such as fried or sweet foods. Bathing  Do not take baths, swim, or use a hot tub until your health care provider approves.  Ask your health care provider if you may take showers. You may only be allowed to take sponge baths. Incision care  Follow instructions from your health care provider about how to take care of your incision area. Make sure you: ? Wash your hands with soap and water for at least 20 seconds before and after you change your bandage (dressing). If soap and water are not available, use hand sanitizer. ? Change your dressing as told by your health care provider. ? Leave stitches (sutures), skin glue, staples, or adhesive strips in place. These skin closures may need to stay in place for 2 weeks or longer. If adhesive strip edges start to loosen and curl up, you may trim the loose edges. Do not remove adhesive strips completely unless your health care provider tells you to do that.  Keep your dressing dry until it has been removed.  Check your incision area every day for signs of infection. Check for: ? Redness, swelling, or more pain. ? Fluid or blood. ? Warmth. ? Pus or a bad smell.   Activity  Avoid activities that use your chest   muscles for at least 3-4 weeks.  Do not lift anything that is heavier than 10 lb (4.5 kg), or the limit that you are told, until your health care provider says that it is safe.  Return to your normal activities as told by your health care provider. Ask your health care provider what activities are safe for you.  Rest as told by your health care provider.  Avoid sitting for a long time without moving. Get up to take  short walks every 1-2 hours. This is important to improve blood flow and breathing. Ask for help if you feel weak or unsteady.  Do exercises as told by your health care provider. General instructions  If you were given a sedative during the procedure, it can affect you for several hours. Do not drive or operate machinery until your health care provider says that it is safe.  If you have a chest tube, care for it as told. Do not travel by airplane during the 2 weeks after your chest tube is removed, or until your health care provider says that this is safe.  Keep all follow-up visits. This is important. Contact a health care provider if:  You have any of these signs of infection: ? Redness, swelling, or more pain around any incision. ? Fluid or blood coming from any incision. ? Warmth coming from any incision. ? Pus or a bad smell coming from any incision. ? A fever or chills.  You have nausea or vomiting.  You have pain that does not get better with medicine. Get help right away if:  You have chest pain.  You have fast or irregular heartbeats.  You develop a rash.  You have shortness of breath or trouble breathing.  You are confused.  You have trouble speaking.  You feel weak, light-headed, or dizzy, or you faint. These symptoms may represent a serious problem that is an emergency. Do not wait to see if the symptoms will go away. Get medical help right away. Call your local emergency services (911 in the [place] Do not drive yourself to the hospital. Summary  Take deep breaths and cough often. This helps clear mucus and opens your lungs. Doing this helps prevent lung infection (pneumonia).  If you have pain, take pain medicine before your pain becomes severe. This is important. If your pain is under control, you will be able to breathe and cough more comfortably.  Check your incision area every day for signs of infection. Contact your health care provider if you have any  signs of infection.  Ask your health care provider what activities are safe for you. This information is not intended to replace advice given to you by your health care provider. Make sure you discuss any questions you have with your health care provider. Document Revised: 08/28/2020 Document Reviewed: 08/28/2020 Elsevier Patient Education  2021 Elsevier Inc.  

## 2021-01-18 NOTE — Progress Notes (Signed)
PT Cancellation Note  Patient Details Name: ROSHANDA BALAZS MRN: [id number] DOB: 12-02-1976   Cancelled Treatment:    Reason Eval/Treat Not Completed: Patient declined, no reason specified. Patient just had chest tube removed, states she will walk later. Will re-attempt at later day/time.    Jamilya Sarrazin 01/18/2021, 1:42 PM

## 2021-01-18 NOTE — Progress Notes (Signed)
Occupational Therapy Treatment Patient Details Name: [name] MRN: [id number] DOB: 11/19/1976 Today's Date: 01/18/2021    History of present illness 45 y.o. female with medical history significant of alcohol abuse, tobacco use, history of cervical cancer, atopic dermatitis admitted to [facility] on 01/04/2021 for feeling weak for about 2 weeks.  She was treated for sepsis and acute hypoxic respiratory failure secondary to COVID-19 infection with superimposed bacterial infection. Undewrent ultrasound-guided thoracentesis on 1/18 and thoracostomy under CT guidance by IR on 1/21. Underwent VATS procedure 01/14/21. Transitioned to water seal chest-tube Rt 1/29.   OT comments  Pt had just gone to bathroom and wanted to stay in bed. Pt currently using orange theraband for BUE strength x10 reps shoulder through elbow. Pt reports "I have done this before." Pt provided 2 orange therabands to stay in room. Pt would benefit from continued OT skilled services. OT following acutely.   Follow Up Recommendations  No OT follow up;Supervision - Intermittent    Equipment Recommendations  None recommended by OT    Recommendations for Other Services      Precautions / Restrictions Precautions Precautions: Other (comment) Precaution Comments: Chest tube on the Right Restrictions Weight Bearing Restrictions: No       Mobility Bed Mobility Overal bed mobility: Independent                Transfers                      Balance Overall balance assessment: No apparent balance deficits (not formally assessed)                                         ADL either performed or assessed with clinical judgement   ADL Overall ADL's : Needs assistance/impaired                                     Functional mobility during ADLs: Supervision/safety;Rolling walker General ADL Comments: Pt continues to be supervisionA.     Vision       Perception      Praxis      Cognition Arousal/Alertness: Awake/alert Behavior During Therapy: WFL for tasks assessed/performed Overall Cognitive Status: Within Functional Limits for tasks assessed                                          Exercises Exercises: Other exercises Other Exercises Other Exercises: theraband exercise x10 reps shoulder flex, shoulder horiz abduction, bicep flex/ext with orange theraband   Shoulder Instructions       General Comments VSS on RA    Pertinent Vitals/ Pain       Pain Assessment: Faces Faces Pain Scale: Hurts little more Pain Location: R chest wall at Chest tube insertion Pain Descriptors / Indicators: Tender Pain Intervention(s): Monitored during session  Home Living                                          Prior Functioning/Environment              Frequency  Min 2X/week  Progress Toward Goals  OT Goals(current goals can now be found in the care plan section)  Progress towards OT goals: Progressing toward goals  Acute Rehab OT Goals Patient Stated Goal: get better OT Goal Formulation: With patient Potential to Achieve Goals: Good ADL Goals Pt Will Perform Lower Body Dressing: with modified independence;sitting/lateral leans;sit to/from stand Pt/caregiver will Perform Home Exercise Program: Increased strength;Both right and left upper extremity;With written HEP provided;Independently;With theraband Additional ADL Goal #1: Pt will perform x10 mins of OOB ADL with modified independence.  Plan Discharge plan remains appropriate    Co-evaluation                 AM-PAC OT "6 Clicks" Daily Activity     Outcome Measure   Help from another person eating meals?: None Help from another person taking care of personal grooming?: None Help from another person toileting, which includes using toliet, bedpan, or urinal?: A Little Help from another person bathing (including washing, rinsing,  drying)?: A Little Help from another person to put on and taking off regular upper body clothing?: None Help from another person to put on and taking off regular lower body clothing?: A Little 6 Click Score: 21    End of Session Equipment Utilized During Treatment: Rolling walker  OT Visit Diagnosis: Unsteadiness on feet (R26.81);Muscle weakness (generalized) (M62.81)   Activity Tolerance Patient tolerated treatment well   Patient Left in bed;with call bell/phone within reach;with nursing/sitter in room   Nurse Communication Mobility status        Time: 1100-1120 OT Time Calculation (min): 20 min  Charges: OT General Charges $OT Visit: 1 Visit OT Treatments $Therapeutic Exercise: 8-22 mins  Jefferey Pica, OTR/L [place] Pager: [phone] Office: [phone]    Adriana Lina C 01/18/2021, 2:31 PM

## 2021-01-18 NOTE — Progress Notes (Signed)
Pharmacy Antibiotic Note  [name] is a 45 y.o. female admitted on 01/09/2021 with Strep intermedius empyema.  Pharmacy has been consulted for ampicillin/sulbactam dosing. Cr has remained stable, repeat pleural tissue culture with rare strep.  Plan:   Continue amp/sulbactam 3g IV q6hr Monitor cultures, clinical status, renal fx, and f/u duration   Temp (24hrs), Avg:98.6 F (37 C), Min:98.3 F (36.8 C), Max:98.9 F (37.2 C)  Recent Labs  Lab 01/14/21 0248 01/15/21 0346 01/16/21 0152 01/17/21 0310 01/18/21 0130  WBC 14.1* 16.0* 12.6* 10.3 8.4  CREATININE 0.51 0.54 0.54 0.42* 0.48    Estimated Creatinine Clearance: 70.5 mL/min (by C-G formula based on SCr of 0.48 mg/dL).    Allergies  Allergen Reactions  . Acetaminophen     Intolerance, hx of tylenol OD     Antimicrobials this admission: Ampsulb 1/19 >>  CTX 1/17>> 1/18  Azithro 1/17 Remdesevir 1/17>> 1/21  Microbiology results: 1/17 BCx: ngtd 1/18 Pleural Cx: moderate Strep intermedius S PCN   1/27 Pleural Cx: rare Strep intermedius  Arrie Senate, PharmD, BCPS, [facility] Clinical Pharmacist [phone] Please check AMION for all [facility] Pharmacy numbers 01/18/2021

## 2021-01-18 NOTE — Progress Notes (Addendum)
[address]       [employer] [address]             [phone]      4 Days Post-Op Procedure(s) (LRB): VIDEO ASSISTED THORACOSCOPY (VATS)/DRAIN EMPYEMA (Right) DECORTICATION (Right) Subjective: Some soreness, relatively mild  Objective: Vital signs in last 24 hours: Temp:  [98.3 F (36.8 C)-98.9 F (37.2 C)] 98.9 F (37.2 C) (01/31 0443) Pulse Rate:  [65-75] 75 (01/31 0443) Cardiac Rhythm: Normal sinus rhythm (01/30 2000) Resp:  [14-20] 18 (01/31 0443) BP: (113-125)/(52-101) 118/82 (01/31 0443) SpO2:  [98 %-100 %] 100 % (01/31 0443)  Hemodynamic parameters for last 24 hours:    Intake/Output from previous day: 01/30 0701 - 01/31 0700 In: -  Out: 70 [Chest Tube:70] Intake/Output this shift: No intake/output data recorded.  General appearance: alert, cooperative and no distress Heart: regular rate and rhythm Lungs: dim right base Abdomen: benign Extremities: no edema or calf tenderness Wound: incis - healing well  Lab Results: Recent Labs    01/17/21 0310 01/18/21 0130  WBC 10.3 8.4  HGB 7.2* 7.1*  HCT 21.1* 19.9*  PLT 241 203   BMET:  Recent Labs    01/17/21 0310 01/18/21 0130  NA 128* 129*  K 3.6 3.9  CL 98 98  CO2 22 21*  GLUCOSE 94 117*  BUN 6 6  CREATININE 0.42* 0.48  CALCIUM 8.1* 8.5*    PT/INR: No results for input(s): LABPROT, INR in the last 72 hours. ABG    Component Value Date/Time   PHART 7.497 (H) 01/15/2021 0350   HCO3 21.4 01/15/2021 0350   TCO2 24 06/20/2015 0150   ACIDBASEDEF 1.5 01/15/2021 0350   O2SAT 99.2 01/15/2021 0350   CBG (last 3)  No results for input(s): GLUCAP in the last 72 hours.  Meds Scheduled Meds: . sodium chloride   Intravenous Once  . bisacodyl  10 mg Oral Daily  . Chlorhexidine Gluconate Cloth  6 each Topical Q0600  . dexamethasone (DECADRON) injection  6 mg Intravenous Q24H  . enoxaparin (LOVENOX) injection  40 mg Subcutaneous Q24H  . feeding supplement  237 mL Oral BID BM   . folic acid  1 mg Oral Daily  . midodrine  5 mg Oral BID WC  . multivitamin with minerals  1 tablet Oral Daily  . mupirocin ointment  1 application Nasal BID  . nicotine  14 mg Transdermal Daily  . pantoprazole  40 mg Oral Q1200  . saccharomyces boulardii  250 mg Oral BID  . senna-docusate  1 tablet Oral QHS  . thiamine  100 mg Oral Daily   Continuous Infusions: . sodium chloride Stopped (01/15/21 1420)  . ampicillin-sulbactam (UNASYN) IV 3 g (01/18/21 0559)   PRN Meds:.alum & mag hydroxide-simeth, calcium carbonate, diphenhydrAMINE, guaiFENesin-dextromethorphan, ibuprofen, loperamide, methocarbamol, ondansetron (ZOFRAN) IV, oxyCODONE, traMADol  Xrays DG CHEST PORT 1 VIEW  Result Date: 01/17/2021 CLINICAL DATA:  Lung surgery follow-up EXAM: PORTABLE CHEST 1 VIEW COMPARISON:  Yesterday FINDINGS: Right-sided chest tubes in place. No visible pneumothorax at the apex. Small volume pneumothorax at the right base. Streaky atelectasis at the right base which appears similar. Clear left lung. Normal heart size. IMPRESSION: Similar degree of atelectasis and basal pneumothorax on the right. Electronically Signed   By: Monte Fantasia M.D.   On: 01/17/2021 07:31   DG CHEST PORT 1 VIEW  Result Date: 01/16/2021 CLINICAL DATA:  Status post lung surgery. Evaluate chest tubes. Evaluate pneumothorax. EXAM: PORTABLE  CHEST 1 VIEW COMPARISON:  Chest x-rays dated 01/15/2021 and 01/14/2021. Chest CT dated 01/10/2021. FINDINGS: RIGHT-sided chest tubes are stable in position. Probable small residual pneumothorax at the RIGHT lung base and at the RIGHT lung apex. Stable atelectasis and/or small pleural effusion at the RIGHT lung base. LEFT lung is clear. Heart size and mediastinal contours are stable in size and configuration IMPRESSION: 1. Probable small residual pneumothorax at the RIGHT lung base and at the RIGHT lung apex. RIGHT-sided chest tubes are stable in position. 2. Stable atelectasis and/or small  pleural effusion at the RIGHT lung base. Electronically Signed   By: Franki Cabot M.D.   On: 01/16/2021 10:35   Results for orders placed or performed during the hospital encounter of 01/09/21  MRSA PCR Screening     Status: Abnormal   Collection Time: 01/13/21  8:57 PM   Specimen: Nasal Mucosa; Nasopharyngeal  Result Value Ref Range Status   MRSA by PCR POSITIVE (A) NEGATIVE Final    Comment:        The GeneXpert MRSA Assay (FDA approved for NASAL specimens only), is one component of a comprehensive MRSA colonization surveillance program. It is not intended to diagnose MRSA infection nor to guide or monitor treatment for MRSA infections. RESULT CALLED TO, READ BACK BY AND VERIFIED WITH: Gearldine Shown RN 01/14/21 0211 JDW Performed at [facility], [place] [address], [place], [place] [address]   Aerobic/Anaerobic Culture (surgical/deep wound)     Status: None (Preliminary result)   Collection Time: 01/14/21 10:57 AM   Specimen: Pleural, Right; Body Fluid  Result Value Ref Range Status   Specimen Description TISSUE  Final   Special Requests RIGHT PLEURAL PEEL  Final   Gram Stain   Final    ABUNDANT WBC PRESENT, PREDOMINANTLY PMN RARE GRAM POSITIVE COCCI    Culture   Final    RARE STREPTOCOCCUS INTERMEDIUS SUSCEPTIBILITIES TO FOLLOW Performed at [facility], [place] [address], [place], [place] [address]    Report Status PENDING  Incomplete  Aerobic/Anaerobic Culture (surgical/deep wound)     Status: None (Preliminary result)   Collection Time: 01/14/21 11:08 AM   Specimen: PATH Other; Tissue  Result Value Ref Range Status   Specimen Description PLEURAL  Final   Special Requests RIGHT SIDE A  Final   Gram Stain   Final    ABUNDANT WBC PRESENT, PREDOMINANTLY PMN NO ORGANISMS SEEN    Culture   Final    NO GROWTH 3 DAYS Performed at [facility], [place] [address], [place], [place] [address]    Report Status PENDING  Incomplete   Assessment/Plan: S/P  Procedure(s) (LRB): VIDEO ASSISTED THORACOSCOPY (VATS)/DRAIN EMPYEMA (Right) DECORTICATION (Right)  1 afeb, VSS, sinus rhythm 2 sats good on RA 3 CXR stable in appearance 4 CT 70 cc /24 hours- poss d/c tube soon 5 no leukocytosis- on unasyn, gr + cocci, cx neg so far 6 H/H stable 7 hyponatremia is stable, slightly improved, slightly acidotic, normal renal fxn 8 routine pulm toilet /rehab  LOS: 9 days    John Giovanni PA-C Pager [phone] 01/18/2021 Patient seen and examined agree with above Dc chest tube C/o inability to clear mucous- will start Mucinex, continue IS and flutter Will repeat CXR in AM- if Ok should be able to dc home  [place] C. Roxan Hockey, MD [facility] [phone]

## 2021-01-18 NOTE — Progress Notes (Signed)
[facility] PROGRESS NOTE   [name] ZYS:[id number] DOB: 1976-07-04 DOA: 01/09/2021  PCP: [name] Reusing, NP  Brief History/Interval Summary: 45 y.o. female with medical history significant of alcohol abuse, tobacco use, history of cervical cancer, atopic dermatitis admitted to [facility] on 01/04/2021 for feeling weak for about 2 weeks.  She was treated for sepsis and acute hypoxic respiratory failure secondary to COVID-19 infection with superimposed bacterial infection.  She was treated with steroids and a 5-day course of remdesivir.  Work-up also revealed a right moderate pleural effusion.  Pleural fluid culture grew Streptococcus intermedius.  Patient underwent ultrasound-guided thoracentesis on 1/18 and thoracostomy under CT guidance by IR on 1/21.  Right chest tube draining mucopurulent debris.  ID was consulted and recommended antibiotics for at least 3 to 4 weeks.  She was sent to [facility] for evaluation by CT surgery for more definitive treatment of multiloculated effusion.    Reason for Visit: Empyema  Consultants: Cardiothoracic surgery  Procedures: Right chest tube placement, VATS  Antibiotics: Anti-infectives (From admission, onward)   Start     Dose/Rate Route Frequency Ordered Stop   01/14/21 0600  vancomycin (VANCOCIN) IVPB 1000 mg/200 mL premix        1,000 mg 200 mL/hr over 60 Minutes Intravenous On call to O.R. 01/13/21 1453 01/14/21 1145   01/09/21 2345  Ampicillin-Sulbactam (UNASYN) 3 g in sodium chloride 0.9 % 100 mL IVPB        3 g 200 mL/hr over 30 Minutes Intravenous Every 6 hours 01/09/21 2247        Subjective/Interval History: Patient continues to complain of pain at the site of chest tube insertion site - moderately well controlled overnight. Otherwise denies fevers,chills, headache, nausea, vomiting.  Assessment/Plan:  Right-sided empyema s/p pigtail catheter 1/21; VATS 1/27 Possible removal of second (last) chest tube today  per CTSx Pleural fluid cultures grew Streptococcus intermedius. Patient underwent thoracentesis on 1/18 followed by a pigtail catheter placement under CT guidance by interventional radiology on 1/21. Cardiothoracic surgery is following.  VATS 01/14/21 Patient was also seen by infectious disease recommending antibiotics for 3 to 4 weeks. Currently on Unasyn with expected transition to Augmentin depending on clinical progress/chest tube removal removal. Continue probiotics  Acute blood loss anemia on chronic anemia(unspecified subtype), multifactorial, stabilizing  Patient noted to have chronic anemia -unspecified.   Initial anemia 1/25 status post chest tube washout s/p 1u PRBC - stabilized at that time Postoperatively hemoglobin downtrending slightly to 7.1 on 28th, remains asymptomatic -scant serosanguineous fluid in the chest tube today  Pneumonia due to COVID-19 Completed 5-day course of Remdesivir. She has been weaned off of oxygen. Currently saturating normal on room air. Remains on dexamethasone 6 mg daily. Continue incentive spirometer. Based on x-ray findings from the time of admission and her symptoms it looks like her symptoms are primarily due to empyema.  She has a positive test result from 1/17.  Does not need more than 10 days of isolation. She is off covid isolation as of 1/28.  Hyponatremia/hypomagnesemia/hypokalemia, improving Likely hypovolemic vs SIADH from lung infection Sodium was 114 on 1/17.  Labs stabilizing now - likely at baseline (around 128-130 2 years ago)  History of alcohol abuse No evidence for withdrawal. Continue thiamine folate multivitamins. CIWA protocol likely unnecessary given outside of window at this point.  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 Continues on midodrine.  Cortisol stimulation test shows mild improvement in cortisol (but not to expected levels) -  concern for secondary adrenal insufficiency in the setting of  infection.  Abnormal thyroid function tests TSH noted to be 7.9 with a free T4 which is normal at 0.85.  To be rechecked in the outpatient setting in a few weeks.    Positive hep C antibody/mild transaminitis Patient also with history of alcohol use.  HCV titer and genotype pending.   Ultrasound of the abdomen showed diffusely increased hepatic echogenicity compatible with hepatic steatosis.  Developing cirrhotic changes were also noted.  Cholelithiasis and biliary sludge was also noted.  No cholecystitis was noted. HIV screening was negative.   Will need outpatient consultation with ID as well as gastroenterology.  Moderate protein calorie malnutrition Encourage oral intake.  Tobacco abuse Nicotine patch.   DVT Prophylaxis: Lovenox Code Status: Full code Family Communication: Discussed with the patient. Disposition Plan: Hopefully return home in the next day or two per CT Sx.  Status is: Inpatient  Remains inpatient appropriate because:IV treatments appropriate due to intensity of illness or inability to take PO and Inpatient level of care appropriate due to severity of illness   Dispo: The patient is from: Home              Anticipated d/c is to: Home              Anticipated d/c date is: 24-48h              Patient currently is not medically stable to d/c.   Difficult to place patient No    Medications:  Scheduled: . sodium chloride   Intravenous Once  . bisacodyl  10 mg Oral Daily  . Chlorhexidine Gluconate Cloth  6 each Topical Q0600  . dexamethasone (DECADRON) injection  6 mg Intravenous Q24H  . enoxaparin (LOVENOX) injection  40 mg Subcutaneous Q24H  . feeding supplement  237 mL Oral BID BM  . folic acid  1 mg Oral Daily  . midodrine  5 mg Oral BID WC  . multivitamin with minerals  1 tablet Oral Daily  . mupirocin ointment  1 application Nasal BID  . nicotine  14 mg Transdermal Daily  . pantoprazole  40 mg Oral Q1200  . saccharomyces boulardii  250 mg Oral BID   . senna-docusate  1 tablet Oral QHS  . thiamine  100 mg Oral Daily   Continuous: . sodium chloride Stopped (01/15/21 1420)  . ampicillin-sulbactam (UNASYN) IV 3 g (01/18/21 0559)   [id number] & mag hydroxide-simeth, calcium carbonate, diphenhydrAMINE, guaiFENesin-dextromethorphan, ibuprofen, loperamide, methocarbamol, ondansetron (ZOFRAN) IV, oxyCODONE, traMADol   Objective:  Vital Signs  Vitals:   01/17/21 1712 01/17/21 2012 01/18/21 0005 01/18/21 0443  BP: (!) 116/101 113/64 119/71 118/82  Pulse: 65 71 71 75  Resp: 17 18 14 18   Temp: 98.4 F (36.9 C) 98.3 F (36.8 C) 98.8 F (37.1 C) 98.9 F (37.2 C)  TempSrc: Oral Oral Oral Oral  SpO2: 100% 98% 99% 100%  Weight:      Height:        Intake/Output Summary (Last 24 hours) at 01/18/2021 0753 Last data filed at 01/18/2021 0600 Gross per 24 hour  Intake -  Output 70 ml  Net -70 ml   Filed Weights   01/14/21 1156  Weight: 58.7 kg    General appearance: Awake alert.  In no distress Resp: Chest tube on the right - draining serosanguinous fluid; currently to water seal Cardio: S1-S2 is normal regular.  No S3-S4.  No rubs murmurs or bruit GI: Abdomen  is soft.  Nontender nondistended.  Bowel sounds are present normal.  No masses organomegaly Extremities: No edema.  Full range of motion of lower extremities. Neurologic: Alert and oriented x3.  No focal neurological deficits.   Lab Results:  Data Reviewed: I have personally reviewed following labs and imaging studies  CBC: Recent Labs  Lab 01/14/21 0248 01/15/21 0346 01/16/21 0152 01/17/21 0310 01/18/21 0130  WBC 14.1* 16.0* 12.6* 10.3 8.4  HGB 7.9* 7.1* 7.1* 7.2* 7.1*  HCT 22.5* 19.8* 20.9* 21.1* 19.9*  MCV 98.3 98.5 102.0* 101.0* 100.0  PLT 257 239 236 241 431    Basic Metabolic Panel: Recent Labs  Lab 01/13/21 0829 01/14/21 0248 01/15/21 0346 01/16/21 0152 01/17/21 0310 01/18/21 0130  NA 129* 127* 127* 127* 128* 129*  K 4.2 3.8 3.4* 3.3* 3.6 3.9   CL 99 99 98 100 98 98  CO2 21* 20* 19* 21* 22 21*  GLUCOSE 76 93 125* 84 94 117*  BUN 7 7 8 6 6 6   CREATININE 0.57 0.51 0.54 0.54 0.42* 0.48  CALCIUM 8.2* 7.8* 7.2* 7.6* 8.1* 8.5*  MG 1.5*  --   --   --   --   --     GFR: Estimated Creatinine Clearance: 70.5 mL/min (by C-G formula based on SCr of 0.48 mg/dL).  Liver Function Tests: Recent Labs  Lab 01/14/21 0248 01/15/21 0346 01/16/21 0152 01/17/21 0310 01/18/21 0130  AST 92* 65* 59* 62* 83*  ALT 61* 51* 47* 53* 69*  ALKPHOS 50 46 44 48 47  BILITOT 1.0 0.7 0.6 0.7 0.5  PROT 5.5* 4.9* 5.0* 5.6* 5.8*  ALBUMIN 2.1* 2.1* 2.0* 2.2* 2.3*      CBG: No results for input(s): GLUCAP in the last 168 hours.     Recent Results (from the past 240 hour(s))  MRSA PCR Screening     Status: Abnormal   Collection Time: 01/13/21  8:57 PM   Specimen: Nasal Mucosa; Nasopharyngeal  Result Value Ref Range Status   MRSA by PCR POSITIVE (A) NEGATIVE Final    Comment:        The GeneXpert MRSA Assay (FDA approved for NASAL specimens only), is one component of a comprehensive MRSA colonization surveillance program. It is not intended to diagnose MRSA infection nor to guide or monitor treatment for MRSA infections. RESULT CALLED TO, READ BACK BY AND VERIFIED WITH: Gearldine Shown RN 01/14/21 0211 JDW Performed at [facility], [place] [address], [place], [place] [address]   Aerobic/Anaerobic Culture (surgical/deep wound)     Status: None (Preliminary result)   Collection Time: 01/14/21 10:57 AM   Specimen: Pleural, Right; Body Fluid  Result Value Ref Range Status   Specimen Description TISSUE  Final   Special Requests RIGHT PLEURAL PEEL  Final   Gram Stain   Final    ABUNDANT WBC PRESENT, PREDOMINANTLY PMN RARE GRAM POSITIVE COCCI    Culture   Final    RARE STREPTOCOCCUS INTERMEDIUS SUSCEPTIBILITIES TO FOLLOW Performed at [facility], [place] [address], [place], [place] [address]    Report Status PENDING  Incomplete   Aerobic/Anaerobic Culture (surgical/deep wound)     Status: None (Preliminary result)   Collection Time: 01/14/21 11:08 AM   Specimen: PATH Other; Tissue  Result Value Ref Range Status   Specimen Description PLEURAL  Final   Special Requests RIGHT SIDE A  Final   Gram Stain   Final    ABUNDANT WBC PRESENT, PREDOMINANTLY PMN NO ORGANISMS  SEEN    Culture   Final    NO GROWTH 3 DAYS Performed at [facility], [place] [address], [place], [place] [address]    Report Status PENDING  Incomplete      Radiology Studies: DG CHEST PORT 1 VIEW  Result Date: 01/17/2021 CLINICAL DATA:  Lung surgery follow-up EXAM: PORTABLE CHEST 1 VIEW COMPARISON:  Yesterday FINDINGS: Right-sided chest tubes in place. No visible pneumothorax at the apex. Small volume pneumothorax at the right base. Streaky atelectasis at the right base which appears similar. Clear left lung. Normal heart size. IMPRESSION: Similar degree of atelectasis and basal pneumothorax on the right. Electronically Signed   By: Monte Fantasia M.D.   On: 01/17/2021 07:31   DG CHEST PORT 1 VIEW  Result Date: 01/16/2021 CLINICAL DATA:  Status post lung surgery. Evaluate chest tubes. Evaluate pneumothorax. EXAM: PORTABLE CHEST 1 VIEW COMPARISON:  Chest x-rays dated 01/15/2021 and 01/14/2021. Chest CT dated 01/10/2021. FINDINGS: RIGHT-sided chest tubes are stable in position. Probable small residual pneumothorax at the RIGHT lung base and at the RIGHT lung apex. Stable atelectasis and/or small pleural effusion at the RIGHT lung base. LEFT lung is clear. Heart size and mediastinal contours are stable in size and configuration IMPRESSION: 1. Probable small residual pneumothorax at the RIGHT lung base and at the RIGHT lung apex. RIGHT-sided chest tubes are stable in position. 2. Stable atelectasis and/or small pleural effusion at the RIGHT lung base. Electronically Signed   By: Franki Cabot M.D.   On: 01/16/2021 10:35       LOS: 9 days   [facility] Pager on [email or web address]  01/18/2021, 7:53 AM

## 2021-01-19 ENCOUNTER — Inpatient Hospital Stay (HOSPITAL_COMMUNITY): Payer: Self-pay

## 2021-01-19 DIAGNOSIS — U071 COVID-19: Secondary | ICD-10-CM | POA: Diagnosis not present

## 2021-01-19 DIAGNOSIS — J869 Pyothorax without fistula: Secondary | ICD-10-CM | POA: Diagnosis not present

## 2021-01-19 LAB — COMPREHENSIVE METABOLIC PANEL
ALT: 80 U/L — ABNORMAL HIGH (ref 0–44)
AST: 84 U/L — ABNORMAL HIGH (ref 15–41)
Albumin: 2.5 g/dL — ABNORMAL LOW (ref 3.5–5.0)
Alkaline Phosphatase: 51 U/L (ref 38–126)
Anion gap: 8 (ref 5–15)
BUN: 6 mg/dL (ref 6–20)
CO2: 23 mmol/L (ref 22–32)
Calcium: 8.6 mg/dL — ABNORMAL LOW (ref 8.9–10.3)
Chloride: 95 mmol/L — ABNORMAL LOW (ref 98–111)
Creatinine, Ser: 0.36 mg/dL — ABNORMAL LOW (ref 0.44–1.00)
GFR, Estimated: >60 mL/min (ref >60)
Glucose, Bld: 103 mg/dL — ABNORMAL HIGH (ref 70–99)
Potassium: 4.1 mmol/L (ref 3.5–5.1)
Sodium: 126 mmol/L — ABNORMAL LOW (ref 135–145)
Total Bilirubin: 0.3 mg/dL (ref 0.3–1.2)
Total Protein: 6.4 g/dL — ABNORMAL LOW (ref 6.5–8.1)

## 2021-01-19 LAB — AEROBIC/ANAEROBIC CULTURE W GRAM STAIN (SURGICAL/DEEP WOUND): Culture: NO GROWTH

## 2021-01-19 LAB — CBC
HCT: 23.3 % — ABNORMAL LOW (ref 36.0–46.0)
Hemoglobin: 7.8 g/dL — ABNORMAL LOW (ref 12.0–15.0)
MCH: 34.7 pg — ABNORMAL HIGH (ref 26.0–34.0)
MCHC: 33.5 g/dL (ref 30.0–36.0)
MCV: 103.6 fL — ABNORMAL HIGH (ref 80.0–100.0)
Platelets: 237 10*3/uL (ref 150–400)
RBC: 2.25 MIL/uL — ABNORMAL LOW (ref 3.87–5.11)
RDW: 16.8 % — ABNORMAL HIGH (ref 11.5–15.5)
WBC: 8.4 10*3/uL (ref 4.0–10.5)
nRBC: 0 % (ref 0.0–0.2)

## 2021-01-19 MED ORDER — OXYCODONE HCL 5 MG PO TABS: 5.0000 mg | ORAL_TABLET | ORAL | 0 refills | Status: AC | PRN

## 2021-01-19 MED ORDER — AMOXICILLIN-POT CLAVULANATE 875-125 MG PO TABS: 1.0000 | ORAL_TABLET | Freq: Two times a day (BID) | ORAL | 0 refills | Status: AC

## 2021-01-19 MED ORDER — BISACODYL 5 MG PO TBEC: 10.0000 mg | DELAYED_RELEASE_TABLET | Freq: Every day | ORAL | 0 refills | Status: DC

## 2021-01-19 MED ORDER — PANTOPRAZOLE SODIUM 40 MG PO TBEC: 40.0000 mg | DELAYED_RELEASE_TABLET | Freq: Every day | ORAL | 0 refills | Status: DC

## 2021-01-19 MED ORDER — SENNOSIDES-DOCUSATE SODIUM 8.6-50 MG PO TABS: 1.0000 | ORAL_TABLET | Freq: Every day | ORAL | 0 refills | Status: DC

## 2021-01-19 NOTE — Progress Notes (Addendum)
[address]       [employer] [address]             [phone]      5 Days Post-Op Procedure(s) (LRB): VIDEO ASSISTED THORACOSCOPY (VATS)/DRAIN EMPYEMA (Right) DECORTICATION (Right) Subjective: conts to feel better  Objective: Vital signs in last 24 hours: Temp:  [98.2 F (36.8 C)-99.1 F (37.3 C)] 99.1 F (37.3 C) (02/01 0448) Pulse Rate:  [61-81] 61 (02/01 0448) Cardiac Rhythm: Normal sinus rhythm (01/31 1900) Resp:  [17-18] 18 (02/01 0448) BP: (112-124)/(49-73) 123/73 (02/01 0448) SpO2:  [100 %] 100 % (02/01 0447)  Hemodynamic parameters for last 24 hours:    Intake/Output from previous day: 01/31 0701 - 02/01 0700 In: 0  Out: 1410 [Urine:1400; Chest Tube:10] Intake/Output this shift: No intake/output data recorded.  General appearance: alert, cooperative and no distress Heart: regular rate and rhythm Lungs: clear to auscultation bilaterally Abdomen: benign Extremities: no edema or calf tenderness Wound: incis healing well  Lab Results: Recent Labs    01/18/21 0130 01/19/21 0050  WBC 8.4 8.4  HGB 7.1* 7.8*  HCT 19.9* 23.3*  PLT 203 237   BMET:  Recent Labs    01/18/21 0130 01/19/21 0050  NA 129* 126*  K 3.9 4.1  CL 98 95*  CO2 21* 23  GLUCOSE 117* 103*  BUN 6 6  CREATININE 0.48 0.36*  CALCIUM 8.5* 8.6*    PT/INR: No results for input(s): LABPROT, INR in the last 72 hours. ABG    Component Value Date/Time   PHART 7.497 (H) 01/15/2021 0350   HCO3 21.4 01/15/2021 0350   TCO2 24 06/20/2015 0150   ACIDBASEDEF 1.5 01/15/2021 0350   O2SAT 99.2 01/15/2021 0350   CBG (last 3)  No results for input(s): GLUCAP in the last 72 hours.  Meds Scheduled Meds: . sodium chloride   Intravenous Once  . bisacodyl  10 mg Oral Daily  . dexamethasone (DECADRON) injection  6 mg Intravenous Q24H  . enoxaparin (LOVENOX) injection  40 mg Subcutaneous Q24H  . feeding supplement  237 mL Oral BID BM  . folic acid  1 mg Oral Daily  .  guaiFENesin  600 mg Oral BID  . midodrine  5 mg Oral BID WC  . multivitamin with minerals  1 tablet Oral Daily  . nicotine  14 mg Transdermal Daily  . pantoprazole  40 mg Oral Q1200  . saccharomyces boulardii  250 mg Oral BID  . senna-docusate  1 tablet Oral QHS  . thiamine  100 mg Oral Daily   Continuous Infusions: . sodium chloride Stopped (01/15/21 1420)  . ampicillin-sulbactam (UNASYN) IV 3 g (01/19/21 0617)   PRN Meds:.alum & mag hydroxide-simeth, calcium carbonate, diphenhydrAMINE, guaiFENesin-dextromethorphan, ibuprofen, loperamide, methocarbamol, ondansetron (ZOFRAN) IV, oxyCODONE, traMADol  Xrays DG CHEST PORT 1 VIEW  Result Date: 01/18/2021 CLINICAL DATA:  Chest tube removal, and by Terrence Dupont EXAM: PORTABLE CHEST 1 VIEW COMPARISON:  01/17/2021 FINDINGS: Since prior examination, the medial right large bore chest tube has been removed. Lateral right large bore chest tube remains in place. Slight interval right-sided volume loss with right basilar discoid atelectasis again noted. No pneumothorax or significant pleural fluid. Left lung is clear. Cardiac size within normal limits. Pulmonary vascularity is normal. IMPRESSION: Interval removal of 1 of the 2 large bore right chest tubes. No pneumothorax or pleural effusion. Electronically Signed   By: Fidela Salisbury MD   On: 01/18/2021 08:10    Results for orders  placed or performed during the hospital encounter of 01/09/21  MRSA PCR Screening     Status: Abnormal   Collection Time: 01/13/21  8:57 PM   Specimen: Nasal Mucosa; Nasopharyngeal  Result Value Ref Range Status   MRSA by PCR POSITIVE (A) NEGATIVE Final    Comment:        The GeneXpert MRSA Assay (FDA approved for NASAL specimens only), is one component of a comprehensive MRSA colonization surveillance program. It is not intended to diagnose MRSA infection nor to guide or monitor treatment for MRSA infections. RESULT CALLED TO, READ BACK BY AND VERIFIED WITH: Gearldine Shown RN  01/14/21 0211 JDW Performed at [facility], [place] [address], [place], [place] [address]   Aerobic/Anaerobic Culture (surgical/deep wound)     Status: None (Preliminary result)   Collection Time: 01/14/21 10:57 AM   Specimen: Pleural, Right; Body Fluid  Result Value Ref Range Status   Specimen Description TISSUE  Final   Special Requests RIGHT PLEURAL PEEL  Final   Gram Stain   Final    ABUNDANT WBC PRESENT, PREDOMINANTLY PMN RARE GRAM POSITIVE COCCI Performed at [facility], [address] [place], [place] [address]    Culture RARE STREPTOCOCCUS INTERMEDIUS  Final   Report Status PENDING  Incomplete   Organism ID, Bacteria STREPTOCOCCUS INTERMEDIUS  Final      Susceptibility   Streptococcus intermedius - MIC*    PENICILLIN <=0.06 SENSITIVE Sensitive     CEFTRIAXONE 0.25 SENSITIVE Sensitive     ERYTHROMYCIN >=8 RESISTANT Resistant     LEVOFLOXACIN 0.5 SENSITIVE Sensitive     VANCOMYCIN 0.25 SENSITIVE Sensitive     * RARE STREPTOCOCCUS INTERMEDIUS  Aerobic/Anaerobic Culture (surgical/deep wound)     Status: None (Preliminary result)   Collection Time: 01/14/21 11:08 AM   Specimen: PATH Other; Tissue  Result Value Ref Range Status   Specimen Description PLEURAL  Final   Special Requests RIGHT SIDE A  Final   Gram Stain   Final    ABUNDANT WBC PRESENT, PREDOMINANTLY PMN NO ORGANISMS SEEN    Culture   Final    NO GROWTH 4 DAYS NO ANAEROBES ISOLATED; CULTURE IN PROGRESS FOR 5 DAYS Performed at [facility], [address], [place], [place] [address]    Report Status PENDING  Incomplete    Assessment/Plan: S/P Procedure(s) (LRB): VIDEO ASSISTED THORACOSCOPY (VATS)/DRAIN EMPYEMA (Right) DECORTICATION (Right)  1 Tmax 99.1, VSS 2 sats good on RA 3 H/H improved trend 4 CXR stable appearance, no pntx 5 no leukocytosis- cont unasyn, transition to augmentin po at d/c 6 normal renal fxn 7 hyponatremia a little worse, close to baseline however 8 stable for d/c  from surgical perspective 9 appt in AVS for surgical f/u   LOS: 10 days    John Giovanni PA-C Pager [phone] 01/19/2021 Ok for dc from our standpoint. Plan per medical team  Revonda Standard. Roxan Hockey, MD [facility] [phone]

## 2021-01-19 NOTE — TOC Initial Note (Signed)
Transition of Care [facility]) - Initial/Assessment Note    Patient Details  Name: EDYTH GLOMB MRN: [id number] Date of Birth: 09/24/1976  Transition of Care [facility]) CM/SW Contact:    Bethena Roys, RN Phone Number: 01/19/2021, 12:23 PM  Clinical Narrative: Risk for readmission assessment completed. Patient presented for fatigue and weakness. Prior to arrival patient was from home with significant other. Patient states she will return to her apartment in [place]. Case Manager spoke with patient and she uses the [facility] in [place]. Patient will schedule her hospital follow up appointment. Information is on the AVS. Patient was provided outpatient substance abuse resources. Patient has transportation home via private vehicle.           Expected Discharge Plan: Home/Self Care Barriers to Discharge: No Barriers Identified   Patient Goals and CMS Choice Patient states their goals for this hospitalization and ongoing recovery are:: to return home.   Choice offered to / list presented to : NA  Expected Discharge Plan and Services Expected Discharge Plan: Home/Self Care In-house Referral: NA Discharge Planning Services: CM Consult Post Acute Care Choice: NA Living arrangements for the past 2 months: Apartment Expected Discharge Date: 01/19/21               DME Arranged: N/A DME Agency: NA       HH Arranged: NA HH Agency: NA        Prior Living Arrangements/Services Living arrangements for the past 2 months: Apartment Lives with:: Significant Other Patient language and need for interpreter reviewed:: Yes Do you feel safe going back to the place where you live?: Yes      Need for Family Participation in Patient Care: Yes (Comment) Care giver support system in place?: Yes (comment)   Criminal Activity/Legal Involvement Pertinent to Current Situation/Hospitalization: No - Comment as needed  Activities of Daily Living Home Assistive Devices/Equipment:  None ADL Screening (condition at time of admission) Patient's cognitive ability adequate to safely complete daily activities?: Yes Is the patient deaf or have difficulty hearing?: Yes Does the patient have difficulty seeing, even when wearing glasses/contacts?: No Does the patient have difficulty concentrating, remembering, or making decisions?: No Patient able to express need for assistance with ADLs?: Yes Does the patient have difficulty dressing or bathing?: No Independently performs ADLs?: Yes (appropriate for developmental age) Does the patient have difficulty walking or climbing stairs?: Yes Weakness of Legs: Both Weakness of Arms/Hands: None  Emotional Assessment Appearance:: Appears stated age Attitude/Demeanor/Rapport: Engaged Affect (typically observed): Appropriate Orientation: : Oriented to Self,Oriented to Place,Oriented to  Time,Oriented to Situation Alcohol / Substance Use: Not Applicable Psych Involvement: No (comment)  Admission diagnosis:  Empyema ([place]) [J86.9] Patient Active Problem List   Diagnosis Date Noted  . Empyema ([place]) 01/09/2021  . Anemia 01/09/2021  . COVID-19 virus infection 01/09/2021  . Acute hypoxemic respiratory failure due to COVID-19 ([place]) 01/04/2021  . Edema of right lower leg 05/28/2020  . Abnormal laboratory test result 02/14/2020  . History of allergy 01/16/2020  . Essential hypertension 01/16/2020  . Smoking 01/16/2020  . Health care maintenance 01/08/2020  . Alcohol abuse   . Alcohol withdrawal delirium ([place]) 07/17/2015  . Ascites 07/21/2014  . Alcohol withdrawal ([place]) 07/09/2014  . Alcoholism /alcohol abuse ([place]) 07/09/2014  . Abnormal computed tomography angiography (CTA) of abdomen and pelvis 07/09/2014  . Malnutrition of moderate degree ([place]) 07/09/2014  . Liver function study, abnormal 04/08/2014  . ETOH abuse 04/08/2014  . Palpitations - evaluated  by Dr. Cathie Olden in the past 02/20/2014  . Anxiety 04/02/2012  . Drug dependence  ([place]) 03/18/2012  . 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   PCP:  Langston Reusing, NP Pharmacy:   [facility] [place], [place] [address] [place] [address] Phone: [phone] Fax: [phone]  [id number] - [place], [place] - [address] AT [place] [address] [place] [address] Phone: [phone] Fax: [id number] [place], [place] - [place] [address] [place] [address] Phone: [phone] Fax: [phone]  Readmission Risk Interventions Readmission Risk Prevention Plan 01/19/2021  Transportation Screening Complete  PCP or Specialist Appt within 3-5 Days Complete  HRI or Home Care Consult Complete  Social Work Consult for [place] Planning/Counseling Complete  Palliative Care Screening Not Applicable  Medication Review Press photographer) Complete  Some recent data might be hidden

## 2021-01-19 NOTE — Plan of Care (Signed)
  Problem: Education: Goal: Knowledge of General Education information will improve Description: Including pain rating scale, medication(s)/side effects and non-pharmacologic comfort measures Outcome: Adequate for Discharge   Problem: Health Behavior/Discharge Planning: Goal: Ability to manage health-related needs will improve Outcome: Adequate for Discharge   Problem: Clinical Measurements: Goal: Ability to maintain clinical measurements within normal limits will improve Outcome: Adequate for Discharge Goal: Will remain free from infection Outcome: Adequate for Discharge Goal: Diagnostic test results will improve Outcome: Adequate for Discharge Goal: Respiratory complications will improve Outcome: Adequate for Discharge Goal: Cardiovascular complication will be avoided Outcome: Adequate for Discharge   Problem: Activity: Goal: Risk for activity intolerance will decrease Outcome: Adequate for Discharge   Problem: Nutrition: Goal: Adequate nutrition will be maintained Outcome: Adequate for Discharge   Problem: Coping: Goal: Level of anxiety will decrease Outcome: Adequate for Discharge   Problem: Elimination: Goal: Will not experience complications related to bowel motility Outcome: Adequate for Discharge Goal: Will not experience complications related to urinary retention Outcome: Adequate for Discharge   Problem: Pain Managment: Goal: General experience of comfort will improve Outcome: Adequate for Discharge   Problem: Safety: Goal: Ability to remain free from injury will improve Outcome: Adequate for Discharge   Problem: Skin Integrity: Goal: Risk for impaired skin integrity will decrease Outcome: Adequate for Discharge   Problem: Education: Goal: Knowledge of risk factors and measures for prevention of condition will improve Outcome: Adequate for Discharge   Problem: Coping: Goal: Psychosocial and spiritual needs will be supported Outcome: Adequate for  Discharge   Problem: Respiratory: Goal: Will maintain a patent airway Outcome: Adequate for Discharge Goal: Complications related to the disease process, condition or treatment will be avoided or minimized Outcome: Adequate for Discharge   Problem: Acute Rehab PT Goals(only PT should resolve) Goal: Patient Will Transfer Sit To/From Stand Outcome: Adequate for Discharge Goal: Pt Will Ambulate Outcome: Adequate for Discharge Goal: Pt Will Go Up/Down Stairs Outcome: Adequate for Discharge   Problem: Increased Nutrient Needs (NI-5.1) Goal: Food and/or nutrient delivery Description: Individualized approach for food/nutrient provision. Outcome: Adequate for Discharge   Problem: Acute Rehab OT Goals (only OT should resolve) Goal: Pt. Will Perform Lower Body Dressing Outcome: Adequate for Discharge Goal: OT Additional ADL Goal #1 Outcome: Adequate for Discharge   Problem: Acute Rehab OT Goals (only OT should resolve) Goal: Pt/Caregiver Will Perform Home Exercise Program Outcome: Adequate for Discharge

## 2021-01-19 NOTE — Plan of Care (Signed)

## 2021-01-19 NOTE — Discharge Summary (Signed)
Physician Discharge Summary  [name] [id number] DOB: 30-Nov-1976 DOA: 01/09/2021  PCP: Langston Reusing, NP  Admit date: 01/09/2021 Discharge date: 01/19/2021  Admitted From: Home Disposition:  Home  Recommendations for Outpatient Follow-up:  1. Follow up with PCP in 1-2 weeks 2. Please obtain BMP/CBC in one week 3. Please follow up on the following pending results:  Home Health: None  Equipment/Devices: None  Discharge Condition: Stable  CODE STATUS: Full  Diet recommendation: As tolerated    Brief/Interim Summary: 45 y.o.femalewith medical history significant ofalcohol abuse, tobacco use, history of cervical cancer, atopic dermatitis admitted to [facility] on 01/04/2021 for feeling weak for about 2 weeks. She was treated for sepsis and acute hypoxic respiratory failure secondary to COVID-19 infection with superimposed bacterial infection. She was treated with steroids and a 5-day course of remdesivir. Work-up also revealed a right moderate pleural effusion. Pleural fluid culture grew Streptococcus intermedius. Patient underwent ultrasound-guided thoracentesis on 1/18 and thoracostomy under CT guidance by IR on 1/21. Right chest tube draining mucopurulent debris. ID was consulted and recommended antibiotics for at least 3 to 4 weeks. She was sent to [facility] for evaluation by CT surgery for more definitive treatment of multiloculated effusion.  Right-sided empyema status post pigtail catheter 1/21; VATS 1/27 with subsequent additional chest tube placement Chest tubes removed 1/30 and 1/31. Pleural fluid cultures grew Streptococcus intermedius. Patient underwent thoracentesis on 1/18 followed by a pigtail catheter placement under CT guidance by interventional radiology on 1/21. Transition from Unasyn to Augmentin for 4 weeks total  Acute blood loss anemia on chronic anemia (unspecified subtype), multifactorial, improving Resolved post-operatively  Pneumonia  due to COVID-19, resolved Completed 5-day course of Remdesivir. She has been weaned off of oxygen.  Based on x-ray findings from the time of admission and her symptoms it looks like her symptoms are primarily due to empyema.  She has a positive test result from 1/17.  Does not need more than 10 days of isolation.  Hyponatremia/hypomagnesemia/hypokalemia, improving Likely hypovolemic vs SIADH from lung infection Sodium was 114 on 1/17.  Labs stabilizing now - likely at baseline (around 128-130 2 years ago per chart review)  History of alcohol abuse No evidence for withdrawal. Continue thiamine folate multivitamins.  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 Continue on midodrine.  Cortisol stimulation test shows mild improvement in cortisol (but not to expected levels) - concern for secondary adrenal insufficiency in the setting of infection.  Abnormal thyroid function tests TSH noted to be 7.9 with a free T4 which is normal at 0.85.  To be rechecked in the outpatient setting in a few weeks post resolution of infection.    Positive hep C antibody/mild transaminitis Patient also with history of alcohol use.   HIV screening was negative.   Will need outpatient follow-up with ID as well as gastroenterology.  Discharge Diagnoses:  Principal Problem:   Empyema ([place]) Active Problems:   Alcohol abuse   Smoking   Anemia   COVID-19 virus infection    Discharge Instructions  Discharge Instructions    Call MD for:  difficulty breathing, headache or visual disturbances   Complete by: As directed    Call MD for:  redness, tenderness, or signs of infection (pain, swelling, redness, odor or green/yellow discharge around incision site)   Complete by: As directed    Call MD for:  temperature >100.4   Complete by: As directed    Diet - low sodium heart healthy   Complete by:  As directed    Discharge wound care:   Complete by: As directed    Per surgery    Increase activity slowly   Complete by: As directed      Allergies as of 01/19/2021      Reactions   Acetaminophen    Intolerance, hx of tylenol OD       Medication List    TAKE these medications   amoxicillin-clavulanate 875-125 MG tablet Commonly known as: Augmentin Take 1 tablet by mouth 2 (two) times daily for 14 days.   bisacodyl 5 MG EC tablet Commonly known as: DULCOLAX Take 2 tablets (10 mg total) by mouth daily.   dexamethasone 10 MG/ML injection Commonly known as: DECADRON Inject 0.6 mLs (6 mg total) into the vein daily.   enoxaparin 40 MG/0.4ML injection Commonly known as: LOVENOX Inject 0.4 mLs (40 mg total) into the skin daily.   midodrine 5 MG tablet Commonly known as: PROAMATINE Take 1 tablet (5 mg total) by mouth 2 (two) times daily with a meal.   oxyCODONE 5 MG immediate release tablet Commonly known as: Oxy IR/ROXICODONE Take 1-2 tablets (5-10 mg total) by mouth every 4 (four) hours as needed for up to 7 days for moderate pain or severe pain.   pantoprazole 40 MG tablet Commonly known as: PROTONIX Take 1 tablet (40 mg total) by mouth daily.   senna-docusate 8.6-50 MG tablet Commonly known as: Senokot-S Take 1 tablet by mouth at bedtime.            Discharge Care Instructions  (From admission, onward)         Start     Ordered   01/19/21 0000  Discharge wound care:       Comments: Per surgery   01/19/21 1118          Follow-up Information    Melrose Nakayama, MD Follow up on 01/18/2021.   Specialty: Cardiothoracic Surgery Why: Appointment is at   , please get CXR 30 min prior to your appointment at [facility] located on first floor of our office building Contact information: [place] [address] [phone]        Langston Reusing, NP. Schedule an appointment as soon as possible for a visit in 1 week(s).   Specialty: Gerontology Why: [facility]-  Contact information: [place] [address] [phone]              Allergies  Allergen Reactions  . Acetaminophen     Intolerance, hx of tylenol OD     Consultations: Cardiothoracic surgery, infectious disease  Procedures/Studies: DG Chest 1 View  Result Date: 01/12/2021 CLINICAL DATA:  Chest tube.  COVID. EXAM: CHEST  1 VIEW COMPARISON:  CT 01/10/2021.  Chest x-ray 01/10/2021. FINDINGS: Right chest tube noted over the right chest base. Interim progression of known right hydropneumothorax. Pneumothorax is approximately 30%. Persistent right base atelectasis/infiltrate. Heart size normal. No acute bony abnormality identified. IMPRESSION: Right chest tube noted over the right chest base. Interim progression of known right hydropneumothorax. Pneumothorax is approximately 30%. Persistent right base atelectasis/infiltrate. These results will be called to the ordering clinician or representative by the Radiologist Assistant, and communication documented in the PACS or [employer]. Electronically Signed   By: Marcello Moores  Register   On: 01/12/2021 05:53   DG Chest 1 View  Result Date: 01/09/2021 CLINICAL DATA:  Pleural effusion EXAM: CHEST  1 VIEW COMPARISON:  01/08/2021 FINDINGS: Right basilar lateral pigtail chest tube has been inserted. Small right pleural effusion persists. Right basilar consolidation is again noted. Small right apical pneumothorax is again seen, grossly stable since prior examination. Left lung is clear. No pneumothorax or pleural effusion on the left. Cardiac size is within normal limits. Pulmonary vascularity is normal. IMPRESSION: Interval right chest tube placement. Persistent right basilar consolidation and small right pleural effusion. Stable right apical pneumothorax. Electronically Signed   By: Fidela Salisbury MD   On: 01/09/2021 06:05   DG Chest 2 View  Result Date: 01/19/2021 CLINICAL DATA:  Empyema.  Chest tube removal. EXAM: CHEST - 2 VIEW COMPARISON:   January 18, 2021 FINDINGS: Interval removal of the right chest tube. No visible pneumothorax or sizable pleural effusion. Similar right basilar atelectasis. Left lung remains clear. Cardiac silhouette is within normal limits and unchanged. IMPRESSION: 1. Interval removal of the right chest tube. No visible pneumothorax or sizable pleural effusion. 2. Similar right basilar atelectasis. Electronically Signed   By: Margaretha Sheffield MD   On: 01/19/2021 08:10   CT HEAD WO CONTRAST  Result Date: 01/04/2021 CLINICAL DATA:  Right-sided weakness EXAM: CT HEAD WITHOUT CONTRAST TECHNIQUE: Contiguous axial images were obtained from the base of the skull through the vertex without intravenous contrast. COMPARISON:  None. FINDINGS: Brain: There is mild diffuse atrophy for age. Prominence of the cisterna magna is an anatomic variant. There is no intracranial mass, hemorrhage, extra-axial fluid collection, or midline shift. Brain parenchyma appears unremarkable; no acute infarct is demonstrable. Vascular: No hyperdense vessel. No appreciable vascular calcification. Skull: Bony calvarium appears intact. Sinuses/Orbits: Visualized paranasal sinuses are clear. Visualized orbits appear symmetric bilaterally. Other: Mastoid air cells are clear. IMPRESSION: Mild atrophy for age. Brain parenchyma appears unremarkable. No mass or hemorrhage. Electronically Signed   By: Lowella Grip III M.D.   On: 01/04/2021 14:05   CT CHEST WO CONTRAST  Result Date: 01/10/2021 CLINICAL DATA:  Empyema. COVID-19 infection. Recent catheter placed in right-sided hydropneumothorax. Alcohol abuse. EXAM: CT CHEST WITHOUT CONTRAST TECHNIQUE: Multidetector CT imaging of the chest was performed following the standard protocol without IV contrast. COMPARISON:  Plain film of earlier today.  Chest CT 01/04/2021. FINDINGS: Cardiovascular: Normal aortic caliber. Normal heart size, without pericardial effusion. Mediastinum/Nodes: No mediastinal or definite  hilar adenopathy, given limitations of unenhanced CT. Lungs/Pleura: Small right-sided pneumothorax, with the visceral pleural line maximally 1.5 cm chest wall on 63/6. Pigtail catheter within the pleural space. Minimal pleural fluid inferiorly and dependently. Multi septated right pleural material likely relates to the clinical history of empyema, including on 64/6. Significantly improved right-sided aeration since the prior CT. Right middle lobe collapse/consolidation remains. Mild, left greater than right upper lobe and peripheral predominant ground-glass opacities. Example posterior left upper lobe on 42/6 and posterolateral right upper lobe on 51/6. Upper Abdomen: Moderate hepatic steatosis. Lateral segment left liver lobe prominence. Normal imaged portions of the spleen, stomach, pancreas, adrenal glands, kidneys. Musculoskeletal: No acute osseous abnormality. Mild to moderate right hemidiaphragm elevation. IMPRESSION: 1. Small complex right-sided hydropneumothorax with pigtail catheter within. Improved right-sided aeration with right middle lobe collapse/consolidation remaining. Primarily felt to represent atelectasis. Pneumonia cannot be excluded. 2. Mild upper lobe and peripheral predominant ground-glass opacities which given the clinical history are favored to represent COVID-19 pneumonia. 3. Hepatic steatosis. Suspicion of mild cirrhosis, especially given the clinical history of alcohol abuse. Electronically Signed   By: Abigail Miyamoto M.D.   On: 01/10/2021 18:56   CT Chest W  Contrast  Result Date: 01/04/2021 CLINICAL DATA:  Right-sided pleural effusion, shortness of breath, leukocytosis. EXAM: CT CHEST WITH CONTRAST TECHNIQUE: Multidetector CT imaging of the chest was performed during intravenous contrast administration. CONTRAST:  61mL OMNIPAQUE IOHEXOL 300 MG/ML  SOLN COMPARISON:  Chest x-ray 01/04/2021, CT abdomen pelvis 08/15/2018. FINDINGS: Cardiovascular: Normal heart size. No significant  pericardial effusion. The thoracic aorta is normal in caliber. No atherosclerotic plaque of the thoracic aorta. No coronary artery calcifications. Mediastinum/Nodes: Prominent infra cardiac lymph nodes measuring up to 0.6 cm (2:110). No enlarged mediastinal, hilar, or axillary lymph nodes. Thyroid gland, trachea, and esophagus demonstrate no significant findings. Lungs/Pleura: Complete collapse of the right middle lobe and partial collapse of the right lower lobe. Within the collapsed right middle lobe, several rounded hypodensities are noted (2:90). No cavitary lesion identified. No associated surrounding enhancing/thick-walled capsule. Nonspecific calcification within the collapsed right lower lobe. Passive atelectasis of the right upper lobe. Couple scattered peribronchovascular ground-glass airspace opacities within the left upper lobe. Suggestion of round atelectasis at the left base (2:123). Small to moderate volume right pleural effusion. No definite pleural thickening or enhancement to suggest empyema. No pleural effusion on the left. No pneumothorax bilaterally. Upper Abdomen: The hepatic parenchyma is diffusely hypodense compared to the splenic parenchyma consistent with fatty infiltration. Otherwise no acute abnormality. Musculoskeletal: No chest wall abnormality. No acute or significant osseous findings. IMPRESSION: 1. Small to moderate right pleural effusion with associated complete collapse of right middle lobe and partial collapse of right lower lobe. Several rounded hypodensities within the collapsed right middle lobe likely represents infection/inflammation with no definite findings to suggest abscess formation. Recommend short-term repeat CT chest with intravenous contrast for evaluation of resolution and exclude underlying malignancy. 2. Few scattered ground-glass airspace opacities within the of the left lower lobe could represent atelectasis versus infection/inflammation. Electronically Signed    By: Iven Finn M.D.   On: 01/04/2021 17:28   DG CHEST PORT 1 VIEW  Result Date: 01/18/2021 CLINICAL DATA:  Chest tube removal, and by Terrence Dupont EXAM: PORTABLE CHEST 1 VIEW COMPARISON:  01/17/2021 FINDINGS: Since prior examination, the medial right large bore chest tube has been removed. Lateral right large bore chest tube remains in place. Slight interval right-sided volume loss with right basilar discoid atelectasis again noted. No pneumothorax or significant pleural fluid. Left lung is clear. Cardiac size within normal limits. Pulmonary vascularity is normal. IMPRESSION: Interval removal of 1 of the 2 large bore right chest tubes. No pneumothorax or pleural effusion. Electronically Signed   By: Fidela Salisbury MD   On: 01/18/2021 08:10   DG CHEST PORT 1 VIEW  Result Date: 01/17/2021 CLINICAL DATA:  Lung surgery follow-up EXAM: PORTABLE CHEST 1 VIEW COMPARISON:  Yesterday FINDINGS: Right-sided chest tubes in place. No visible pneumothorax at the apex. Small volume pneumothorax at the right base. Streaky atelectasis at the right base which appears similar. Clear left lung. Normal heart size. IMPRESSION: Similar degree of atelectasis and basal pneumothorax on the right. Electronically Signed   By: Monte Fantasia M.D.   On: 01/17/2021 07:31   DG CHEST PORT 1 VIEW  Result Date: 01/16/2021 CLINICAL DATA:  Status post lung surgery. Evaluate chest tubes. Evaluate pneumothorax. EXAM: PORTABLE CHEST 1 VIEW COMPARISON:  Chest x-rays dated 01/15/2021 and 01/14/2021. Chest CT dated 01/10/2021. FINDINGS: RIGHT-sided chest tubes are stable in position. Probable small residual pneumothorax at the RIGHT lung base and at the RIGHT lung apex. Stable atelectasis and/or small pleural effusion at the RIGHT lung  base. LEFT lung is clear. Heart size and mediastinal contours are stable in size and configuration IMPRESSION: 1. Probable small residual pneumothorax at the RIGHT lung base and at the RIGHT lung apex. RIGHT-sided  chest tubes are stable in position. 2. Stable atelectasis and/or small pleural effusion at the RIGHT lung base. Electronically Signed   By: Franki Cabot M.D.   On: 01/16/2021 10:35   DG CHEST PORT 1 VIEW  Result Date: 01/15/2021 CLINICAL DATA:  45 year old female with history of empyema, chest tube. EXAM: PORTABLE CHEST 1 VIEW COMPARISON:  Portable chest 01/14/2021 and earlier. FINDINGS: Portable AP upright view at 0515 hours. 2 right chest tubes now in place and stable since 1531 hours yesterday. Small volume right chest wall and neck subcutaneous gas has increased. No definite pneumothorax. Streaky right lung base opacity is stable. Mediastinal contours remain within normal limits. Left lung remains clear when allowing for portable technique. Paucity of bowel gas in the upper abdomen. No acute osseous abnormality identified. IMPRESSION: 1. Stable right chest tubes with no definite pneumothorax. Improved right lung base ventilation from yesterday morning with residual patchy opacity. 2. Left lung remains clear. Electronically Signed   By: Genevie Ann M.D.   On: 01/15/2021 06:10   DG Chest Port 1 View  Result Date: 01/14/2021 CLINICAL DATA:  Recent empyema EXAM: PORTABLE CHEST 1 VIEW COMPARISON:  January 14, 2021 study obtained earlier in the day FINDINGS: Pigtail catheter has been removed from the right side with 2 chest tubes now present on the right. There is trace right basilar pneumothorax currently. Atelectatic change is noted in the right base. Lungs elsewhere clear. Heart size and pulmonary vascularity are normal. No adenopathy. No bone lesions. IMPRESSION: Chest tubes present on the right with minimal right base pneumothorax. Other components of pneumothorax have resolved. There is right base atelectasis. No edema or consolidation. Heart size normal. Electronically Signed   By: Lowella Grip III M.D.   On: 01/14/2021 15:55   DG Chest Port 1 View  Result Date: 01/14/2021 CLINICAL DATA:   45 year old female with history of empyema, chest tube placed. EXAM: PORTABLE CHEST 1 VIEW COMPARISON:  Portable chest 01/13/2021 and earlier. FINDINGS: Portable AP upright view at 0518 hours. Stable pigtail right chest tube near the lung base. Persistent although smaller residual right side hydropneumothorax. Less pleural air now. Patchy and confluent opacity persists at the right costophrenic angle. Stable cardiac size and mediastinal contours. Stable, negative left lung. Visualized tracheal air column is within normal limits. No acute osseous abnormality identified. Negative visible bowel gas pattern. IMPRESSION: 1. Decreased right hydropneumothorax since yesterday, small to moderate residual. Stable right chest tube. 2. No new cardiopulmonary abnormality. Electronically Signed   By: Genevie Ann M.D.   On: 01/14/2021 06:24   DG Chest Port 1 View  Result Date: 01/13/2021 CLINICAL DATA:  Empyema.  Chest tube. EXAM: PORTABLE CHEST 1 VIEW COMPARISON:  01/12/2021. FINDINGS: Right chest tube in stable position. Right hydropneumothorax again noted. Slight interim improvement from prior exam. Persistent right base atelectasis/infiltrate. Heart size stable. IMPRESSION: Right chest tube in stable position. Right hydropneumothorax again noted. Slight interim improvement from prior exam. Persistent right base atelectasis/infiltrate. Electronically Signed   By: Marcello Moores  Register   On: 01/13/2021 07:57   DG CHEST PORT 1 VIEW  Result Date: 01/10/2021 CLINICAL DATA:  History of pleural effusion.  COVID-19. EXAM: PORTABLE CHEST 1 VIEW COMPARISON:  Chest radiograph 01/09/2021. FINDINGS: Stable cardiac and mediastinal contours. Right chest tube remains in place.  Redemonstrated consolidation within the right lower hemithorax. Mild interval increase in size of small right apical and lateral pneumothorax. Left lung is clear. IMPRESSION: Mild interval increase in size of small right apical and lateral pneumothorax. Right chest tube  remains in place. These results will be called to the ordering clinician or representative by the Radiologist Assistant, and communication documented in the PACS or [employer]. Electronically Signed   By: Lovey Newcomer M.D.   On: 01/10/2021 08:38   DG Chest Port 1 View  Result Date: 01/08/2021 CLINICAL DATA:  Empyema.  COVID-19 positive. EXAM: PORTABLE CHEST 1 VIEW COMPARISON:  January 06, 2021. FINDINGS: The heart size and mediastinal contours are within normal limits. Stable small right apical pneumothorax is noted. Left lung is clear. Slightly decreased loculated right pleural effusion is noted with associated right basilar atelectasis or infiltrate. The visualized skeletal structures are unremarkable. IMPRESSION: Right hydropneumothorax is again noted with associated right basilar atelectasis or infiltrate. No pneumothorax is noted. Electronically Signed   By: Marijo Conception M.D.   On: 01/08/2021 09:52   DG Chest Port 1 View  Result Date: 01/06/2021 CLINICAL DATA:  Post thoracentesis EXAM: PORTABLE CHEST 1 VIEW COMPARISON:  01/05/2021 FINDINGS: Similar moderate right pleural effusion with small pneumothorax. Adjacent right lung atelectasis. Stable left lung aeration. Stable cardiomediastinal contours. IMPRESSION: Similar right hydropneumothorax. Electronically Signed   By: Macy Mis M.D.   On: 01/06/2021 08:15   DG Chest Port 1 View  Result Date: 01/05/2021 CLINICAL DATA:  Status post thoracentesis. EXAM: PORTABLE CHEST 1 VIEW COMPARISON:  Chest x-ray 01/04/2021 FINDINGS: Status post right-sided thoracentesis with a evacuation of a moderate amount of pleural fluid. There is still a moderate-sized, likely loculated pleural effusion. There is a small postprocedural pneumothorax noted. The left lung remains relatively clear. IMPRESSION: Status post right-sided thoracentesis with residual loculated pleural fluid and a small postprocedural pneumothorax. Electronically Signed   By: Marijo Sanes  M.D.   On: 01/05/2021 16:32   DG Chest Portable 1 View  Result Date: 01/04/2021 CLINICAL DATA:  Weakness EXAM: PORTABLE CHEST 1 VIEW COMPARISON:  11/17/2014 FINDINGS: Minimal airspace disease at the peripheral left lung base. At least moderate size right pleural effusion which appears loculated. Consolidation at the right middle lobe and right base. Cardiomediastinal silhouette is obscured. No pneumothorax. IMPRESSION: 1. At least moderate size right pleural effusion which appears loculated. Consolidation at the right middle lobe and right base may reflect atelectasis or pneumonia. 2. Minimal airspace disease at the peripheral left lung base. Electronically Signed   By: Donavan Foil M.D.   On: 01/04/2021 16:20   CT IMAGE GUIDED DRAINAGE BY PERCUTANEOUS CATHETER  Result Date: 01/08/2021 CLINICAL DATA:  Empyema, planned decortication EXAM: CT GUIDED CHEST DRAIN PLACEMENT ANESTHESIA/SEDATION: Intravenous versed 2mg  were administered for anxiolysis during continuous monitoring of the patient's level of consciousness and physiological / cardiorespiratory status by the radiology RN. PROCEDURE: The procedure, risks, benefits, and alternatives were explained to the patient. Questions regarding the procedure were encouraged and answered. The patient understands and consents to the procedure. Select axial scans through the thorax were obtained. The loculated right pleural effusion was localized and an appropriate skin entry site was determined and marked. The operative field was prepped with chlorhexidinein a sterile fashion, and a sterile drape was applied covering the operative field. A sterile gown and sterile gloves were used for the procedure. Local anesthesia was provided with 1% Lidocaine. Under CT fluoroscopic guidance, a 7 cm 5 [place]  sheath needle advanced into the pleural space. Amplatz guidewire advanced easily, its position confirmed on CT. Tract dilated to facilitate placement of a 14 French  pigtail drain catheter, placed with into the dominant dependent aspect of the collection. Catheter position confirmed on CT. Catheter was secured externally with 0 Prolene suture and StatLock and placed to Pleur-evac -20 cm H2O drainage. Purulent material returned. The patient tolerated the procedure well. COMPLICATIONS: None immediate FINDINGS: Multiloculated right hydropneumothorax was localized. 14 French pigtail drain catheter placed in the dominant dependent component. IMPRESSION: Technically successful CT-guided chest tube placemenT. Electronically Signed   By: Lucrezia Europe M.D.   On: 01/08/2021 13:47   US Abdomen Limited RUQ (LIVER/GB)  Result Date: 01/07/2021 CLINICAL DATA:  Transaminitis, steatohepatitis EXAM: ULTRASOUND ABDOMEN LIMITED RIGHT UPPER QUADRANT COMPARISON:  Ultrasound 07/22/2014, CT 08/15/2018 FINDINGS: Gallbladder: Top-normal gallbladder distension. No gallbladder wall thickening. Gallbladder contains some echogenic biliary sludge and few echogenic, shadowing gallstones measuring up to 3.7 mm in diameter. Sonographic Percell Miller sign is reportedly negative. Common bile duct: Diameter: 6 mm, upper limits normal. Liver: Diffusely increased hepatic echogenicity with loss of definition of the portal triads and diminished posterior through transmission compatible with hepatic steatosis. Lobular hepatic surface contours could reflect some developing cirrhotic changes as well. No visible hepatic lesions. No intrahepatic biliary ductal dilatation is evident. Portal vein is patent on color Doppler imaging with normal direction of blood flow towards the liver. Other: Small volume perihepatic ascites and a right pleural effusion noted. IMPRESSION: 1. Diffusely increased hepatic echogenicity with loss of definition of the portal triads and diminished posterior through transmission. Findings are compatible with hepatic steatosis. 2. Lobular hepatic surface contours may reflect some developing cirrhotic  changes. Particularly with small volume of perihepatic ascites. 3. Right pleural effusion. 4. Cholelithiasis and biliary sludge without sonographic evidence of acute cholecystitis. Electronically Signed   By: Lovena Le M.D.   On: 01/07/2021 02:18   US THORACENTESIS ASP PLEURAL SPACE W/IMG GUIDE  Result Date: 01/05/2021 INDICATION: Patient with history of palpitations, alcohol abuse, cervical cancer, presented to ED with weakness and shortness of breath. Request is for therapeutic and diagnostic right-sided thoracentesis EXAM: ULTRASOUND GUIDED THERAPEUTIC AND DIAGNOSTIC RIGHT SIDED THORACENTESIS MEDICATIONS: Lidocaine 1% 10 mL COMPLICATIONS: None immediate. PROCEDURE: An ultrasound guided thoracentesis was thoroughly discussed with the patient and questions answered. The benefits, risks, alternatives and complications were also discussed. The patient understands and wishes to proceed with the procedure. Written consent was obtained. Ultrasound was performed to localize and mark an adequate pocket of fluid in the right chest. The area was then prepped and draped in the normal sterile fashion. 1% Lidocaine was used for local anesthesia. Under ultrasound guidance a 6 Fr Safe-T-Centesis catheter was introduced. Thoracentesis was performed. The catheter was removed and a dressing applied. FINDINGS: A total of approximately 200 mL of thick yellow fluid was removed. The procedure was terminated due to patient's inability to sit still in decompensation of oxygen saturation rate. Samples were sent to the laboratory as requested by the clinical team. IMPRESSION: Successful ultrasound guided right-sided therapeutic and diagnostic thoracentesis yielding 200 mL of pleural fluid. Read by: Rushie Nyhan, NP Electronically Signed   By: Miachel Roux M.D.   On: 01/05/2021 16:23     Subjective: No acute issues or events overnight denies nausea vomiting diarrhea constipation headache fevers or chills   Discharge  Exam: Vitals:   01/19/21 0755 01/19/21 1155  BP: (!) 104/58 114/90  Pulse: 83 69  Resp: 16 18  Temp: 98.6 F (37 C) 99 F (37.2 C)  SpO2: 100%    Vitals:   01/19/21 0447 01/19/21 0448 01/19/21 0755 01/19/21 1155  BP: 123/73 123/73 (!) 104/58 114/90  Pulse: 61 61 83 69  Resp: 18 18 16 18   Temp: 99.1 F (37.3 C) 99.1 F (37.3 C) 98.6 F (37 C) 99 F (37.2 C)  TempSrc: Oral  Oral Oral  SpO2: 100%  100%   Weight:      Height:        General: Pt is alert, awake, not in acute distress Cardiovascular: RRR, S1/S2 +, no rubs, no gallops Respiratory: CTA bilaterally, no wheezing, no rhonchi Abdominal: Soft, NT, ND, bowel sounds + Extremities: no edema, no cyanosis    The results of significant diagnostics from this hospitalization (including imaging, microbiology, ancillary and laboratory) are listed below for reference.     Microbiology: Recent Results (from the past 240 hour(s))  MRSA PCR Screening     Status: Abnormal   Collection Time: 01/13/21  8:57 PM   Specimen: Nasal Mucosa; Nasopharyngeal  Result Value Ref Range Status   MRSA by PCR POSITIVE (A) NEGATIVE Final    Comment:        The GeneXpert MRSA Assay (FDA approved for NASAL specimens only), is one component of a comprehensive MRSA colonization surveillance program. It is not intended to diagnose MRSA infection nor to guide or monitor treatment for MRSA infections. RESULT CALLED TO, READ BACK BY AND VERIFIED WITH: Gearldine Shown RN 01/14/21 0211 JDW Performed at [facility], [place] [address], [place], [place] [address]   Aerobic/Anaerobic Culture (surgical/deep wound)     Status: None   Collection Time: 01/14/21 10:57 AM   Specimen: Pleural, Right; Body Fluid  Result Value Ref Range Status   Specimen Description TISSUE  Final   Special Requests RIGHT PLEURAL PEEL  Final   Gram Stain   Final    ABUNDANT WBC PRESENT, PREDOMINANTLY PMN RARE GRAM POSITIVE COCCI    Culture   Final    RARE  STREPTOCOCCUS INTERMEDIUS NO ANAEROBES ISOLATED Performed at [facility], [address] [place], [place] [address]    Report Status 01/19/2021 FINAL  Final   Organism ID, Bacteria STREPTOCOCCUS INTERMEDIUS  Final      Susceptibility   Streptococcus intermedius - MIC*    PENICILLIN <=0.06 SENSITIVE Sensitive     CEFTRIAXONE 0.25 SENSITIVE Sensitive     ERYTHROMYCIN >=8 RESISTANT Resistant     LEVOFLOXACIN 0.5 SENSITIVE Sensitive     VANCOMYCIN 0.25 SENSITIVE Sensitive     * RARE STREPTOCOCCUS INTERMEDIUS  Aerobic/Anaerobic Culture (surgical/deep wound)     Status: None   Collection Time: 01/14/21 11:08 AM   Specimen: PATH Other; Tissue  Result Value Ref Range Status   Specimen Description PLEURAL  Final   Special Requests RIGHT SIDE A  Final   Gram Stain   Final    ABUNDANT WBC PRESENT, PREDOMINANTLY PMN NO ORGANISMS SEEN    Culture   Final    No growth aerobically or anaerobically. Performed at [facility], [place] [address] [place], [place] [address]    Report Status 01/19/2021 FINAL  Final     Labs: BNP (last 3 results) No results for input(s): BNP in the last 8760 hours. Basic Metabolic Panel: Recent Labs  Lab 01/13/21 0829 01/14/21 0248 01/15/21 0346 01/16/21 0152 01/17/21 0310 01/18/21 0130 01/19/21 0050  NA 129*   < > 127* 127* 128*  129* 126*  K 4.2   < > 3.4* 3.3* 3.6 3.9 4.1  CL 99   < > 98 100 98 98 95*  CO2 21*   < > 19* 21* 22 21* 23  GLUCOSE 76   < > 125* 84 94 117* 103*  BUN 7   < > 8 6 6 6 6   CREATININE 0.57   < > 0.54 0.54 0.42* 0.48 0.36*  CALCIUM 8.2*   < > 7.2* 7.6* 8.1* 8.5* 8.6*  MG 1.5*  --   --   --   --   --   --    < > = values in this interval not displayed.   Liver Function Tests: Recent Labs  Lab 01/15/21 0346 01/16/21 0152 01/17/21 0310 01/18/21 0130 01/19/21 0050  AST 65* 59* 62* 83* 84*  ALT 51* 47* 53* 69* 80*  ALKPHOS 46 44 48 47 51  BILITOT 0.7 0.6 0.7 0.5 0.3  PROT 4.9* 5.0* 5.6* 5.8* 6.4*  ALBUMIN  2.1* 2.0* 2.2* 2.3* 2.5*   No results for input(s): LIPASE, AMYLASE in the last 168 hours. No results for input(s): AMMONIA in the last 168 hours. CBC: Recent Labs  Lab 01/15/21 0346 01/16/21 0152 01/17/21 0310 01/18/21 0130 01/19/21 0050  WBC 16.0* 12.6* 10.3 8.4 8.4  HGB 7.1* 7.1* 7.2* 7.1* 7.8*  HCT 19.8* 20.9* 21.1* 19.9* 23.3*  MCV 98.5 102.0* 101.0* 100.0 103.6*  PLT 239 236 241 203 237   Cardiac Enzymes: No results for input(s): CKTOTAL, CKMB, CKMBINDEX, TROPONINI in the last 168 hours. BNP: Invalid input(s): POCBNP CBG: No results for input(s): GLUCAP in the last 168 hours. D-Dimer No results for input(s): DDIMER in the last 72 hours. Hgb A1c No results for input(s): HGBA1C in the last 72 hours. Lipid Profile No results for input(s): CHOL, HDL, LDLCALC, TRIG, CHOLHDL, LDLDIRECT in the last 72 hours. Thyroid function studies No results for input(s): TSH, T4TOTAL, T3FREE, THYROIDAB in the last 72 hours.  Invalid input(s): FREET3 Anemia work up No results for input(s): VITAMINB12, FOLATE, FERRITIN, TIBC, IRON, RETICCTPCT in the last 72 hours. Urinalysis    Component Value Date/Time   COLORURINE YELLOW 01/13/2021 1727   APPEARANCEUR CLEAR 01/13/2021 1727   APPEARANCEUR Clear 02/05/2020 1229   LABSPEC 1.011 01/13/2021 1727   PHURINE 7.0 01/13/2021 1727   GLUCOSEU NEGATIVE 01/13/2021 1727   HGBUR NEGATIVE 01/13/2021 1727   BILIRUBINUR NEGATIVE 01/13/2021 1727   BILIRUBINUR Negative 02/05/2020 [place] 01/13/2021 1727   PROTEINUR NEGATIVE 01/13/2021 1727   UROBILINOGEN 0.2 05/05/2015 2350   NITRITE NEGATIVE 01/13/2021 1727   LEUKOCYTESUR NEGATIVE 01/13/2021 1727   Sepsis Labs Invalid input(s): PROCALCITONIN,  WBC,  LACTICIDVEN Microbiology Recent Results (from the past 240 hour(s))  MRSA PCR Screening     Status: Abnormal   Collection Time: 01/13/21  8:57 PM   Specimen: Nasal Mucosa; Nasopharyngeal  Result Value Ref Range Status   MRSA  by PCR POSITIVE (A) NEGATIVE Final    Comment:        The GeneXpert MRSA Assay (FDA approved for NASAL specimens only), is one component of a comprehensive MRSA colonization surveillance program. It is not intended to diagnose MRSA infection nor to guide or monitor treatment for MRSA infections. RESULT CALLED TO, READ BACK BY AND VERIFIED WITH: Gearldine Shown RN 01/14/21 0211 JDW Performed at [facility], [place] [address], [place], [place] [address]   Aerobic/Anaerobic Culture (surgical/deep wound)     Status:  None   Collection Time: 01/14/21 10:57 AM   Specimen: Pleural, Right; Body Fluid  Result Value Ref Range Status   Specimen Description TISSUE  Final   Special Requests RIGHT PLEURAL PEEL  Final   Gram Stain   Final    ABUNDANT WBC PRESENT, PREDOMINANTLY PMN RARE GRAM POSITIVE COCCI    Culture   Final    RARE STREPTOCOCCUS INTERMEDIUS NO ANAEROBES ISOLATED Performed at [facility], [address], [place], [place] [address]    Report Status 01/19/2021 FINAL  Final   Organism ID, Bacteria STREPTOCOCCUS INTERMEDIUS  Final      Susceptibility   Streptococcus intermedius - MIC*    PENICILLIN <=0.06 SENSITIVE Sensitive     CEFTRIAXONE 0.25 SENSITIVE Sensitive     ERYTHROMYCIN >=8 RESISTANT Resistant     LEVOFLOXACIN 0.5 SENSITIVE Sensitive     VANCOMYCIN 0.25 SENSITIVE Sensitive     * RARE STREPTOCOCCUS INTERMEDIUS  Aerobic/Anaerobic Culture (surgical/deep wound)     Status: None   Collection Time: 01/14/21 11:08 AM   Specimen: PATH Other; Tissue  Result Value Ref Range Status   Specimen Description PLEURAL  Final   Special Requests RIGHT SIDE A  Final   Gram Stain   Final    ABUNDANT WBC PRESENT, PREDOMINANTLY PMN NO ORGANISMS SEEN    Culture   Final    No growth aerobically or anaerobically. Performed at [facility], [place] [address] [place], [place] [address]    Report Status 01/19/2021 FINAL  Final     Time coordinating discharge: Over 30  minutes  SIGNED:   Little Ishikawa, DO [facility] 01/19/2021, 6:20 PM Pager   If 7PM-7AM, please contact night-coverage [email or web address]

## 2021-01-19 NOTE — Care Management (Signed)
01-19-21 1035 Case Manager spoke with patient regarding primary care provider. Patient uses the [facility] in [place]. Case Manager offered to schedule an appointment and patient sates she will call the office to schedule. Information placed on the AVS. No further needs at this time. Bethena Roys, RN,BSN Case Manager

## 2021-01-19 NOTE — Progress Notes (Signed)
Physical Therapy Treatment Patient Details Name: [name] MRN: [id number] DOB: [birth year] Today's Date: 01/19/2021    History of Present Illness 45 y.o. female with medical history significant of alcohol abuse, tobacco use, history of cervical cancer, atopic dermatitis admitted to [facility] on 01/04/2021 for feeling weak for about 2 weeks.  She was treated for sepsis and acute hypoxic respiratory failure secondary to COVID-19 infection with superimposed bacterial infection. [place] ultrasound-guided thoracentesis on 1/18 and thoracostomy under CT guidance by IR on 1/21. Underwent VATS procedure 01/14/21. Transitioned to water seal chest-tube Rt 1/29, R CT removed 1/31.    PT Comments    Pt received in supine, agreeable to therapy session with moderate encouragement and with good participation and tolerance for mobility. Pt able to perform gait training in hallway using RW and stair training x5 steps with handrails and up to Supervision, mostly for line management/activity pacing. Pt VSS, RR mildly increased to 31 but pt anxious throughout, likely her baseline. Pt instructed on importance of continuing to use IS and able to return demo proper technique with min cues. Pt continues to benefit from PT services to progress toward functional mobility goals. Continue to recommend home with increased PRN supervision/assist.   Follow Up Recommendations  No PT follow up     Equipment Recommendations  None recommended by PT (pt reports friend has RW she can use)    Recommendations for Other Services       Precautions / Restrictions Precautions Precautions: Other (comment) Precaution Comments: R chest tube removed 1/31, dressing Restrictions Weight Bearing Restrictions: No    Mobility  Bed Mobility Overal bed mobility: Modified Independent             General bed mobility comments: HOB elevated, assist for line mgmt  Transfers Overall transfer level: Modified independent   Transfers:  Sit to/from Stand           General transfer comment: from EOB<>RW, no difficulty  Ambulation/Gait Ambulation/Gait assistance: Supervision Gait Distance (Feet): 200 Feet Assistive device: Rolling walker (2 wheeled) Gait Pattern/deviations: Step-through pattern     General Gait Details: SpO2 93-100% on RA, HR 88 bpm; no LOB using RW, good use of AD; resp rate increased to 31 rpm during mobility/stairs   Stairs Stairs: Yes Stairs assistance: Supervision Stair Management: Step to pattern;Two rails;Forwards Number of Stairs: 5 General stair comments: pt ascended/descended 3 steps then 2 steps with B rails and Supervision for line mgmt, no difficulty or LOB   Wheelchair Mobility    Modified Rankin (Stroke Patients Only)       Balance Overall balance assessment: No apparent balance deficits (not formally assessed)                                          Cognition Arousal/Alertness: Awake/alert Behavior During Therapy: WFL for tasks assessed/performed;Anxious;Impulsive Overall Cognitive Status: Within Functional Limits for tasks assessed                                 General Comments: pt anxious but participatory with encouragement; at times internally distracted      Exercises Other Exercises Other Exercises: IS x5 reps with cues for technique; encouraged hourly use after DC    General Comments General comments (skin integrity, edema, etc.): pt denies dizziness, VSS on RA throughout, dressing to  R CT removal site clean/dry/intact      Pertinent Vitals/Pain Pain Assessment: Faces Faces Pain Scale: Hurts a little bit Pain Location: R chest wall at where CT was removed Pain Descriptors / Indicators: Tender;Sore Pain Intervention(s): Monitored during session;Repositioned;Premedicated before session (cues for manual splinting vs pillow splinting while performing IS, pt defers at this time)    Home Living                       Prior Function            PT Goals (current goals can now be found in the care plan section) Acute Rehab PT Goals Patient Stated Goal: get better PT Goal Formulation: With patient Time For Goal Achievement: 01/24/21 Potential to Achieve Goals: Good Progress towards PT goals: Progressing toward goals    Frequency    Min 3X/week      PT Plan Current plan remains appropriate    Co-evaluation              AM-PAC PT "6 Clicks" Mobility   Outcome Measure  Help needed turning from your back to your side while in a flat bed without using bedrails?: None Help needed moving from lying on your back to sitting on the side of a flat bed without using bedrails?: None Help needed moving to and from a bed to a chair (including a wheelchair)?: None Help needed standing up from a chair using your arms (e.g., wheelchair or bedside chair)?: A Little Help needed to walk in hospital room?: A Little Help needed climbing 3-5 steps with a railing? : A Little 6 Click Score: 21    End of Session Equipment Utilized During Treatment: Gait belt Activity Tolerance: Patient tolerated treatment well Patient left: in bed;with call bell/phone within reach Nurse Communication: Mobility status PT Visit Diagnosis: Other abnormalities of gait and mobility (R26.89)     Time: 8185-6314 PT Time Calculation (min) (ACUTE ONLY): 19 min  Charges:  $Gait Training: 8-22 mins                     Sarahmarie Leavey P., PTA Acute Rehabilitation Services Pager: [phone] Office: [place] 01/19/2021, 11:26 AM

## 2021-01-20 ENCOUNTER — Other Ambulatory Visit: Payer: Self-pay | Admitting: Gerontology

## 2021-01-20 DIAGNOSIS — Z09 Encounter for follow-up examination after completed treatment for conditions other than malignant neoplasm: Secondary | ICD-10-CM

## 2021-01-26 ENCOUNTER — Other Ambulatory Visit: Payer: Self-pay

## 2021-01-26 ENCOUNTER — Other Ambulatory Visit: Payer: Self-pay | Admitting: Gerontology

## 2021-01-26 DIAGNOSIS — Z09 Encounter for follow-up examination after completed treatment for conditions other than malignant neoplasm: Secondary | ICD-10-CM

## 2021-01-26 DIAGNOSIS — R002 Palpitations: Secondary | ICD-10-CM

## 2021-01-26 MED ORDER — PROPRANOLOL HCL 40 MG PO TABS: 40.0000 mg | ORAL_TABLET | Freq: Every day | ORAL | 0 refills | Status: DC

## 2021-01-27 ENCOUNTER — Encounter (INDEPENDENT_AMBULATORY_CARE_PROVIDER_SITE_OTHER): Payer: Self-pay

## 2021-01-27 ENCOUNTER — Other Ambulatory Visit: Payer: Self-pay

## 2021-01-27 DIAGNOSIS — Z4802 Encounter for removal of sutures: Secondary | ICD-10-CM

## 2021-01-27 LAB — CBC WITH DIFFERENTIAL/PLATELET
Basophils Absolute: 0 10*3/uL (ref 0.0–0.2)
Basos: 1 %
EOS (ABSOLUTE): 0.2 10*3/uL (ref 0.0–0.4)
Eos: 3 %
Hematocrit: 25.2 % — ABNORMAL LOW (ref 34.0–46.6)
Hemoglobin: 8.5 g/dL — ABNORMAL LOW (ref 11.1–15.9)
Immature Grans (Abs): 0 10*3/uL (ref 0.0–0.1)
Immature Granulocytes: 1 %
Lymphocytes Absolute: 1.9 10*3/uL (ref 0.7–3.1)
Lymphs: 30 %
MCH: 34.8 pg — ABNORMAL HIGH (ref 26.6–33.0)
MCHC: 33.7 g/dL (ref 31.5–35.7)
MCV: 103 fL — ABNORMAL HIGH (ref 79–97)
Monocytes Absolute: 0.6 10*3/uL (ref 0.1–0.9)
Monocytes: 9 %
Neutrophils Absolute: 3.7 10*3/uL (ref 1.4–7.0)
Neutrophils: 56 %
Platelets: 318 10*3/uL (ref 150–450)
RBC: 2.44 x10E6/uL — CL (ref 3.77–5.28)
RDW: 14.5 % (ref 11.7–15.4)
WBC: 6.5 10*3/uL (ref 3.4–10.8)

## 2021-01-27 LAB — BASIC METABOLIC PANEL
BUN/Creatinine Ratio: 12 (ref 9–23)
BUN: 5 mg/dL — ABNORMAL LOW (ref 6–24)
CO2: 26 mmol/L (ref 20–29)
Calcium: 8.6 mg/dL — ABNORMAL LOW (ref 8.7–10.2)
Chloride: 96 mmol/L (ref 96–106)
Creatinine, Ser: 0.42 mg/dL — ABNORMAL LOW (ref 0.57–1.00)
GFR calc Af Amer: 142 mL/min/{1.73_m2} (ref >59)
GFR calc non Af Amer: 123 mL/min/{1.73_m2} (ref >59)
Glucose: 88 mg/dL (ref 65–99)
Potassium: 5 mmol/L (ref 3.5–5.2)
Sodium: 133 mmol/L — ABNORMAL LOW (ref 134–144)

## 2021-01-27 LAB — TSH: TSH: 2.01 u[IU]/mL (ref 0.450–4.500)

## 2021-02-01 ENCOUNTER — Other Ambulatory Visit: Payer: Self-pay | Admitting: Thoracic Surgery (Cardiothoracic Vascular Surgery)

## 2021-02-01 DIAGNOSIS — J869 Pyothorax without fistula: Secondary | ICD-10-CM

## 2021-02-02 ENCOUNTER — Encounter: Payer: Self-pay | Admitting: Thoracic Surgery (Cardiothoracic Vascular Surgery)

## 2021-02-04 ENCOUNTER — Encounter: Payer: Self-pay | Admitting: Gerontology

## 2021-02-04 ENCOUNTER — Ambulatory Visit: Payer: Self-pay | Admitting: Gerontology

## 2021-02-04 ENCOUNTER — Other Ambulatory Visit: Payer: Self-pay

## 2021-02-04 VITALS — BP 128/86 | HR 89 | Temp 97.3°F | Resp 16 | Wt 140.9 lb

## 2021-02-04 DIAGNOSIS — R002 Palpitations: Secondary | ICD-10-CM

## 2021-02-04 DIAGNOSIS — I10 Essential (primary) hypertension: Secondary | ICD-10-CM

## 2021-02-04 DIAGNOSIS — Z09 Encounter for follow-up examination after completed treatment for conditions other than malignant neoplasm: Secondary | ICD-10-CM | POA: Insufficient documentation

## 2021-02-04 DIAGNOSIS — R609 Edema, unspecified: Secondary | ICD-10-CM

## 2021-02-04 DIAGNOSIS — I872 Venous insufficiency (chronic) (peripheral): Secondary | ICD-10-CM | POA: Insufficient documentation

## 2021-02-04 DIAGNOSIS — F17200 Nicotine dependence, unspecified, uncomplicated: Secondary | ICD-10-CM

## 2021-02-04 LAB — FUNGUS CULTURE WITH STAIN

## 2021-02-04 LAB — FUNGAL ORGANISM REFLEX

## 2021-02-04 LAB — FUNGUS CULTURE RESULT

## 2021-02-04 MED ORDER — PROPRANOLOL HCL 40 MG PO TABS
40.0000 mg | ORAL_TABLET | Freq: Every day | ORAL | 2 refills | Status: DC
Start: 2021-02-04 — End: 2021-04-01

## 2021-02-04 MED ORDER — TRIAMCINOLONE ACETONIDE 0.1 % EX CREA: 1.0000 "application " | TOPICAL_CREAM | Freq: Two times a day (BID) | CUTANEOUS | 0 refills | Status: DC

## 2021-02-04 MED ORDER — FUROSEMIDE 20 MG PO TABS: 20.0000 mg | ORAL_TABLET | Freq: Every day | ORAL | 0 refills | Status: DC

## 2021-02-04 NOTE — Patient Instructions (Signed)
Edema  Edema is when you have too much fluid in your body or under your skin. Edema may make your legs, feet, and ankles swell up. Swelling is also common in looser tissues, like around your eyes. This is a common condition. It gets more common as you get older. There are many possible causes of edema. Eating too much salt (sodium) and being on your feet or sitting for a long time can cause edema in your legs, feet, and ankles. Hot weather may make edema worse. Edema is usually painless. Your skin may look swollen or shiny. Follow these instructions at home:  Keep the swollen body part raised (elevated) above the level of your heart when you are sitting or lying down.  Do not sit still or stand for a long time.  Do not wear tight clothes. Do not wear garters on your upper legs.  Exercise your legs. This can help the swelling go down.  Wear elastic bandages or support stockings as told by your doctor.  Eat a low-salt (low-sodium) diet to reduce fluid as told by your doctor.  Depending on the cause of your swelling, you may need to limit how much fluid you drink (fluid restriction).  Take over-the-counter and prescription medicines only as told by your doctor. Contact a doctor if:  Treatment is not working.  You have heart, liver, or kidney disease and have symptoms of edema.  You have sudden and unexplained weight gain. Get help right away if:  You have shortness of breath or chest pain.  You cannot breathe when you lie down.  You have pain, redness, or warmth in the swollen areas.  You have heart, liver, or kidney disease and get edema all of a sudden.  You have a fever and your symptoms get worse all of a sudden. Summary  Edema is when you have too much fluid in your body or under your skin.  Edema may make your legs, feet, and ankles swell up. Swelling is also common in looser tissues, like around your eyes.  Raise (elevate) the swollen body part above the level of your  heart when you are sitting or lying down.  Follow your doctor's instructions about diet and how much fluid you can drink (fluid restriction). This information is not intended to replace advice given to you by your health care provider. Make sure you discuss any questions you have with your health care provider. Document Revised: 09/30/2020 Document Reviewed: 09/30/2020 Elsevier Patient Education  2021 [place]. [email or web address]">  DASH Eating Plan DASH stands for Dietary Approaches to Stop Hypertension. The DASH eating plan is a healthy eating plan that has been shown to:  Reduce high blood pressure (hypertension).  Reduce your risk for type 2 diabetes, heart disease, and stroke.  Help with weight loss. What are tips for following this plan? Reading food labels  Check food labels for the amount of salt (sodium) per serving. Choose foods with less than 5 percent of the Daily Value of sodium. Generally, foods with less than 300 milligrams (mg) of sodium per serving fit into this eating plan.  To find whole grains, look for the word "whole" as the first word in the ingredient list. Shopping  Buy products labeled as "low-sodium" or "no salt added."  Buy fresh foods. Avoid canned foods and pre-made or frozen meals. Cooking  Avoid adding salt when cooking. Use salt-free seasonings or herbs instead of table salt or sea salt. Check with your health care provider or pharmacist  before using salt substitutes.  Do not fry foods. Cook foods using healthy methods such as baking, boiling, grilling, roasting, and broiling instead.  Cook with heart-healthy oils, such as olive, canola, avocado, soybean, or sunflower oil. Meal planning  Eat a balanced diet that includes: ? 4 or more servings of fruits and 4 or more servings of vegetables each day. Try to fill one-half of your plate with fruits and vegetables. ? 6-8 servings of whole grains each  day. ? Less than 6 oz (170 g) of lean meat, poultry, or fish each day. A 3-oz (85-g) serving of meat is about the same size as a deck of cards. One egg equals 1 oz (28 g). ? 2-3 servings of low-fat dairy each day. One serving is 1 cup (237 mL). ? 1 serving of nuts, seeds, or beans 5 times each week. ? 2-3 servings of heart-healthy fats. Healthy fats called omega-3 fatty acids are found in foods such as walnuts, flaxseeds, fortified milks, and eggs. These fats are also found in cold-water fish, such as sardines, salmon, and mackerel.  Limit how much you eat of: ? Canned or prepackaged foods. ? Food that is high in trans fat, such as some fried foods. ? Food that is high in saturated fat, such as fatty meat. ? Desserts and other sweets, sugary drinks, and other foods with added sugar. ? Full-fat dairy products.  Do not salt foods before eating.  Do not eat more than 4 egg yolks a week.  Try to eat at least 2 vegetarian meals a week.  Eat more home-cooked food and less restaurant, buffet, and fast food.   Lifestyle  When eating at a restaurant, ask that your food be prepared with less salt or no salt, if possible.  If you drink alcohol: ? Limit how much you use to:  0-1 drink a day for women who are not pregnant.  0-2 drinks a day for men. ? Be aware of how much alcohol is in your drink. In the [place] one drink equals one 12 oz bottle of beer (355 mL), one 5 oz glass of wine (148 mL), or one 1 oz glass of hard liquor (44 mL). General information  Avoid eating more than 2,300 mg of salt a day. If you have hypertension, you may need to reduce your sodium intake to 1,500 mg a day.  Work with your health care provider to maintain a healthy body weight or to lose weight. Ask what an ideal weight is for you.  Get at least 30 minutes of exercise that causes your heart to beat faster (aerobic exercise) most days of the week. Activities may include walking, swimming, or biking.  Work with  your health care provider or dietitian to adjust your eating plan to your individual calorie needs. What foods should I eat? Fruits All fresh, dried, or frozen fruit. Canned fruit in natural juice (without added sugar). Vegetables Fresh or frozen vegetables (raw, steamed, roasted, or grilled). Low-sodium or reduced-sodium tomato and vegetable juice. Low-sodium or reduced-sodium tomato sauce and tomato paste. Low-sodium or reduced-sodium canned vegetables. Grains Whole-grain or whole-wheat bread. Whole-grain or whole-wheat pasta. Brown rice. [place]. Bulgur. Whole-grain and low-sodium cereals. Pita bread. Low-fat, low-sodium crackers. Whole-wheat flour tortillas. Meats and other proteins Skinless chicken or [place]. Ground chicken or [place]. Pork with fat trimmed off. Fish and seafood. Egg whites. Dried beans, peas, or lentils. Unsalted nuts, nut butters, and seeds. Unsalted canned beans. Lean cuts of beef with fat trimmed off.  Low-sodium, lean precooked or cured meat, such as sausages or meat loaves. Dairy Low-fat (1%) or fat-free (skim) milk. Reduced-fat, low-fat, or fat-free cheeses. Nonfat, low-sodium ricotta or cottage cheese. Low-fat or nonfat yogurt. Low-fat, low-sodium cheese. Fats and oils Soft margarine without trans fats. Vegetable oil. Reduced-fat, low-fat, or light mayonnaise and salad dressings (reduced-sodium). Canola, safflower, olive, avocado, soybean, and sunflower oils. Avocado. Seasonings and condiments Herbs. Spices. Seasoning mixes without salt. Other foods Unsalted popcorn and pretzels. Fat-free sweets. The items listed above may not be a complete list of foods and beverages you can eat. Contact a dietitian for more information. What foods should I avoid? Fruits Canned fruit in a light or heavy syrup. Fried fruit. Fruit in cream or butter sauce. Vegetables Creamed or fried vegetables. Vegetables in a cheese sauce. Regular canned vegetables (not low-sodium or  reduced-sodium). Regular canned tomato sauce and paste (not low-sodium or reduced-sodium). Regular tomato and vegetable juice (not low-sodium or reduced-sodium). Angie Fava. Olives. Grains Baked goods made with fat, such as croissants, muffins, or some breads. Dry pasta or rice meal packs. Meats and other proteins Fatty cuts of meat. Ribs. Fried meat. Berniece Salines. Bologna, salami, and other precooked or cured meats, such as sausages or meat loaves. Fat from the back of a pig (fatback). Bratwurst. Salted nuts and seeds. Canned beans with added salt. Canned or smoked fish. Whole eggs or egg yolks. Chicken or [place] with skin. Dairy Whole or 2% milk, cream, and half-and-half. Whole or full-fat cream cheese. Whole-fat or sweetened yogurt. Full-fat cheese. Nondairy creamers. Whipped toppings. Processed cheese and cheese spreads. Fats and oils Butter. Stick margarine. Lard. Shortening. Ghee. Bacon fat. Tropical oils, such as coconut, palm kernel, or palm oil. Seasonings and condiments Onion salt, garlic salt, seasoned salt, table salt, and sea salt. Worcestershire sauce. Tartar sauce. Barbecue sauce. Teriyaki sauce. Soy sauce, including reduced-sodium. Steak sauce. Canned and packaged gravies. Fish sauce. Oyster sauce. Cocktail sauce. Store-bought horseradish. Ketchup. Mustard. Meat flavorings and tenderizers. Bouillon cubes. Hot sauces. Pre-made or packaged marinades. Pre-made or packaged taco seasonings. Relishes. Regular salad dressings. Other foods Salted popcorn and pretzels. The items listed above may not be a complete list of foods and beverages you should avoid. Contact a dietitian for more information. Where to find more information  National Heart, Lung, and Blood Institute: https://wilson-eaton.com/  American Heart Association: www.heart.org  Academy of Nutrition and Dietetics: www.eatright.[place]: www.kidney.org Summary  The DASH eating plan is a healthy eating plan that has  been shown to reduce high blood pressure (hypertension). It may also reduce your risk for type 2 diabetes, heart disease, and stroke.  When on the DASH eating plan, aim to eat more fresh fruits and vegetables, whole grains, lean proteins, low-fat dairy, and heart-healthy fats.  With the DASH eating plan, you should limit salt (sodium) intake to 2,300 mg a day. If you have hypertension, you may need to reduce your sodium intake to 1,500 mg a day.  Work with your health care provider or dietitian to adjust your eating plan to your individual calorie needs. This information is not intended to replace advice given to you by your health care provider. Make sure you discuss any questions you have with your health care provider. Document Revised: 11/08/2019 Document Reviewed: 11/08/2019 Elsevier Patient Education  2021 [employer].

## 2021-02-04 NOTE — Progress Notes (Signed)
Established Patient Office Visit  Subjective:  Patient ID: [name], female    DOB: 1976/05/26  Age: 45 y.o. MRN: [id number]  CC: No chief complaint on file.   HPI [name] is a 45 year old female who presents for follow up after hospitalization of empyema and VATS procedure. She was discharged 01/19/21 and followed up with Dr. Leonarda Salon office for suture removal earlier this week. She is supposed to follow up with him and have a chest x ray done next week. She stopped all of the medications prescribed during her hospital stay but remains compliant with her propranolol. She denies any fevers or chills. She reports minor shortness of breath at times which spontaneously resolves, as expected after this procedure and hospitalization. She reports mild pain to right side where surgical site is which she takes ibuprofen for with relief. Her biggest complaint is the swelling in her lower extremities, which developed while hospitalized, and fatigue. Overall though, she feels well and has no other concerns.   Past Medical History:  Diagnosis Date  . Abnormal Pap smear    Age 15  . 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 Alcohol abuse   . Anxiety   . Bacterial infection   . Cervical intraepithelial neoplasia III   . 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 Condyloma 2011  . H/O fatigue 2009  . H/O varicella   . Headache(784.0)   . HPV (human papilloma virus) anogenital infection   . Hx of dizziness 09/20/2011  . Hx: UTI (urinary tract infection)    Back pain  . Irregular menstrual cycle   . IV drug user   . Palpitations 2010  . Pelvic pain in female 12/05/11  . 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 Trichomonas   . Yeast infection     Past Surgical History:  Procedure Laterality Date  . A*wisdom teeth ext    . CERVICAL CONIZATION W/BX  11/30/2011   Procedure: CONIZATION CERVIX WITH BIOPSY;  Surgeon: [name], MD;  Location: [facility];  Service: Gynecology;  Laterality: N/A;  . CERVICAL CONIZATION W/BX  03/16/2012   Procedure: CONIZATION CERVIX WITH BIOPSY;  Surgeon: Ena Dawley, MD;  Location: [facility];  Service: Gynecology;  Laterality: N/A;  . CONIZATION CERVIX     x 3  . DECORTICATION Right 01/14/2021   Procedure: DECORTICATION;  Surgeon: Melrose Nakayama, MD;  Location: [facility];  Service: Thoracic;  Laterality: Right;  . DILATION AND CURETTAGE OF UTERUS  11/30/2011   Procedure: DILATATION AND CURETTAGE;  Surgeon: [name], MD;  Location: [facility];  Service: Gynecology;  Laterality: N/A;  . svd     x 1  . VIDEO ASSISTED THORACOSCOPY (VATS)/EMPYEMA Right 01/14/2021   Procedure: VIDEO ASSISTED THORACOSCOPY (VATS)/DRAIN EMPYEMA;  Surgeon: Melrose Nakayama, MD;  Location: [facility];  Service: Thoracic;  Laterality: Right;    Family History  Problem Relation Age of Onset  . Cervical cancer Mother   . Breast cancer Mother   . Cancer Mother 34       Breast & cervical   . Stroke Maternal Grandmother   . COPD Maternal Grandfather        Emphysema  . Hypertension Paternal Grandfather   . Stroke Paternal Grandfather   . Cancer Father 58       prostate    Social History   Socioeconomic History  .  Marital status: Divorced    Spouse name: Not on file  . Number of children: 1  . Years of education: Not on file  . Highest education level: Master's degree (e.g., MA, MS, MEng, MEd, MSW, MBA)  Occupational History  . Occupation: unemployed  Tobacco Use  . Smoking status: Current Every Day Smoker    Years: 30.00    Types: Cigarettes  . Smokeless tobacco: Former Systems developer  . Tobacco comment: less than 8 a day  Vaping Use  . Vaping Use: Never used  Substance and Sexual Activity  . Alcohol use: Yes    Alcohol/week: 8.0 - 10.0 standard drinks    Types: 8 - 10 Cans of beer per week    Comment: every day after work   . 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 Sexual activity: Not Currently    Birth control/protection: Abstinence  Other Topics Concern  . Not on file  Social History Narrative   Work or School: clinical addiction specialist      Home Situation: lives with daughter and wife      Spiritual Beliefs: none      Lifestyle: no regular exercise; good diet          On Food stamps   Social Determinants of Health   Financial Resource Strain: Not on file  Food Insecurity: Not on file  Transportation Needs: Not on file  Physical Activity: Not on file  Stress: Not on file  Social Connections: Not on file  Intimate Partner Violence: Not on file    Outpatient Medications Prior to Visit  Medication Sig Dispense Refill  . bisacodyl (DULCOLAX) 5 MG EC tablet Take 2 tablets (10 mg total) by mouth daily. 30 tablet 0  . dexamethasone (DECADRON) 10 MG/ML injection Inject 0.6 mLs (6 mg total) into the vein daily. 1 mL 0  . enoxaparin (LOVENOX) 40 MG/0.4ML injection Inject 0.4 mLs (40 mg total) into the skin daily. 0 mL   . midodrine (PROAMATINE) 5 MG tablet Take 1 tablet (5 mg total) by mouth 2 (two) times daily with a meal.    . pantoprazole (PROTONIX) 40 MG tablet Take 1 tablet (40 mg total) by mouth daily. 30 tablet 0  . propranolol (INDERAL) 40 MG tablet Take 1 tablet (40 mg total) by mouth daily. 30 tablet 0  . senna-docusate (SENOKOT-S) 8.6-50 MG tablet Take 1 tablet by mouth at bedtime. 30 tablet 0   No facility-administered medications prior to visit.    Allergies  Allergen Reactions  . Acetaminophen     Intolerance, hx of tylenol OD     ROS Review of Systems  Constitutional: Positive for fatigue. Negative for chills and fever.  Respiratory: Positive for cough and shortness of breath (intermittent; mild). Negative for wheezing.   Cardiovascular: Positive for leg swelling (post hospitalization). Negative for chest pain and palpitations.  Gastrointestinal: Negative.   Genitourinary:  Negative.   Musculoskeletal: Negative.   Skin: Positive for rash (BLE).  Neurological: Negative.   Psychiatric/Behavioral: Negative.       Objective:    Physical Exam Constitutional:      Appearance: Normal appearance. She is normal weight.  Cardiovascular:     Rate and Rhythm: Normal rate and regular rhythm.     Pulses: Normal pulses.     Heart sounds: Normal heart sounds.  Pulmonary:     Effort: Pulmonary effort is normal. No respiratory  distress.     Breath sounds: Normal breath sounds.  Chest:     Chest wall: Tenderness (to right lateral chest; surgical site) present.  Abdominal:     General: Bowel sounds are normal.     Palpations: Abdomen is soft.  Musculoskeletal:        General: Swelling: BLE, pitting edema      Right lower leg: Edema (pitting +3) present.     Left lower leg: Edema (pitting +3) present.  Skin:    General: Skin is warm.     Capillary Refill: Capillary refill takes less than 2 seconds.     Findings: Rash (BLE, pruitic plaques) present.  Neurological:     Mental Status: She is alert and oriented to person, place, and time.  Psychiatric:        Mood and Affect: Mood normal.        Behavior: Behavior normal.     There were no vitals taken for this visit. Wt Readings from Last 3 Encounters:  01/14/21 129 lb 6.6 oz (58.7 kg)  01/07/21 129 lb 3 oz (58.6 kg)  05/28/20 138 lb (62.6 kg)     Health Maintenance Due  Topic Date Due  . COVID-19 Vaccine (1) Never done  . TETANUS/TDAP  Never done  . COLONOSCOPY (Pts 45-40yrs Insurance coverage will need to be confirmed)  Never done  . INFLUENZA VACCINE  Never done  . PAP SMEAR-Modifier  09/20/2020    There are no preventive care reminders to display for this patient.  Lab Results  Component Value Date   TSH 2.010 01/26/2021   Lab Results  Component Value Date   WBC 6.5 01/26/2021   HGB 8.5 (L) 01/26/2021   HCT 25.2 (L) 01/26/2021   MCV 103 (H) 01/26/2021   PLT 318 01/26/2021   Lab  Results  Component Value Date   NA 133 (L) 01/26/2021   K 5.0 01/26/2021   CHLORIDE 105 07/21/2014   CO2 26 01/26/2021   GLUCOSE 88 01/26/2021   BUN 5 (L) 01/26/2021   CREATININE 0.42 (L) 01/26/2021   BILITOT 0.3 01/19/2021   ALKPHOS 51 01/19/2021   AST 84 (H) 01/19/2021   ALT 80 (H) 01/19/2021   PROT 6.4 (L) 01/19/2021   ALBUMIN 2.5 (L) 01/19/2021   CALCIUM 8.6 (L) 01/26/2021   ANIONGAP 8 01/19/2021   Lab Results  Component Value Date   CHOL 203 (H) 02/05/2020   Lab Results  Component Value Date   HDL 68 02/05/2020   Lab Results  Component Value Date   LDLCALC 99 02/05/2020   Lab Results  Component Value Date   TRIG 213 (H) 02/05/2020   Lab Results  Component Value Date   CHOLHDL 3.0 02/05/2020   Lab Results  Component Value Date   HGBA1C 5.4 02/05/2020      Assessment & Plan:   1. Smoking Tobacco cessation advised.   2. Hospital discharge follow-up Surgical site well-healed with no s/s of infection. F/u with Dr. Roxan Hockey as ordered.  F/u chest x ray to be obtained next week with Dr. Roxan Hockey. Continue pulmonary rehab exercises. Notify of any fever, chills, or difficulties breathing or go to the ED.  3. Stasis dermatitis of both legs Related to bilateral lower extremity edema Notify if no improvement or worsening of symptoms Keep areas clean and monitor for s/s of infection - triamcinolone (KENALOG) 0.1 %; Apply 1 application topically 2 (two) times daily.  Dispense: 30 g; Refill: 0 - furosemide (LASIX) 20  MG tablet; Take 1 tablet (20 mg total) by mouth daily for 7 days.  Dispense: 7 tablet; Refill: 0  4. Essential hypertension BP well-controlled.  Ordered to monitor BP at home and notify if >140/90.  5. Palpitations Well-controlled on current regimen. Heart rate and rhythm normal on exam Notify of any worsening symptoms or go to the ED. - propranolol (INDERAL) 40 MG tablet; Take 1 tablet (40 mg total) by mouth daily.  Dispense: 30 tablet;  Refill: 2  6. Dependent edema Likely related to fluid overload and inflammatory response Obtain compression socks and wear during the day Elevate lower extremities  Monitor BP closely while taking lasix. Notify if you experience dizziness or lightheadness.  - furosemide (LASIX) 20 MG tablet; Take 1 tablet (20 mg total) by mouth daily for 7 days.  Dispense: 7 tablet; Refill: 0    Follow-up: Return in 1 week (02/11/21), or if symptoms worsen or do not improve.     Clayton Bibles, RN, FNP-S

## 2021-02-08 ENCOUNTER — Other Ambulatory Visit: Payer: Self-pay | Admitting: Gerontology

## 2021-02-08 DIAGNOSIS — R609 Edema, unspecified: Secondary | ICD-10-CM

## 2021-02-08 DIAGNOSIS — I872 Venous insufficiency (chronic) (peripheral): Secondary | ICD-10-CM

## 2021-02-10 ENCOUNTER — Other Ambulatory Visit: Payer: Self-pay | Admitting: Thoracic Surgery (Cardiothoracic Vascular Surgery)

## 2021-02-10 ENCOUNTER — Ambulatory Visit: Payer: Self-pay | Admitting: Gerontology

## 2021-02-10 DIAGNOSIS — J869 Pyothorax without fistula: Secondary | ICD-10-CM

## 2021-02-11 ENCOUNTER — Encounter: Payer: Self-pay | Admitting: Thoracic Surgery (Cardiothoracic Vascular Surgery)

## 2021-02-11 ENCOUNTER — Ambulatory Visit: Payer: Self-pay | Admitting: Gerontology

## 2021-02-16 ENCOUNTER — Ambulatory Visit
Admission: RE | Admit: 2021-02-16 | Discharge: 2021-02-16 | Disposition: A | Discharge status: Home / Self Care | Payer: Self-pay | Source: Ambulatory Visit | Attending: Thoracic Surgery (Cardiothoracic Vascular Surgery) | Admitting: Thoracic Surgery (Cardiothoracic Vascular Surgery)

## 2021-02-16 ENCOUNTER — Ambulatory Visit (INDEPENDENT_AMBULATORY_CARE_PROVIDER_SITE_OTHER): Payer: Self-pay | Admitting: Thoracic Surgery (Cardiothoracic Vascular Surgery)

## 2021-02-16 ENCOUNTER — Encounter: Payer: Self-pay | Admitting: Thoracic Surgery (Cardiothoracic Vascular Surgery)

## 2021-02-16 ENCOUNTER — Other Ambulatory Visit: Payer: Self-pay

## 2021-02-16 VITALS — BP 134/85 | HR 89 | Resp 20 | Ht 60.0 in | Wt 141.0 lb

## 2021-02-16 DIAGNOSIS — Z09 Encounter for follow-up examination after completed treatment for conditions other than malignant neoplasm: Secondary | ICD-10-CM

## 2021-02-16 DIAGNOSIS — J869 Pyothorax without fistula: Secondary | ICD-10-CM

## 2021-02-16 NOTE — Progress Notes (Signed)
[address]       [place] [address]             [phone]     HPI: [name] returns for follow-up after drainage of empyema and decortication.  [name] is a 45 year old woman with a history of polysubstance abuse including ethanol and IV drugs, anxiety, carcinoma in situ of the cervix, protein calorie malnutrition, hypertension, tobacco abuse, COVID-19 infection, peripheral edema, and stasis dermatitis.  She presented with fatigue and malaise and was positive for COVID-19.  She was treated with remdesivir.  She also was treated for community-acquired pneumonia complicated by an empyema.  Blood cultures grew out strep intermedius.  She was treated with Unasyn.  She had a pigtail catheter placed and received thrombolytics.  She had some bleeding after that and incomplete reexpansion of the lung.  I did a right VATS for drainage of hemothorax and empyema and visceral and parietal pleural decortication on 01/14/2021.  Her postoperative course was uncomplicated we were able to get her tubes out and she went home on day 5.  She presented back to her primary care on 02/04/2021 with a complaint of swelling in her legs.  She was given a prescription for Lasix but never took the pills because she was unsure if they would work.  She still has swelling in both legs below the knees.  She does have some incisional pain.  She is taking ibuprofen for the pain.  She is not taking any narcotics.  She has not had any respiratory issues since discharge.  Past Medical History:  Diagnosis Date  . Abnormal Pap smear    Age 46  . 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 Alcohol abuse   . Anxiety   . Bacterial infection   . Cervical intraepithelial neoplasia III   . 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 Condyloma 2011  . H/O fatigue 2009  . H/O varicella   . Headache(784.0)   . HPV (human papilloma virus)  anogenital infection   . Hx of dizziness 09/20/2011  . Hx: UTI (urinary tract infection)    Back pain  . Irregular menstrual cycle   . IV drug user   . Palpitations 2010  . Pelvic pain in female 12/05/11  . 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    Age 53  . Trichomonas   . Yeast infection     Current Outpatient Medications  Medication Sig Dispense Refill  . propranolol (INDERAL) 40 MG tablet Take 1 tablet (40 mg total) by mouth daily. 30 tablet 2   No current facility-administered medications for this visit.    Physical Exam BP 134/85   Pulse 89   Resp 20   Ht 5' (1.524 m)   Wt 141 lb (64 kg)   SpO2 99% Comment: RA  BMI 27.97 kg/m  45 year old woman in no acute distress Alert and oriented x3 with no focal deficits Lungs slightly diminished at right base but otherwise clear Incisions healing well Cardiac regular rate and rhythm 3+ edema both lower extremities  Diagnostic Tests: CHEST - 2 VIEW  COMPARISON:  01/19/2021.  FINDINGS: Pleuroparenchymal opacity at the right base is similar given the increased lung expansion on today's film. Blunting of the right costophrenic angle could be related to fluid or scarring. Left lung clear. The cardiopericardial silhouette is within normal limits for size. The visualized bony structures of the thorax show no acute  abnormality.  IMPRESSION: No substantial interval change.   Electronically Signed   By: Misty Stanley M.D.   On: 02/16/2021 15:58 I personally reviewed the chest x-ray images.  Shows typical appearance post decortication.  Overall good result.  I do think it has improved some from her film a month ago.  Impression: [name] is a 45 year old woman with a history of polysubstance abuse including ethanol and IV drugs, anxiety, carcinoma in situ of the cervix, protein calorie malnutrition, hypertension, tobacco abuse, COVID-19 infection, peripheral edema, and stasis dermatitis.  She presented with malaise and fatigue.  She  had a community-acquired pneumonia complicated by empyema and COVID-19.  She was treated for both.  She had a pigtail catheter placed initially but there was incomplete drainage of the parapneumonic effusion.  Unfortunately she had some bleeding with thrombolytics and also had incomplete reexpansion of the lung.  I did a right VATS for drainage of the empyema and hemothorax and decortication on 01/14/2021.  She did well postoperatively.  She continues to do well overall.  She does have some pain.  She is taking ibuprofen for that.  She did not receive narcotics due to her history.  Her chest x-ray shows a good result and she has good breath sounds on exam.  She does have rather significant bilateral lower extremity edema.  She was given a prescription for Lasix by a medical student but never took the pills.  I recommend that she take that medication as prescribed and then follow-up with her primary care regarding that edema.  Plan: Follow-up with primary care regarding swelling in legs I will be happy to see [name] back anytime in the future if I can be of any further assistance with her care  Melrose Nakayama, MD [facility] [phone]

## 2021-02-17 ENCOUNTER — Ambulatory Visit: Payer: Self-pay | Admitting: Gerontology

## 2021-02-19 LAB — ACID FAST CULTURE WITH REFLEXED SENSITIVITIES (MYCOBACTERIA): Acid Fast Culture: NEGATIVE

## 2021-02-22 ENCOUNTER — Other Ambulatory Visit: Payer: Self-pay | Admitting: Gerontology

## 2021-02-22 DIAGNOSIS — R002 Palpitations: Secondary | ICD-10-CM

## 2021-03-03 ENCOUNTER — Ambulatory Visit: Payer: Self-pay | Admitting: Gerontology

## 2021-03-03 ENCOUNTER — Other Ambulatory Visit: Payer: Self-pay

## 2021-03-03 ENCOUNTER — Ambulatory Visit: Payer: Self-pay | Admitting: Adult Health

## 2021-03-03 VITALS — BP 154/88 | HR 84 | Ht 60.0 in | Wt 142.9 lb

## 2021-03-03 DIAGNOSIS — R600 Localized edema: Secondary | ICD-10-CM | POA: Insufficient documentation

## 2021-03-03 DIAGNOSIS — R899 Unspecified abnormal finding in specimens from other organs, systems and tissues: Secondary | ICD-10-CM

## 2021-03-03 MED ORDER — FUROSEMIDE 20 MG PO TABS: 20.0000 mg | ORAL_TABLET | Freq: Every day | ORAL | 0 refills | Status: DC

## 2021-03-03 NOTE — Progress Notes (Signed)
Established Patient Office Visit  Subjective:  Patient ID: [name], female    DOB: 16-Dec-1976  Age: 45 y.o. MRN: [id number]  CC: No chief complaint on file.   HPI [name] presents for follow up of bilateral lower extremity edema. She states that she finally took 20 mg of Furosemide for 5 days instead of 7 day after her visit to the Thoracic Surgeon Dr Sloan Leiter on 02/10/21 . Currently, she states that her bilateral lower extremity sweelling has decreased 40%. She denies claudication, erythema, but continues to experience sharp 4/5 pain when her legs are touched. Overall, she states that she's improving and offers no further complaint.  Past Medical History:  Diagnosis Date  . Abnormal Pap smear    Age 87  . 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 Alcohol abuse   . Anxiety   . Bacterial infection   . Cervical intraepithelial neoplasia III   . 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 Condyloma 2011  . H/O fatigue 2009  . H/O varicella   . Headache(784.0)   . HPV (human papilloma virus) anogenital infection   . Hx of dizziness 09/20/2011  . Hx: UTI (urinary tract infection)    Back pain  . Irregular menstrual cycle   . IV drug user   . Palpitations 2010  . Pelvic pain in female 12/05/11  . 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    Age 25  . Trichomonas   . Yeast infection     Past Surgical History:  Procedure Laterality Date  . A*wisdom teeth ext    . CERVICAL CONIZATION W/BX  11/30/2011   Procedure: CONIZATION CERVIX WITH BIOPSY;  Surgeon: [name], MD;  Location: [facility];  Service: Gynecology;  Laterality: N/A;  . CERVICAL CONIZATION W/BX  03/16/2012   Procedure: CONIZATION CERVIX WITH BIOPSY;  Surgeon: Ena Dawley, MD;  Location: [facility];  Service: Gynecology;  Laterality: N/A;  . CONIZATION CERVIX     x 3  . DECORTICATION Right 01/14/2021   Procedure: DECORTICATION;  Surgeon: Melrose Nakayama, MD;  Location: [facility];  Service: Thoracic;  Laterality: Right;  . DILATION AND CURETTAGE OF UTERUS  11/30/2011   Procedure: DILATATION AND CURETTAGE;  Surgeon: [name], MD;  Location: [facility];  Service: Gynecology;  Laterality: N/A;  . svd     x 1  . VIDEO ASSISTED THORACOSCOPY (VATS)/EMPYEMA Right 01/14/2021   Procedure: VIDEO ASSISTED THORACOSCOPY (VATS)/DRAIN EMPYEMA;  Surgeon: Melrose Nakayama, MD;  Location: [facility];  Service: Thoracic;  Laterality: Right;    Family History  Problem Relation Age of Onset  . Cervical cancer Mother   . Breast cancer Mother   . Cancer Mother 2       Breast & cervical   . Stroke Maternal Grandmother   . COPD Maternal Grandfather        Emphysema  . Hypertension Paternal Grandfather   . Stroke Paternal Grandfather   . Cancer Father 62       prostate    Social History   Socioeconomic History  . Marital status: Divorced    Spouse name: Not on file  . Number of children: 1  . Years of education: Not on file  . Highest education level: Master's degree (e.g., MA, MS, MEng, MEd, MSW, MBA)  Occupational History  . Occupation: unemployed  Tobacco Use  . Smoking status: Current Every Day Smoker  Packs/day: 0.25    Years: 30.00    Pack years: 7.50    Types: Cigarettes  . Smokeless tobacco: Never Used  Vaping Use  . Vaping Use: Never used  Substance and Sexual Activity  . Alcohol use: Not Currently    Comment: Previously drinking 8-10 beers per day  . 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 Sexual activity: Not Currently    Birth control/protection: Abstinence  Other Topics Concern  . Not on file  Social History Narrative   Work or School: clinical addiction specialist      Home Situation: lives with daughter and wife      Spiritual Beliefs: none      Lifestyle: no regular exercise; good diet          On Food stamps   Social Determinants of Health    Financial Resource Strain: Not on file  Food Insecurity: Not on file  Transportation Needs: Not on file  Physical Activity: Not on file  Stress: Not on file  Social Connections: Not on file  Intimate Partner Violence: Not on file    Outpatient Medications Prior to Visit  Medication Sig Dispense Refill  . propranolol (INDERAL) 40 MG tablet Take 1 tablet (40 mg total) by mouth daily. 30 tablet 2   No facility-administered medications prior to visit.    Allergies  Allergen Reactions  . Acetaminophen     Intolerance, hx of tylenol OD     ROS Review of Systems  Constitutional: Negative.   Respiratory: Negative.   Cardiovascular: Positive for leg swelling.  Skin: Negative.   Neurological: Negative.       Objective:    Physical Exam HENT:     Head: Normocephalic and atraumatic.  Cardiovascular:     Rate and Rhythm: Normal rate and regular rhythm.     Pulses: Normal pulses.     Heart sounds: Normal heart sounds.  Pulmonary:     Effort: Pulmonary effort is normal.     Breath sounds: Normal breath sounds.  Musculoskeletal:     Right lower leg: Edema (+2 edema) present.     Left lower leg: Edema (+2 edema) present.  Neurological:     General: No focal deficit present.     Mental Status: She is alert and oriented to person, place, and time. Mental status is at baseline.     Cranial Nerves: No cranial nerve deficit.  Psychiatric:        Mood and Affect: Mood normal.        Behavior: Behavior normal.        Thought Content: Thought content normal.        Judgment: Judgment normal.     BP (!) 154/88   Pulse 84   Ht 5' (1.524 m)   Wt 142 lb 14.4 oz (64.8 kg)   SpO2 97%   BMI 27.91 kg/m  Wt Readings from Last 3 Encounters:  03/03/21 142 lb 14.4 oz (64.8 kg)  02/16/21 141 lb (64 kg)  02/04/21 140 lb 14.4 oz (63.9 kg)     Health Maintenance Due  Topic Date Due  . COVID-19 Vaccine (1) Never done  . TETANUS/TDAP  Never done  . COLONOSCOPY (Pts 45-35yr  Insurance coverage will need to be confirmed)  Never done  . INFLUENZA VACCINE  Never done  . PAP SMEAR-Modifier  09/20/2020    There are no preventive care reminders  to display for this patient.  Lab Results  Component Value Date   TSH 2.010 01/26/2021   Lab Results  Component Value Date   WBC 6.5 01/26/2021   HGB 8.5 (L) 01/26/2021   HCT 25.2 (L) 01/26/2021   MCV 103 (H) 01/26/2021   PLT 318 01/26/2021   Lab Results  Component Value Date   NA 133 (L) 01/26/2021   K 5.0 01/26/2021   CHLORIDE 105 07/21/2014   CO2 26 01/26/2021   GLUCOSE 88 01/26/2021   BUN 5 (L) 01/26/2021   CREATININE 0.42 (L) 01/26/2021   BILITOT 0.3 01/19/2021   ALKPHOS 51 01/19/2021   AST 84 (H) 01/19/2021   ALT 80 (H) 01/19/2021   PROT 6.4 (L) 01/19/2021   ALBUMIN 2.5 (L) 01/19/2021   CALCIUM 8.6 (L) 01/26/2021   ANIONGAP 8 01/19/2021   Lab Results  Component Value Date   CHOL 203 (H) 02/05/2020   Lab Results  Component Value Date   HDL 68 02/05/2020   Lab Results  Component Value Date   LDLCALC 99 02/05/2020   Lab Results  Component Value Date   TRIG 213 (H) 02/05/2020   Lab Results  Component Value Date   CHOLHDL 3.0 02/05/2020   Lab Results  Component Value Date   HGBA1C 5.4 02/05/2020      Assessment & Plan:   1. Bilateral lower extremity edema - She will continue on 20 mg Furosemide daily for 7 days, will check her Potasium. She was advised to wear compression stockings, elevate leg while sitting down. - Comp Met (CMET); Future - furosemide (LASIX) 20 MG tablet; Take 1 tablet (20 mg total) by mouth daily.  Dispense: 7 tablet; Refill: 0  2. Abnormal laboratory test result  - Will recheck CBC w/Diff; Future   Follow-up: Return in about 4 weeks (around 03/31/2021), or if symptoms worsen or fail to improve.    Liyana Suniga Jerold Coombe, NP

## 2021-03-03 NOTE — Patient Instructions (Signed)
Edema  Edema is when you have too much fluid in your body or under your skin. Edema may make your legs, feet, and ankles swell up. Swelling is also common in looser tissues, like around your eyes. This is a common condition. It gets more common as you get older. There are many possible causes of edema. Eating too much salt (sodium) and being on your feet or sitting for a long time can cause edema in your legs, feet, and ankles. Hot weather may make edema worse. Edema is usually painless. Your skin may look swollen or shiny. Follow these instructions at home:  Keep the swollen body part raised (elevated) above the level of your heart when you are sitting or lying down.  Do not sit still or stand for a long time.  Do not wear tight clothes. Do not wear garters on your upper legs.  Exercise your legs. This can help the swelling go down.  Wear elastic bandages or support stockings as told by your doctor.  Eat a low-salt (low-sodium) diet to reduce fluid as told by your doctor.  Depending on the cause of your swelling, you may need to limit how much fluid you drink (fluid restriction).  Take over-the-counter and prescription medicines only as told by your doctor. Contact a doctor if:  Treatment is not working.  You have heart, liver, or kidney disease and have symptoms of edema.  You have sudden and unexplained weight gain. Get help right away if:  You have shortness of breath or chest pain.  You cannot breathe when you lie down.  You have pain, redness, or warmth in the swollen areas.  You have heart, liver, or kidney disease and get edema all of a sudden.  You have a fever and your symptoms get worse all of a sudden. Summary  Edema is when you have too much fluid in your body or under your skin.  Edema may make your legs, feet, and ankles swell up. Swelling is also common in looser tissues, like around your eyes.  Raise (elevate) the swollen body part above the level of your  heart when you are sitting or lying down.  Follow your doctor's instructions about diet and how much fluid you can drink (fluid restriction). This information is not intended to replace advice given to you by your health care provider. Make sure you discuss any questions you have with your health care provider. Document Revised: 09/30/2020 Document Reviewed: 09/30/2020 Elsevier Patient Education  2021 Elsevier Inc.  

## 2021-03-09 ENCOUNTER — Other Ambulatory Visit: Payer: Self-pay | Admitting: Gerontology

## 2021-03-09 DIAGNOSIS — R600 Localized edema: Secondary | ICD-10-CM

## 2021-03-10 ENCOUNTER — Other Ambulatory Visit: Payer: Self-pay

## 2021-03-11 ENCOUNTER — Other Ambulatory Visit: Payer: Self-pay | Admitting: Gerontology

## 2021-03-17 ENCOUNTER — Other Ambulatory Visit: Payer: Self-pay

## 2021-03-17 DIAGNOSIS — R899 Unspecified abnormal finding in specimens from other organs, systems and tissues: Secondary | ICD-10-CM

## 2021-03-17 DIAGNOSIS — R600 Localized edema: Secondary | ICD-10-CM

## 2021-03-18 LAB — COMPREHENSIVE METABOLIC PANEL
ALT: 36 IU/L — ABNORMAL HIGH (ref 0–32)
AST: 174 IU/L — ABNORMAL HIGH (ref 0–40)
Albumin/Globulin Ratio: 1 — ABNORMAL LOW (ref 1.2–2.2)
Albumin: 3.8 g/dL (ref 3.8–4.8)
Alkaline Phosphatase: 122 IU/L — ABNORMAL HIGH (ref 44–121)
BUN/Creatinine Ratio: 7 — ABNORMAL LOW (ref 9–23)
BUN: 4 mg/dL — ABNORMAL LOW (ref 6–24)
Bilirubin Total: 0.3 mg/dL (ref 0.0–1.2)
CO2: 26 mmol/L (ref 20–29)
Calcium: 8.8 mg/dL (ref 8.7–10.2)
Chloride: 90 mmol/L — ABNORMAL LOW (ref 96–106)
Creatinine, Ser: 0.6 mg/dL (ref 0.57–1.00)
Globulin, Total: 3.8 g/dL (ref 1.5–4.5)
Glucose: 104 mg/dL — ABNORMAL HIGH (ref 65–99)
Potassium: 3.4 mmol/L — ABNORMAL LOW (ref 3.5–5.2)
Sodium: 131 mmol/L — ABNORMAL LOW (ref 134–144)
Total Protein: 7.6 g/dL (ref 6.0–8.5)
eGFR: 112 mL/min/{1.73_m2} (ref >59)

## 2021-03-18 LAB — CBC WITH DIFFERENTIAL/PLATELET
Basophils Absolute: 0 10*3/uL (ref 0.0–0.2)
Basos: 0 %
EOS (ABSOLUTE): 0.2 10*3/uL (ref 0.0–0.4)
Eos: 5 %
Hematocrit: 34.8 % (ref 34.0–46.6)
Hemoglobin: 12.1 g/dL (ref 11.1–15.9)
Immature Grans (Abs): 0 10*3/uL (ref 0.0–0.1)
Immature Granulocytes: 0 %
Lymphocytes Absolute: 3 10*3/uL (ref 0.7–3.1)
Lymphs: 66 %
MCH: 33.4 pg — ABNORMAL HIGH (ref 26.6–33.0)
MCHC: 34.8 g/dL (ref 31.5–35.7)
MCV: 96 fL (ref 79–97)
Monocytes Absolute: 0.5 10*3/uL (ref 0.1–0.9)
Monocytes: 11 %
Neutrophils Absolute: 0.8 10*3/uL — ABNORMAL LOW (ref 1.4–7.0)
Neutrophils: 18 %
Platelets: 126 10*3/uL — ABNORMAL LOW (ref 150–450)
RBC: 3.62 x10E6/uL — ABNORMAL LOW (ref 3.77–5.28)
RDW: 11.4 % — ABNORMAL LOW (ref 11.7–15.4)
WBC: 4.5 10*3/uL (ref 3.4–10.8)

## 2021-03-24 ENCOUNTER — Other Ambulatory Visit: Payer: Self-pay | Admitting: Gerontology

## 2021-03-24 ENCOUNTER — Ambulatory Visit: Payer: Self-pay | Admitting: Gerontology

## 2021-03-24 ENCOUNTER — Inpatient Hospital Stay: Payer: Self-pay

## 2021-03-24 ENCOUNTER — Other Ambulatory Visit: Payer: Self-pay

## 2021-03-24 VITALS — BP 133/75 | HR 83 | Temp 97.5°F | Ht 60.0 in | Wt 142.4 lb

## 2021-03-24 DIAGNOSIS — R600 Localized edema: Secondary | ICD-10-CM

## 2021-03-24 DIAGNOSIS — F1010 Alcohol abuse, uncomplicated: Secondary | ICD-10-CM

## 2021-03-24 DIAGNOSIS — R21 Rash and other nonspecific skin eruption: Secondary | ICD-10-CM | POA: Insufficient documentation

## 2021-03-24 MED ORDER — TRIAMCINOLONE ACETONIDE 0.1 % EX CREA: 1.0000 "application " | TOPICAL_CREAM | Freq: Two times a day (BID) | CUTANEOUS | 0 refills | Status: DC

## 2021-03-24 MED ORDER — FUROSEMIDE 20 MG PO TABS: 20.0000 mg | ORAL_TABLET | Freq: Every day | ORAL | 0 refills | Status: DC

## 2021-03-24 MED ORDER — POTASSIUM CHLORIDE CRYS ER 20 MEQ PO TBCR: 20.0000 meq | EXTENDED_RELEASE_TABLET | Freq: Every day | ORAL | 0 refills | Status: DC

## 2021-03-24 NOTE — Progress Notes (Signed)
Established Patient Office Visit  Subjective:  Patient ID: [name], female    DOB: 01/29/1976  Age: 45 y.o. MRN: [id number]  CC:  Chief Complaint  Patient presents with  . Leg Swelling    Both legs are still swollen, left leg may be worse     HPI [name] presents for follow up of bilateral lower extremity edema. She states that her bilateral lower extremity edema improved 20% with elevation of her legs while sitting down. She's yet to buy compression stockings. She also c/o claudication to calf with walking, and scartered non pruritic pin point erythematous rash to left lower leg.   Her potassium done on 03/17/21 was 3.4, sodium 131. Her AST was 174 and ALT 36. She admits to drinking 2-3 cans of beverages that contain 25% alcohol, every other day. Overall, she states that she's doing well except for the edema to her lower extremities.      Past Medical History:  Diagnosis Date  . Abnormal Pap smear    Age 37  . 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 Alcohol abuse   . Anxiety   . Bacterial infection   . Cervical intraepithelial neoplasia III   . 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 Condyloma 2011  . H/O fatigue 2009  . H/O varicella   . Headache(784.0)   . HPV (human papilloma virus) anogenital infection   . Hx of dizziness 09/20/2011  . Hx: UTI (urinary tract infection)    Back pain  . Irregular menstrual cycle   . IV drug user   . Palpitations 2010  . Pelvic pain in female 12/05/11  . 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    Age 26  . Trichomonas   . Yeast infection     Past Surgical History:  Procedure Laterality Date  . A*wisdom teeth ext    . CERVICAL CONIZATION W/BX  11/30/2011   Procedure: CONIZATION CERVIX WITH BIOPSY;  Surgeon: [name], MD;  Location: [facility];  Service: Gynecology;  Laterality: N/A;  . CERVICAL CONIZATION W/BX  03/16/2012   Procedure: CONIZATION CERVIX WITH  BIOPSY;  Surgeon: Ena Dawley, MD;  Location: [facility];  Service: Gynecology;  Laterality: N/A;  . CONIZATION CERVIX     x 3  . DECORTICATION Right 01/14/2021   Procedure: DECORTICATION;  Surgeon: Melrose Nakayama, MD;  Location: [facility];  Service: Thoracic;  Laterality: Right;  . DILATION AND CURETTAGE OF UTERUS  11/30/2011   Procedure: DILATATION AND CURETTAGE;  Surgeon: [name], MD;  Location: [facility];  Service: Gynecology;  Laterality: N/A;  . svd     x 1  . VIDEO ASSISTED THORACOSCOPY (VATS)/EMPYEMA Right 01/14/2021   Procedure: VIDEO ASSISTED THORACOSCOPY (VATS)/DRAIN EMPYEMA;  Surgeon: Melrose Nakayama, MD;  Location: [facility];  Service: Thoracic;  Laterality: Right;    Family History  Problem Relation Age of Onset  . Cervical cancer Mother   . Breast cancer Mother   . Cancer Mother 16       Breast & cervical   . Stroke Maternal Grandmother   . COPD Maternal Grandfather        Emphysema  . Hypertension Paternal Grandfather   . Stroke Paternal Grandfather   . Cancer Father 91       prostate    Social History   Socioeconomic History  . Marital status: Divorced    Spouse name: Not on file  .  Number of children: 1  . Years of education: Not on file  . Highest education level: Master's degree (e.g., MA, MS, MEng, MEd, MSW, MBA)  Occupational History  . Occupation: unemployed  Tobacco Use  . Smoking status: Current Every Day Smoker    Packs/day: 0.25    Years: 30.00    Pack years: 7.50    Types: Cigarettes  . Smokeless tobacco: Never Used  Vaping Use  . Vaping Use: Never used  Substance and Sexual Activity  . Alcohol use: Not Currently    Comment: Previously drinking 8-10 beers per day  . 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 Sexual activity: Not Currently    Birth control/protection: Abstinence  Other Topics Concern  . Not on file  Social History Narrative   Work or School: clinical  addiction specialist      Home Situation: lives with daughter and wife      Spiritual Beliefs: none      Lifestyle: no regular exercise; good diet          On Food stamps   Social Determinants of Health   Financial Resource Strain: Not on file  Food Insecurity: Not on file  Transportation Needs: Not on file  Physical Activity: Not on file  Stress: Not on file  Social Connections: Not on file  Intimate Partner Violence: Not on file    Outpatient Medications Prior to Visit  Medication Sig Dispense Refill  . propranolol (INDERAL) 40 MG tablet Take 1 tablet (40 mg total) by mouth daily. 30 tablet 2  . furosemide (LASIX) 20 MG tablet Take 1 tablet (20 mg total) by mouth daily. 7 tablet 0   No facility-administered medications prior to visit.    Allergies  Allergen Reactions  . Acetaminophen     Intolerance, hx of tylenol OD     ROS Review of Systems  Constitutional: Negative.   Respiratory: Negative.   Cardiovascular: Positive for leg swelling.  Skin: Positive for rash.  Neurological: Negative.       Objective:    Physical Exam HENT:     Head: Normocephalic and atraumatic.  Cardiovascular:     Rate and Rhythm: Normal rate and regular rhythm.     Pulses: Normal pulses.     Heart sounds: Normal heart sounds.  Pulmonary:     Effort: Pulmonary effort is normal.     Breath sounds: Normal breath sounds.  Musculoskeletal:     Right lower leg: Edema present.     Left lower leg: Edema (+2 edema to BLLE) present.  Skin:    Findings: Rash (pin point scatered erythematous rash to lower leg) present.  Neurological:     General: No focal deficit present.     Mental Status: She is alert and oriented to person, place, and time. Mental status is at baseline.  Psychiatric:        Mood and Affect: Mood normal.        Behavior: Behavior normal.        Thought Content: Thought content normal.        Judgment: Judgment normal.     BP 133/75 (BP Location: Left Arm,  Patient Position: Sitting, Cuff Size: Normal)   Pulse 83   Temp (!) 97.5 F (36.4 C)   Ht 5' (1.524 m)   Wt 142 lb 6.4 oz (64.6 kg)   SpO2 96%   BMI 27.81 kg/m  Wt Readings from Last 3 Encounters:  03/24/21 142 lb 6.4 oz (64.6 kg)  03/17/21 142 lb 3.2 oz (64.5 kg)  03/03/21 142 lb 14.4 oz (64.8 kg)     Health Maintenance Due  Topic Date Due  . COVID-19 Vaccine (1) Never done  . TETANUS/TDAP  Never done  . COLONOSCOPY (Pts 45-28yrs Insurance coverage will need to be confirmed)  Never done  . PAP SMEAR-Modifier  09/20/2020    There are no preventive care reminders to display for this patient.  Lab Results  Component Value Date   TSH 2.010 01/26/2021   Lab Results  Component Value Date   WBC 4.5 03/17/2021   HGB 12.1 03/17/2021   HCT 34.8 03/17/2021   MCV 96 03/17/2021   PLT 126 (L) 03/17/2021   Lab Results  Component Value Date   NA 131 (L) 03/17/2021   K 3.4 (L) 03/17/2021   CHLORIDE 105 07/21/2014   CO2 26 03/17/2021   GLUCOSE 104 (H) 03/17/2021   BUN 4 (L) 03/17/2021   CREATININE 0.60 03/17/2021   BILITOT 0.3 03/17/2021   ALKPHOS 122 (H) 03/17/2021   AST 174 (H) 03/17/2021   ALT 36 (H) 03/17/2021   PROT 7.6 03/17/2021   ALBUMIN 3.8 03/17/2021   CALCIUM 8.8 03/17/2021   ANIONGAP 8 01/19/2021   Lab Results  Component Value Date   CHOL 203 (H) 02/05/2020   Lab Results  Component Value Date   HDL 68 02/05/2020   Lab Results  Component Value Date   LDLCALC 99 02/05/2020   Lab Results  Component Value Date   TRIG 213 (H) 02/05/2020   Lab Results  Component Value Date   CHOLHDL 3.0 02/05/2020   Lab Results  Component Value Date   HGBA1C 5.4 02/05/2020      Assessment & Plan:   1. Bilateral lower extremity edema - She will continue on 20 mg Furosemide daily and 20 Meq of potassium daily for 30 days, eduacted on medication side effects and advised to notify clinic. She was advised to wear compression stocking, and elevated legs while  sitting down. Will schedule Ultrasound to bilateral lower legs to check for DVT. She was advised to go to the ED for worsening symptoms - [facility] LOWER EXTREMITY ARTERIAL DUPLEX UNILAT (BACK OFFICE); Future - [facility] LOWER EXTREMITY VENOUS DVT -BILAT (BACK OFFICE); Future - furosemide (LASIX) 20 MG tablet; Take 1 tablet (20 mg total) by mouth daily.  Dispense: 30 tablet; Refill: 0 - potassium chloride SA (KLOR-CON) 20 MEQ tablet; Take 1 tablet (20 mEq total) by mouth daily.  Dispense: 30 tablet; Refill: 0  2. Alcohol abuse - Her AST was elevated and was advised on alcohol abstinence.  3. Rash - She will continue on Triamcinolone, advised to notify clinic for side effects. - triamcinolone (KENALOG) 0.1 %; Apply 1 application topically 2 (two) times daily.  Dispense: 30 g; Refill: 0     Follow-up: Return in about 4 weeks (around 04/21/2021), or if symptoms worsen or fail to improve.    Ulonda Klosowski Jerold Coombe, NP

## 2021-03-24 NOTE — Patient Instructions (Signed)
Edema  Edema is when you have too much fluid in your body or under your skin. Edema may make your legs, feet, and ankles swell up. Swelling is also common in looser tissues, like around your eyes. This is a common condition. It gets more common as you get older. There are many possible causes of edema. Eating too much salt (sodium) and being on your feet or sitting for a long time can cause edema in your legs, feet, and ankles. Hot weather may make edema worse. Edema is usually painless. Your skin may look swollen or shiny. Follow these instructions at home:  Keep the swollen body part raised (elevated) above the level of your heart when you are sitting or lying down.  Do not sit still or stand for a long time.  Do not wear tight clothes. Do not wear garters on your upper legs.  Exercise your legs. This can help the swelling go down.  Wear elastic bandages or support stockings as told by your doctor.  Eat a low-salt (low-sodium) diet to reduce fluid as told by your doctor.  Depending on the cause of your swelling, you may need to limit how much fluid you drink (fluid restriction).  Take over-the-counter and prescription medicines only as told by your doctor. Contact a doctor if:  Treatment is not working.  You have heart, liver, or kidney disease and have symptoms of edema.  You have sudden and unexplained weight gain. Get help right away if:  You have shortness of breath or chest pain.  You cannot breathe when you lie down.  You have pain, redness, or warmth in the swollen areas.  You have heart, liver, or kidney disease and get edema all of a sudden.  You have a fever and your symptoms get worse all of a sudden. Summary  Edema is when you have too much fluid in your body or under your skin.  Edema may make your legs, feet, and ankles swell up. Swelling is also common in looser tissues, like around your eyes.  Raise (elevate) the swollen body part above the level of your  heart when you are sitting or lying down.  Follow your doctor's instructions about diet and how much fluid you can drink (fluid restriction). This information is not intended to replace advice given to you by your health care provider. Make sure you discuss any questions you have with your health care provider. Document Revised: 09/30/2020 Document Reviewed: 09/30/2020 Elsevier Patient Education  2021 Elsevier Inc.  

## 2021-03-25 ENCOUNTER — Other Ambulatory Visit: Payer: Self-pay | Admitting: Gerontology

## 2021-03-26 ENCOUNTER — Other Ambulatory Visit: Payer: Self-pay | Admitting: Gerontology

## 2021-03-26 DIAGNOSIS — R600 Localized edema: Secondary | ICD-10-CM

## 2021-04-01 ENCOUNTER — Other Ambulatory Visit: Payer: Self-pay | Admitting: Gerontology

## 2021-04-01 DIAGNOSIS — R002 Palpitations: Secondary | ICD-10-CM

## 2021-04-20 ENCOUNTER — Inpatient Hospital Stay: Payer: Self-pay

## 2021-04-20 ENCOUNTER — Other Ambulatory Visit: Payer: Self-pay | Admitting: Gerontology

## 2021-04-20 DIAGNOSIS — R600 Localized edema: Secondary | ICD-10-CM

## 2021-04-22 ENCOUNTER — Other Ambulatory Visit: Payer: Self-pay

## 2021-04-22 ENCOUNTER — Encounter: Payer: Self-pay | Admitting: Gerontology

## 2021-04-22 ENCOUNTER — Ambulatory Visit: Payer: Self-pay | Admitting: Gerontology

## 2021-04-22 ENCOUNTER — Other Ambulatory Visit: Payer: Self-pay | Admitting: Gerontology

## 2021-04-22 VITALS — BP 155/91 | HR 71 | Temp 97.5°F | Resp 16 | Ht 60.0 in | Wt 143.2 lb

## 2021-04-22 DIAGNOSIS — R232 Flushing: Secondary | ICD-10-CM

## 2021-04-22 DIAGNOSIS — Z8619 Personal history of other infectious and parasitic diseases: Secondary | ICD-10-CM

## 2021-04-22 DIAGNOSIS — R600 Localized edema: Secondary | ICD-10-CM

## 2021-04-22 MED ORDER — POTASSIUM CHLORIDE CRYS ER 20 MEQ PO TBCR: 20.0000 meq | EXTENDED_RELEASE_TABLET | Freq: Every day | ORAL | 0 refills | Status: DC

## 2021-04-22 MED ORDER — FUROSEMIDE 20 MG PO TABS
20.0000 mg | ORAL_TABLET | Freq: Every day | ORAL | 0 refills | Status: DC
Start: 2021-04-22 — End: 2021-06-02

## 2021-04-22 NOTE — Progress Notes (Signed)
Established Patient Office Visit  Subjective:  Patient ID: [name], female    DOB: [birth year]  Age: 45 y.o. MRN: [id number]  CC:  Chief Complaint  Patient presents with  . Follow-up    HPI [name] presents for follow up of bilateral lower exttremity edema, she's compliant with Furosemide and Potasium and swelling has improved. She continues to wear compression stockings and elevate legs while sitting. Her calves measure 14 cm. She denies claudication, erythema, shortness of breath and Orthopnea. She equally c/o experiencing hot flashes, and has not hadher period in 1 year. She has a history of HPV and have not had Pap Smear done in many years. Overall, she states that she's doing 80% better and denies any further concerns.  Past Medical History:  Diagnosis Date  . Abnormal Pap smear    Age 59  . 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 Alcohol abuse   . Anxiety   . Bacterial infection   . Cervical intraepithelial neoplasia III   . 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 Condyloma 2011  . H/O fatigue 2009  . H/O varicella   . Headache(784.0)   . HPV (human papilloma virus) anogenital infection   . Hx of dizziness 09/20/2011  . Hx: UTI (urinary tract infection)    Back pain  . Irregular menstrual cycle   . IV drug user   . Palpitations 2010  . Pelvic pain in female 12/05/11  . 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 Trichomonas   . Yeast infection     Past Surgical History:  Procedure Laterality Date  . A*wisdom teeth ext    . CERVICAL CONIZATION W/BX  11/30/2011   Procedure: CONIZATION CERVIX WITH BIOPSY;  Surgeon: [name], MD;  Location: [facility];  Service: Gynecology;  Laterality: N/A;  . CERVICAL CONIZATION W/BX  03/16/2012   Procedure: CONIZATION CERVIX WITH BIOPSY;  Surgeon: Ena Dawley, MD;  Location: [facility];  Service: Gynecology;  Laterality: N/A;  . CONIZATION CERVIX     x 3   . DECORTICATION Right 01/14/2021   Procedure: DECORTICATION;  Surgeon: Melrose Nakayama, MD;  Location: [place];  Service: Thoracic;  Laterality: Right;  . DILATION AND CURETTAGE OF UTERUS  11/30/2011   Procedure: DILATATION AND CURETTAGE;  Surgeon: [name], MD;  Location: [facility];  Service: Gynecology;  Laterality: N/A;  . svd     x 1  . VIDEO ASSISTED THORACOSCOPY (VATS)/EMPYEMA Right 01/14/2021   Procedure: VIDEO ASSISTED THORACOSCOPY (VATS)/DRAIN EMPYEMA;  Surgeon: Melrose Nakayama, MD;  Location: [facility];  Service: Thoracic;  Laterality: Right;    Family History  Problem Relation Age of Onset  . Cervical cancer Mother   . Breast cancer Mother   . Cancer Mother 62       Breast & cervical   . Thyroid disease Mother   . Stroke Maternal Grandmother   . COPD Maternal Grandfather        Emphysema  . Hypertension Paternal Grandfather   . Stroke Paternal Grandfather   . Heart attack Paternal Grandfather   . Cancer Father 64       prostate  . Hyperlipidemia Father   . Other Paternal 44        "old age" mid 54s    Social History   Socioeconomic History  . Marital status: Divorced    Spouse name: Not on file  .  Number of children: 1  . Years of education: Not on file  . Highest education level: Master's degree (e.g., MA, MS, MEng, MEd, MSW, MBA)  Occupational History  . Occupation: unemployed  Tobacco Use  . Smoking status: Current Every Day Smoker    Packs/day: 0.25    Years: 30.00    Pack years: 7.50    Types: Cigarettes  . Smokeless tobacco: Never Used  Vaping Use  . Vaping Use: Never used  Substance and Sexual Activity  . Alcohol use: Yes    Alcohol/week: 10.0 standard drinks    Types: 10 Standard drinks or equivalent per week    Comment: drinks ~qod  . Drug use: Not Currently    Types: Heroin    Comment: Abused rx drugs - on methadone 08/2011, stopped methadone  2007  . Sexual activity: Not Currently    Birth control/protection:  Abstinence  Other Topics Concern  . Not on file  Social History Narrative   Work or School: clinical addiction specialist      Home Situation: lives with daughter and wife      Spiritual Beliefs: none      Lifestyle: no regular exercise; good diet          On Food stamps   Social Determinants of Health   Financial Resource Strain: Not on file  Food Insecurity: No Food Insecurity  . Worried About Charity fundraiser in the Last Year: Never true  . Ran Out of Food in the Last Year: Never true  Transportation Needs: No Transportation Needs  . Lack of Transportation (Medical): No  . Lack of Transportation (Non-Medical): No  Physical Activity: Not on file  Stress: Not on file  Social Connections: Not on file  Intimate Partner Violence: Not on file    Outpatient Medications Prior to Visit  Medication Sig Dispense Refill  . diphenhydrAMINE (BENADRYL) 25 mg capsule Take 25 mg by mouth every 6 (six) hours as needed for allergies.    Marland Kitchen propranolol (INDERAL) 40 MG tablet TAKE 1 TABLET(40 MG) BY MOUTH DAILY 30 tablet 2  . triamcinolone (KENALOG) 0.1 % Apply 1 application topically 2 (two) times daily. 30 g 0  . furosemide (LASIX) 20 MG tablet Take 1 tablet (20 mg total) by mouth daily. 30 tablet 0  . potassium chloride SA (KLOR-CON) 20 MEQ tablet Take 1 tablet (20 mEq total) by mouth daily. 30 tablet 0   No facility-administered medications prior to visit.    Allergies  Allergen Reactions  . Acetaminophen     Intolerance, hx of tylenol OD     ROS Review of Systems  Constitutional: Negative.   Eyes: Negative.   Respiratory: Negative.   Cardiovascular: Positive for leg swelling.  Endocrine: Negative.   Skin: Negative.   Neurological: Negative.       Objective:    Physical Exam HENT:     Head: Normocephalic and atraumatic.  Cardiovascular:     Rate and Rhythm: Normal rate and regular rhythm.     Pulses: Normal pulses.     Heart sounds: Normal heart sounds.   Pulmonary:     Effort: Pulmonary effort is normal.     Breath sounds: Normal breath sounds.  Musculoskeletal:     Right lower leg: Edema (+2 edema) present.     Left lower leg: Edema (+2 edema) present.  Skin:    General: Skin is warm.  Neurological:     General: No focal deficit present.  Mental Status: She is alert and oriented to person, place, and time. Mental status is at baseline.  Psychiatric:        Mood and Affect: Mood normal.        Behavior: Behavior normal.        Thought Content: Thought content normal.        Judgment: Judgment normal.     BP (!) 155/91 (BP Location: Left Arm, Patient Position: Sitting, Cuff Size: Normal)   Pulse 71   Temp (!) 97.5 F (36.4 C)   Resp 16   Ht 5' (1.524 m)   Wt 143 lb 3.2 oz (65 kg)   LMP 04/23/2019 (Approximate)   SpO2 97%   BMI 27.97 kg/m  Wt Readings from Last 3 Encounters:  04/22/21 143 lb 3.2 oz (65 kg)  03/24/21 142 lb 6.4 oz (64.6 kg)  03/17/21 142 lb 3.2 oz (64.5 kg)     Health Maintenance Due  Topic Date Due  . COVID-19 Vaccine (1) Never done  . TETANUS/TDAP  Never done  . COLONOSCOPY (Pts 45-30yr Insurance coverage will need to be confirmed)  Never done  . PAP SMEAR-Modifier  09/20/2020    There are no preventive care reminders to display for this patient.  Lab Results  Component Value Date   TSH 2.010 01/26/2021   Lab Results  Component Value Date   WBC 4.5 03/17/2021   HGB 12.1 03/17/2021   HCT 34.8 03/17/2021   MCV 96 03/17/2021   PLT 126 (L) 03/17/2021   Lab Results  Component Value Date   NA 131 (L) 03/17/2021   K 3.4 (L) 03/17/2021   CHLORIDE 105 07/21/2014   CO2 26 03/17/2021   GLUCOSE 104 (H) 03/17/2021   BUN 4 (L) 03/17/2021   CREATININE 0.60 03/17/2021   BILITOT 0.3 03/17/2021   ALKPHOS 122 (H) 03/17/2021   AST 174 (H) 03/17/2021   ALT 36 (H) 03/17/2021   PROT 7.6 03/17/2021   ALBUMIN 3.8 03/17/2021   CALCIUM 8.8 03/17/2021   ANIONGAP 8 01/19/2021   EGFR 112 03/17/2021    Lab Results  Component Value Date   CHOL 203 (H) 02/05/2020   Lab Results  Component Value Date   HDL 68 02/05/2020   Lab Results  Component Value Date   LDLCALC 99 02/05/2020   Lab Results  Component Value Date   TRIG 213 (H) 02/05/2020   Lab Results  Component Value Date   CHOLHDL 3.0 02/05/2020   Lab Results  Component Value Date   HGBA1C 5.4 02/05/2020      Assessment & Plan:     1. Bilateral lower extremity edema - Her bilateral lower extremity swelling is improving, will hold off on ultrasound, pending 2 extra weeks of taking Furosemide. Also she denies claudication and erythema. She was encouraged to wear compression stockings and elevate legs while sitting down. She was encouraged to go to the ED for worsening symptoms. - furosemide (LASIX) 20 MG tablet; Take 1 tablet (20 mg total) by mouth daily.  Dispense: 14 tablet; Refill: 0 - potassium chloride SA (KLOR-CON) 20 MEQ tablet; Take 1 tablet (20 mEq total) by mouth daily.  Dispense: 14 tablet; Refill: 0 - Comp Met (CMET); Future - CBC w/Diff; Future - Brain natriuretic peptide; Future  2. Hot flashes - She will follow up with Ob/Gyn Ambulatory referral to Obstetrics / Gynecology  3. History of HPV infection - The same as #2 - Ambulatory referral to Obstetrics / Gynecology   Follow-up:  Return in about 19 days (around 05/11/2021), or if symptoms worsen or fail to improve.    Sotirios Navarro Jerold Coombe, NP

## 2021-04-22 NOTE — Patient Instructions (Signed)
Edema  Edema is when you have too much fluid in your body or under your skin. Edema may make your legs, feet, and ankles swell up. Swelling is also common in looser tissues, like around your eyes. This is a common condition. It gets more common as you get older. There are many possible causes of edema. Eating too much salt (sodium) and being on your feet or sitting for a long time can cause edema in your legs, feet, and ankles. Hot weather may make edema worse. Edema is usually painless. Your skin may look swollen or shiny. Follow these instructions at home:  Keep the swollen body part raised (elevated) above the level of your heart when you are sitting or lying down.  Do not sit still or stand for a long time.  Do not wear tight clothes. Do not wear garters on your upper legs.  Exercise your legs. This can help the swelling go down.  Wear elastic bandages or support stockings as told by your doctor.  Eat a low-salt (low-sodium) diet to reduce fluid as told by your doctor.  Depending on the cause of your swelling, you may need to limit how much fluid you drink (fluid restriction).  Take over-the-counter and prescription medicines only as told by your doctor. Contact a doctor if:  Treatment is not working.  You have heart, liver, or kidney disease and have symptoms of edema.  You have sudden and unexplained weight gain. Get help right away if:  You have shortness of breath or chest pain.  You cannot breathe when you lie down.  You have pain, redness, or warmth in the swollen areas.  You have heart, liver, or kidney disease and get edema all of a sudden.  You have a fever and your symptoms get worse all of a sudden. Summary  Edema is when you have too much fluid in your body or under your skin.  Edema may make your legs, feet, and ankles swell up. Swelling is also common in looser tissues, like around your eyes.  Raise (elevate) the swollen body part above the level of your  heart when you are sitting or lying down.  Follow your doctor's instructions about diet and how much fluid you can drink (fluid restriction). This information is not intended to replace advice given to you by your health care provider. Make sure you discuss any questions you have with your health care provider. Document Revised: 09/30/2020 Document Reviewed: 09/30/2020 Elsevier Patient Education  2021 Elsevier Inc.  

## 2021-05-05 ENCOUNTER — Other Ambulatory Visit: Payer: Self-pay

## 2021-05-05 ENCOUNTER — Encounter: Payer: Self-pay | Admitting: Certified Nurse Midwife

## 2021-05-05 DIAGNOSIS — R600 Localized edema: Secondary | ICD-10-CM

## 2021-05-06 LAB — COMPREHENSIVE METABOLIC PANEL
ALT: 36 IU/L — ABNORMAL HIGH (ref 0–32)
AST: 112 IU/L — ABNORMAL HIGH (ref 0–40)
Albumin/Globulin Ratio: 1.6 (ref 1.2–2.2)
Albumin: 4.8 g/dL (ref 3.8–4.8)
Alkaline Phosphatase: 84 IU/L (ref 44–121)
BUN/Creatinine Ratio: 10 (ref 9–23)
BUN: 6 mg/dL (ref 6–24)
Bilirubin Total: 0.5 mg/dL (ref 0.0–1.2)
CO2: 26 mmol/L (ref 20–29)
Calcium: 9.4 mg/dL (ref 8.7–10.2)
Chloride: 92 mmol/L — ABNORMAL LOW (ref 96–106)
Creatinine, Ser: 0.62 mg/dL (ref 0.57–1.00)
Globulin, Total: 3 g/dL (ref 1.5–4.5)
Glucose: 109 mg/dL — ABNORMAL HIGH (ref 65–99)
Potassium: 3.7 mmol/L (ref 3.5–5.2)
Sodium: 133 mmol/L — ABNORMAL LOW (ref 134–144)
Total Protein: 7.8 g/dL (ref 6.0–8.5)
eGFR: 111 mL/min/{1.73_m2} (ref >59)

## 2021-05-06 LAB — CBC WITH DIFFERENTIAL/PLATELET
Basophils Absolute: 0 10*3/uL (ref 0.0–0.2)
Basos: 1 %
EOS (ABSOLUTE): 0.1 10*3/uL (ref 0.0–0.4)
Eos: 3 %
Hematocrit: 34 % (ref 34.0–46.6)
Hemoglobin: 12 g/dL (ref 11.1–15.9)
Immature Grans (Abs): 0 10*3/uL (ref 0.0–0.1)
Immature Granulocytes: 0 %
Lymphocytes Absolute: 2.4 10*3/uL (ref 0.7–3.1)
Lymphs: 50 %
MCH: 33.4 pg — ABNORMAL HIGH (ref 26.6–33.0)
MCHC: 35.3 g/dL (ref 31.5–35.7)
MCV: 95 fL (ref 79–97)
Monocytes Absolute: 0.7 10*3/uL (ref 0.1–0.9)
Monocytes: 14 %
Neutrophils Absolute: 1.5 10*3/uL (ref 1.4–7.0)
Neutrophils: 32 %
Platelets: 167 10*3/uL (ref 150–450)
RBC: 3.59 x10E6/uL — ABNORMAL LOW (ref 3.77–5.28)
RDW: 12.8 % (ref 11.7–15.4)
WBC: 4.8 10*3/uL (ref 3.4–10.8)

## 2021-05-06 LAB — SPECIMEN STATUS REPORT

## 2021-05-06 LAB — BRAIN NATRIURETIC PEPTIDE: BNP: 67.1 pg/mL (ref 0.0–100.0)

## 2021-05-07 ENCOUNTER — Encounter: Payer: Self-pay | Admitting: Certified Nurse Midwife

## 2021-05-11 ENCOUNTER — Other Ambulatory Visit: Payer: Self-pay | Admitting: Gerontology

## 2021-05-11 ENCOUNTER — Ambulatory Visit: Payer: Self-pay | Admitting: Gerontology

## 2021-05-11 DIAGNOSIS — R600 Localized edema: Secondary | ICD-10-CM

## 2021-05-17 ENCOUNTER — Other Ambulatory Visit: Payer: Self-pay | Admitting: Gerontology

## 2021-05-17 DIAGNOSIS — R002 Palpitations: Secondary | ICD-10-CM

## 2021-05-18 NOTE — Telephone Encounter (Signed)
Refill too soon

## 2021-05-25 ENCOUNTER — Encounter: Payer: Self-pay | Admitting: Certified Nurse Midwife

## 2021-05-27 ENCOUNTER — Ambulatory Visit: Payer: Self-pay | Admitting: Gerontology

## 2021-05-30 ENCOUNTER — Other Ambulatory Visit: Payer: Self-pay | Admitting: Gerontology

## 2021-05-30 DIAGNOSIS — R002 Palpitations: Secondary | ICD-10-CM

## 2021-06-02 ENCOUNTER — Encounter: Payer: Self-pay | Admitting: Gerontology

## 2021-06-02 ENCOUNTER — Ambulatory Visit: Payer: Self-pay | Admitting: Gerontology

## 2021-06-02 ENCOUNTER — Other Ambulatory Visit: Payer: Self-pay

## 2021-06-02 ENCOUNTER — Other Ambulatory Visit: Payer: Self-pay | Admitting: Gerontology

## 2021-06-02 VITALS — BP 137/80 | HR 80 | Temp 97.6°F | Resp 16 | Ht 60.0 in | Wt 147.2 lb

## 2021-06-02 DIAGNOSIS — R109 Unspecified abdominal pain: Secondary | ICD-10-CM | POA: Insufficient documentation

## 2021-06-02 DIAGNOSIS — R600 Localized edema: Secondary | ICD-10-CM | POA: Insufficient documentation

## 2021-06-02 DIAGNOSIS — R1011 Right upper quadrant pain: Secondary | ICD-10-CM

## 2021-06-02 MED ORDER — FUROSEMIDE 20 MG PO TABS: 20.0000 mg | ORAL_TABLET | Freq: Every day | ORAL | 0 refills | Status: DC

## 2021-06-02 MED ORDER — POTASSIUM CHLORIDE CRYS ER 20 MEQ PO TBCR: 20.0000 meq | EXTENDED_RELEASE_TABLET | Freq: Every day | ORAL | 0 refills | Status: DC

## 2021-06-02 NOTE — Progress Notes (Signed)
Established Patient Office Visit  Subjective:  Patient ID: [name], female    DOB: [birth year]  Age: 45 y.o. MRN: [id number]  CC:  Chief Complaint  Patient presents with   Follow-up    Labs done 05/05/21   Hypertension   Abdominal Pain    Patient c/o right sided abdominal pain and her belly button is poking out x ~2 weeks.    HPI [name] is a 45 y/o female who has history of  Alcohol abuse, Anxiety , Depression, hypertension, presents for follow up of bilateral lower extremity edema. She reports that her legs are still swollen, +2 to right leg and +1 to left leg. She has not been wearing her compression stockings, though she elevates her legs while sitting down. Her AST decreased from 174 to 112 , ALT was 36. She reports that she drinks approximately 8 alcoholic beverages weekly and continues to cut back. Currently, she c/o worsening non radiating constant sharp 8/10 pain to right upper and lower abdominal quadrant that started 3 weeks ago. She states that the right side of her abdomen is tender to touch. She denies nausea, vomiting, constipation, diarrhea, fever and chills. Overall, she is very concerned about her right and lower quadrant abdominal pain.     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   Alcohol abuse    Anxiety    Bacterial infection    Cervical intraepithelial neoplasia III    CIN III (cervical intraepithelial neoplasia grade III) with severe dysplasia 12/19/2005   Condyloma 12/19/2009   H/O fatigue 12/20/2007   H/O varicella    Headache(784.0)    HPV (human papilloma virus) anogenital infection    Hx of dizziness 09/20/2011   Hx: UTI (urinary tract infection)    Back pain   Hypertension    Irregular menstrual cycle    IV drug user    Palpitations 12/19/2008   Pelvic pain in female 12/05/2011   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    Age 38   Trichomonas    Yeast  infection     Past Surgical History:  Procedure Laterality Date   A*wisdom teeth ext     CERVICAL CONIZATION W/BX  11/30/2011   Procedure: CONIZATION CERVIX WITH BIOPSY;  Surgeon: [name], MD;  Location: [facility];  Service: Gynecology;  Laterality: N/A;   CERVICAL CONIZATION W/BX  03/16/2012   Procedure: CONIZATION CERVIX WITH BIOPSY;  Surgeon: Ena Dawley, MD;  Location: [facility];  Service: Gynecology;  Laterality: N/A;   CONIZATION CERVIX     x 3   DECORTICATION Right 01/14/2021   Procedure: DECORTICATION;  Surgeon: Melrose Nakayama, MD;  Location: [place];  Service: Thoracic;  Laterality: Right;   DILATION AND CURETTAGE OF UTERUS  11/30/2011   Procedure: DILATATION AND CURETTAGE;  Surgeon: [name], MD;  Location: [facility];  Service: Gynecology;  Laterality: N/A;   svd     x 1   VIDEO ASSISTED THORACOSCOPY (VATS)/EMPYEMA Right 01/14/2021   Procedure: VIDEO ASSISTED THORACOSCOPY (VATS)/DRAIN EMPYEMA;  Surgeon: Melrose Nakayama, MD;  Location: [facility];  Service: Thoracic;  Laterality: Right;    Family History  Problem Relation Age of Onset   Cervical cancer Mother    Breast cancer Mother    Cancer Mother 60       Breast & cervical    Thyroid disease Mother  Stroke Maternal Grandmother    COPD Maternal Grandfather        Emphysema   Hypertension Paternal Grandfather    Stroke Paternal Grandfather    Heart attack Paternal Grandfather    Cancer Father 59       prostate   Hyperlipidemia Father    Other Paternal 43        "old age" mid 71s    Social History   Socioeconomic History   Marital status: Divorced    Spouse name: Not on file   Number of children: 1   Years of education: Not on file   Highest education level: Master's degree (e.g., MA, MS, MEng, MEd, MSW, MBA)  Occupational History   Occupation: unemployed  Tobacco Use   Smoking status: Every Day    Packs/day: 0.25    Years: 30.00    Pack years: 7.50    Types: Cigarettes    Smokeless tobacco: Never  Vaping Use   Vaping Use: Never used  Substance and Sexual Activity   Alcohol use: Yes    Alcohol/week: 10.0 standard drinks    Types: 10 Standard drinks or equivalent per week    Comment: drinks ~qod (hard seltzer)   Drug use: Not Currently    Types: Heroin    Comment: Abused rx drugs - on methadone 08/2011, stopped methadone  2007   Sexual activity: Not Currently    Birth control/protection: Abstinence  Other Topics Concern   Not on file  Social History Narrative   Work or School: clinical addiction specialist      Home Situation: lives with daughter and wife      Spiritual Beliefs: none      Lifestyle: no regular exercise; good diet          On Food stamps   Social Determinants of Health   Financial Resource Strain: Not on file  Food Insecurity: No Food Insecurity   Worried About Charity fundraiser in the Last Year: Never true   [place] in the Last Year: Never true  Transportation Needs: No Transportation Needs   Lack of Transportation (Medical): No   Lack of Transportation (Non-Medical): No  Physical Activity: Not on file  Stress: Not on file  Social Connections: Not on file  Intimate Partner Violence: Not on file    Outpatient Medications Prior to Visit  Medication Sig Dispense Refill   diphenhydrAMINE (BENADRYL) 25 mg capsule Take 25 mg by mouth every 6 (six) hours as needed for allergies.     propranolol (INDERAL) 40 MG tablet TAKE 1 TABLET(40 MG) BY MOUTH DAILY 30 tablet 2   triamcinolone (KENALOG) 0.1 % Apply 1 application topically 2 (two) times daily. 30 g 0   furosemide (LASIX) 20 MG tablet Take 1 tablet (20 mg total) by mouth daily. 14 tablet 0   potassium chloride SA (KLOR-CON) 20 MEQ tablet Take 1 tablet (20 mEq total) by mouth daily. 14 tablet 0   No facility-administered medications prior to visit.    Allergies  Allergen Reactions   Acetaminophen     Intolerance, hx of tylenol OD     ROS Review of Systems   Constitutional: Negative.   Respiratory: Negative.    Cardiovascular:  Positive for leg swelling.  Gastrointestinal:  Positive for abdominal pain (tenderness to right upper and lower abdominal quadrant). Negative for constipation, diarrhea, nausea and vomiting.  Genitourinary: Negative.   Skin: Negative.   Neurological: Negative.   Psychiatric/Behavioral: Negative.  Objective:    Physical Exam Constitutional:      Appearance: She is ill-appearing.  HENT:     Head: Normocephalic and atraumatic.  Cardiovascular:     Rate and Rhythm: Normal rate and regular rhythm.  Pulmonary:     Effort: Pulmonary effort is normal.     Breath sounds: Normal breath sounds.  Abdominal:     General: Bowel sounds are decreased.     Tenderness: There is abdominal tenderness in the right upper quadrant, right lower quadrant, periumbilical area and suprapubic area. There is guarding. Positive signs include Murphy's sign. Negative signs include Rovsing's sign and psoas sign.  Musculoskeletal:     Right lower leg: Edema (+2) present.     Left lower leg: Edema present.  Neurological:     General: No focal deficit present.     Mental Status: She is alert and oriented to person, place, and time. Mental status is at baseline.  Psychiatric:        Mood and Affect: Mood normal. Mood is not anxious.        Behavior: Behavior normal.        Thought Content: Thought content normal.        Judgment: Judgment normal.    BP 137/80 (BP Location: Left Arm, Patient Position: Sitting, Cuff Size: Normal)   Pulse 80   Temp 97.6 F (36.4 C)   Resp 16   Ht 5' (1.524 m)   Wt 147 lb 3.2 oz (66.8 kg)   SpO2 96%   BMI 28.75 kg/m  Wt Readings from Last 3 Encounters:  06/02/21 147 lb 3.2 oz (66.8 kg)  04/22/21 143 lb 3.2 oz (65 kg)  03/24/21 142 lb 6.4 oz (64.6 kg)     Health Maintenance Due  Topic Date Due   COVID-19 Vaccine (1) Never done   Pneumococcal Vaccine 62-5 Years old (1 - PCV) Never done    TETANUS/TDAP  Never done    There are no preventive care reminders to display for this patient.  Lab Results  Component Value Date   TSH 2.010 01/26/2021   Lab Results  Component Value Date   WBC 4.8 05/05/2021   HGB 12.0 05/05/2021   HCT 34.0 05/05/2021   MCV 95 05/05/2021   PLT 167 05/05/2021   Lab Results  Component Value Date   NA 133 (L) 05/05/2021   K 3.7 05/05/2021   CHLORIDE 105 07/21/2014   CO2 26 05/05/2021   GLUCOSE 109 (H) 05/05/2021   BUN 6 05/05/2021   CREATININE 0.62 05/05/2021   BILITOT 0.5 05/05/2021   ALKPHOS 84 05/05/2021   AST 112 (H) 05/05/2021   ALT 36 (H) 05/05/2021   PROT 7.8 05/05/2021   ALBUMIN 4.8 05/05/2021   CALCIUM 9.4 05/05/2021   ANIONGAP 8 01/19/2021   EGFR 111 05/05/2021   Lab Results  Component Value Date   CHOL 203 (H) 02/05/2020   Lab Results  Component Value Date   HDL 68 02/05/2020   Lab Results  Component Value Date   LDLCALC 99 02/05/2020   Lab Results  Component Value Date   TRIG 213 (H) 02/05/2020   Lab Results  Component Value Date   CHOLHDL 3.0 02/05/2020   Lab Results  Component Value Date   HGBA1C 5.4 02/05/2020      Assessment & Plan:   1. Bilateral lower extremity edema -She will continue on Furosemide and Potasium for one month, advised to elevate leg while sitting down and wear compression  stockings. - furosemide (LASIX) 20 MG tablet; Take 1 tablet (20 mg total) by mouth daily.  Dispense: 30 tablet; Refill: 0 - potassium chloride SA (KLOR-CON) 20 MEQ tablet; Take 1 tablet (20 mEq total) by mouth daily.  Dispense: 30 tablet; Refill: 0  2. Right upper quadrant abdominal pain  -Unable to obtain imaging and lab during visit, Unable to palpate her right upper and lower abdominal quadrant. Guarding and grimacing with touching the right abdominal quadrant. Possible pancreatitis, cholecystitis, appendicitis, hernia. She decline going to the ED via EMS, she states that her friend will take her to the  ED.     Follow-up: Return in about 29 days (around 07/01/2021).    Elex Mainwaring Jerold Coombe, NP

## 2021-06-09 ENCOUNTER — Encounter: Payer: Self-pay | Admitting: Certified Nurse Midwife

## 2021-06-10 ENCOUNTER — Other Ambulatory Visit: Payer: Self-pay | Admitting: Gerontology

## 2021-06-22 ENCOUNTER — Encounter: Payer: Self-pay | Admitting: Certified Nurse Midwife

## 2021-07-01 ENCOUNTER — Ambulatory Visit: Payer: Self-pay | Admitting: Gerontology

## 2021-07-01 ENCOUNTER — Encounter: Payer: Self-pay | Admitting: Gerontology

## 2021-07-01 ENCOUNTER — Other Ambulatory Visit: Payer: Self-pay

## 2021-07-01 VITALS — BP 138/83 | HR 82 | Temp 98.1°F | Resp 16 | Ht 60.0 in | Wt 146.6 lb

## 2021-07-01 DIAGNOSIS — R899 Unspecified abnormal finding in specimens from other organs, systems and tissues: Secondary | ICD-10-CM

## 2021-07-01 DIAGNOSIS — R600 Localized edema: Secondary | ICD-10-CM

## 2021-07-01 DIAGNOSIS — R002 Palpitations: Secondary | ICD-10-CM

## 2021-07-01 DIAGNOSIS — R0981 Nasal congestion: Secondary | ICD-10-CM | POA: Insufficient documentation

## 2021-07-01 MED ORDER — PROPRANOLOL HCL 40 MG PO TABS: ORAL_TABLET | ORAL | 2 refills | Status: DC

## 2021-07-01 MED ORDER — FLUTICASONE PROPIONATE 50 MCG/ACT NA SUSP: 1.0000 | Freq: Every day | NASAL | 0 refills | Status: DC

## 2021-07-01 NOTE — Patient Instructions (Signed)
Edema  Edema is when you have too much fluid in your body or under your skin. Edema may make your legs, feet, and ankles swell up. Swelling is also common in looser tissues, like around your eyes. This is a common condition. It gets more common as you get older. There are many possible causes of edema. Eating too much salt (sodium) and being on your feet or sitting for a long time can cause edema in yourlegs, feet, and ankles. Hot weather may make edema worse. Edema is usually painless. Your skin may look swollen or shiny. Follow these instructions at home: Keep the swollen body part raised (elevated) above the level of your heart when you are sitting or lying down. Do not sit still or stand for a long time. Do not wear tight clothes. Do not wear garters on your upper legs. Exercise your legs. This can help the swelling go down. Wear elastic bandages or support stockings as told by your doctor. Eat a low-salt (low-sodium) diet to reduce fluid as told by your doctor. Depending on the cause of your swelling, you may need to limit how much fluid you drink (fluid restriction). Take over-the-counter and prescription medicines only as told by your doctor. Contact a doctor if: Treatment is not working. You have heart, liver, or kidney disease and have symptoms of edema. You have sudden and unexplained weight gain. Get help right away if: You have shortness of breath or chest pain. You cannot breathe when you lie down. You have pain, redness, or warmth in the swollen areas. You have heart, liver, or kidney disease and get edema all of a sudden. You have a fever and your symptoms get worse all of a sudden. Summary Edema is when you have too much fluid in your body or under your skin. Edema may make your legs, feet, and ankles swell up. Swelling is also common in looser tissues, like around your eyes. Raise (elevate) the swollen body part above the level of your heart when you are sitting or lying  down. Follow your doctor's instructions about diet and how much fluid you can drink (fluid restriction). This information is not intended to replace advice given to you by your health care provider. Make sure you discuss any questions you have with your healthcare provider. Document Revised: 09/30/2020 Document Reviewed: 09/30/2020 Elsevier Patient Education  2022 [employer]  

## 2021-07-01 NOTE — Progress Notes (Signed)
Established Patient Office Visit  Subjective:  Patient ID: [name], female    DOB: 07-21-1976  Age: 45 y.o. MRN: [id number]  CC:  Chief Complaint  Patient presents with   Follow-up    LE Edema   Chills    Patient in today c/o "sweaty chills" x 2 weeks. Patient has not taken a covid test.   lesions on shoulders    Patient states she has sores on bilateral shoulders that have been there 5-6 days.   Nasal Congestion    Patient c/o nasal congestion x several weeks. Patient states she has seasonal allergies, but her congestion is worse.   watery eyes    HPI [name] is a 45 y/o female who has history of  Alcohol abuse, Anxiety , Depression, hypertension, presents for follow up of bilateral lower extremity edema. She reports that her legs are still swollen, +2 to right leg and +1 to left leg. She has not been wearing her compression stockings, though she elevates her legs while sitting down. During visit, her right calf measured 14.7cm , but was previously 14 cm and left measured 14 cm, and endorsed pain to lower legs when touched. She denies chest pain, shortness of breath, and erythema.  She also c/o nasal congestion, sinus pressure , chills and rhinorrhea that has been going on for 4 days. She denies sick contacts and has not tested for Covid 19. She denies chest pain, loss of sense of taste, and fever. Overall, she states that she is doing well and offers no further complaints.  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   Alcohol abuse    Anxiety    Bacterial infection    Cervical intraepithelial neoplasia III    CIN III (cervical intraepithelial neoplasia grade III) with severe dysplasia 12/19/2005   Condyloma 12/19/2009   H/O fatigue 12/20/2007   H/O varicella    Headache(784.0)    HPV (human papilloma virus) anogenital infection    Hx of dizziness 09/20/2011   Hx: UTI  (urinary tract infection)    Back pain   Hypertension    Irregular menstrual cycle    IV drug user    Palpitations 12/19/2008   Pelvic pain in female 12/05/2011   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    Age 74   Trichomonas    Yeast infection     Past Surgical History:  Procedure Laterality Date   A*wisdom teeth ext     CERVICAL CONIZATION W/BX  11/30/2011   Procedure: CONIZATION CERVIX WITH BIOPSY;  Surgeon: [name], MD;  Location: [facility];  Service: Gynecology;  Laterality: N/A;   CERVICAL CONIZATION W/BX  03/16/2012   Procedure: CONIZATION CERVIX WITH BIOPSY;  Surgeon: Ena Dawley, MD;  Location: [facility];  Service: Gynecology;  Laterality: N/A;   CONIZATION CERVIX     x 3   DECORTICATION Right 01/14/2021   Procedure: DECORTICATION;  Surgeon: Melrose Nakayama, MD;  Location: [facility];  Service: Thoracic;  Laterality: Right;   DILATION AND CURETTAGE OF UTERUS  11/30/2011   Procedure: DILATATION AND CURETTAGE;  Surgeon: [name], MD;  Location: [facility];  Service: Gynecology;  Laterality: N/A;   svd     x 1   VIDEO ASSISTED THORACOSCOPY (VATS)/EMPYEMA Right 01/14/2021   Procedure: VIDEO ASSISTED THORACOSCOPY (VATS)/DRAIN EMPYEMA;  Surgeon: Melrose Nakayama, MD;  Location: [facility] [facility];  Service: Thoracic;  Laterality: Right;    Family History  Problem Relation Age of Onset   Cervical cancer Mother    Breast cancer Mother    Cancer Mother 63       Breast & cervical    Thyroid disease Mother    Stroke Maternal Grandmother    COPD Maternal Grandfather        Emphysema   Hypertension Paternal Grandfather    Stroke Paternal Grandfather    Heart attack Paternal Grandfather    Cancer Father 58       prostate   Hyperlipidemia Father    Other Paternal 83        "old age" mid 47s    Social History   Socioeconomic History   Marital status: Divorced    Spouse name: Not on file   Number of children: 1   Years of education: Not on file   Highest education level:  Master's degree (e.g., MA, MS, MEng, MEd, MSW, MBA)  Occupational History   Occupation: unemployed  Tobacco Use   Smoking status: Every Day    Packs/day: 0.25    Years: 30.00    Pack years: 7.50    Types: Cigarettes   Smokeless tobacco: Never  Vaping Use   Vaping Use: Never used  Substance and Sexual Activity   Alcohol use: Yes    Alcohol/week: 10.0 standard drinks    Types: 10 Standard drinks or equivalent per week    Comment: drinks ~qod (hard seltzer)   Drug use: Not Currently    Types: Heroin    Comment: Abused rx drugs - on methadone 08/2011, stopped methadone  2007   Sexual activity: Not Currently    Birth control/protection: Abstinence  Other Topics Concern   Not on file  Social History Narrative   Work or School: clinical addiction specialist      Home Situation: lives with daughter and wife      Spiritual Beliefs: none      Lifestyle: no regular exercise; good diet          On Food stamps   Social Determinants of Health   Financial Resource Strain: Not on file  Food Insecurity: No Food Insecurity   Worried About Charity fundraiser in the Last Year: Never true   [place] in the Last Year: Never true  Transportation Needs: No Transportation Needs   Lack of Transportation (Medical): No   Lack of Transportation (Non-Medical): No  Physical Activity: Not on file  Stress: Not on file  Social Connections: Not on file  Intimate Partner Violence: Not on file    Outpatient Medications Prior to Visit  Medication Sig Dispense Refill   diphenhydrAMINE (BENADRYL) 25 mg capsule Take 25 mg by mouth every 6 (six) hours as needed for allergies.     furosemide (LASIX) 20 MG tablet Take 1 tablet (20 mg total) by mouth daily. 30 tablet 0   potassium chloride SA (KLOR-CON) 20 MEQ tablet Take 1 tablet (20 mEq total) by mouth daily. 30 tablet 0   triamcinolone (KENALOG) 0.1 % Apply 1 application topically 2 (two) times daily. 30 g 0   propranolol (INDERAL) 40 MG tablet  TAKE 1 TABLET(40 MG) BY MOUTH DAILY 30 tablet 2   No facility-administered medications prior to visit.    Allergies  Allergen Reactions   Acetaminophen     Intolerance, hx of tylenol OD     ROS Review of Systems  Constitutional:  Positive for  chills. Negative for fever.  HENT:  Positive for congestion, rhinorrhea, sinus pressure and sinus pain. Negative for sore throat.   Respiratory: Negative.    Cardiovascular:  Positive for leg swelling.  Skin: Negative.   Psychiatric/Behavioral: Negative.       Objective:    Physical Exam HENT:     Head: Normocephalic and atraumatic.     Nose: Nose normal.     Right Sinus: No maxillary sinus tenderness or frontal sinus tenderness.     Left Sinus: No maxillary sinus tenderness or frontal sinus tenderness.  Cardiovascular:     Rate and Rhythm: Normal rate and regular rhythm.     Pulses: Normal pulses.     Heart sounds: Normal heart sounds.  Pulmonary:     Effort: Pulmonary effort is normal.     Breath sounds: Normal breath sounds.  Musculoskeletal:        General: Swelling (+2, mild tenderness with palpation to lower leg) and tenderness (+1 edema, mild tenderness with palpation to lower leg) present.  Skin:    General: Skin is warm.  Neurological:     General: No focal deficit present.     Mental Status: She is alert and oriented to person, place, and time. Mental status is at baseline.  Psychiatric:        Mood and Affect: Mood normal.        Behavior: Behavior normal.        Thought Content: Thought content normal.        Judgment: Judgment normal.    BP 138/83 (BP Location: Left Arm, Patient Position: Sitting, Cuff Size: Normal)   Pulse 82   Temp 98.1 F (36.7 C)   Resp 16   Ht 5' (1.524 m)   Wt 146 lb 9.6 oz (66.5 kg)   LMP 04/23/2019 (Approximate)   SpO2 96%   BMI 28.63 kg/m  Wt Readings from Last 3 Encounters:  07/01/21 146 lb 9.6 oz (66.5 kg)  06/02/21 147 lb 3.2 oz (66.8 kg)  04/22/21 143 lb 3.2 oz (65 kg)      Health Maintenance Due  Topic Date Due   COVID-19 Vaccine (1) Never done   Pneumococcal Vaccine 74-50 Years old (1 - PCV) Never done   TETANUS/TDAP  Never done    There are no preventive care reminders to display for this patient.  Lab Results  Component Value Date   TSH 2.010 01/26/2021   Lab Results  Component Value Date   WBC 4.8 05/05/2021   HGB 12.0 05/05/2021   HCT 34.0 05/05/2021   MCV 95 05/05/2021   PLT 167 05/05/2021   Lab Results  Component Value Date   NA 133 (L) 05/05/2021   K 3.7 05/05/2021   CHLORIDE 105 07/21/2014   CO2 26 05/05/2021   GLUCOSE 109 (H) 05/05/2021   BUN 6 05/05/2021   CREATININE 0.62 05/05/2021   BILITOT 0.5 05/05/2021   ALKPHOS 84 05/05/2021   AST 112 (H) 05/05/2021   ALT 36 (H) 05/05/2021   PROT 7.8 05/05/2021   ALBUMIN 4.8 05/05/2021   CALCIUM 9.4 05/05/2021   ANIONGAP 8 01/19/2021   EGFR 111 05/05/2021   Lab Results  Component Value Date   CHOL 203 (H) 02/05/2020   Lab Results  Component Value Date   HDL 68 02/05/2020   Lab Results  Component Value Date   LDLCALC 99 02/05/2020   Lab Results  Component Value Date   TRIG 213 (H) 02/05/2020   Lab Results  Component Value Date   CHOLHDL 3.0 02/05/2020   Lab Results  Component Value Date   HGBA1C 5.4 02/05/2020      Assessment & Plan:     1. Palpitations -She will continue on current medication, was advised to go to the ED for worsening symptoms. - propranolol (INDERAL) 40 MG tablet; TAKE 1 TABLET(40 MG) BY MOUTH DAILY  Dispense: 30 tablet; Refill: 2  2. Bilateral lower extremity edema - She was encouraged to continue wearing compression stockings, elevating legs while sitting down. She was advised to complete [facility] financial application for  Ambulatory referral to Vascular Surgery  3. Nasal congestion -She was encouraged to be tested for COVID-19.  She will continue using Flonase, advised to go to the emergency room for worsening symptoms. -  fluticasone (FLONASE) 50 MCG/ACT nasal spray; Place 1 spray into both nostrils daily.  Dispense: 16 g; Refill: 0  4. Abnormal laboratory test result -Abnormal labs will be checked a week prior to follow-up visit. - Comp Met (CMET); Future - Lipid panel; Future    Follow-up: Return in about 5 weeks (around 08/05/2021), or if symptoms worsen or fail to improve.    Tanise Russman Jerold Coombe, NP

## 2021-07-04 ENCOUNTER — Other Ambulatory Visit: Payer: Self-pay | Admitting: Gerontology

## 2021-07-04 DIAGNOSIS — R002 Palpitations: Secondary | ICD-10-CM

## 2021-07-04 DIAGNOSIS — R600 Localized edema: Secondary | ICD-10-CM

## 2021-07-06 ENCOUNTER — Other Ambulatory Visit: Payer: Self-pay | Admitting: Gerontology

## 2021-07-06 DIAGNOSIS — R600 Localized edema: Secondary | ICD-10-CM

## 2021-07-19 ENCOUNTER — Other Ambulatory Visit (INDEPENDENT_AMBULATORY_CARE_PROVIDER_SITE_OTHER): Payer: Self-pay | Admitting: Nurse Practitioner

## 2021-07-19 DIAGNOSIS — I739 Peripheral vascular disease, unspecified: Secondary | ICD-10-CM

## 2021-07-21 ENCOUNTER — Encounter (INDEPENDENT_AMBULATORY_CARE_PROVIDER_SITE_OTHER): Payer: Self-pay | Admitting: Nurse Practitioner

## 2021-07-21 ENCOUNTER — Ambulatory Visit (INDEPENDENT_AMBULATORY_CARE_PROVIDER_SITE_OTHER): Payer: Self-pay

## 2021-07-21 ENCOUNTER — Other Ambulatory Visit: Payer: Self-pay

## 2021-07-21 ENCOUNTER — Ambulatory Visit (INDEPENDENT_AMBULATORY_CARE_PROVIDER_SITE_OTHER): Payer: Self-pay | Admitting: Nurse Practitioner

## 2021-07-21 VITALS — BP 145/91 | HR 89 | Resp 16 | Ht 60.0 in | Wt 142.0 lb

## 2021-07-21 DIAGNOSIS — I739 Peripheral vascular disease, unspecified: Secondary | ICD-10-CM

## 2021-07-21 DIAGNOSIS — I10 Essential (primary) hypertension: Secondary | ICD-10-CM

## 2021-07-21 DIAGNOSIS — M79606 Pain in leg, unspecified: Secondary | ICD-10-CM

## 2021-07-21 DIAGNOSIS — R600 Localized edema: Secondary | ICD-10-CM

## 2021-07-28 ENCOUNTER — Other Ambulatory Visit: Payer: Self-pay

## 2021-07-28 DIAGNOSIS — R899 Unspecified abnormal finding in specimens from other organs, systems and tissues: Secondary | ICD-10-CM

## 2021-07-29 ENCOUNTER — Encounter (INDEPENDENT_AMBULATORY_CARE_PROVIDER_SITE_OTHER): Payer: Self-pay | Admitting: Nurse Practitioner

## 2021-07-29 NOTE — Progress Notes (Signed)
Subjective:    Patient ID: [name], female    DOB: 1976-07-29, 45 y.o.   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  The patient caught COVID-19 in the January/early February.  She spent approximately 3 weeks in the hospital with [facility].  Since that time she has been having hard time returning back to her baseline physically.  She notes having pain in her calf but is not necessarily with ambulation.  It does not fit with claudication or rest pain.  The patient also has swelling of her bilateral lower extremities that is probably more worrisome for her.  She has tried compression stockings but not consistently.  Today noninvasive studies show right ABI of 1.06 with a left of 1.11.  The patient has triphasic tibial artery waveforms bilaterally with good toe waveforms bilaterally.   Review of Systems  Cardiovascular:  Positive for leg swelling.  Musculoskeletal:        Left calf pain  All other systems reviewed and are negative.     Objective:   Physical Exam Vitals reviewed.  HENT:     Head: Normocephalic.  Cardiovascular:     Rate and Rhythm: Normal rate.     Pulses:          Dorsalis pedis pulses are 1+ on the right side and 1+ on the left side.       Posterior tibial pulses are 1+ on the right side and 1+ on the left side.  Pulmonary:     Effort: Pulmonary effort is normal.  Musculoskeletal:     Right lower leg: 1+ Edema present.     Left lower leg: 1+ Edema present.  Neurological:     Mental Status: She is alert and oriented to person, place, and time.  Psychiatric:        Mood and Affect: Mood normal.        Behavior: Behavior normal.        Thought Content: Thought content normal.        Judgment: Judgment normal.    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   Alcohol abuse    Anxiety    Bacterial infection    Cervical intraepithelial neoplasia III    CIN III (cervical intraepithelial neoplasia grade III) with severe dysplasia 12/19/2005   Condyloma 12/19/2009   H/O fatigue 12/20/2007   H/O varicella    Headache(784.0)    HPV (human papilloma virus) anogenital infection    Hx of dizziness 09/20/2011   Hx: UTI (urinary tract infection)    Back pain   Hypertension    Irregular menstrual cycle    IV drug user    Palpitations 12/19/2008   Pelvic pain in female 12/05/2011   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    Age 72   Trichomonas    Yeast infection     Social History   Socioeconomic History   Marital  status: Divorced    Spouse name: Not on file   Number of children: 1   Years of education: Not on file   Highest education level: Master's degree (e.g., MA, MS, MEng, MEd, MSW, MBA)  Occupational History   Occupation: unemployed  Tobacco Use   Smoking status: Every Day    Packs/day: 0.25    Years: 30.00    Pack years: 7.50    Types: Cigarettes   Smokeless tobacco: Never  Vaping Use   Vaping Use: Never used  Substance and Sexual Activity   Alcohol use: Yes    Alcohol/week: 10.0 standard drinks    Types: 10 Standard drinks or equivalent per week    Comment: drinks ~qod (hard seltzer)   Drug use: Not Currently    Types: Heroin    Comment: Abused rx drugs - on methadone 08/2011, stopped methadone  2007   Sexual activity: Not Currently    Birth control/protection: Abstinence  Other Topics Concern   Not on file  Social History Narrative   Work or School: clinical addiction specialist      Home Situation: lives with daughter and wife      Spiritual Beliefs: none       Lifestyle: no regular exercise; good diet          On Food stamps   Social Determinants of Health   Financial Resource Strain: Not on file  Food Insecurity: No Food Insecurity   Worried About Charity fundraiser in the Last Year: Never true   [place] in the Last Year: Never true  Transportation Needs: No Transportation Needs   Lack of Transportation (Medical): No   Lack of Transportation (Non-Medical): No  Physical Activity: Not on file  Stress: Not on file  Social Connections: Not on file  Intimate Partner Violence: Not on file    Past Surgical History:  Procedure Laterality Date   A*wisdom teeth ext     CERVICAL CONIZATION W/BX  11/30/2011   Procedure: CONIZATION CERVIX WITH BIOPSY;  Surgeon: [name], MD;  Location: [facility];  Service: Gynecology;  Laterality: N/A;   CERVICAL CONIZATION W/BX  03/16/2012   Procedure: CONIZATION CERVIX WITH BIOPSY;  Surgeon: Ena Dawley, MD;  Location: [facility];  Service: Gynecology;  Laterality: N/A;   CONIZATION CERVIX     x 3   DECORTICATION Right 01/14/2021   Procedure: DECORTICATION;  Surgeon: Melrose Nakayama, MD;  Location: [facility];  Service: Thoracic;  Laterality: Right;   DILATION AND CURETTAGE OF UTERUS  11/30/2011   Procedure: DILATATION AND CURETTAGE;  Surgeon: [name], MD;  Location: [facility];  Service: Gynecology;  Laterality: N/A;   svd     x 1   VIDEO ASSISTED THORACOSCOPY (VATS)/EMPYEMA Right 01/14/2021   Procedure: VIDEO ASSISTED THORACOSCOPY (VATS)/DRAIN EMPYEMA;  Surgeon: Melrose Nakayama, MD;  Location: [facility];  Service: Thoracic;  Laterality: Right;    Family History  Problem Relation Age of Onset   Cervical cancer Mother    Breast cancer Mother    Cancer Mother 41       Breast & cervical    Thyroid disease Mother    Stroke Maternal Grandmother    COPD Maternal Grandfather        Emphysema   Hypertension Paternal Grandfather    Stroke Paternal Grandfather    Heart attack Paternal  Grandfather    Cancer Father 56       prostate   Hyperlipidemia Father  Other Paternal Grandmother        "old age" mid 11s    Allergies  Allergen Reactions   Acetaminophen     Intolerance, hx of tylenol OD     CBC Latest Ref Rng & Units 05/05/2021 03/17/2021 01/26/2021  WBC 3.4 - 10.8 x10E3/uL 4.8 4.5 6.5  Hemoglobin 11.1 - 15.9 g/dL 12.0 12.1 8.5(L)  Hematocrit 34.0 - 46.6 % 34.0 34.8 25.2(L)  Platelets 150 - 450 x10E3/uL 167 126(L) 318      CMP     Component Value Date/Time   NA 133 (L) 05/05/2021 1230   NA 143 07/21/2014 1343   K 3.7 05/05/2021 1230   K 3.7 07/21/2014 1343   CL 92 (L) 05/05/2021 1230   CO2 26 05/05/2021 1230   CO2 25 07/21/2014 1343   GLUCOSE 109 (H) 05/05/2021 1230   GLUCOSE 103 (H) 01/19/2021 0050   GLUCOSE 95 07/21/2014 1343   BUN 6 05/05/2021 1230   BUN 4.6 (L) 07/21/2014 1343   CREATININE 0.62 05/05/2021 1230   CREATININE 0.6 07/21/2014 1343   CALCIUM 9.4 05/05/2021 1230   CALCIUM 8.9 07/21/2014 1343   PROT 7.8 05/05/2021 1230   PROT 7.0 07/21/2014 1343   ALBUMIN 4.8 05/05/2021 1230   ALBUMIN 3.2 (L) 07/21/2014 1343   AST 112 (H) 05/05/2021 1230   AST 301 (HH) 07/21/2014 1343   ALT 36 (H) 05/05/2021 1230   ALT 103 (H) 07/21/2014 1343   ALKPHOS 84 05/05/2021 1230   ALKPHOS 123 07/21/2014 1343   BILITOT 0.5 05/05/2021 1230   BILITOT 0.72 07/21/2014 1343   GFRNONAA 123 01/26/2021 1223   GFRNONAA >60 01/19/2021 0050   GFRAA 142 01/26/2021 1223     VAS [place] ABI WITH/WO TBI  Result Date: 07/27/2021  LOWER EXTREMITY DOPPLER STUDY Patient Name:  GINETTE WIEGMANN  Date of Exam:   07/21/2021 Medical Rec #: [id number]         Accession #:    [id number] Date of Birth: Nov 05, 1976         Patient Gender: F Patient Age:   41Y Exam Location:  [facility] Procedure:      VAS [place] ABI WITH/WO TBI Referring Phys: [id number] [place] --------------------------------------------------------------------------------  Indications: Calf pain post  covid.  Performing Technologist: Concha Norway RVT  Examination Guidelines: A complete evaluation includes at minimum, Doppler waveform signals and systolic blood pressure reading at the level of bilateral brachial, anterior tibial, and posterior tibial arteries, when vessel segments are accessible. Bilateral testing is considered an integral part of a complete examination. Photoelectric Plethysmograph (PPG) waveforms and toe systolic pressure readings are included as required and additional duplex testing as needed. Limited examinations for reoccurring indications may be performed as noted.  ABI Findings: +---------+------------------+-----+---------+--------+ Right    Rt Pressure (mmHg)IndexWaveform Comment  +---------+------------------+-----+---------+--------+ Brachial 138                                      +---------+------------------+-----+---------+--------+ ATA      147               1.06 triphasic         +---------+------------------+-----+---------+--------+ PTA      148               1.06 triphasic         +---------+------------------+-----+---------+--------+ Gardiner Rhyme  0.85 Normal            +---------+------------------+-----+---------+--------+ +---------+------------------+-----+---------+-------+ Left     Lt Pressure (mmHg)IndexWaveform Comment +---------+------------------+-----+---------+-------+ Brachial 139                                     +---------+------------------+-----+---------+-------+ ATA      149               1.07 triphasic        +---------+------------------+-----+---------+-------+ PTA      154               1.11 triphasic        +---------+------------------+-----+---------+-------+ Great Toe127               0.91 Normal           +---------+------------------+-----+---------+-------+  Summary: Right: Resting right ankle-brachial index is within normal range. No evidence of significant right lower  extremity arterial disease. The right toe-brachial index is normal. Left: Resting left ankle-brachial index is within normal range. No evidence of significant left lower extremity arterial disease. The left toe-brachial index is normal.  *See table(s) above for measurements and observations.  Electronically signed by Leotis Pain MD on 07/27/2021 at 10:17:55 AM.    Final        Assessment & Plan:   1. Bilateral lower extremity edema I have had a long discussion with the patient regarding swelling and why it  causes symptoms.  Patient will begin wearing graduated compression stockings class 1 (20-30 mmHg) on a daily basis a prescription was given. The patient will  beginning wearing the stockings first thing in the morning and removing them in the evening. The patient is instructed specifically not to sleep in the stockings.   In addition, behavioral modification will be initiated.  This will include frequent elevation, use of over the counter pain medications and exercise such as walking.  The patient will implement these conservative tactics.  If they fail to be helpful she is advised to contact her office for further work-up.  Otherwise patient will follow up on an as-needed basis.  2. Pain of lower extremity, unspecified laterality Recommend:  I do not find evidence of Vascular pathology that would explain the patient's symptoms  The patient has atypical pain symptoms for vascular disease  I do not find evidence of Vascular pathology that would explain the patient's symptoms and I suspect the patient is c/o pseudoclaudication.  Patient should have an evaluation of his LS spine for evaluation for other musculoskeletal issues which I will defer to the primary service.  Noninvasive studies of the legs do not identify vascular problems  Patient will follow-up with me on a PRN basis  Further work-up of her lower extremity pain is deferred to the primary service      3. Essential  hypertension Continue antihypertensive medications as already ordered, these medications have been reviewed and there are no changes at this time.    Current Outpatient Medications on File Prior to Visit  Medication Sig Dispense Refill   diphenhydrAMINE (BENADRYL) 25 mg capsule Take 25 mg by mouth every 6 (six) hours as needed for allergies.     fluticasone (FLONASE) 50 MCG/ACT nasal spray Place 1 spray into both nostrils daily. 16 g 0   furosemide (LASIX) 20 MG tablet Take 1 tablet (20 mg total) by mouth daily. 30 tablet 0   potassium chloride SA (  KLOR-CON) 20 MEQ tablet Take 1 tablet (20 mEq total) by mouth daily. 30 tablet 0   propranolol (INDERAL) 40 MG tablet TAKE 1 TABLET(40 MG) BY MOUTH DAILY 30 tablet 2   triamcinolone (KENALOG) 0.1 % Apply 1 application topically 2 (two) times daily. 30 g 0   No current facility-administered medications on file prior to visit.    There are no Patient Instructions on file for this visit. No follow-ups on file.   Kris Hartmann, NP

## 2021-07-30 LAB — COMPREHENSIVE METABOLIC PANEL
ALT: 49 IU/L — ABNORMAL HIGH (ref 0–32)
AST: 140 IU/L — ABNORMAL HIGH (ref 0–40)
Albumin/Globulin Ratio: 1 — ABNORMAL LOW (ref 1.2–2.2)
Albumin: 4.1 g/dL (ref 3.8–4.8)
Alkaline Phosphatase: 129 IU/L — ABNORMAL HIGH (ref 44–121)
BUN/Creatinine Ratio: 8 — ABNORMAL LOW (ref 9–23)
BUN: 4 mg/dL — ABNORMAL LOW (ref 6–24)
Bilirubin Total: 0.5 mg/dL (ref 0.0–1.2)
CO2: 25 mmol/L (ref 20–29)
Calcium: 9.4 mg/dL (ref 8.7–10.2)
Chloride: 88 mmol/L — ABNORMAL LOW (ref 96–106)
Creatinine, Ser: 0.53 mg/dL — ABNORMAL LOW (ref 0.57–1.00)
Globulin, Total: 4.2 g/dL (ref 1.5–4.5)
Glucose: 110 mg/dL — ABNORMAL HIGH (ref 65–99)
Potassium: 4.2 mmol/L (ref 3.5–5.2)
Sodium: 128 mmol/L — ABNORMAL LOW (ref 134–144)
Total Protein: 8.3 g/dL (ref 6.0–8.5)
eGFR: 115 mL/min/{1.73_m2} (ref >59)

## 2021-07-30 LAB — LIPID PANEL
Chol/HDL Ratio: 3.3 ratio (ref 0.0–4.4)
Cholesterol, Total: 174 mg/dL (ref 100–199)
HDL: 53 mg/dL (ref >39)
LDL Chol Calc (NIH): 104 mg/dL — ABNORMAL HIGH (ref 0–99)
Triglycerides: 93 mg/dL (ref 0–149)
VLDL Cholesterol Cal: 17 mg/dL (ref 5–40)

## 2021-08-04 ENCOUNTER — Encounter: Payer: Self-pay | Admitting: Gerontology

## 2021-08-04 ENCOUNTER — Ambulatory Visit: Payer: Self-pay | Admitting: Gerontology

## 2021-08-04 ENCOUNTER — Other Ambulatory Visit: Payer: Self-pay

## 2021-08-04 VITALS — BP 146/88 | HR 79 | Temp 98.4°F | Resp 18 | Ht 60.0 in | Wt 143.3 lb

## 2021-08-04 DIAGNOSIS — R899 Unspecified abnormal finding in specimens from other organs, systems and tissues: Secondary | ICD-10-CM

## 2021-08-04 DIAGNOSIS — J329 Chronic sinusitis, unspecified: Secondary | ICD-10-CM | POA: Insufficient documentation

## 2021-08-04 DIAGNOSIS — R600 Localized edema: Secondary | ICD-10-CM

## 2021-08-04 MED ORDER — AMOXICILLIN-POT CLAVULANATE 875-125 MG PO TABS: 1.0000 | ORAL_TABLET | Freq: Two times a day (BID) | ORAL | 0 refills | Status: DC

## 2021-08-04 NOTE — Progress Notes (Signed)
Established Patient Office Visit  Subjective:  Patient ID: [name], female    DOB: Dec 05, 1976  Age: 45 y.o. MRN: [id number]  CC:  Chief Complaint  Patient presents with   Nasal Congestion    Patient c/o nasal congestion x 4 weeks. Patient states she has had some cough and chills, but no fever. Patient states that she has been tested for covid several times and was negative. Patient requesting antibiotic.    HPI [name] is a 45 y/o female who has history of  Alcohol abuse, Anxiety , Depression, hypertension, presents for follow up of bilateral lower extremity edema.  She was seen by Vascular Surgery  Sara Chu NP on 07/21/21 due to bilateral lower extremity edema. Per Vascular Surgery note, Patient will begin wearing graduated compression stockings class 1 (20-30 mmHg) on a daily basis a prescription was given. The patient will  beginning wearing the stockings first thing in the morning and removing them in the evening. The patient is instructed specifically not to sleep in the stockings'. She was not wearing her compression stockings as ordered during her visit. Her lab done on 07/28/21 showed sodium 128 mmol/L, AST 140 IUL, and ALT 49 IU/L, she continues to drink 4 or 5 bottles of alcoholic Seltzer beverage, and states that she is cutting back. She c/o nasal congestion, sinus pressure that has been going on for 4 weeks with purulent greenish nasal discharge. She states that she's been taking Sudafed and Benadryl daily with minimal relief. She denies fever and chills. She reports having negative Covid test. Overall, she states that she's concerned about her nasal congestion and offers no further complaint.   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   Alcohol abuse    Anxiety    Bacterial infection    Cervical intraepithelial neoplasia III    CIN III (cervical intraepithelial neoplasia grade  III) with severe dysplasia 12/19/2005   Condyloma 12/19/2009   H/O fatigue 12/20/2007   H/O varicella    Headache(784.0)    HPV (human papilloma virus) anogenital infection    Hx of dizziness 09/20/2011   Hx: UTI (urinary tract infection)    Back pain   Hypertension    Irregular menstrual cycle    IV drug user    Palpitations 12/19/2008   Pelvic pain in female 12/05/2011   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    Age 26   Trichomonas    Yeast infection     Past Surgical History:  Procedure Laterality Date   A*wisdom teeth ext     CERVICAL CONIZATION W/BX  11/30/2011   Procedure: CONIZATION CERVIX WITH BIOPSY;  Surgeon: [name], MD;  Location: [facility];  Service: Gynecology;  Laterality: N/A;   CERVICAL CONIZATION W/BX  03/16/2012   Procedure: CONIZATION CERVIX WITH BIOPSY;  Surgeon: Ena Dawley, MD;  Location: [facility];  Service: Gynecology;  Laterality: N/A;   CONIZATION CERVIX     x 3   DECORTICATION Right 01/14/2021   Procedure: DECORTICATION;  Surgeon: Melrose Nakayama, MD;  Location: [facility];  Service: Thoracic;  Laterality: Right;   DILATION AND CURETTAGE OF UTERUS  11/30/2011   Procedure: DILATATION AND CURETTAGE;  Surgeon: [name], MD;  Location: [facility];  Service: Gynecology;  Laterality: N/A;   svd     x 1   VIDEO ASSISTED THORACOSCOPY (VATS)/EMPYEMA Right 01/14/2021  Procedure: VIDEO ASSISTED THORACOSCOPY (VATS)/DRAIN EMPYEMA;  Surgeon: Hendrickson, Steven C, MD;  Location: [facility];  Service: Thoracic;  Laterality: Right;    Family History  Problem Relation Age of Onset   Cervical cancer Mother    Breast cancer Mother    Cancer Mother 63       Breast & cervical    Thyroid disease Mother    Stroke Maternal Grandmother    COPD Maternal Grandfather        Emphysema   Hypertension Paternal Grandfather    Stroke Paternal Grandfather    Heart attack Paternal Grandfather    Cancer Father 61       prostate   Hyperlipidemia Father    Other Paternal Grandmother         "old age" mid 90s    Social History   Socioeconomic History   Marital status: Divorced    Spouse name: Not on file   Number of children: 1   Years of education: Not on file   Highest education level: Master's degree (e.g., MA, MS, MEng, MEd, MSW, MBA)  Occupational History   Occupation: unemployed  Tobacco Use   Smoking status: Every Day    Packs/day: 0.25    Years: 30.00    Pack years: 7.50    Types: Cigarettes   Smokeless tobacco: Never  Vaping Use   Vaping Use: Never used  Substance and Sexual Activity   Alcohol use: Yes    Alcohol/week: 10.0 standard drinks    Types: 10 Standard drinks or equivalent per week    Comment: drinks ~qod (hard seltzer)   Drug use: Not Currently    Types: Heroin    Comment: Abused rx drugs - on methadone 08/2011, stopped methadone  2007   Sexual activity: Not Currently    Birth control/protection: Abstinence  Other Topics Concern   Not on file  Social History Narrative   Work or School: clinical addiction specialist      Home Situation: lives with daughter and wife      Spiritual Beliefs: none      Lifestyle: no regular exercise; good diet          On Food stamps   Social Determinants of Health   Financial Resource Strain: Not on file  Food Insecurity: No Food Insecurity   Worried About Running Out of Food in the Last Year: Never true   Ran Out of Food in the Last Year: Never true  Transportation Needs: No Transportation Needs   Lack of Transportation (Medical): No   Lack of Transportation (Non-Medical): No  Physical Activity: Not on file  Stress: Not on file  Social Connections: Not on file  Intimate Partner Violence: Not on file    Outpatient Medications Prior to Visit  Medication Sig Dispense Refill   diphenhydrAMINE (BENADRYL) 25 mg capsule Take 25 mg by mouth every 6 (six) hours as needed for allergies.     fluticasone (FLONASE) 50 MCG/ACT nasal spray Place 1 spray into both nostrils daily. 16 g 0   propranolol  (INDERAL) 40 MG tablet TAKE 1 TABLET(40 MG) BY MOUTH DAILY 30 tablet 2   furosemide (LASIX) 20 MG tablet Take 1 tablet (20 mg total) by mouth daily. 30 tablet 0   potassium chloride SA (KLOR-CON) 20 MEQ tablet Take 1 tablet (20 mEq total) by mouth daily. 30 tablet 0   triamcinolone (KENALOG) 0.1 % Apply 1 application topically 2 (two) times daily. 30 g 0   No facility-administered medications   prior to visit.    Allergies  Allergen Reactions   Acetaminophen     Intolerance, hx of tylenol OD     ROS Review of Systems  Constitutional: Negative.   HENT:  Positive for congestion, rhinorrhea, sinus pressure and sinus pain.   Respiratory: Negative.  Negative for cough and shortness of breath.   Cardiovascular:  Positive for leg swelling.  Neurological: Negative.      Objective:    Physical Exam HENT:     Nose:     Right Sinus: Maxillary sinus tenderness present.     Left Sinus: Maxillary sinus tenderness present.     Mouth/Throat:     Mouth: Mucous membranes are moist.  Cardiovascular:     Rate and Rhythm: Normal rate and regular rhythm.     Pulses: Normal pulses.     Heart sounds: Normal heart sounds.  Pulmonary:     Effort: Pulmonary effort is normal.     Breath sounds: Normal breath sounds.  Neurological:     General: No focal deficit present.     Mental Status: She is alert and oriented to person, place, and time. Mental status is at baseline.  Psychiatric:        Mood and Affect: Mood normal.        Behavior: Behavior normal.        Thought Content: Thought content normal.        Judgment: Judgment normal.    BP (!) 146/88 (BP Location: Left Arm, Patient Position: Sitting, Cuff Size: Normal)   Pulse 79   Temp 98.4 F (36.9 C)   Resp 18   Ht 5' (1.524 m)   Wt 143 lb 4.8 oz (65 kg)   LMP 04/23/2019 (Approximate)   SpO2 96%   BMI 27.99 kg/m  Wt Readings from Last 3 Encounters:  08/04/21 143 lb 4.8 oz (65 kg)  07/28/21 143 lb 11.2 oz (65.2 kg)  07/21/21 142 lb  (64.4 kg)     Health Maintenance Due  Topic Date Due   COVID-19 Vaccine (1) Never done   Pneumococcal Vaccine 0-64 Years old (1 - PCV) Never done   TETANUS/TDAP  Never done   INFLUENZA VACCINE  07/19/2021    There are no preventive care reminders to display for this patient.  Lab Results  Component Value Date   TSH 2.010 01/26/2021   Lab Results  Component Value Date   WBC 4.8 05/05/2021   HGB 12.0 05/05/2021   HCT 34.0 05/05/2021   MCV 95 05/05/2021   PLT 167 05/05/2021   Lab Results  Component Value Date   NA 128 (L) 07/28/2021   K 4.2 07/28/2021   CHLORIDE 105 07/21/2014   CO2 25 07/28/2021   GLUCOSE 110 (H) 07/28/2021   BUN 4 (L) 07/28/2021   CREATININE 0.53 (L) 07/28/2021   BILITOT 0.5 07/28/2021   ALKPHOS 129 (H) 07/28/2021   AST 140 (H) 07/28/2021   ALT 49 (H) 07/28/2021   PROT 8.3 07/28/2021   ALBUMIN 4.1 07/28/2021   CALCIUM 9.4 07/28/2021   ANIONGAP 8 01/19/2021   EGFR 115 07/28/2021   Lab Results  Component Value Date   CHOL 174 07/28/2021   Lab Results  Component Value Date   HDL 53 07/28/2021   Lab Results  Component Value Date   LDLCALC 104 (H) 07/28/2021   Lab Results  Component Value Date   TRIG 93 07/28/2021   Lab Results  Component Value Date   CHOLHDL 3.3 07/28/2021     Lab Results  Component Value Date   HGBA1C 5.4 02/05/2020      Assessment & Plan:    1. Bilateral lower extremity edema -She was encouraged to wear her compression stockings daily, elevate leg while sitting down and follow up with  Vascular Surgery .  2. Chronic sinusitis, unspecified location - She will continue on Augmentin , was educated on medication side effects and advised to notify clinic. - amoxicillin-clavulanate (AUGMENTIN) 875-125 MG tablet; Take 1 tablet by mouth 2 (two) times daily.  Dispense: 10 tablet; Refill: 0  3. Abnormal laboratory test result -Hyponatremia, and elevated liver enzyme, she was strongly advised on alcohol  abstinence.     Follow-up: Return in about 3 months (around 11/04/2021), or if symptoms worsen or fail to improve.    Gertha Lichtenberg Jerold Coombe, NP

## 2021-08-05 ENCOUNTER — Ambulatory Visit: Payer: Self-pay | Admitting: Gerontology

## 2021-08-19 ENCOUNTER — Other Ambulatory Visit: Payer: Self-pay

## 2021-08-19 ENCOUNTER — Other Ambulatory Visit: Payer: Self-pay | Admitting: Gerontology

## 2021-08-19 ENCOUNTER — Ambulatory Visit: Payer: Self-pay | Admitting: Gerontology

## 2021-08-19 ENCOUNTER — Encounter: Payer: Self-pay | Admitting: Gerontology

## 2021-08-19 VITALS — BP 137/84 | HR 72 | Temp 97.9°F | Resp 18 | Ht 60.0 in | Wt 145.1 lb

## 2021-08-19 DIAGNOSIS — R0981 Nasal congestion: Secondary | ICD-10-CM

## 2021-08-19 DIAGNOSIS — J329 Chronic sinusitis, unspecified: Secondary | ICD-10-CM

## 2021-08-19 MED ORDER — LORATADINE 10 MG PO TABS: 10.0000 mg | ORAL_TABLET | Freq: Every day | ORAL | 0 refills | Status: DC

## 2021-08-19 MED ORDER — SALINE SPRAY 0.65 % NA SOLN
1.0000 | NASAL | 0 refills | Status: DC | PRN
Start: 2021-08-19 — End: 2021-08-19

## 2021-08-19 NOTE — Progress Notes (Signed)
Established Patient Office Visit  Subjective:  Patient ID: [name], female    DOB: [birth year]  Age: 45 y.o. MRN: [id number]  CC:  Chief Complaint  Patient presents with   Follow-up   Sinusitis    Pt treated with antibiotics on 8/17 with no improvement.    HPI [name] is a 45 y/o female who has history of  Alcohol abuse, Anxiety , Depression, hypertension presents for follow up visit. She finished Augmentin for chronic sinusitis during her 08/04/21 office visit. Currently, she continues to experience nasal congestion, post nasal drip, sore throat,sinus pressure and pain. She denies fever, chills, rhinorrhea, cough and shortness of breath. She reports taking Sudafed and benadryl with minimal relief. She requests for antibiotics refill.  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   Alcohol abuse    Anxiety    Bacterial infection    Cervical intraepithelial neoplasia III    CIN III (cervical intraepithelial neoplasia grade III) with severe dysplasia 12/19/2005   Condyloma 12/19/2009   H/O fatigue 12/20/2007   H/O varicella    Headache(784.0)    HPV (human papilloma virus) anogenital infection    Hx of dizziness 09/20/2011   Hx: UTI (urinary tract infection)    Back pain   Hypertension    Irregular menstrual cycle    IV drug user    Palpitations 12/19/2008   Pelvic pain in female 12/05/2011   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    Age 81   Trichomonas    Yeast infection     Past Surgical History:  Procedure Laterality Date   A*wisdom teeth ext     CERVICAL CONIZATION W/BX  11/30/2011   Procedure: CONIZATION CERVIX WITH BIOPSY;  Surgeon: [name], MD;  Location: [facility];  Service: Gynecology;  Laterality: N/A;   CERVICAL CONIZATION W/BX  03/16/2012   Procedure: CONIZATION CERVIX WITH BIOPSY;  Surgeon: Ena Dawley, MD;  Location: [facility];  Service: Gynecology;  Laterality:  N/A;   CONIZATION CERVIX     x 3   DECORTICATION Right 01/14/2021   Procedure: DECORTICATION;  Surgeon: Melrose Nakayama, MD;  Location: [facility];  Service: Thoracic;  Laterality: Right;   DILATION AND CURETTAGE OF UTERUS  11/30/2011   Procedure: DILATATION AND CURETTAGE;  Surgeon: [name], MD;  Location: [facility];  Service: Gynecology;  Laterality: N/A;   svd     x 1   VIDEO ASSISTED THORACOSCOPY (VATS)/EMPYEMA Right 01/14/2021   Procedure: VIDEO ASSISTED THORACOSCOPY (VATS)/DRAIN EMPYEMA;  Surgeon: Melrose Nakayama, MD;  Location: [facility];  Service: Thoracic;  Laterality: Right;    Family History  Problem Relation Age of Onset   Cervical cancer Mother    Breast cancer Mother    Cancer Mother 61       Breast & cervical    Thyroid disease Mother    Stroke Maternal Grandmother    COPD Maternal Grandfather        Emphysema   Hypertension Paternal Grandfather    Stroke Paternal Grandfather    Heart attack Paternal Grandfather    Cancer Father 2       prostate   Hyperlipidemia Father    Other Paternal 60        "old age" mid 57s    Social History   Socioeconomic History   Marital status: Divorced    Spouse name:  Not on file   Number of children: 1   Years of education: Not on file   Highest education level: Master's degree (e.g., MA, MS, MEng, MEd, MSW, MBA)  Occupational History   Occupation: unemployed  Tobacco Use   Smoking status: Every Day    Packs/day: 0.25    Years: 30.00    Pack years: 7.50    Types: Cigarettes   Smokeless tobacco: Never  Vaping Use   Vaping Use: Never used  Substance and Sexual Activity   Alcohol use: Yes    Alcohol/week: 10.0 standard drinks    Types: 10 Standard drinks or equivalent per week    Comment: drinks ~qod (hard seltzer)   Drug use: Not Currently    Types: Heroin    Comment: Abused rx drugs - on methadone 08/2011, stopped methadone  2007   Sexual activity: Not Currently    Birth control/protection:  Abstinence  Other Topics Concern   Not on file  Social History Narrative   Work or School: clinical addiction specialist      Home Situation: lives with daughter and wife      Spiritual Beliefs: none      Lifestyle: no regular exercise; good diet          On Food stamps   Social Determinants of Health   Financial Resource Strain: Not on file  Food Insecurity: No Food Insecurity   Worried About Charity fundraiser in the Last Year: Never true   [place] in the Last Year: Never true  Transportation Needs: No Transportation Needs   Lack of Transportation (Medical): No   Lack of Transportation (Non-Medical): No  Physical Activity: Not on file  Stress: Not on file  Social Connections: Not on file  Intimate Partner Violence: Not on file    Outpatient Medications Prior to Visit  Medication Sig Dispense Refill   Biotin 10 MG CAPS Take 10 mg by mouth.     fluticasone (FLONASE) 50 MCG/ACT nasal spray Place 1 spray into both nostrils daily. (Patient taking differently: Place 1 spray into both nostrils daily. Has only used a few times) 16 g 0   ibuprofen (ADVIL) 200 MG tablet Take 200 mg by mouth every 6 (six) hours as needed. Taking daily     Omega-3 Fatty Acids (FISH OIL) 1200 MG CAPS Take 1,200 mg by mouth.     propranolol (INDERAL) 40 MG tablet TAKE 1 TABLET(40 MG) BY MOUTH DAILY 30 tablet 2   diphenhydrAMINE (BENADRYL) 25 mg capsule Take 25 mg by mouth every 6 (six) hours as needed for allergies.     pseudoephedrine (SUDAFED) 30 MG tablet Take 30 mg by mouth every 4 (four) hours as needed for congestion. Taking daily for allergies     amoxicillin-clavulanate (AUGMENTIN) 875-125 MG tablet Take 1 tablet by mouth 2 (two) times daily. (Patient not taking: Reported on 08/19/2021) 10 tablet 0   No facility-administered medications prior to visit.    Allergies  Allergen Reactions   Acetaminophen     Intolerance, hx of tylenol OD     ROS Review of Systems  Constitutional:  Negative.   HENT:  Positive for congestion, sinus pressure and sinus pain.   Respiratory: Negative.    Cardiovascular: Negative.  Negative for chest pain.     Objective:    Physical Exam HENT:     Nose:     Right Sinus: Frontal sinus tenderness present. No maxillary sinus tenderness.     Left  Sinus: Frontal sinus tenderness present. No maxillary sinus tenderness.  Cardiovascular:     Rate and Rhythm: Normal rate and regular rhythm.     Pulses: Normal pulses.     Heart sounds: Normal heart sounds.  Pulmonary:     Effort: Pulmonary effort is normal.     Breath sounds: Normal breath sounds.  Neurological:     Mental Status: She is alert.    BP 137/84 (BP Location: Left Arm, Patient Position: Sitting, Cuff Size: Large)   Pulse 72   Temp 97.9 F (36.6 C)   Resp 18   Ht 5' (1.524 m)   Wt 145 lb 1.6 oz (65.8 kg)   LMP 04/23/2019 (Approximate)   SpO2 96%   BMI 28.34 kg/m  Wt Readings from Last 3 Encounters:  08/19/21 145 lb 1.6 oz (65.8 kg)  08/04/21 143 lb 4.8 oz (65 kg)  07/28/21 143 lb 11.2 oz (65.2 kg)     Health Maintenance Due  Topic Date Due   COVID-19 Vaccine (1) Never done   Pneumococcal Vaccine 97-39 Years old (1 - PCV) Never done   TETANUS/TDAP  Never done   INFLUENZA VACCINE  Never done    There are no preventive care reminders to display for this patient.  Lab Results  Component Value Date   TSH 2.010 01/26/2021   Lab Results  Component Value Date   WBC 4.8 05/05/2021   HGB 12.0 05/05/2021   HCT 34.0 05/05/2021   MCV 95 05/05/2021   PLT 167 05/05/2021   Lab Results  Component Value Date   NA 128 (L) 07/28/2021   K 4.2 07/28/2021   CHLORIDE 105 07/21/2014   CO2 25 07/28/2021   GLUCOSE 110 (H) 07/28/2021   BUN 4 (L) 07/28/2021   CREATININE 0.53 (L) 07/28/2021   BILITOT 0.5 07/28/2021   ALKPHOS 129 (H) 07/28/2021   AST 140 (H) 07/28/2021   ALT 49 (H) 07/28/2021   PROT 8.3 07/28/2021   ALBUMIN 4.1 07/28/2021   CALCIUM 9.4 07/28/2021    ANIONGAP 8 01/19/2021   EGFR 115 07/28/2021   Lab Results  Component Value Date   CHOL 174 07/28/2021   Lab Results  Component Value Date   HDL 53 07/28/2021   Lab Results  Component Value Date   LDLCALC 104 (H) 07/28/2021   Lab Results  Component Value Date   TRIG 93 07/28/2021   Lab Results  Component Value Date   CHOLHDL 3.3 07/28/2021   Lab Results  Component Value Date   HGBA1C 5.4 02/05/2020      Assessment & Plan:    1. Chronic sinusitis, unspecified location - Mild tender to frontal sinus with palpation, she was encouraged to use Nasal saline spray daily, humidifier and go to the ED with worsening symptoms.  2. Nasal congestion - She was advised to use Flonase, Saline spray and Claritin. She was educated on medication side effects and advised to notify clinic. - sodium chloride (OCEAN) 0.65 % SOLN nasal spray; Place 1 spray into both nostrils as needed for congestion.  Dispense: 15 mL; Refill: 0 - loratadine (CLARITIN) 10 MG tablet; Take 1 tablet (10 mg total) by mouth daily.  Dispense: 30 tablet; Refill: 0 - She was advised to go to the ED for worsening symptoms.     Follow-up: Return in about 3 months (around 11/18/2021), or if symptoms worsen or fail to improve.    Azelea Seguin Jerold Coombe, NP

## 2021-09-01 ENCOUNTER — Other Ambulatory Visit: Payer: Self-pay | Admitting: Gerontology

## 2021-09-01 DIAGNOSIS — R0981 Nasal congestion: Secondary | ICD-10-CM

## 2021-09-15 ENCOUNTER — Encounter: Payer: Self-pay | Admitting: Certified Nurse Midwife

## 2021-09-23 ENCOUNTER — Encounter: Payer: Self-pay | Admitting: Certified Nurse Midwife

## 2021-09-30 ENCOUNTER — Other Ambulatory Visit: Payer: Self-pay | Admitting: Gerontology

## 2021-09-30 DIAGNOSIS — R002 Palpitations: Secondary | ICD-10-CM

## 2021-10-01 ENCOUNTER — Other Ambulatory Visit: Payer: Self-pay | Admitting: Gerontology

## 2021-10-01 DIAGNOSIS — R002 Palpitations: Secondary | ICD-10-CM

## 2021-11-03 ENCOUNTER — Encounter: Payer: Self-pay | Admitting: Certified Nurse Midwife

## 2021-11-04 ENCOUNTER — Ambulatory Visit: Payer: Self-pay | Admitting: Gerontology

## 2021-11-18 ENCOUNTER — Other Ambulatory Visit: Payer: Self-pay

## 2021-11-18 ENCOUNTER — Encounter: Payer: Self-pay | Admitting: Gerontology

## 2021-11-18 ENCOUNTER — Ambulatory Visit: Payer: Self-pay | Admitting: Gerontology

## 2021-11-18 VITALS — BP 157/99 | HR 87 | Temp 98.5°F | Ht 60.0 in | Wt 144.3 lb

## 2021-11-18 DIAGNOSIS — R82998 Other abnormal findings in urine: Secondary | ICD-10-CM

## 2021-11-18 DIAGNOSIS — R300 Dysuria: Secondary | ICD-10-CM

## 2021-11-18 LAB — POCT URINALYSIS DIPSTICK
Bilirubin, UA: NEGATIVE
Glucose, UA: NEGATIVE
Ketones, UA: NEGATIVE
Nitrite, UA: NEGATIVE
Protein, UA: POSITIVE — AB
Spec Grav, UA: 1.015 (ref 1.010–1.025)
Urobilinogen, UA: 0.2 E.U./dL
pH, UA: 7 (ref 5.0–8.0)

## 2021-11-18 MED ORDER — NITROFURANTOIN MONOHYD MACRO 100 MG PO CAPS
100.0000 mg | ORAL_CAPSULE | Freq: Two times a day (BID) | ORAL | 0 refills | Status: DC
Start: 2021-11-18 — End: 2021-12-29

## 2021-11-18 MED ORDER — NITROFURANTOIN MONOHYD MACRO 100 MG PO CAPS: 100.0000 mg | ORAL_CAPSULE | Freq: Two times a day (BID) | ORAL | 0 refills | Status: DC

## 2021-11-18 NOTE — Progress Notes (Signed)
Established Patient Office Visit  Subjective:  Patient ID: [name], female    DOB: 08-May-1976  Age: 45 y.o. MRN: [id number]  CC:  Chief Complaint  Patient presents with   Follow-up    Pt reports pain during urination, frequent urination, and 'blood in the urine this morning'    HPI [name] is a 45 y/o female w history of alcohol abuse, anxiety, depression, and hypertension presents for c/o dysuria. She reports cloudy pink urine with small, ragged red clots, dysuria, urgency, and frequency; denies fever, but does report chills and body aches. She has suprapubic pain 8/10, describes it as constant. Her symptoms all began this morning. She is not sexually active, reports her last partner was 2+ years ago. She does not menstruate. She last took antibiotics in August 2022 for sinusitis. Other than this, she states she is well and reports no additional concerns.     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   Alcohol abuse    Anxiety    Bacterial infection    Cervical intraepithelial neoplasia III    CIN III (cervical intraepithelial neoplasia grade III) with severe dysplasia 12/19/2005   Condyloma 12/19/2009   H/O fatigue 12/20/2007   H/O varicella    Headache(784.0)    HPV (human papilloma virus) anogenital infection    Hx of dizziness 09/20/2011   Hx: UTI (urinary tract infection)    Back pain   Hypertension    Irregular menstrual cycle    IV drug user    Palpitations 12/19/2008   Pelvic pain in female 12/05/2011   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    Age 50   Trichomonas    Yeast infection     Past Surgical History:  Procedure Laterality Date   A*wisdom teeth ext     CERVICAL CONIZATION W/BX  11/30/2011   Procedure: CONIZATION CERVIX WITH BIOPSY;  Surgeon: [name], MD;  Location: [facility];  Service: Gynecology;  Laterality: N/A;   CERVICAL CONIZATION W/BX  03/16/2012    Procedure: CONIZATION CERVIX WITH BIOPSY;  Surgeon: Ena Dawley, MD;  Location: [facility];  Service: Gynecology;  Laterality: N/A;   CONIZATION CERVIX     x 3   DECORTICATION Right 01/14/2021   Procedure: DECORTICATION;  Surgeon: Melrose Nakayama, MD;  Location: [facility];  Service: Thoracic;  Laterality: Right;   DILATION AND CURETTAGE OF UTERUS  11/30/2011   Procedure: DILATATION AND CURETTAGE;  Surgeon: [name], MD;  Location: [facility];  Service: Gynecology;  Laterality: N/A;   svd     x 1   VIDEO ASSISTED THORACOSCOPY (VATS)/EMPYEMA Right 01/14/2021   Procedure: VIDEO ASSISTED THORACOSCOPY (VATS)/DRAIN EMPYEMA;  Surgeon: Melrose Nakayama, MD;  Location: [facility];  Service: Thoracic;  Laterality: Right;    Family History  Problem Relation Age of Onset   Cervical cancer Mother    Breast cancer Mother    Cancer Mother 16       Breast & cervical    Thyroid disease Mother    Stroke Maternal Grandmother    COPD Maternal Grandfather        Emphysema   Hypertension Paternal Grandfather    Stroke Paternal Grandfather    Heart attack Paternal Grandfather    Cancer Father 81       prostate   Hyperlipidemia Father    Other Paternal Grandmother        "  old age" mid 69s    Social History   Socioeconomic History   Marital status: Divorced    Spouse name: Not on file   Number of children: 1   Years of education: Not on file   Highest education level: Master's degree (e.g., MA, MS, MEng, MEd, MSW, MBA)  Occupational History   Occupation: unemployed  Tobacco Use   Smoking status: Every Day    Packs/day: 0.25    Years: 30.00    Pack years: 7.50    Types: Cigarettes   Smokeless tobacco: Never  Vaping Use   Vaping Use: Never used  Substance and Sexual Activity   Alcohol use: Yes    Alcohol/week: 10.0 standard drinks    Types: 10 Standard drinks or equivalent per week    Comment: drinks ~qod (hard seltzer)   Drug use: Not Currently    Types: Heroin    Comment:  Abused rx drugs - on methadone 08/2011, stopped methadone  2007   Sexual activity: Not Currently    Birth control/protection: Abstinence  Other Topics Concern   Not on file  Social History Narrative   Work or School: clinical addiction specialist      Home Situation: lives with daughter and wife      Spiritual Beliefs: none      Lifestyle: no regular exercise; good diet          On Food stamps   Social Determinants of Health   Financial Resource Strain: Not on file  Food Insecurity: No Food Insecurity   Worried About Charity fundraiser in the Last Year: Never true   [place] in the Last Year: Never true  Transportation Needs: No Transportation Needs   Lack of Transportation (Medical): No   Lack of Transportation (Non-Medical): No  Physical Activity: Not on file  Stress: Not on file  Social Connections: Not on file  Intimate Partner Violence: Not on file    Outpatient Medications Prior to Visit  Medication Sig Dispense Refill   Biotin 10 MG CAPS Take 10 mg by mouth.     DEEP SEA NASAL SPRAY 0.65 % nasal spray PLACE 1 SPRAY INTO BOTH NOSTRILS AS NEEDED FOR CONGESTION 132 mL 0   fluticasone (FLONASE) 50 MCG/ACT nasal spray Place 1 spray into both nostrils daily. Has only used a few times 15.8 mL 0   ibuprofen (ADVIL) 200 MG tablet Take 200 mg by mouth every 6 (six) hours as needed. Taking daily     loratadine (CLARITIN) 10 MG tablet TAKE 1 TABLET(10 MG) BY MOUTH DAILY 90 tablet 0   Omega-3 Fatty Acids (FISH OIL) 1200 MG CAPS Take 1,200 mg by mouth.     propranolol (INDERAL) 40 MG tablet TAKE 1 TABLET(40 MG) BY MOUTH DAILY 90 tablet 1   No facility-administered medications prior to visit.    Allergies  Allergen Reactions   Acetaminophen     Intolerance, hx of tylenol OD     ROS Review of Systems  Constitutional:  Positive for chills. Negative for diaphoresis and fever.  Gastrointestinal:  Negative for nausea and vomiting.  Genitourinary:  Positive for  difficulty urinating, dysuria, flank pain, frequency, hematuria, pelvic pain and urgency. Negative for menstrual problem and vaginal bleeding.  Musculoskeletal:  Positive for arthralgias and myalgias.     Objective:    Physical Exam Cardiovascular:     Rate and Rhythm: Normal rate and regular rhythm.     Pulses: Normal pulses.  Heart sounds: Normal heart sounds. No murmur heard.   No gallop.  Pulmonary:     Effort: Pulmonary effort is normal.     Breath sounds: Normal breath sounds. No wheezing, rhonchi or rales.  Abdominal:     Tenderness: There is right CVA tenderness and left CVA tenderness.  Musculoskeletal:     Right lower leg: No edema.     Left lower leg: No edema.  Skin:    General: Skin is warm and dry.     Capillary Refill: Capillary refill takes less than 2 seconds.    BP (!) 157/99   Pulse 87   Temp 98.5 F (36.9 C)   Ht 5' (1.524 m)   Wt 144 lb 4.8 oz (65.5 kg)   LMP 04/23/2019 (Approximate)   SpO2 99%   BMI 28.18 kg/m  Wt Readings from Last 3 Encounters:  11/18/21 144 lb 4.8 oz (65.5 kg)  08/19/21 145 lb 1.6 oz (65.8 kg)  08/04/21 143 lb 4.8 oz (65 kg)     Health Maintenance Due  Topic Date Due   COVID-19 Vaccine (1) Never done   Pneumococcal Vaccine 46-34 Years old (1 - PCV) Never done   TETANUS/TDAP  Never done   INFLUENZA VACCINE  Never done    There are no preventive care reminders to display for this patient.  Lab Results  Component Value Date   TSH 2.010 01/26/2021   Lab Results  Component Value Date   WBC 4.8 05/05/2021   HGB 12.0 05/05/2021   HCT 34.0 05/05/2021   MCV 95 05/05/2021   PLT 167 05/05/2021   Lab Results  Component Value Date   NA 128 (L) 07/28/2021   K 4.2 07/28/2021   CHLORIDE 105 07/21/2014   CO2 25 07/28/2021   GLUCOSE 110 (H) 07/28/2021   BUN 4 (L) 07/28/2021   CREATININE 0.53 (L) 07/28/2021   BILITOT 0.5 07/28/2021   ALKPHOS 129 (H) 07/28/2021   AST 140 (H) 07/28/2021   ALT 49 (H) 07/28/2021   PROT  8.3 07/28/2021   ALBUMIN 4.1 07/28/2021   CALCIUM 9.4 07/28/2021   ANIONGAP 8 01/19/2021   EGFR 115 07/28/2021   Lab Results  Component Value Date   CHOL 174 07/28/2021   Lab Results  Component Value Date   HDL 53 07/28/2021   Lab Results  Component Value Date   LDLCALC 104 (H) 07/28/2021   Lab Results  Component Value Date   TRIG 93 07/28/2021   Lab Results  Component Value Date   CHOLHDL 3.3 07/28/2021   Lab Results  Component Value Date   HGBA1C 5.4 02/05/2020      Assessment & Plan:   1. Dysuria - Clean catch urine sample obtained. Urine is pink and slightly cloudy. Dipstick results show +3 blood and +3 leukocytes. Will send sample for urinalysis with reflex to culture.  - Advised patient on perineal hygiene. Advised her to stay hydrated and contact clinic if she has any questions. - POCT Urinalysis Dipstick; Future - UA/M w/rflx Culture, Routine; Future - UA/M w/rflx Culture, Routine - POCT Urinalysis Dipstick  2. Leukocytes in urine - She will be treated prophylactically with Macrobid 100 mg bid x 7 days.  She was educated on medication adherence and side effects. Advised her to go to the ED and notify the clinic if her symptoms worsen. She will return in January to provide another urine sample. - nitrofurantoin, macrocrystal-monohydrate, (MACROBID) 100 MG capsule; Take 1 capsule (100 mg total) by  mouth 2 (two) times daily.  Dispense: 14 capsule; Refill: 0   Follow-up: Return in about 1 month (around 12/22/2021).    Harvin Hazel, RN

## 2021-11-18 NOTE — Patient Instructions (Signed)
Please call us next week if your symptoms have not improved.

## 2021-11-23 LAB — URINE CULTURE, REFLEX

## 2021-11-23 LAB — UA/M W/RFLX CULTURE, ROUTINE
Bilirubin, UA: NEGATIVE
Glucose, UA: NEGATIVE
Ketones, UA: NEGATIVE
Nitrite, UA: NEGATIVE
Specific Gravity, UA: <=1.005 — AB (ref 1.005–1.030)
Urobilinogen, Ur: 0.2 mg/dL (ref 0.2–1.0)
pH, UA: 7.5 (ref 5.0–7.5)

## 2021-11-23 LAB — MICROSCOPIC EXAMINATION: WBC, UA: >30 /hpf — AB (ref 0–5)

## 2021-12-22 ENCOUNTER — Other Ambulatory Visit: Payer: Self-pay

## 2021-12-22 VITALS — BP 147/92 | HR 91 | Temp 97.5°F | Ht 60.0 in | Wt 145.9 lb

## 2021-12-22 DIAGNOSIS — R300 Dysuria: Secondary | ICD-10-CM

## 2021-12-23 ENCOUNTER — Telehealth: Payer: Self-pay | Admitting: Emergency Medicine

## 2021-12-23 LAB — COMPREHENSIVE METABOLIC PANEL
ALT: 21 IU/L (ref 0–32)
AST: 47 IU/L — ABNORMAL HIGH (ref 0–40)
Albumin/Globulin Ratio: 0.9 — ABNORMAL LOW (ref 1.2–2.2)
Albumin: 3.4 g/dL — ABNORMAL LOW (ref 3.8–4.8)
Alkaline Phosphatase: 128 IU/L — ABNORMAL HIGH (ref 44–121)
BUN/Creatinine Ratio: 8 — ABNORMAL LOW (ref 9–23)
BUN: 6 mg/dL (ref 6–24)
Bilirubin Total: 0.8 mg/dL (ref 0.0–1.2)
CO2: 18 mmol/L — ABNORMAL LOW (ref 20–29)
Calcium: 8.4 mg/dL — ABNORMAL LOW (ref 8.7–10.2)
Chloride: 78 mmol/L — ABNORMAL LOW (ref 96–106)
Creatinine, Ser: 0.75 mg/dL (ref 0.57–1.00)
Globulin, Total: 4 g/dL (ref 1.5–4.5)
Glucose: 126 mg/dL — ABNORMAL HIGH (ref 70–99)
Potassium: 3.6 mmol/L (ref 3.5–5.2)
Sodium: 116 mmol/L — CL (ref 134–144)
Total Protein: 7.4 g/dL (ref 6.0–8.5)
eGFR: 99 mL/min/{1.73_m2} (ref >59)

## 2021-12-23 LAB — TSH: TSH: 1.74 u[IU]/mL (ref 0.450–4.500)

## 2021-12-23 NOTE — Telephone Encounter (Signed)
Received call from [facility] re: critical lab of Sodium 116.  Spoke with Benjamine Mola, NP and she advised that patient should go to ED for evaluation.  Called and spoke with patient, advised of above. Patient agreed and voiced understanding.

## 2021-12-29 ENCOUNTER — Other Ambulatory Visit: Payer: Self-pay

## 2021-12-29 ENCOUNTER — Ambulatory Visit: Payer: Self-pay | Admitting: Gerontology

## 2021-12-29 ENCOUNTER — Encounter: Payer: Self-pay | Admitting: Gerontology

## 2021-12-29 DIAGNOSIS — E871 Hypo-osmolality and hyponatremia: Secondary | ICD-10-CM

## 2021-12-29 NOTE — Patient Instructions (Signed)
Hyponatremia ?Hyponatremia is when the amount of salt (sodium) in your blood is too low. When salt levels are low, your body cells may take in extra water. This can cause swelling. The swelling often affects the brain. ?What are the causes? ?Certain medical problems or conditions. ?Vomiting a lot. ?Having watery poop (diarrhea) often. ?Sweating too much. ?Taking certain medicines or using illegal drugs. ?Fluids given through an IV tube. ?What increases the risk? ?Having heart, kidney, or liver failure. ?Having a medical condition that causes you to have watery poop a lot. ?Doing very hard exercises. ?Taking medicines that affect the amount of salt that is in your blood. ?What are the signs or symptoms? ?Symptoms of this condition include: ?Headache. ?Feeling like you may vomit (nausea). ?Vomiting. ?Being very tired. ?Muscle weakness and cramps. ?Not wanting to eat as much as normal. ?Feeling weak or dizzy. ?Very bad symptoms of this condition include: ?Confusion. ?Feeling restless. ?Having a fast heart rate. ?Fainting. ?Seizures. ?Coma. ?How is this treated? ?Treatment for this condition depends on the cause. Treatment may include: ?Getting fluids through an IV tube that is put into one of your veins. ?Taking medicines to fix the salt levels in your blood. If medicines are causing the problem, your medicines will need to be changed. ?Limiting how much water or fluid you take in, in some cases. ?Monitoring in the hospital to watch your symptoms. ?Follow these instructions at home: ? ?Take over-the-counter and prescription medicines only as told by your doctor. Many medicines can make this condition worse. Talk with your doctor about any medicines that you are taking. ?Do not drink alcohol. ?Keep all follow-up visits. ?Contact a doctor if: ?You feel more like you may vomit. ?You feel more tired. ?Your headache gets worse. ?You feel more confused. ?You feel weaker. ?Your symptoms go away and then they come back. ?Get  help right away if: ?You have a seizure. ?You faint. ?You keep having watery poop. ?You keep vomiting. ?Summary ?Hyponatremia is when the amount of salt in your blood is too low. ?When salt levels are low, you can have swelling throughout the body. The swelling mostly affects the brain. ?Treatment depends on the cause. ?Treatment may include IV fluids and changing medicines. ?This information is not intended to replace advice given to you by your health care provider. Make sure you discuss any questions you have with your health care provider. ?Document Revised: 06/15/2021 Document Reviewed: 06/15/2021 ?Elsevier Patient Education ? 2022 Elsevier Inc[employer] ? ?

## 2021-12-29 NOTE — Progress Notes (Addendum)
[name] promised to go to the ED yesterday after clinic visit. Writer noticed that she has not been to the ED as promised, she didn't answer her telephone this time.  Established Patient Office Visit  Subjective:  Patient ID: [name], female    DOB: 1975-12-27  Age: 46 y.o. MRN: [id number]  CC:  Chief Complaint  Patient presents with   Follow-up    HPI [name]  is a 46 y/o female w history of alcohol abuse, anxiety, depression, and hypertension presents for lab review. Her Sodium done on 12/22/21 was 116 mmol/L, she was advised to go to the ED, she states that she will go this evening. She denies nausea, headache and malaise. She was unable to remember how much alcohol she consumes every day, but states that she has cut back. She denies tremors, and withdrawal symptoms. Overall, she states that she's doing well and will go to the ED this evening because she has a ride.  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   Alcohol abuse    Anxiety    Bacterial infection    Cervical intraepithelial neoplasia III    CIN III (cervical intraepithelial neoplasia grade III) with severe dysplasia 12/19/2005   Condyloma 12/19/2009   H/O fatigue 12/20/2007   H/O varicella    Headache(784.0)    HPV (human papilloma virus) anogenital infection    Hx of dizziness 09/20/2011   Hx: UTI (urinary tract infection)    Back pain   Hypertension    Irregular menstrual cycle    IV drug user    Palpitations 12/19/2008   Pelvic pain in female 12/05/2011   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    Age 57   Trichomonas    Yeast infection     Past Surgical History:  Procedure Laterality Date   A*wisdom teeth ext     CERVICAL CONIZATION W/BX  11/30/2011   Procedure: CONIZATION CERVIX WITH BIOPSY;  Surgeon: [name], MD;  Location: [facility];  Service: Gynecology;  Laterality: N/A;   CERVICAL CONIZATION W/BX   03/16/2012   Procedure: CONIZATION CERVIX WITH BIOPSY;  Surgeon: Ena Dawley, MD;  Location: [facility];  Service: Gynecology;  Laterality: N/A;   CONIZATION CERVIX     x 3   DECORTICATION Right 01/14/2021   Procedure: DECORTICATION;  Surgeon: Melrose Nakayama, MD;  Location: [facility];  Service: Thoracic;  Laterality: Right;   DILATION AND CURETTAGE OF UTERUS  11/30/2011   Procedure: DILATATION AND CURETTAGE;  Surgeon: [name], MD;  Location: [facility];  Service: Gynecology;  Laterality: N/A;   svd     x 1   VIDEO ASSISTED THORACOSCOPY (VATS)/EMPYEMA Right 01/14/2021   Procedure: VIDEO ASSISTED THORACOSCOPY (VATS)/DRAIN EMPYEMA;  Surgeon: Melrose Nakayama, MD;  Location: [facility];  Service: Thoracic;  Laterality: Right;    Family History  Problem Relation Age of Onset   Cervical cancer Mother    Breast cancer Mother    Cancer Mother 63       Breast & cervical    Thyroid disease Mother    Stroke Maternal Grandmother    COPD Maternal Grandfather        Emphysema   Hypertension Paternal Grandfather    Stroke Paternal Grandfather    Heart attack Paternal Grandfather    Cancer Father 36       prostate  Hyperlipidemia Father    Other Paternal 34        "old age" mid 62s    Social History   Socioeconomic History   Marital status: Divorced    Spouse name: Not on file   Number of children: 1   Years of education: Not on file   Highest education level: Master's degree (e.g., MA, MS, MEng, MEd, MSW, MBA)  Occupational History   Occupation: unemployed  Tobacco Use   Smoking status: Every Day    Packs/day: 0.25    Years: 30.00    Pack years: 7.50    Types: Cigarettes   Smokeless tobacco: Never  Vaping Use   Vaping Use: Never used  Substance and Sexual Activity   Alcohol use: Yes    Alcohol/week: 10.0 standard drinks    Types: 10 Standard drinks or equivalent per week    Comment: drinks ~qod (hard seltzer)   Drug use: Not Currently    Types: Heroin     Comment: Abused rx drugs - on methadone 08/2011, stopped methadone  2007   Sexual activity: Not Currently    Birth control/protection: Abstinence  Other Topics Concern   Not on file  Social History Narrative   Work or School: clinical addiction specialist      Home Situation: lives with daughter and wife      Spiritual Beliefs: none      Lifestyle: no regular exercise; good diet          On Food stamps   Social Determinants of Health   Financial Resource Strain: Not on file  Food Insecurity: No Food Insecurity   Worried About Charity fundraiser in the Last Year: Never true   [place] in the Last Year: Never true  Transportation Needs: No Transportation Needs   Lack of Transportation (Medical): No   Lack of Transportation (Non-Medical): No  Physical Activity: Not on file  Stress: Not on file  Social Connections: Not on file  Intimate Partner Violence: Not on file    Outpatient Medications Prior to Visit  Medication Sig Dispense Refill   DEEP SEA NASAL SPRAY 0.65 % nasal spray PLACE 1 SPRAY INTO BOTH NOSTRILS AS NEEDED FOR CONGESTION 132 mL 0   Omega-3 Fatty Acids (FISH OIL) 1200 MG CAPS Take 1,200 mg by mouth.     propranolol (INDERAL) 40 MG tablet TAKE 1 TABLET(40 MG) BY MOUTH DAILY 90 tablet 1   ibuprofen (ADVIL) 200 MG tablet Take 200 mg by mouth every 6 (six) hours as needed. Taking daily     Biotin 10 MG CAPS Take 10 mg by mouth. (Patient not taking: Reported on 12/29/2021)     fluticasone (FLONASE) 50 MCG/ACT nasal spray Place 1 spray into both nostrils daily. Has only used a few times (Patient not taking: Reported on 12/29/2021) 15.8 mL 0   loratadine (CLARITIN) 10 MG tablet TAKE 1 TABLET(10 MG) BY MOUTH DAILY (Patient not taking: Reported on 12/29/2021) 90 tablet 0   nitrofurantoin, macrocrystal-monohydrate, (MACROBID) 100 MG capsule Take 1 capsule (100 mg total) by mouth 2 (two) times daily. 14 capsule 0   No facility-administered medications prior to visit.     Allergies  Allergen Reactions   Acetaminophen     Intolerance, hx of tylenol OD     ROS Review of Systems  Constitutional: Negative.   Eyes: Negative.   Respiratory: Negative.    Cardiovascular: Negative.   Skin: Negative.   Neurological: Negative.   Psychiatric/Behavioral: Negative.  Objective:    Physical Exam HENT:     Head: Normocephalic and atraumatic.     Mouth/Throat:     Mouth: Mucous membranes are moist.  Eyes:     Extraocular Movements: Extraocular movements intact.     Conjunctiva/sclera: Conjunctivae normal.     Pupils: Pupils are equal, round, and reactive to light.  Cardiovascular:     Rate and Rhythm: Normal rate and regular rhythm.     Pulses: Normal pulses.     Heart sounds: Normal heart sounds.  Pulmonary:     Effort: Pulmonary effort is normal.     Breath sounds: Normal breath sounds.  Neurological:     General: No focal deficit present.     Mental Status: She is alert and oriented to person, place, and time. Mental status is at baseline.  Psychiatric:        Mood and Affect: Mood normal.        Behavior: Behavior normal.        Thought Content: Thought content normal.        Judgment: Judgment normal.    BP 115/70 (BP Location: Left Arm, Patient Position: Sitting, Cuff Size: Normal)    Pulse 100    Temp 97.8 F (36.6 C) (Oral)    Resp 16    Ht 5' (1.524 m)    Wt 138 lb 12.8 oz (63 kg)    LMP 04/23/2019 (Approximate)    SpO2 98%    BMI 27.11 kg/m  Wt Readings from Last 3 Encounters:  12/29/21 138 lb 12.8 oz (63 kg)  12/22/21 145 lb 14.4 oz (66.2 kg)  11/18/21 144 lb 4.8 oz (65.5 kg)     Health Maintenance Due  Topic Date Due   COVID-19 Vaccine (1) Never done   Pneumococcal Vaccine 81-34 Years old (1 - PCV) Never done   TETANUS/TDAP  Never done   INFLUENZA VACCINE  Never done    There are no preventive care reminders to display for this patient.  Lab Results  Component Value Date   TSH 1.740 12/22/2021   Lab Results   Component Value Date   WBC 4.8 05/05/2021   HGB 12.0 05/05/2021   HCT 34.0 05/05/2021   MCV 95 05/05/2021   PLT 167 05/05/2021   Lab Results  Component Value Date   NA 116 (LL) 12/22/2021   K 3.6 12/22/2021   CHLORIDE 105 07/21/2014   CO2 18 (L) 12/22/2021   GLUCOSE 126 (H) 12/22/2021   BUN 6 12/22/2021   CREATININE 0.75 12/22/2021   BILITOT 0.8 12/22/2021   ALKPHOS 128 (H) 12/22/2021   AST 47 (H) 12/22/2021   ALT 21 12/22/2021   PROT 7.4 12/22/2021   ALBUMIN 3.4 (L) 12/22/2021   CALCIUM 8.4 (L) 12/22/2021   ANIONGAP 8 01/19/2021   EGFR 99 12/22/2021   Lab Results  Component Value Date   CHOL 174 07/28/2021   Lab Results  Component Value Date   HDL 53 07/28/2021   Lab Results  Component Value Date   LDLCALC 104 (H) 07/28/2021   Lab Results  Component Value Date   TRIG 93 07/28/2021   Lab Results  Component Value Date   CHOLHDL 3.3 07/28/2021   Lab Results  Component Value Date   HGBA1C 5.4 02/05/2020      Assessment & Plan:   1. Hyponatremia - Her sodium level was 116 mmol/L, and was strongly encouraged to go to the ED and she verbalized understanding.  Follow-up: Return in about 5 weeks (around 02/03/2022), or if symptoms worsen or fail to improve.    Alenah Sarria Jerold Coombe, NP

## 2022-02-03 ENCOUNTER — Ambulatory Visit: Payer: Self-pay | Admitting: Gerontology

## 2022-02-10 ENCOUNTER — Ambulatory Visit: Payer: Self-pay | Admitting: Gerontology

## 2022-03-02 ENCOUNTER — Other Ambulatory Visit: Payer: Self-pay

## 2022-03-02 ENCOUNTER — Ambulatory Visit: Payer: Self-pay | Admitting: Gerontology

## 2022-03-02 ENCOUNTER — Encounter: Payer: Self-pay | Admitting: Gerontology

## 2022-03-02 VITALS — BP 119/80 | HR 81 | Temp 98.1°F | Ht 60.0 in | Wt 142.0 lb

## 2022-03-02 DIAGNOSIS — E871 Hypo-osmolality and hyponatremia: Secondary | ICD-10-CM

## 2022-03-02 DIAGNOSIS — R002 Palpitations: Secondary | ICD-10-CM

## 2022-03-02 DIAGNOSIS — Z889 Allergy status to unspecified drugs, medicaments and biological substances status: Secondary | ICD-10-CM

## 2022-03-02 MED ORDER — CETIRIZINE HCL 10 MG PO TABS
10.0000 mg | ORAL_TABLET | Freq: Every day | ORAL | 3 refills | Status: DC
Start: 2022-03-02 — End: 2023-03-30

## 2022-03-02 MED ORDER — PROPRANOLOL HCL 40 MG PO TABS: ORAL_TABLET | ORAL | 1 refills | Status: DC

## 2022-03-02 NOTE — Progress Notes (Signed)
? ?Established Patient Office Visit ? ?Subjective:  ?Patient ID: [name], female    DOB: 05-09-1976  Age: 46 y.o. MRN: [id number] ? ?CC:  ?Chief Complaint  ?Patient presents with  ? Follow-up  ?  Pt wants refill on proanolol. Wants referral to allergist. Tried OTC Zyretc and Flonase with no relief  ? ? ?HPI ?[name]   is a 46 y/o female w history of alcohol abuse, anxiety, depression, and hypertension presents for medication refill and c/o worsening seasonal allergy. She c/o  intermittent postnasal drip, rhinorrhea, sneezing due to seasonal allergy. She states that she tried Claritin without relief. She no showed to her lab recheck for hyponatremia of 116 mmol/L and she declined going to the ED, stating that she was asymptomatic. Overall, she states that she's doing well and offers no further complaint. ? ?Past Medical History:  ?Diagnosis Date  ? Abnormal Pap smear   ? Age 9  ? 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 Alcohol abuse   ? Anxiety   ? Bacterial infection   ? Cervical intraepithelial neoplasia III   ? CIN III (cervical intraepithelial neoplasia grade III) with severe dysplasia 12/19/2005  ? Condyloma 12/19/2009  ? H/O fatigue 12/20/2007  ? H/O varicella   ? Headache(784.0)   ? HPV (human papilloma virus) anogenital infection   ? Hx of dizziness 09/20/2011  ? Hx: UTI (urinary tract infection)   ? Back pain  ? Hypertension   ? Irregular menstrual cycle   ? IV drug user   ? Palpitations 12/19/2008  ? Pelvic pain in female 12/05/2011  ? 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   ? Age 73  ? Trichomonas   ? Yeast infection   ? ? ?Past Surgical History:  ?Procedure Laterality Date  ? A*wisdom teeth ext    ? CERVICAL CONIZATION W/BX  11/30/2011  ? Procedure: CONIZATION CERVIX WITH BIOPSY;  Surgeon: [name], MD;  Location: [facility];  Service: Gynecology;  Laterality: N/A;  ? CERVICAL CONIZATION W/BX  03/16/2012  ? Procedure: CONIZATION CERVIX WITH BIOPSY;  Surgeon: Ena Dawley, MD;   Location: [facility];  Service: Gynecology;  Laterality: N/A;  ? CONIZATION CERVIX    ? x 3  ? DECORTICATION Right 01/14/2021  ? Procedure: DECORTICATION;  Surgeon: Melrose Nakayama, MD;  Location: [facility];  Service: Thoracic;  Laterality: Right;  ? DILATION AND CURETTAGE OF UTERUS  11/30/2011  ? Procedure: DILATATION AND CURETTAGE;  Surgeon: [name], MD;  Location: [facility];  Service: Gynecology;  Laterality: N/A;  ? svd    ? x 1  ? VIDEO ASSISTED THORACOSCOPY (VATS)/EMPYEMA Right 01/14/2021  ? Procedure: VIDEO ASSISTED THORACOSCOPY (VATS)/DRAIN EMPYEMA;  Surgeon: Melrose Nakayama, MD;  Location: [facility];  Service: Thoracic;  Laterality: Right;  ? ? ?Family History  ?Problem Relation Age of Onset  ? Cervical cancer Mother   ? Breast cancer Mother   ? Cancer Mother 84  ?     Breast & cervical   ? Thyroid disease Mother   ? Stroke Maternal Grandmother   ? COPD Maternal Grandfather   ?     Emphysema  ? Hypertension Paternal Grandfather   ? Stroke Paternal Grandfather   ? Heart attack Paternal Grandfather   ? Cancer Father 77  ?     prostate  ? Hyperlipidemia Father   ? Other Paternal Grandmother   ?     "old age" mid 63s  ? ? ?Social  History  ? ?Socioeconomic History  ? Marital status: Divorced  ?  Spouse name: Not on file  ? Number of children: 1  ? Years of education: Not on file  ? Highest education level: Master's degree (e.g., MA, MS, MEng, MEd, MSW, MBA)  ?Occupational History  ? Occupation: unemployed  ?Tobacco Use  ? Smoking status: Every Day  ?  Packs/day: 0.25  ?  Years: 30.00  ?  Pack years: 7.50  ?  Types: Cigarettes  ? Smokeless tobacco: Never  ?Vaping Use  ? Vaping Use: Never used  ?Substance and Sexual Activity  ? Alcohol use: Yes  ?  Alcohol/week: 10.0 standard drinks  ?  Types: 10 Standard drinks or equivalent per week  ?  Comment: drinks ~qod (hard seltzer)  ? Drug use: Not Currently  ?  Types: Heroin  ?  Comment: Abused rx drugs - on methadone 08/2011, stopped methadone  2007  ? Sexual  activity: Not Currently  ?  Birth control/protection: Abstinence  ?Other Topics Concern  ? Not on file  ?Social History Narrative  ? Work or School: clinical addiction specialist  ?   ? Home Situation: lives with daughter and wife  ?   ? Spiritual Beliefs: none  ?   ? Lifestyle: no regular exercise; good diet  ?   ?   ?  On Food stamps  ? ?Social Determinants of Health  ? ?Financial Resource Strain: Not on file  ?Food Insecurity: No Food Insecurity  ? Worried About Charity fundraiser in the Last Year: Never true  ? Ran Out of Food in the Last Year: Never true  ?Transportation Needs: No Transportation Needs  ? Lack of Transportation (Medical): No  ? Lack of Transportation (Non-Medical): No  ?Physical Activity: Not on file  ?Stress: Not on file  ?Social Connections: Not on file  ?Intimate Partner Violence: Not on file  ? ? ?Outpatient Medications Prior to Visit  ?Medication Sig Dispense Refill  ? Omega-3 Fatty Acids (FISH OIL) 1200 MG CAPS Take 1,200 mg by mouth.    ? propranolol (INDERAL) 40 MG tablet TAKE 1 TABLET(40 MG) BY MOUTH DAILY 90 tablet 1  ? Biotin 10 MG CAPS Take 10 mg by mouth. (Patient not taking: Reported on 12/29/2021)    ? DEEP SEA NASAL SPRAY 0.65 % nasal spray PLACE 1 SPRAY INTO BOTH NOSTRILS AS NEEDED FOR CONGESTION (Patient not taking: Reported on 03/02/2022) 132 mL 0  ? fluticasone (FLONASE) 50 MCG/ACT nasal spray Place 1 spray into both nostrils daily. Has only used a few times (Patient not taking: Reported on 12/29/2021) 15.8 mL 0  ? ?No facility-administered medications prior to visit.  ? ? ?Allergies  ?Allergen Reactions  ? Acetaminophen   ?  Intolerance, hx of tylenol OD   ? ? ?ROS ?Review of Systems  ?Constitutional: Negative.   ?HENT:  Positive for postnasal drip, rhinorrhea and sneezing.   ?Eyes: Negative.   ?Respiratory: Negative.    ?Cardiovascular: Negative.   ?Neurological: Negative.   ?Psychiatric/Behavioral: Negative.    ? ?  ?Objective:  ?  ?Physical Exam ?HENT:  ?   Head:  Normocephalic and atraumatic.  ?   Mouth/Throat:  ?   Mouth: Mucous membranes are moist.  ?Eyes:  ?   Extraocular Movements: Extraocular movements intact.  ?   Conjunctiva/sclera: Conjunctivae normal.  ?   Pupils: Pupils are equal, round, and reactive to light.  ?Cardiovascular:  ?   Rate and Rhythm: Normal rate and  regular rhythm.  ?   Pulses: Normal pulses.  ?   Heart sounds: Normal heart sounds.  ?Pulmonary:  ?   Effort: Pulmonary effort is normal.  ?   Breath sounds: Normal breath sounds.  ?Neurological:  ?   General: No focal deficit present.  ?   Mental Status: She is alert and oriented to person, place, and time. Mental status is at baseline.  ?Psychiatric:     ?   Mood and Affect: Mood normal.     ?   Behavior: Behavior normal.     ?   Thought Content: Thought content normal.     ?   Judgment: Judgment normal.  ? ? ?BP 119/80 (BP Location: Left Arm, Patient Position: Sitting, Cuff Size: Normal)   Pulse 81   Temp 98.1 ?F (36.7 ?C)   Ht 5' (1.524 m)   Wt 142 lb (64.4 kg)   LMP 04/23/2019 (Approximate)   SpO2 98%   BMI 27.73 kg/m?  ?Wt Readings from Last 3 Encounters:  ?03/02/22 142 lb (64.4 kg)  ?12/29/21 138 lb 12.8 oz (63 kg)  ?12/22/21 145 lb 14.4 oz (66.2 kg)  ? ? ? ?Health Maintenance Due  ?Topic Date Due  ? COVID-19 Vaccine (1) Never done  ? TETANUS/TDAP  Never done  ? INFLUENZA VACCINE  Never done  ? ? ?There are no preventive care reminders to display for this patient. ? ?Lab Results  ?Component Value Date  ? TSH 1.740 12/22/2021  ? ?Lab Results  ?Component Value Date  ? WBC 4.8 05/05/2021  ? HGB 12.0 05/05/2021  ? HCT 34.0 05/05/2021  ? MCV 95 05/05/2021  ? PLT 167 05/05/2021  ? ?Lab Results  ?Component Value Date  ? NA 116 (LL) 12/22/2021  ? K 3.6 12/22/2021  ? CHLORIDE 105 07/21/2014  ? CO2 18 (L) 12/22/2021  ? GLUCOSE 126 (H) 12/22/2021  ? BUN 6 12/22/2021  ? CREATININE 0.75 12/22/2021  ? BILITOT 0.8 12/22/2021  ? ALKPHOS 128 (H) 12/22/2021  ? AST 47 (H) 12/22/2021  ? ALT 21 12/22/2021  ?  PROT 7.4 12/22/2021  ? ALBUMIN 3.4 (L) 12/22/2021  ? CALCIUM 8.4 (L) 12/22/2021  ? ANIONGAP 8 01/19/2021  ? EGFR 99 12/22/2021  ? ?Lab Results  ?Component Value Date  ? CHOL 174 07/28/2021  ? ?Lab Results  ?Component Value Dat

## 2022-05-04 ENCOUNTER — Ambulatory Visit: Payer: Self-pay | Admitting: Allergy

## 2022-05-19 ENCOUNTER — Other Ambulatory Visit: Payer: Self-pay

## 2022-05-26 ENCOUNTER — Other Ambulatory Visit: Payer: Self-pay

## 2022-05-26 ENCOUNTER — Other Ambulatory Visit: Payer: Self-pay | Admitting: Emergency Medicine

## 2022-05-26 ENCOUNTER — Ambulatory Visit: Payer: Self-pay | Admitting: Gerontology

## 2022-05-26 DIAGNOSIS — E871 Hypo-osmolality and hyponatremia: Secondary | ICD-10-CM

## 2022-05-27 LAB — COMPREHENSIVE METABOLIC PANEL
ALT: 39 IU/L — ABNORMAL HIGH (ref 0–32)
AST: 99 IU/L — ABNORMAL HIGH (ref 0–40)
Albumin/Globulin Ratio: 1 — ABNORMAL LOW (ref 1.2–2.2)
Albumin: 4.1 g/dL (ref 3.8–4.8)
Alkaline Phosphatase: 126 IU/L — ABNORMAL HIGH (ref 44–121)
BUN/Creatinine Ratio: 5 — ABNORMAL LOW (ref 9–23)
BUN: 3 mg/dL — ABNORMAL LOW (ref 6–24)
Bilirubin Total: 0.8 mg/dL (ref 0.0–1.2)
CO2: 20 mmol/L (ref 20–29)
Calcium: 9 mg/dL (ref 8.7–10.2)
Chloride: 89 mmol/L — ABNORMAL LOW (ref 96–106)
Creatinine, Ser: 0.64 mg/dL (ref 0.57–1.00)
Globulin, Total: 4.2 g/dL (ref 1.5–4.5)
Glucose: 117 mg/dL — ABNORMAL HIGH (ref 70–99)
Potassium: 4.1 mmol/L (ref 3.5–5.2)
Sodium: 129 mmol/L — ABNORMAL LOW (ref 134–144)
Total Protein: 8.3 g/dL (ref 6.0–8.5)
eGFR: 110 mL/min/{1.73_m2} (ref >59)

## 2022-06-02 ENCOUNTER — Encounter: Payer: Self-pay | Admitting: Gerontology

## 2022-06-02 ENCOUNTER — Ambulatory Visit: Payer: Self-pay | Admitting: Gerontology

## 2022-06-02 VITALS — BP 149/49 | HR 86 | Temp 98.0°F | Resp 16 | Ht 60.0 in | Wt 146.0 lb

## 2022-06-02 DIAGNOSIS — R945 Abnormal results of liver function studies: Secondary | ICD-10-CM

## 2022-06-02 DIAGNOSIS — R600 Localized edema: Secondary | ICD-10-CM

## 2022-06-02 DIAGNOSIS — F1010 Alcohol abuse, uncomplicated: Secondary | ICD-10-CM

## 2022-06-02 DIAGNOSIS — E871 Hypo-osmolality and hyponatremia: Secondary | ICD-10-CM

## 2022-06-02 NOTE — Patient Instructions (Signed)
Alcohol Abuse and Dependence Information, Adult Alcohol is a widely available drug. People drink alcohol in different amounts. People who drink alcohol very often and in large amounts often have problems during and after drinking. They may develop what is called an alcohol use disorder. There are two main types of alcohol use disorders: Alcohol abuse. This is when you use alcohol too much or too often. You may use alcohol to make yourself feel happy or to reduce stress. You may have a hard time setting a limit on the amount you drink. Alcohol dependence. This is when you use alcohol consistently for a period of time, and your body changes as a result. This can make it hard to stop drinking because you may start to feel sick or feel different when you do not use alcohol. These symptoms are known as withdrawal. How can alcohol abuse and dependence affect me? Alcohol abuse and dependence can have a negative effect on your life. Drinking too much can lead to addiction. You may feel like you need alcohol to function normally. You may drink alcohol before work in the morning, during the day, or as soon as you get home from work in the evening. These actions can result in: Poor work performance. Job loss. Financial problems. Car crashes or criminal charges from driving after drinking alcohol. Problems in your relationships with friends and family. Losing the trust and respect of coworkers, friends, and family. Drinking heavily over a long period of time can permanently damage your body and brain, and can cause lifelong health issues, such as: Damage to your liver or pancreas. Heart problems, high blood pressure, or stroke. Certain cancers. Decreased ability to fight infections. Brain or nerve damage. Depression. Early (premature) death. If you are careless or you crave alcohol, it is easy to drink more than your body can handle (overdose). Alcohol overdose is a serious situation that requires  hospitalization. It may lead to permanent injuries or death. What can increase my risk? Having a family history of alcohol abuse. Having depression or other mental health conditions. Beginning to drink at an early age. Binge drinking often. Experiencing trauma, stress, and an unstable home life during childhood. Spending time with people who drink often. What actions can I take to prevent or manage alcohol abuse and dependence? Do not drink alcohol if: Your health care provider tells you not to drink. You are pregnant, may be pregnant, or are planning to become pregnant. If you drink alcohol: Limit how much you use to: 0-1 drink a day for women. 0-2 drinks a day for men. Be aware of how much alcohol is in your drink. In the [place] one drink equals one 12 oz bottle of beer (355 mL), one 5 oz glass of wine (148 mL), or one 1 oz glass of hard liquor (44 mL). Stop drinking if you have been drinking too much. This can be very hard to do if you are used to abusing alcohol. If you begin to have withdrawal symptoms, talk with your health care provider or a person that you trust. These symptoms may include anxiety, shaky hands, headache, nausea, sweating, or not being able to sleep. Choose to drink nonalcoholic beverages in social gatherings and places where there may be alcohol. Activity Spend more time on activities that you enjoy that do not involve alcohol, like hobbies or exercise. Find healthy ways to cope with stress, such as exercise, meditation, or spending time with people you care about. General information Talk to your family,   coworkers, and friends about supporting you in your efforts to stop drinking. If they drink, ask them not to drink around you. Spend more time with people who do not drink alcohol. If you think that you have an alcohol dependency problem: Tell friends or family about your concerns. Talk with your health care provider or another health professional about where to  get help. Work with a therapist and a chemical dependency counselor. Consider joining a support group for people who struggle with alcohol abuse and dependence. Where to find support  Your health care provider. SMART Recovery: www.smartrecovery.org Therapy and support groups Local treatment centers or chemical dependency counselors. Local AA groups in your community: www.aa.org Where to find more information Centers for Disease Control and Prevention: www.cdc.gov National Institute on Alcohol Abuse and Alcoholism: www.niaaa.nih.gov Alcoholics Anonymous (AA): www.aa.org Contact a health care provider if: You drank more or for longer than you intended on more than one occasion. You tried to stop drinking or to cut back on how much you drink, but you were not able to. You often drink to the point of vomiting or passing out. You want to drink so badly that you cannot think about anything else. You have problems in your life due to drinking, but you continue to drink. You keep drinking even though you feel anxious, depressed, or have experienced memory loss. You have stopped doing the things you used to enjoy in order to drink. You have to drink more than you used to in order to get the effect you want. You experience anxiety, sweating, nausea, shakiness, and trouble sleeping when you try to stop drinking. Get help right away if: You have thoughts about hurting yourself or others. You have serious withdrawal symptoms, including: Confusion. Racing heart. High blood pressure. Fever. If you ever feel like you may hurt yourself or others, or have thoughts about taking your own life, get help right away. You can go to your nearest emergency department or call: Your local emergency services (911 in the [place] A suicide crisis helpline, such as the National Suicide Prevention Lifeline at 1-800-273-8255 or 988 in the [place] This is open 24 hours a day. Summary Alcohol abuse and dependence can  have a negative effect on your life. Drinking too much or too often can lead to addiction. If you drink alcohol, limit how much you use. If you are having trouble keeping your drinking under control, find ways to change your behavior. Hobbies, calming activities, exercise, or support groups can help. If you feel you need help with changing your drinking habits, talk with your health care provider, a good friend, or a therapist, or go to an AA group. This information is not intended to replace advice given to you by your health care provider. Make sure you discuss any questions you have with your health care provider. Document Revised: 10/29/2021 Document Reviewed: 02/12/2019 Elsevier Patient Education  2023 Elsevier Inc.  

## 2022-06-02 NOTE — Progress Notes (Signed)
Established Patient Office Visit  Subjective   Patient ID: [name], female    DOB: Sep 16, 1976  Age: 46 y.o. MRN: [id number]  Chief Complaint  Patient presents with   Follow-up    Follow up on lab work done last week and swelling in legs.     HPI  [name]   is a 46 y/o female w history of alcohol abuse, anxiety, depression, and hypertension presents for routine follow up and lab review. Her Chemistry done on 05/26/22, sodium increased from 116 mmol/L to 129 mmol/L, her AST increased from 47 IU/L to 99 IU/L and ALT was 39 IU/L. She states that she continues to drink  2  (12 ounces) bottles of alcoholic seltzer daily. 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 She denies withdrawal symptoms.  She denies right upper quadrant abdominal pain, and jaundice.Her ankles are swollen, but she has not being wearing her compression stocking. She denies claudication, and discoloration. Overall, she states that she's doing well and offers no further complaint.  Review of Systems  Constitutional: Negative.   Respiratory: Negative.    Cardiovascular:  Positive for leg swelling.  Skin: Negative.   Neurological: Negative.   Psychiatric/Behavioral: Negative.        Objective:     BP (!) 149/49 (BP Location: Left Arm, Patient Position: Sitting, Cuff Size: Small) Comment: retaken again 133/84  Pulse 86   Temp 98 F (36.7 C) (Oral)   Resp 16   Ht 5' (1.524 m)   Wt 146 lb (66.2 kg)   LMP 04/23/2019 (Approximate)   SpO2 99%   BMI 28.51 kg/m  BP Readings from Last 3 Encounters:  06/02/22 (!) 149/49  03/02/22 119/80  12/29/21 115/70   Wt Readings from Last 3 Encounters:  06/02/22 146 lb (66.2 kg)  03/02/22 142 lb (64.4 kg)  12/29/21 138 lb 12.8 oz (63 kg)      Physical Exam HENT:     Head: Normocephalic and atraumatic.  Eyes:     Extraocular  Movements: Extraocular movements intact.     Conjunctiva/sclera: Conjunctivae normal.     Pupils: Pupils are equal, round, and reactive to light.  Cardiovascular:     Rate and Rhythm: Normal rate and regular rhythm.     Pulses: Normal pulses.     Heart sounds: Normal heart sounds.  Pulmonary:     Effort: Pulmonary effort is normal.     Breath sounds: Normal breath sounds.  Musculoskeletal:     Left lower leg: Edema (+1edema to BLE) present.  Neurological:     General: No focal deficit present.     Mental Status: She is alert and oriented to person, place, and time. Mental status is at baseline.      No results found for any visits on 06/02/22.  Last CBC Lab Results  Component Value Date   WBC 4.8 05/05/2021   HGB 12.0 05/05/2021   HCT 34.0 05/05/2021   MCV 95 05/05/2021   MCH 33.4 (H) 05/05/2021   RDW 12.8 05/05/2021   PLT 167 99/83/3825   Last metabolic panel Lab Results  Component Value Date   GLUCOSE 117 (H) 05/26/2022   NA 129 (L) 05/26/2022   K 4.1 05/26/2022   CL 89 (L) 05/26/2022   CO2 20 05/26/2022   BUN 3 (L) 05/26/2022   CREATININE 0.64 05/26/2022   EGFR  110 05/26/2022   CALCIUM 9.0 05/26/2022   PHOS 3.2 01/10/2021   PROT 8.3 05/26/2022   ALBUMIN 4.1 05/26/2022   LABGLOB 4.2 05/26/2022   AGRATIO 1.0 (L) 05/26/2022   BILITOT 0.8 05/26/2022   ALKPHOS 126 (H) 05/26/2022   AST 99 (H) 05/26/2022   ALT 39 (H) 05/26/2022   ANIONGAP 8 01/19/2021   Last lipids Lab Results  Component Value Date   CHOL 174 07/28/2021   HDL 53 07/28/2021   LDLCALC 104 (H) 07/28/2021   TRIG 93 07/28/2021   CHOLHDL 3.3 07/28/2021   Last thyroid functions Lab Results  Component Value Date   TSH 1.740 12/22/2021      The 10-year ASCVD risk score (Arnett DK, et al., 2019) is: 4.7%    Assessment & Plan:    1. Alcohol abuse - She was strongly encouraged on Alcohol abstinence  2. Hyponatremia - No symptoms, sodium is improving , she was strongly encouraged on  alcohol abstinence. - Basic Metabolic Panel (BMET); Future  3. Liver function study, abnormal - Her liver enzymes are elevated, encouraged on Alcohol abstinence  4. Bilateral lower extremity edema - She was encouraged to wear compression stockings daily and notify clinic and go to the ED for worsening symptoms.   Return in about 13 weeks (around 09/01/2022), or if symptoms worsen or fail to improve.    Nadeem Romanoski Jerold Coombe, NP

## 2022-08-25 ENCOUNTER — Other Ambulatory Visit: Payer: Self-pay

## 2022-09-01 ENCOUNTER — Other Ambulatory Visit: Payer: Self-pay

## 2022-09-01 ENCOUNTER — Ambulatory Visit: Payer: Self-pay | Admitting: Gerontology

## 2022-09-01 DIAGNOSIS — E871 Hypo-osmolality and hyponatremia: Secondary | ICD-10-CM

## 2022-09-02 LAB — BASIC METABOLIC PANEL
BUN/Creatinine Ratio: 8 — ABNORMAL LOW (ref 9–23)
BUN: 5 mg/dL — ABNORMAL LOW (ref 6–24)
CO2: 24 mmol/L (ref 20–29)
Calcium: 9.2 mg/dL (ref 8.7–10.2)
Chloride: 92 mmol/L — ABNORMAL LOW (ref 96–106)
Creatinine, Ser: 0.61 mg/dL (ref 0.57–1.00)
Glucose: 112 mg/dL — ABNORMAL HIGH (ref 70–99)
Potassium: 4 mmol/L (ref 3.5–5.2)
Sodium: 131 mmol/L — ABNORMAL LOW (ref 134–144)
eGFR: 110 mL/min/{1.73_m2} (ref >59)

## 2022-09-05 ENCOUNTER — Other Ambulatory Visit: Payer: Self-pay | Admitting: Gerontology

## 2022-09-05 DIAGNOSIS — R002 Palpitations: Secondary | ICD-10-CM

## 2022-09-08 ENCOUNTER — Ambulatory Visit: Payer: Self-pay | Admitting: Gerontology

## 2022-09-08 VITALS — BP 168/101 | HR 77 | Temp 97.7°F | Resp 14 | Ht 60.0 in | Wt 146.6 lb

## 2022-09-08 DIAGNOSIS — R600 Localized edema: Secondary | ICD-10-CM

## 2022-09-08 DIAGNOSIS — B351 Tinea unguium: Secondary | ICD-10-CM | POA: Insufficient documentation

## 2022-09-08 DIAGNOSIS — I10 Essential (primary) hypertension: Secondary | ICD-10-CM

## 2022-09-08 DIAGNOSIS — E871 Hypo-osmolality and hyponatremia: Secondary | ICD-10-CM

## 2022-09-08 DIAGNOSIS — F1010 Alcohol abuse, uncomplicated: Secondary | ICD-10-CM

## 2022-09-08 DIAGNOSIS — R21 Rash and other nonspecific skin eruption: Secondary | ICD-10-CM

## 2022-09-08 MED ORDER — CICLOPIROX OLAMINE 0.77 % EX CREA: TOPICAL_CREAM | Freq: Two times a day (BID) | CUTANEOUS | 0 refills | Status: DC

## 2022-09-08 MED ORDER — TRIAMCINOLONE ACETONIDE 0.1 % EX CREA: 1.0000 | TOPICAL_CREAM | Freq: Two times a day (BID) | CUTANEOUS | 0 refills | Status: DC

## 2022-09-08 NOTE — Patient Instructions (Signed)
Alcohol Misuse and Dependence Information, Adult Alcohol is a widely available drug and people choose to drink alcohol in different amounts. Alcohol misuse and dependence can have a negative effect on your life. Alcohol misuse is when you use alcohol too much or too often. You may have a hard time setting a limit on the amount you drink. Alcohol dependence is when you use alcohol consistently for a period of time, and your body changes as a result. Alcohol dependence can make it hard for you to stop drinking because you may start to feel sick or different when you do not drink alcohol. These symptoms are known as withdrawal. People who drink alcohol very often and in large amounts, may develop what is called an alcohol use disorder. How can alcohol misuse and dependence affect me? Drinking too much can lead to addiction. You may feel like you need alcohol to function normally. You may drink alcohol before work in the morning, during the day, or as soon as you get home from work in the evening. These actions can result in: Poor work performance. Job loss. Financial problems. Car crashes or criminal charges from driving after drinking alcohol. Problems in your relationships with friends and family. Losing the trust and respect of coworkers, friends, and family. Drinking heavily over a long period of time can permanently damage your body and brain, and can cause lifelong health issues, such as: Damage to your liver or pancreas. Heart problems, high blood pressure, or stroke. Certain cancers. Decreased ability to fight infections. Brain or nerve damage. Depression. Early death, also called premature death. If you are careless or you crave alcohol, it is easy to drink more than your body can handle (overdose). Alcohol overdose is a serious situation that requires hospitalization. It may lead to permanent injuries or death. What can increase my risk? Having a family history of alcohol misuse. Having  depression or other mental health conditions. Beginning to drink at an early age. Binge drinking often. Experiencing trauma, stress, and an unstable home life during childhood. Spending time with people who drink often. What actions can I take to prevent alcohol misuse and dependence? Do not drink alcohol if: Your health care provider tells you not to drink. You are pregnant, may be pregnant, or are planning to become pregnant. If you drink alcohol: Limit how much you have to: 0-1 drink a day for women who are not pregnant. 0-2 drinks a day for men. Know how much alcohol is in your drink. In the [place] one drink equals one 12 oz bottle of beer (355 mL), one 5 oz glass of wine (148 mL), or one 1 oz glass of hard liquor (44 mL). If you think you have an alcohol dependency problem, decide to stop drinking. This can be very hard to do if you are used to frequently drinking alcohol. If you begin to have withdrawal symptoms, talk with your health care provider or a person that you trust. These symptoms may include anxiety, shaky hands, headache, nausea, sweating, or not being able to sleep. Choose to drink nonalcoholic beverages in social gatherings and places where there may be alcohol. Activity Spend more time on activities that you enjoy that do not involve alcohol, like hobbies or exercise. Find healthy ways to cope with stress, such as meditation or spending time with people you care about. General information Talk to your family, coworkers, and friends about supporting you in your efforts to stop drinking. If they drink, ask them not to drink  around you. Spend more time with people who do not drink alcohol. If you think that you have an alcohol dependency problem: Tell friends or family about your concerns. Talk with your health care provider or another health professional about where to get help. Work with a Transport planner and a Regulatory affairs officer. Consider joining a support group  for people who struggle with alcohol misuse and dependence. Where to find support  Your health care provider. SMART Recovery: smartrecovery.org Local treatment centers or chemical dependency counselors. Local AA groups in your community: ShedSizes.ch Where to find more information Centers for Disease Control and Prevention: StoreMirror.com.cy [employer] on Alcohol Abuse and Alcoholism: LinkVoyage.dk Alcoholics Anonymous (AA): ShedSizes.ch Contact a health care provider if: You drank more or for longer than you intended on more than one occasion. You often drink to the point of vomiting or passing out. You have problems in your life due to drinking, but you continue to drink. You keep drinking even though you feel anxious, depressed, or have experienced memory loss. You have stopped doing the things you used to enjoy in order to drink. You have to drink more than you used to in order to get the effect you want. You experience anxiety, sweating, nausea, shakiness, and trouble sleeping when you try to stop drinking. Get help right away if: You have serious withdrawal symptoms, including: Confusion. Racing heart. High blood pressure. Fever. These symptoms may be an emergency. Get help right away. Call 911. Do not wait to see if the symptoms will go away. Do not drive yourself to the hospital. Also, get help right away if: You have thoughts about hurting yourself or others. Take one of these steps if you feel like you may hurt yourself or others, or have thoughts about taking your own life: Call 911. Call the [facility] at 450-750-0040 or 988. This is open 24 hours a day. Text the Crisis Text Line at 2200400975. Summary Alcohol misuse and dependence can have a negative effect on your life. Drinking too much or too often can lead to addiction. If you drink alcohol, limit how much you use. If you are having trouble keeping your drinking under control, find ways to change your  behavior. Hobbies, calming activities, exercise, or support groups can help. If you feel you need help with changing your drinking habits, talk with your health care provider, a good friend, or a therapist, or go to a support group. This information is not intended to replace advice given to you by your health care provider. Make sure you discuss any questions you have with your health care provider. Document Revised: 02/09/2022 Document Reviewed: 02/09/2022 Elsevier Patient Education  [place]. Edema  Edema is when you have too much fluid in your body or under your skin. Edema may make your legs, feet, and ankles swell. Swelling often happens in looser tissues, such as around your eyes. This is a common condition. It gets more common as you get older. There are many possible causes of edema. These include: Eating too much salt (sodium). Being on your feet or sitting for a long time. Certain medical conditions, such as: Pregnancy. Heart failure. Liver disease. Kidney disease. Cancer. Hot weather may make edema worse. Edema is usually painless. Your skin may look swollen or shiny. Follow these instructions at home: Medicines Take over-the-counter and prescription medicines only as told by your doctor. Your doctor may prescribe a medicine to help your body get rid of extra water (diuretic).  Take this medicine if you are told to take it. Eating and drinking Eat a low-salt (low-sodium) diet as told by your doctor. Sometimes, eating less salt may reduce swelling. Depending on the cause of your swelling, you may need to limit how much fluid you drink (fluid restriction). General instructions Raise the injured area above the level of your heart while you are sitting or lying down. Do not sit still or stand for a long time. Do not wear tight clothes. Do not wear garters on your upper legs. Exercise your legs. This can help the swelling go down. Wear compression stockings as told by  your doctor. It is important that these are the right size. These should be prescribed by your doctor to prevent possible injuries. If elastic bandages or wraps are recommended, use them as told by your doctor. Contact a doctor if: Treatment is not working. You have heart, liver, or kidney disease and have symptoms of edema. You have sudden and unexplained weight gain. Get help right away if: You have shortness of breath or chest pain. You cannot breathe when you lie down. You have pain, redness, or warmth in the swollen areas. You have heart, liver, or kidney disease and get edema all of a sudden. You have a fever and your symptoms get worse all of a sudden. These symptoms may be an emergency. Get help right away. Call 911. Do not wait to see if the symptoms will go away. Do not drive yourself to the hospital. Summary Edema is when you have too much fluid in your body or under your skin. Edema may make your legs, feet, and ankles swell. Swelling often happens in looser tissues, such as around your eyes. Raise the injured area above the level of your heart while you are sitting or lying down. Follow your doctor's instructions about diet and how much fluid you can drink. This information is not intended to replace advice given to you by your health care provider. Make sure you discuss any questions you have with your health care provider. Document Revised: 08/09/2021 Document Reviewed: 08/09/2021 Elsevier Patient Education  [place]  Athlete's foot (tinea pedis) is a fungal infection of the skin on your feet. It often occurs on the skin that is between or underneath your toes. It can also occur on the soles of your feet. Symptoms include itchy or white and flaky areas on the skin. The infection can spread from person to person (is contagious). It can also spread when a person's bare feet come in contact with the fungus on shower floors or on items such as  shoes. Follow these instructions at home: Medicines Apply or take over-the-counter and prescription medicines only as told by your doctor. Apply your antifungal medicine as told by your doctor. Do not stop using it even if your feet start to get better. Foot care Do not scratch your feet. Keep your feet dry: Wear cotton or wool socks. Change your socks every day or if they become wet. Wear shoes that allow air to move around, such as sandals or canvas tennis shoes. Wash and dry your feet: Every day or as told by your doctor. After exercising. Including the area between your toes. General instructions Do not share any of these items that touch your feet: Towels. Shoes. Nail clippers. Other personal items. Protect your feet by wearing sandals in wet areas, such as locker rooms and shared showers. Keep all follow-up visits. If you have diabetes, keep  your blood sugar under control. Contact a doctor if: You have a fever. You have swelling, pain, warmth, or redness in your foot. Your feet are not getting better with treatment. Your symptoms get worse. You have new symptoms. You have very bad pain. Summary Athlete's foot is a fungal infection of the skin on your feet. This condition is caused by a fungus that grows in warm, moist places. Symptoms include itchy or white and flaky areas on the skin. Apply your antifungal medicine as told by your doctor. Keep your feet clean and dry. This information is not intended to replace advice given to you by your health care provider. Make sure you discuss any questions you have with your health care provider. Document Revised: 03/28/2021 Document Reviewed: 03/28/2021 Elsevier Patient Education  [place].

## 2022-09-08 NOTE — Progress Notes (Signed)
 Established Patient Office Visit  Subjective   Patient ID: [name], female    DOB: 10/30/1976  Age: 46 y.o. MRN: [id number]  CC: Lower leg swelling with itchy rash. Ongoing Athlete's foot.    HPI  [name] is a 45 y/o female w history of alcohol abuse, anxiety, depression, and hypertension presents for routine follow up and lab review. Her sodium on 12/22/21  was 116 mmol/L  and she was advised to go to the ED, but did not. Since then, her sodium has been rising and was 131 mmol/L on 09/01/22. Her last liver function labs were done on 05/26/22, where her AST was 99 IU/L and her ALT was 39 IU/L. She continues to drink 2-3 alcoholic seltzers per day and is not interested in quitting or counseling. She does not have pruritis, right upper quadrant pain, jaundice, or easy bleeding/bruising. She endorses bilateral lower extremity edema that is accompanied with open scabs that have been "itchy." She has not been wearing her compression stockings. She has been using OTC hydrocortisone and neosporin with relief. Additionally, she has had ongoing onychomycosis to her left big toe and second toe for 7 months. She has been applying OTC antifungal cream and sprays for 7 months now with no improvement. Her blood pressure was elevated during visit at 168/101, she denies chest pain, palpitation, and shortness of breath. Overall, she is doing well and offers no other complaints.   Review of Systems  Constitutional: Negative.   HENT: Negative.    Eyes: Negative.   Respiratory: Negative.    Cardiovascular:  Positive for claudication (.intermittently) and leg swelling (.bilaterally).  Gastrointestinal:  Positive for heartburn.  Genitourinary: Negative.   Musculoskeletal: Negative.   Skin:  Positive for itching (.bilateral lower legs.) and rash (.scabs bilaterally to anterior tibia.).  Neurological: Negative.   Endo/Heme/Allergies: Negative.   Psychiatric/Behavioral: Negative.         Objective:     LMP 04/23/2019 (Approximate)  BP Readings from Last 3 Encounters:  09/08/22 (!) 168/101  06/02/22 (!) 149/49  03/02/22 119/80   Wt Readings from Last 3 Encounters:  09/08/22 146 lb 9.7 oz (66.5 kg)  06/02/22 146 lb (66.2 kg)  03/02/22 142 lb (64.4 kg)      Physical Exam Constitutional:      Appearance: She is normal weight.  HENT:     Head: Normocephalic.  Cardiovascular:     Rate and Rhythm: Normal rate and regular rhythm.     Pulses: Normal pulses.     Heart sounds: Normal heart sounds.  Pulmonary:     Effort: Pulmonary effort is normal.     Breath sounds: Normal breath sounds.  Skin:    General: Skin is warm and dry.     Comments: Bilateral scabs to tibia. Edematous lower legs bilaterally. Minimal erythema. No warmth.   Neurological:     Mental Status: She is alert.      No results found for any visits on 09/08/22.  Last CBC Lab Results  Component Value Date   WBC 4.8 05/05/2021   HGB 12.0 05/05/2021   HCT 34.0 05/05/2021   MCV 95 05/05/2021   MCH 33.4 (H) 05/05/2021   RDW 12.8 05/05/2021   PLT 167 05/05/2021   Last metabolic panel Lab Results  Component Value Date   GLUCOSE 112 (H) 09/01/2022   NA 131 (L) 09/01/2022   K 4.0 09/01/2022   CL 92 (L) 09/01/2022   CO2 24 09/01/2022   BUN   5 (L) 09/01/2022   CREATININE 0.61 09/01/2022   EGFR 110 09/01/2022   CALCIUM 9.2 09/01/2022   PHOS 3.2 01/10/2021   PROT 8.3 05/26/2022   ALBUMIN 4.1 05/26/2022   LABGLOB 4.2 05/26/2022   AGRATIO 1.0 (L) 05/26/2022   BILITOT 0.8 05/26/2022   ALKPHOS 126 (H) 05/26/2022   AST 99 (H) 05/26/2022   ALT 39 (H) 05/26/2022   ANIONGAP 8 01/19/2021   Last lipids Lab Results  Component Value Date   CHOL 174 07/28/2021   HDL 53 07/28/2021   LDLCALC 104 (H) 07/28/2021   TRIG 93 07/28/2021   CHOLHDL 3.3 07/28/2021   Last hemoglobin A1c Lab Results  Component Value Date   HGBA1C 5.4 02/05/2020      The 10-year ASCVD risk score (Arnett DK, et  al., 2019) is: 4.9%    Assessment & Plan:   1. Onychomycosis - Begin applying Loprox cream to toes. She was educated on medication side effects and advised to notify clinic. Keep feet clean and dry.  - ciclopirox (LOPROX) 0.77 % cream; Apply topically 2 (two) times daily.  Dispense: 15 g; Refill: 0  2. Alcohol abuse - Patient continues to drink 2-3 alcoholic seltzers per day. She is not interested in counseling at this time. Educated on alcohol side effects.   3. Rash - Apply triamcinolone cream to lower legs where scabs are present.  - triamcinolone cream (KENALOG) 0.1 %; Apply 1 Application topically 2 (two) times daily.  Dispense: 30 g; Refill: 0  4. Bilateral lower extremity edema - Continue to wear compression stockings and elevate legs after activity. She was advised to go to the ED for worsening symptoms.  5. Hyponatremia - Sodium is improving at 131 mmol/L. Cut back on alcohol consumption. Monitor for signs of hyponatremia. She was encouraged on alcohol abstinence.  6. Essential hypertension - Blood pressure elevated today in clinic. Continue to take propranolol daily. Can start taking in the afternoon rather than the morning. Continue DASH diet and exercise as tolerated.   Follow-up in 2 months.   Lauren Perkins  

## 2022-10-06 ENCOUNTER — Ambulatory Visit: Payer: Self-pay

## 2022-10-17 ENCOUNTER — Encounter (INDEPENDENT_AMBULATORY_CARE_PROVIDER_SITE_OTHER): Payer: Self-pay

## 2022-10-27 ENCOUNTER — Ambulatory Visit: Payer: Self-pay

## 2022-11-01 ENCOUNTER — Ambulatory Visit: Payer: Self-pay | Admitting: Gerontology

## 2022-11-08 ENCOUNTER — Ambulatory Visit: Payer: Self-pay | Admitting: Gerontology

## 2022-11-16 ENCOUNTER — Ambulatory Visit: Payer: Self-pay | Admitting: Gerontology

## 2022-12-02 ENCOUNTER — Emergency Department
Admission: EM | Admit: 2022-12-02 | Discharge: 2022-12-03 | Disposition: A | Discharge status: Home / Self Care | Payer: Medicaid Other | Attending: Emergency Medicine | Admitting: Emergency Medicine

## 2022-12-02 DIAGNOSIS — Z79899 Other long term (current) drug therapy: Secondary | ICD-10-CM | POA: Insufficient documentation

## 2022-12-02 DIAGNOSIS — I10 Essential (primary) hypertension: Secondary | ICD-10-CM | POA: Diagnosis not present

## 2022-12-02 DIAGNOSIS — E876 Hypokalemia: Secondary | ICD-10-CM | POA: Diagnosis not present

## 2022-12-02 DIAGNOSIS — L03116 Cellulitis of left lower limb: Secondary | ICD-10-CM | POA: Diagnosis not present

## 2022-12-02 DIAGNOSIS — Z8616 Personal history of COVID-19: Secondary | ICD-10-CM | POA: Insufficient documentation

## 2022-12-02 LAB — CBC WITH DIFFERENTIAL/PLATELET
Abs Immature Granulocytes: 0.07 10*3/uL (ref 0.00–0.07)
Basophils Absolute: 0.1 10*3/uL (ref 0.0–0.1)
Basophils Relative: 1 %
Eosinophils Absolute: 0.2 10*3/uL (ref 0.0–0.5)
Eosinophils Relative: 2 %
HCT: 34.3 % — ABNORMAL LOW (ref 36.0–46.0)
Hemoglobin: 11.6 g/dL — ABNORMAL LOW (ref 12.0–15.0)
Immature Granulocytes: 1 %
Lymphocytes Relative: 18 %
Lymphs Abs: 2.4 10*3/uL (ref 0.7–4.0)
MCH: 31.8 pg (ref 26.0–34.0)
MCHC: 33.8 g/dL (ref 30.0–36.0)
MCV: 94 fL (ref 80.0–100.0)
Monocytes Absolute: 1.5 10*3/uL — ABNORMAL HIGH (ref 0.1–1.0)
Monocytes Relative: 12 %
Neutro Abs: 8.9 10*3/uL — ABNORMAL HIGH (ref 1.7–7.7)
Neutrophils Relative %: 66 %
Platelets: 318 10*3/uL (ref 150–400)
RBC: 3.65 MIL/uL — ABNORMAL LOW (ref 3.87–5.11)
RDW: 12.6 % (ref 11.5–15.5)
WBC: 13.2 10*3/uL — ABNORMAL HIGH (ref 4.0–10.5)
nRBC: 0 % (ref 0.0–0.2)

## 2022-12-02 LAB — COMPREHENSIVE METABOLIC PANEL
ALT: 20 U/L (ref 0–44)
AST: 56 U/L — ABNORMAL HIGH (ref 15–41)
Albumin: 2.9 g/dL — ABNORMAL LOW (ref 3.5–5.0)
Alkaline Phosphatase: 98 U/L (ref 38–126)
Anion gap: 12 (ref 5–15)
BUN: <5 mg/dL — ABNORMAL LOW (ref 6–20)
CO2: 26 mmol/L (ref 22–32)
Calcium: 8 mg/dL — ABNORMAL LOW (ref 8.9–10.3)
Chloride: 91 mmol/L — ABNORMAL LOW (ref 98–111)
Creatinine, Ser: 0.42 mg/dL — ABNORMAL LOW (ref 0.44–1.00)
GFR, Estimated: >60 mL/min (ref >60)
Glucose, Bld: 97 mg/dL (ref 70–99)
Potassium: 3.1 mmol/L — ABNORMAL LOW (ref 3.5–5.1)
Sodium: 129 mmol/L — ABNORMAL LOW (ref 135–145)
Total Bilirubin: 0.9 mg/dL (ref 0.3–1.2)
Total Protein: 8.1 g/dL (ref 6.5–8.1)

## 2022-12-02 LAB — LACTIC ACID, PLASMA
Lactic Acid, Venous: 1.8 mmol/L (ref 0.5–1.9)
Lactic Acid, Venous: 1.7 mmol/L (ref 0.5–1.9)

## 2022-12-02 LAB — PROTIME-INR
INR: 1.2 (ref 0.8–1.2)
Prothrombin Time: 15.5 seconds — ABNORMAL HIGH (ref 11.4–15.2)

## 2022-12-02 MED ORDER — OXYCODONE HCL 5 MG PO TABS: 5.0000 mg | ORAL_TABLET | ORAL | 0 refills | Status: DC | PRN

## 2022-12-02 MED ORDER — BACITRACIN ZINC 500 UNIT/GM EX OINT
TOPICAL_OINTMENT | Freq: Once | CUTANEOUS | Status: AC
  Filled 2022-12-02: qty 5.4

## 2022-12-02 MED ORDER — SODIUM CHLORIDE 0.9 % IV SOLN
100.0000 mg | Freq: Once | INTRAVENOUS | Status: AC
  Administered 2022-12-03: 100 mg via INTRAVENOUS
  Filled 2022-12-02: qty 100

## 2022-12-02 MED ORDER — DOXYCYCLINE HYCLATE 50 MG PO CAPS: 100.0000 mg | ORAL_CAPSULE | Freq: Two times a day (BID) | ORAL | 0 refills | Status: AC

## 2022-12-02 MED ORDER — KETOROLAC TROMETHAMINE 30 MG/ML IJ SOLN
15.0000 mg | Freq: Once | INTRAMUSCULAR | Status: AC
  Administered 2022-12-02: 15 mg via INTRAVENOUS
  Filled 2022-12-02: qty 1

## 2022-12-02 MED ORDER — OXYCODONE HCL 5 MG PO TABS
5.0000 mg | ORAL_TABLET | Freq: Once | ORAL | Status: AC
  Administered 2022-12-02: 5 mg via ORAL
  Filled 2022-12-02: qty 1

## 2022-12-02 MED ORDER — IBUPROFEN 600 MG PO TABS: 600.0000 mg | ORAL_TABLET | Freq: Three times a day (TID) | ORAL | 0 refills | Status: DC | PRN

## 2022-12-02 MED ORDER — CEPHALEXIN 500 MG PO CAPS: 500.0000 mg | ORAL_CAPSULE | Freq: Three times a day (TID) | ORAL | 0 refills | Status: AC

## 2022-12-02 MED ORDER — SODIUM CHLORIDE 0.9 % IV SOLN
1.0000 g | Freq: Once | INTRAVENOUS | Status: AC
  Administered 2022-12-02: 1 g via INTRAVENOUS
  Filled 2022-12-02: qty 10

## 2022-12-02 MED ORDER — POTASSIUM CHLORIDE CRYS ER 20 MEQ PO TBCR
40.0000 meq | EXTENDED_RELEASE_TABLET | Freq: Once | ORAL | Status: AC
  Administered 2022-12-03: 40 meq via ORAL
  Filled 2022-12-02: qty 2

## 2022-12-02 NOTE — ED Notes (Signed)
Pt refused for this RN to wrap her left leg d/t infection exposure while in [place], pt reports does not want leg to be touched, explained WR full of germs and can increase her risk of infection to that leg, pt verbalized understanding, but still refused dressing to her leg.

## 2022-12-02 NOTE — ED Notes (Signed)
pt has large reddened and open are of skin to left lower leg. pt states she had a "bite" on her leg 3 weeks ago but 1.5 weeks ago her leg began to look really bad. states she went to a free clinic and was seen by a provider who said it just looked like a "skin issue" and gave her a cortisone cream to put on it and it did not help.

## 2022-12-02 NOTE — ED Triage Notes (Signed)
Pt here for left leg infection, reports was seen at [facility] 3-4 weeks ago for "spots on leg" given Cortizone cream, pt reports then her left leg started to turn red and has progressively gotten worse. Left ankle red, swollen, oozing, bleeding, and skin dry with flaking/cracking. Infection appears to be moving up her left leg. Denies fevers. Pt reports very painful and difficulty applying weight to her left leg.

## 2022-12-02 NOTE — Discharge Instructions (Addendum)
1.  Take antibiotics as prescribed: Doxycycline '100mg'$  twice daily x 10 days Keflex '500mg'$  3 times daily x 10 days 2.  You may take pain medicines as needed (Motrin/Oxycodone). 3.  Return to the ER for wound check Sunday morning. 4.  Use crutches as needed. 5.  Return to the ER sooner for worsening symptoms, persistent vomiting, fever/chills or other concerns.

## 2022-12-02 NOTE — ED Provider Notes (Signed)
[facility] Provider Note    Event Date/Time   First MD Initiated Contact with Patient 12/02/22 2310     (approximate)   History   Leg Infection   HPI  [name] is a 46 y.o. female who presents to the ED from home with a chief complaint of left lower leg infection.  [facility] for spots on her leg for which she was given cortisone cream.  Patient states her left leg began to progressively turn red, blister and peel.  Denies preceding incident such as trauma, injury or bug bite.  Denies fevers, chills, chest pain, shortness of breath, abdominal pain, nausea, vomiting or dizziness.     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   Alcohol abuse    Anxiety    Bacterial infection    Cervical intraepithelial neoplasia III    CIN III (cervical intraepithelial neoplasia grade III) with severe dysplasia 12/19/2005   Condyloma 12/19/2009   H/O fatigue 12/20/2007   H/O varicella    Headache(784.0)    HPV (human papilloma virus) anogenital infection    Hx of dizziness 09/20/2011   Hx: UTI (urinary tract infection)    Back pain   Hypertension    Irregular menstrual cycle    IV drug user    Palpitations 12/19/2008   Pelvic pain in female 12/05/2011   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    Age 41   Trichomonas    Yeast infection      Active Problem List   Patient Active Problem List   Diagnosis Date Noted   Onychomycosis 09/08/2022   Hyponatremia 12/29/2021   Sinusitis, chronic 08/04/2021   Nasal congestion 07/01/2021   Pain in the abdomen 06/02/2021   Bilateral lower extremity edema 06/02/2021   Hot flashes 04/22/2021   History of HPV infection 04/22/2021   Rash 03/24/2021   Bilateral lower extremity edema 03/03/2021   Hospital discharge follow-up 02/04/2021   Stasis dermatitis of  both legs 02/04/2021   Empyema (HCC) 01/09/2021   Anemia 01/09/2021   COVID-19 virus infection 01/09/2021   Acute hypoxemic respiratory failure due to COVID-19 (HCC) 01/04/2021   Edema of right lower leg 05/28/2020   Abnormal laboratory test result 02/14/2020   History of allergy 01/16/2020   Essential hypertension 01/16/2020   Smoking 01/16/2020   Health care maintenance 01/08/2020   Alcohol abuse    Alcohol withdrawal delirium (HCC) 07/17/2015   Ascites 07/21/2014   Alcohol withdrawal (HCC) 07/09/2014   Alcoholism /alcohol abuse (HCC) 07/09/2014   Abnormal computed tomography angiography (CTA) of abdomen and pelvis 07/09/2014   Malnutrition of moderate degree (HCC) 07/09/2014   Liver function study, abnormal 04/08/2014   ETOH abuse 04/08/2014   Palpitations - evaluated by Dr. Melburn Popper in the past 02/20/2014   Anxiety 04/02/2012   Drug dependence (HCC) 03/18/2012   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     Past Surgical History   Past Surgical History:  Procedure Laterality Date   A*wisdom teeth ext     CERVICAL CONIZATION W/BX  11/30/2011   Procedure: CONIZATION CERVIX WITH BIOPSY;  Surgeon: Janine Limbo, MD;  Location: [facility];  Service: Gynecology;  Laterality: N/A;   CERVICAL CONIZATION W/BX  03/16/2012   Procedure: CONIZATION CERVIX WITH BIOPSY;  Surgeon: Merton Border  Stefano Gaul, MD;  Location: [facility];  Service: Gynecology;  Laterality: N/A;   CONIZATION CERVIX     x 3   DECORTICATION Right 01/14/2021   Procedure: DECORTICATION;  Surgeon: Loreli Slot, MD;  Location: [facility] OR;  Service: Thoracic;  Laterality: Right;   DILATION AND CURETTAGE OF UTERUS  11/30/2011   Procedure: DILATATION AND CURETTAGE;  Surgeon: Janine Limbo, MD;  Location: [facility];  Service: Gynecology;  Laterality: N/A;   svd     x 1   VIDEO ASSISTED THORACOSCOPY (VATS)/EMPYEMA Right 01/14/2021   Procedure: VIDEO ASSISTED THORACOSCOPY (VATS)/DRAIN EMPYEMA;  Surgeon: Loreli Slot,  MD;  Location: [facility] OR;  Service: Thoracic;  Laterality: Right;     Home Medications   Prior to Admission medications   Medication Sig Start Date End Date Taking? Authorizing Provider  cephALEXin (KEFLEX) 500 MG capsule Take 1 capsule (500 mg total) by mouth 3 (three) times daily for 10 days. 12/02/22 12/12/22 Yes Irean Hong, MD  doxycycline (VIBRAMYCIN) 50 MG capsule Take 2 capsules (100 mg total) by mouth 2 (two) times daily for 10 days. 12/02/22 12/12/22 Yes Irean Hong, MD  ibuprofen (ADVIL) 600 MG tablet Take 1 tablet (600 mg total) by mouth every 8 (eight) hours as needed. 12/02/22  Yes Irean Hong, MD  Omega-3 Fatty Acids (FISH OIL) 1200 MG CAPS Take 1,200 mg by mouth.   Yes [provider]  oxyCODONE (ROXICODONE) 5 MG immediate release tablet Take 1 tablet (5 mg total) by mouth every 4 (four) hours as needed for severe pain. 12/02/22  Yes Irean Hong, MD  propranolol (INDERAL) 40 MG tablet TAKE 1 TABLET(40 MG) BY MOUTH DAILY 09/06/22  Yes Iloabachie, [name] E, NP  Biotin 10 MG CAPS Take 10 mg by mouth. Patient not taking: Reported on 12/02/2022    [provider]  cetirizine (ZYRTEC ALLERGY) 10 MG tablet Take 1 tablet (10 mg total) by mouth daily. Patient not taking: Reported on 06/02/2022 03/02/22   Iloabachie, [name] E, NP  ciclopirox (LOPROX) 0.77 % cream Apply topically 2 (two) times daily. Patient not taking: Reported on 12/02/2022 09/08/22   Iloabachie, [name] E, NP  triamcinolone cream (KENALOG) 0.1 % Apply 1 Application topically 2 (two) times daily. Patient not taking: Reported on 12/02/2022 09/08/22   Rolm Gala, NP     Allergies  Acetaminophen   Family History   Family History  Problem Relation Age of Onset   Cervical cancer Mother    Breast cancer Mother    Cancer Mother 24       Breast & cervical    Thyroid disease Mother    Stroke Maternal Grandmother    COPD Maternal Grandfather        Emphysema   Hypertension Paternal  Grandfather    Stroke Paternal Grandfather    Heart attack Paternal Grandfather    Cancer Father 41       prostate   Hyperlipidemia Father    Other Paternal Grandmother        "old age" mid 68s     Physical Exam  Triage Vital Signs: ED Triage Vitals  Enc Vitals Group     BP 12/02/22 2057 (!) 152/96     Pulse Rate 12/02/22 2057 86     Resp 12/02/22 2057 17     Temp 12/02/22 2057 98.1 F (36.7 C)     Temp Source 12/02/22 2057 Oral     SpO2 12/02/22 2057 96 %  Weight 12/02/22 2059 140 lb (63.5 kg)     Height 12/02/22 2059 5' (1.524 m)     Head Circumference --      Peak Flow --      Pain Score 12/02/22 2059 9     Pain Loc --      Pain Edu? --      Excl. in GC? --     Updated Vital Signs: BP 123/64   Pulse 77   Temp 98.1 F (36.7 C) (Oral)   Resp 13   Ht 5' (1.524 m)   Wt 63.5 kg   LMP 04/23/2019 (Approximate)   SpO2 97%   BMI 27.34 kg/m    General: Awake, no distress.  CV:  RRR.  Good peripheral perfusion.  Resp:  Normal effort.  CTAB. Abd:  Nontender.  No distention.  Other:  LLE: Warmth and erythema to lower leg below the knee without streaking.  Areas of blistering and skin peeling.  Supple calf.  Palpable distal pulses.  Brisk, less than 5-second capillary refill.  Patchy eczematous areas to bilateral hands.  No petechiae.  No oral mucosal involvement.   ED Results / Procedures / Treatments  Labs (all labs ordered are listed, but only abnormal results are displayed) Labs Reviewed  COMPREHENSIVE METABOLIC PANEL - Abnormal; Notable for the following components:      Result Value   Sodium 129 (*)    Potassium 3.1 (*)    Chloride 91 (*)    BUN <5 (*)    Creatinine, Ser 0.42 (*)    Calcium 8.0 (*)    Albumin 2.9 (*)    AST 56 (*)    All other components within normal limits  CBC WITH DIFFERENTIAL/PLATELET - Abnormal; Notable for the following components:   WBC 13.2 (*)    RBC 3.65 (*)    Hemoglobin 11.6 (*)    HCT 34.3 (*)    Neutro Abs 8.9 (*)     Monocytes Absolute 1.5 (*)    All other components within normal limits  PROTIME-INR - Abnormal; Notable for the following components:   Prothrombin Time 15.5 (*)    All other components within normal limits  CULTURE, BLOOD (ROUTINE X 2)  CULTURE, BLOOD (ROUTINE X 2)  LACTIC ACID, PLASMA  LACTIC ACID, PLASMA     EKG  None   RADIOLOGY None   Official radiology report(s): No results found.   PROCEDURES:  Critical Care performed: No  Procedures   MEDICATIONS ORDERED IN ED: Medications  doxycycline (VIBRAMYCIN) 100 mg in sodium chloride 0.9 % 250 mL IVPB (has no administration in time range)  cefTRIAXone (ROCEPHIN) 1 g in sodium chloride 0.9 % 100 mL IVPB (1 g Intravenous New Bag/Given 12/02/22 2341)  bacitracin ointment (has no administration in time range)  potassium chloride SA (KLOR-CON M) CR tablet 40 mEq (has no administration in time range)  ketorolac (TORADOL) 30 MG/ML injection 15 mg (15 mg Intravenous Given 12/02/22 2338)  oxyCODONE (Oxy IR/ROXICODONE) immediate release tablet 5 mg (5 mg Oral Given 12/02/22 2337)     IMPRESSION / MDM / ASSESSMENT AND PLAN / ED COURSE  I reviewed the triage vital signs and the nursing notes.                             46 year old female presenting with redness and swelling to left lower leg.  Differential diagnosis includes but is not limited to cellulitis, abscess, rhabdomyolysis,  DVT, etc.  I have personally reviewed patient's records and note a PCP office visit on 09/08/2022 for alcohol abuse and hyponatremia.  Patient's presentation is most consistent with acute presentation with potential threat to life or bodily function.  Laboratory results demonstrate moderate leukocytosis WBC 13.2, mild hypokalemia potassium 3.1, mild hyponatremia with history of same.  Lactic acid is unremarkable less than 2.  Blood cultures are pending.  Discussed with patient hospitalization versus trial on oral antibiotics.  Patient prefers  trial on oral antibiotics.  Will administer IV Rocephin and IV doxycycline now and discharged home on both.  Will also discharge home on as needed oxycodone and Motrin for pain.  Patient will return to the ED for wound check on Sunday.  Return precautions given.  Patient verbalizes understanding and agrees with plan of care.      FINAL CLINICAL IMPRESSION(S) / ED DIAGNOSES   Final diagnoses:  Cellulitis of left lower extremity  Hypokalemia     Rx / DC Orders   ED Discharge Orders          Ordered    cephALEXin (KEFLEX) 500 MG capsule  3 times daily        12/02/22 2332    doxycycline (VIBRAMYCIN) 50 MG capsule  2 times daily        12/02/22 2332    ibuprofen (ADVIL) 600 MG tablet  Every 8 hours PRN        12/02/22 2332    oxyCODONE (ROXICODONE) 5 MG immediate release tablet  Every 4 hours PRN        12 /15/23 2332             Note:  This document was prepared using Dragon voice recognition software and may include unintentional dictation errors.   Irean Hong, MD 12/03/22 [phone]

## 2022-12-03 ENCOUNTER — Other Ambulatory Visit: Payer: Self-pay | Admitting: Gerontology

## 2022-12-03 DIAGNOSIS — R002 Palpitations: Secondary | ICD-10-CM

## 2022-12-06 ENCOUNTER — Other Ambulatory Visit: Payer: Self-pay | Admitting: Gerontology

## 2022-12-06 DIAGNOSIS — R002 Palpitations: Secondary | ICD-10-CM

## 2022-12-06 NOTE — Telephone Encounter (Signed)
Patient must schedule OV prior to additional refills. 

## 2022-12-06 NOTE — Telephone Encounter (Signed)
Already sent in refill for #30. Patient needs OV before giving 90 day supply.

## 2022-12-07 LAB — CULTURE, BLOOD (ROUTINE X 2)
Culture: NO GROWTH
Culture: NO GROWTH
Special Requests: ADEQUATE
Special Requests: ADEQUATE

## 2022-12-20 ENCOUNTER — Other Ambulatory Visit: Payer: Self-pay | Admitting: Gerontology

## 2022-12-20 ENCOUNTER — Ambulatory Visit: Payer: Self-pay | Admitting: Gerontology

## 2022-12-20 ENCOUNTER — Encounter: Payer: Self-pay | Admitting: Gerontology

## 2022-12-20 ENCOUNTER — Other Ambulatory Visit: Payer: Self-pay

## 2022-12-20 VITALS — BP 160/90 | HR 85 | Temp 98.1°F | Resp 16 | Ht 60.0 in | Wt 142.4 lb

## 2022-12-20 DIAGNOSIS — I10 Essential (primary) hypertension: Secondary | ICD-10-CM

## 2022-12-20 DIAGNOSIS — E871 Hypo-osmolality and hyponatremia: Secondary | ICD-10-CM

## 2022-12-20 DIAGNOSIS — L24A9 Irritant contact dermatitis due friction or contact with other specified body fluids: Secondary | ICD-10-CM

## 2022-12-20 DIAGNOSIS — E876 Hypokalemia: Secondary | ICD-10-CM | POA: Insufficient documentation

## 2022-12-20 DIAGNOSIS — R002 Palpitations: Secondary | ICD-10-CM

## 2022-12-20 MED ORDER — PROPRANOLOL HCL 40 MG PO TABS: ORAL_TABLET | ORAL | 0 refills | Status: DC

## 2022-12-20 MED ORDER — SULFAMETHOXAZOLE-TRIMETHOPRIM 800-160 MG PO TABS: 1.0000 | ORAL_TABLET | Freq: Two times a day (BID) | ORAL | 0 refills | Status: DC

## 2022-12-20 NOTE — Progress Notes (Signed)
Established Patient Office Visit  Subjective   Patient ID: [name], female    DOB: 1976-10-07  Age: 47 y.o. MRN: [id number]  Chief Complaint  Patient presents with   Follow-up   Cellulitis    Left lower leg. Seen at [facility] ED on 12/02/22 and started on antibiotics   Medication Refill    Patient requesting refill on Propanolol    HPI  [name] is a 46y/o female w history of alcohol abuse, anxiety, depression, and hypertension presents for routine follow up visit and medication refill. Her blood pressure is elevated during visit at 160/90, states that she's took her propranolol. She was seen at [facility] Emergency room on 12/02/22 for cellulitis of left lower leg and was treated with Cephalexin 500 mg for 10 day and Doxycycline 100 mg bid for 10 days. She states that she didn't take Doxycycline because of abdominal pain/nausea. Currently, she states that left lower leg is still painful, 8/10 sharp  pain and it is constant and taking 200 mg Ibuprofen minimally relieve symptoms. She has dried eschar covered sites to the ventral surface  area of her left lower leg,  a dime sized open wound with yellowish slough and purulent drainage , to her left ankle. Area surrounding the open wound is swollen, painful to touch, but no erythema. Her Sodium was 129 mmol/L, potassium was 3.1 mmol/L and AST was 56 U/L. She states that she continues to drink alcoholoc beverages every other day. She denies fever and chills, and offers no further complaint.    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   Alcohol abuse    Anxiety    Bacterial infection    Cervical intraepithelial neoplasia III    CIN III (cervical intraepithelial neoplasia grade III) with severe dysplasia 12/19/2005   Condyloma 12/19/2009   H/O fatigue 12/20/2007   H/O varicella    Headache(784.0)    HPV (human papilloma virus) anogenital  infection    Hx of dizziness 09/20/2011   Hx: UTI (urinary tract infection)    Back pain   Hypertension    Irregular menstrual cycle    IV drug user    Palpitations 12/19/2008   Pelvic pain in female 12/05/2011   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    Age 54   Trichomonas    Yeast infection    Past Surgical History:  Procedure Laterality Date   A*wisdom teeth ext     CERVICAL CONIZATION W/BX  11/30/2011   Procedure: CONIZATION CERVIX WITH BIOPSY;  Surgeon: [name], MD;  Location: [place] [facility];  Service: Gynecology;  Laterality: N/A;   CERVICAL CONIZATION W/BX  03/16/2012   Procedure: CONIZATION CERVIX WITH BIOPSY;  Surgeon: Ena Dawley, MD;  Location: [place] [facility];  Service: Gynecology;  Laterality: N/A;   CONIZATION CERVIX     x 3   DECORTICATION Right 01/14/2021   Procedure: DECORTICATION;  Surgeon: Melrose Nakayama, MD;  Location: [place];  Service: Thoracic;  Laterality: Right;   DILATION AND CURETTAGE OF UTERUS  11/30/2011   Procedure: DILATATION AND CURETTAGE;  Surgeon: [name], MD;  Location: [place] [facility];  Service: Gynecology;  Laterality: N/A;   svd     x 1   VIDEO ASSISTED THORACOSCOPY (VATS)/EMPYEMA Right 01/14/2021   Procedure: VIDEO ASSISTED THORACOSCOPY (VATS)/DRAIN EMPYEMA;  Surgeon: Melrose Nakayama, MD;  Location: [place];  Service: Thoracic;  Laterality: Right;   Social History   Tobacco Use   Smoking status: Every Day    Packs/day: 0.25    Years: 30.00    Total pack years: 7.50    Types: Cigarettes   Smokeless tobacco: Never   Tobacco comments:    Has cut down to 2-3 cigarettes per day  Vaping Use   Vaping Use: Never used  Substance Use Topics   Alcohol use: Yes    Alcohol/week: 10.0 standard drinks of alcohol    Types: 10 Standard drinks or equivalent per week    Comment: drinks ~qod (hard seltzer)   Drug use: Not Currently    Types: Heroin    Comment: Abused rx drugs - on methadone 08/2011, stopped methadone  2007   Family History  Problem Relation  Age of Onset   Cervical cancer Mother    Breast cancer Mother    Cancer Mother 75       Breast & cervical    Thyroid disease Mother    Stroke Maternal Grandmother    COPD Maternal Grandfather        Emphysema   Hypertension Paternal Grandfather    Stroke Paternal Grandfather    Heart attack Paternal Grandfather    Cancer Father 74       prostate   Hyperlipidemia Father    Other Paternal 90        "old age" mid 75s   Allergies  Allergen Reactions   Acetaminophen Other (See Comments)    Intolerance, hx of tylenol OD      Review of Systems  Constitutional: Negative.   Respiratory: Negative.    Cardiovascular: Negative.   Skin:        Open wound to left ankle, and eschar to left lower leg  Neurological: Negative.   Psychiatric/Behavioral: Negative.        Objective:     BP (!) 160/90 (BP Location: Right Arm, Patient Position: Sitting, Cuff Size: Normal)   Pulse 85   Temp 98.1 F (36.7 C) (Oral)   Resp 16   Ht 5' (1.524 m)   Wt 142 lb 6.4 oz (64.6 kg)   LMP 04/23/2019 (Approximate)   SpO2 98%   BMI 27.81 kg/m  BP Readings from Last 3 Encounters:  12/20/22 (!) 160/90  12/03/22 120/60  09/08/22 (!) 168/101   Wt Readings from Last 3 Encounters:  12/20/22 142 lb 6.4 oz (64.6 kg)  12/02/22 140 lb (63.5 kg)  09/08/22 146 lb 9.7 oz (66.5 kg)      Physical Exam HENT:     Head: Normocephalic and atraumatic.     Mouth/Throat:     Mouth: Mucous membranes are moist.  Eyes:     Pupils: Pupils are equal, round, and reactive to light.  Cardiovascular:     Rate and Rhythm: Normal rate and regular rhythm.     Pulses: Normal pulses.     Heart sounds: Normal heart sounds.  Pulmonary:     Effort: Pulmonary effort is normal.     Breath sounds: Normal breath sounds.  Skin:    General: Skin is warm.     Findings: No erythema.     Comments: Dry eschar to ventral surface of left lower leg, open wound to left ankle, with purulent drainage, swollen , pain with  touch, and no erythema.  Neurological:     General: No focal deficit present.     Mental Status: She is alert and oriented to person, place, and time.  Mental status is at baseline.  Psychiatric:        Mood and Affect: Mood normal.        Behavior: Behavior normal.        Thought Content: Thought content normal.        Judgment: Judgment normal.      No results found for any visits on 12/20/22.  Last CBC Lab Results  Component Value Date   WBC 13.2 (H) 12/02/2022   HGB 11.6 (L) 12/02/2022   HCT 34.3 (L) 12/02/2022   MCV 94.0 12/02/2022   MCH 31.8 12/02/2022   RDW 12.6 12/02/2022   PLT 318 19/14/7829   Last metabolic panel Lab Results  Component Value Date   GLUCOSE 97 12/02/2022   NA 129 (L) 12/02/2022   K 3.1 (L) 12/02/2022   CL 91 (L) 12/02/2022   CO2 26 12/02/2022   BUN <5 (L) 12/02/2022   CREATININE 0.42 (L) 12/02/2022   GFRNONAA >60 12/02/2022   CALCIUM 8.0 (L) 12/02/2022   PHOS 3.2 01/10/2021   PROT 8.1 12/02/2022   ALBUMIN 2.9 (L) 12/02/2022   LABGLOB 4.2 05/26/2022   AGRATIO 1.0 (L) 05/26/2022   BILITOT 0.9 12/02/2022   ALKPHOS 98 12/02/2022   AST 56 (H) 12/02/2022   ALT 20 12/02/2022   ANIONGAP 12 12/02/2022   Last lipids Lab Results  Component Value Date   CHOL 174 07/28/2021   HDL 53 07/28/2021   LDLCALC 104 (H) 07/28/2021   TRIG 93 07/28/2021   CHOLHDL 3.3 07/28/2021   Last hemoglobin A1c Lab Results  Component Value Date   HGBA1C 5.4 02/05/2020   Last thyroid functions Lab Results  Component Value Date   TSH 1.740 12/22/2021      The 10-year ASCVD risk score (Arnett DK, et al., 2019) is: 5.7%    Assessment & Plan:   1. Palpitations - She will continue current medication, DASH diet. - propranolol (INDERAL) 40 MG tablet; Take one tablet of 40 mg daily  Dispense: 30 tablet; Refill: 0  2. Wound drainage - Unknown etiology of wound, possible MRSA, since she didn't take Doxycycline, was started on Bactrim, educated on medication  side effects and advised to notify clinic. She was advised to go to the ED for worsening symptoms. She was provided with gauze, kerlix and tape, advised to change dressing bid. - sulfamethoxazole-trimethoprim (BACTRIM DS) 800-160 MG tablet; Take 1 tablet by mouth 2 (two) times daily.  Dispense: 14 tablet; Refill: 0 - CBC w/Diff; Future - CBC w/Diff  3. Hyponatremia - She was strongly encouraged on alcohol abstinence. Will recheck Chemistry. - Comp Met (CMET); Future - Comp Met (CMET)  4. Essential hypertension - Her blood pressure was elevated, possibly due to pain, advised to continue medication, check blood pressure, record and bring log to follow up appointment, continue on DASH diet.  5. Hypokalemia - She completed Potasium tablets that was prescribed during her ED visit, will recheck lab. - Comp Met (CMET); Future - Comp Met (CMET)    Return in about 3 weeks (around 01/10/2023), or if symptoms worsen or fail to improve.    Aleya Durnell Jerold Coombe, NP

## 2022-12-20 NOTE — Patient Instructions (Signed)

## 2022-12-21 LAB — CBC WITH DIFFERENTIAL/PLATELET
Basophils Absolute: 0 10*3/uL (ref 0.0–0.2)
Basos: 0 %
EOS (ABSOLUTE): 0.1 10*3/uL (ref 0.0–0.4)
Eos: 1 %
Hematocrit: 33.3 % — ABNORMAL LOW (ref 34.0–46.6)
Hemoglobin: 11.5 g/dL (ref 11.1–15.9)
Immature Grans (Abs): 0 10*3/uL (ref 0.0–0.1)
Immature Granulocytes: 0 %
Lymphocytes Absolute: 2.8 10*3/uL (ref 0.7–3.1)
Lymphs: 33 %
MCH: 32.1 pg (ref 26.6–33.0)
MCHC: 34.5 g/dL (ref 31.5–35.7)
MCV: 93 fL (ref 79–97)
Monocytes Absolute: 1 10*3/uL — ABNORMAL HIGH (ref 0.1–0.9)
Monocytes: 11 %
Neutrophils Absolute: 4.6 10*3/uL (ref 1.4–7.0)
Neutrophils: 55 %
Platelets: 213 10*3/uL (ref 150–450)
RBC: 3.58 x10E6/uL — ABNORMAL LOW (ref 3.77–5.28)
RDW: 12.9 % (ref 11.7–15.4)
WBC: 8.5 10*3/uL (ref 3.4–10.8)

## 2022-12-21 LAB — COMPREHENSIVE METABOLIC PANEL
ALT: 35 IU/L — ABNORMAL HIGH (ref 0–32)
AST: 111 IU/L — ABNORMAL HIGH (ref 0–40)
Albumin/Globulin Ratio: 0.7 — ABNORMAL LOW (ref 1.2–2.2)
Albumin: 3.5 g/dL — ABNORMAL LOW (ref 3.9–4.9)
Alkaline Phosphatase: 121 IU/L (ref 44–121)
BUN/Creatinine Ratio: 7 — ABNORMAL LOW (ref 9–23)
BUN: 4 mg/dL — ABNORMAL LOW (ref 6–24)
Bilirubin Total: 0.7 mg/dL (ref 0.0–1.2)
CO2: 25 mmol/L (ref 20–29)
Calcium: 9.1 mg/dL (ref 8.7–10.2)
Chloride: 90 mmol/L — ABNORMAL LOW (ref 96–106)
Creatinine, Ser: 0.61 mg/dL (ref 0.57–1.00)
Globulin, Total: 5.1 g/dL — ABNORMAL HIGH (ref 1.5–4.5)
Glucose: 110 mg/dL — ABNORMAL HIGH (ref 70–99)
Potassium: 4.3 mmol/L (ref 3.5–5.2)
Sodium: 130 mmol/L — ABNORMAL LOW (ref 134–144)
Total Protein: 8.6 g/dL — ABNORMAL HIGH (ref 6.0–8.5)
eGFR: 110 mL/min/{1.73_m2} (ref >59)

## 2022-12-21 LAB — SPECIMEN STATUS REPORT

## 2023-01-10 ENCOUNTER — Ambulatory Visit: Payer: Self-pay | Admitting: Gerontology

## 2023-01-16 ENCOUNTER — Other Ambulatory Visit: Payer: Self-pay | Admitting: Gerontology

## 2023-01-16 DIAGNOSIS — R002 Palpitations: Secondary | ICD-10-CM

## 2023-01-18 ENCOUNTER — Other Ambulatory Visit: Payer: Self-pay

## 2023-01-18 ENCOUNTER — Encounter: Payer: Self-pay | Admitting: Gerontology

## 2023-01-18 ENCOUNTER — Ambulatory Visit: Payer: Self-pay | Admitting: Gerontology

## 2023-01-18 VITALS — BP 159/96 | HR 75 | Temp 97.4°F | Resp 16 | Ht 60.0 in | Wt 146.4 lb

## 2023-01-18 DIAGNOSIS — I10 Essential (primary) hypertension: Secondary | ICD-10-CM

## 2023-01-18 DIAGNOSIS — L24A9 Irritant contact dermatitis due friction or contact with other specified body fluids: Secondary | ICD-10-CM

## 2023-01-18 DIAGNOSIS — R945 Abnormal results of liver function studies: Secondary | ICD-10-CM

## 2023-01-18 DIAGNOSIS — K5900 Constipation, unspecified: Secondary | ICD-10-CM | POA: Insufficient documentation

## 2023-01-18 MED ORDER — LEVOFLOXACIN 750 MG PO TABS: 750.0000 mg | ORAL_TABLET | Freq: Every day | ORAL | 0 refills | Status: DC

## 2023-01-18 NOTE — Patient Instructions (Signed)

## 2023-01-18 NOTE — Progress Notes (Cosign Needed)
Established Patient Office Visit  Subjective   Patient ID: [name], female    DOB: [birth year]  Age: 47 y.o. MRN: [id number]  HPI: [name] is a 47 y/o female with history of alcohol abuse, anxiety, depression, and hypertension who presents for follow up regarding a wound on her left ankle. She states that the wound  and surrounding site is very painful with touch, she describes pain as sharp and  7/10 on pain scale. She takes ibuprofen and uses ice packs for this with minimal relief.  She endorses pain, and tightness,that tracks up her left calf. She props her left leg up on pillows when she's sleeping at night. The wound is draining sero sanguinous drainage.The wound has been there for about 2 months, previously treated with antibiotics. She completed her course of Bactrim that was prescribed during her last visit. The wound measures 2 cm x 1 1/2 cm and its covered with yellowish slough. She denies fever and chills.  Her blood pressure is elevated during this visit, 175/99, and she denies chest pain, light headedness, and vision changes. Her labs drawn on 12/20/22 reveal sodium 130, AST 111, and ALT 35. She is currently drinking 2-3 hard seltzers a couple of times per week. She denies drinking any seltzer today. She c/o productive cough with small amount of clear phlegm that started 3 weeks ago, but she reports that it's not bothering her. Overall she states that she's concerned about the non healing wound to her left leg and she denies any other complaint.  Review of Systems  Constitutional: Negative.   HENT: Negative.    Eyes: Negative.   Respiratory:  Positive for cough and sputum production.        Clear sputum  Cardiovascular:  Positive for palpitations.  Gastrointestinal:  Positive for constipation.       Last bowel movement 01/14/23  Genitourinary: Negative.   Musculoskeletal:  Positive for joint pain.       Left ankle  Skin:        Left leg wound 2 cm x 1.5 cm   Neurological: Negative.   Endo/Heme/Allergies: Negative.   Psychiatric/Behavioral: Negative.       Objective:     BP (!) 159/96 (BP Location: Right Arm, Patient Position: Sitting, Cuff Size: Normal)   Pulse 75   Temp (!) 97.4 F (36.3 C) (Oral)   Resp 16   Ht 5' (1.524 m)   Wt 146 lb 6.4 oz (66.4 kg)   LMP 04/23/2019 (Approximate)   SpO2 98%   BMI 28.59 kg/m  BP Readings from Last 3 Encounters:  01/18/23 (!) 159/96  12/20/22 (!) 160/90  12/03/22 120/60   Wt Readings from Last 3 Encounters:  01/18/23 146 lb 6.4 oz (66.4 kg)  12/20/22 142 lb 6.4 oz (64.6 kg)  12/02/22 140 lb (63.5 kg)      Physical Exam Constitutional:      General: She is not in acute distress.    Appearance: She is normal weight. She is not ill-appearing.  HENT:     Head: Normocephalic.     Nose: Nose normal.     Mouth/Throat:     Mouth: Mucous membranes are moist.     Pharynx: Oropharynx is clear.  Eyes:     Extraocular Movements: Extraocular movements intact.     Conjunctiva/sclera: Conjunctivae normal.     Pupils: Pupils are equal, round, and reactive to light.  Cardiovascular:     Rate and Rhythm: Normal rate and  regular rhythm.     Pulses: Decreased pulses.          Dorsalis pedis pulses are 1+ on the left side.     Heart sounds: Normal heart sounds.  Pulmonary:     Effort: Pulmonary effort is normal.     Breath sounds: Normal breath sounds.  Abdominal:     General: Bowel sounds are decreased.     Palpations: Abdomen is soft.  Musculoskeletal:     Cervical back: Normal range of motion.     Left lower leg: Tenderness present. 1+ Pitting Edema (negative Homan's sign) present.     Left ankle: Swelling present.  Skin:    General: Skin is warm and dry.     Capillary Refill: Capillary refill takes less than 2 seconds.     Findings: Erythema (left lower leg) present.  Neurological:     General: No focal deficit present.     Mental Status: She is alert and oriented to person, place,  and time. Mental status is at baseline.  Psychiatric:        Mood and Affect: Mood normal.        Behavior: Behavior normal.        Thought Content: Thought content normal.        Judgment: Judgment normal.     Last CBC Lab Results  Component Value Date   WBC 8.5 12/20/2022   HGB 11.5 12/20/2022   HCT 33.3 (L) 12/20/2022   MCV 93 12/20/2022   MCH 32.1 12/20/2022   RDW 12.9 12/20/2022   PLT 213 27/02/5008   Last metabolic panel Lab Results  Component Value Date   GLUCOSE 110 (H) 12/20/2022   NA 130 (L) 12/20/2022   K 4.3 12/20/2022   CL 90 (L) 12/20/2022   CO2 25 12/20/2022   BUN 4 (L) 12/20/2022   CREATININE 0.61 12/20/2022   EGFR 110 12/20/2022   CALCIUM 9.1 12/20/2022   PHOS 3.2 01/10/2021   PROT 8.6 (H) 12/20/2022   ALBUMIN 3.5 (L) 12/20/2022   LABGLOB 5.1 (H) 12/20/2022   AGRATIO 0.7 (L) 12/20/2022   BILITOT 0.7 12/20/2022   ALKPHOS 121 12/20/2022   AST 111 (H) 12/20/2022   ALT 35 (H) 12/20/2022   ANIONGAP 12 12/02/2022   Last lipids Lab Results  Component Value Date   CHOL 174 07/28/2021   HDL 53 07/28/2021   LDLCALC 104 (H) 07/28/2021   TRIG 93 07/28/2021   CHOLHDL 3.3 07/28/2021   Last hemoglobin A1c Lab Results  Component Value Date   HGBA1C 5.4 02/05/2020   Last thyroid functions Lab Results  Component Value Date   TSH 1.740 12/22/2021   Last vitamin D No results found for: "25OHVITD2", "25OHVITD3", "VD25OH" Last vitamin B12 and Folate No results found for: "VITAMINB12", "FOLATE"    The 10-year ASCVD risk score (Arnett DK, et al., 2019) is: 5.6%    Assessment & Plan:  1. Essential hypertension - Her blood pressure is not controlled today, but this could be related to the pain in her leg.  - She will continue taking her prescribed propranolol. - She was encouraged to follow DASH diet and alcohol abstinence.  2. Liver function study, abnormal - She was strongly encouraged on alcohol abstinence.  3. Wound drainage -  Wound is non  healing, unable to obtain wound culture, swollen and painful site, she was started on Levaquin, educated on medication side effects and advised to notify clinic. She was provided with wound care supplies.  -  She was advised to schedule an urgent appointment with the wound clinic. - She was advised to go to the ED if worsening symptoms.  - She will follow up at Ambulatory referral to [facility] - levofloxacin (LEVAQUIN) 750 MG tablet; Take 1 tablet (750 mg total) by mouth daily.  Dispense: 14 tablet; Refill: 0    4. Constipation, unspecified constipation type - She was encouraged to increase water intake, fiber diet and exercise as tolerated. She was advised to go to the ED for worsening symptoms.   She will follow up in the clinic in one month, 02/15/23, or if symptoms worsen or fail to improve.  Eloise Harman, FNP Student

## 2023-02-01 ENCOUNTER — Encounter: Payer: Medicaid Other | Attending: Internal Medicine | Admitting: Internal Medicine

## 2023-02-01 DIAGNOSIS — L97528 Non-pressure chronic ulcer of other part of left foot with other specified severity: Secondary | ICD-10-CM | POA: Diagnosis present

## 2023-02-01 DIAGNOSIS — L089 Local infection of the skin and subcutaneous tissue, unspecified: Secondary | ICD-10-CM | POA: Diagnosis not present

## 2023-02-01 DIAGNOSIS — G8929 Other chronic pain: Secondary | ICD-10-CM | POA: Insufficient documentation

## 2023-02-01 DIAGNOSIS — I10 Essential (primary) hypertension: Secondary | ICD-10-CM | POA: Diagnosis not present

## 2023-02-02 ENCOUNTER — Ambulatory Visit
Admission: RE | Admit: 2023-02-02 | Discharge: 2023-02-02 | Disposition: A | Discharge status: Home / Self Care | Payer: Medicaid Other | Attending: Internal Medicine | Admitting: Internal Medicine

## 2023-02-02 ENCOUNTER — Other Ambulatory Visit: Payer: Self-pay | Admitting: Internal Medicine

## 2023-02-02 ENCOUNTER — Ambulatory Visit
Admission: RE | Admit: 2023-02-02 | Discharge: 2023-02-02 | Disposition: A | Discharge status: Home / Self Care | Payer: Medicaid Other | Source: Ambulatory Visit | Attending: Internal Medicine | Admitting: Internal Medicine

## 2023-02-02 DIAGNOSIS — M869 Osteomyelitis, unspecified: Secondary | ICD-10-CM | POA: Insufficient documentation

## 2023-02-02 NOTE — Progress Notes (Signed)
[name], [name] ([id number]) [id number] Page 1 of 10 Visit Report for 02/01/2023 Allergy List Details Patient Name: Date of Service: [name] RRA [place], [place]. 02/01/2023 2:30 PM Medical Record Number: [id number] Patient Account Number: [id number] Date of Birth/Sex: Treating RN: 06/24/1976 (47 y.o. [name] Primary Care Terrisha Lopata: [name] Other Clinician: Referring [name]: Treating [name]/Extender: [name], [name] in Treatment: 0 Allergies Active Allergies Mapap (acetaminophen) Reaction: flushing Severity: Mild Allergy Notes Electronic Signature(s) Signed: 02/01/2023 4:19:55 PM By: [name] Loud MSN RN CNS WTA Entered By: [name] on 02/01/2023 15:24:32 -------------------------------------------------------------------------------- Arrival Information Details Patient Name: Date of Service: [name]. 02/01/2023 2:30 PM Medical Record Number: [id number] Patient Account Number: [id number] Date of Birth/Sex: Treating RN: 1976-06-17 (47 y.o. [name] Primary Care [name]: [name] Other Clinician: Referring [name]: Treating [name]/Extender: [name], [name] in Treatment: 0 Visit Information Patient Arrived: Ambulatory Arrival Time: 14:58 Accompanied By: self Transfer Assistance: None Patient Identification Verified: Yes Secondary Verification Process Completed: Yes Patient Requires Transmission-Based Precautions: No Patient Has Alerts: Yes Patient Alerts: NOT DIABETIC Electronic Signature(s) [name] ([id number]) [id number] Page 2 of 10 Signed: 02/01/2023 4:19:55 PM By: [name] Loud MSN RN CNS WTA Entered By: [name] on 02/01/2023 14:59:48 -------------------------------------------------------------------------------- Clinic Level of Care Assessment Details Patient Name: Date of Service: [name] RRA [place], [place]. 02/01/2023 2:30 PM Medical Record Number: [id number] Patient Account Number: [id number] Date of Birth/Sex: Treating RN: 1976-10-18 (47 y.o. [name] Primary Care [name]: [name] Other Clinician: Referring [name]: Treating [name]/Extender: [name], [name] in Treatment: 0 Clinic Level of Care Assessment Items TOOL 2 Quantity Score X- 1 0 Use when only an EandM is performed on the INITIAL visit ASSESSMENTS - Nursing Assessment / Reassessment X- 1 20 General Physical Exam (combine w/ comprehensive assessment (listed just below) when performed on new pt. evals) X- 1 25 Comprehensive Assessment (HX, ROS, Risk Assessments, Wounds Hx, etc.) ASSESSMENTS - Wound and Skin A ssessment / Reassessment X - Simple Wound Assessment / Reassessment - one wound 1 5 []$  - 0 Complex Wound Assessment / Reassessment - multiple wounds []$  - 0 Dermatologic / Skin Assessment (not related to wound area) ASSESSMENTS - Ostomy and/or Continence Assessment and Care []$  - 0 Incontinence Assessment and Management []$  - 0 Ostomy Care Assessment and Management (repouching, etc.) PROCESS - Coordination of Care X - Simple Patient / Family Education for ongoing care 1 15 []$  - 0 Complex (extensive) Patient / Family Education for ongoing care X- 1 10 Staff obtains Programmer, systems, Records, T Results / Process Orders est []$  - 0 Staff telephones HHA, Nursing Homes / Clarify orders / etc []$  - 0 Routine Transfer to another Facility (non-emergent condition) []$  - 0 Routine Hospital Admission (non-emergent condition) X- 1 15 New Admissions / Biomedical engineer / Ordering NPWT Apligraf, etc. , []$  - 0 Emergency Hospital Admission (emergent condition) X- 1 10 Simple Discharge Coordination []$  - 0 Complex (extensive) Discharge Coordination PROCESS - Special Needs []$  - 0 Pediatric / Minor Patient Management []$  - 0 Isolation Patient Management []$  - 0 Hearing / Language  / Visual special needs []$  - 0 Assessment of Community assistance (transportation, D/C planning, etc.) []$  - 0 Additional assistance / Altered mentation [name] ([id number]) [id number] Page 3 of 10 []$  - 0 Support Surface(s) Assessment (bed, cushion, seat, etc.) INTERVENTIONS - Wound Cleansing / Measurement X- 1 5 Wound Imaging (photographs -  any number of wounds) []$  - 0 Wound Tracing (instead of photographs) X- 1 5 Simple Wound Measurement - one wound []$  - 0 Complex Wound Measurement - multiple wounds X- 1 5 Simple Wound Cleansing - one wound []$  - 0 Complex Wound Cleansing - multiple wounds INTERVENTIONS - Wound Dressings X - Small Wound Dressing one or multiple wounds 1 10 []$  - 0 Medium Wound Dressing one or multiple wounds []$  - 0 Large Wound Dressing one or multiple wounds []$  - 0 Application of Medications - injection INTERVENTIONS - Miscellaneous []$  - 0 External ear exam []$  - 0 Specimen Collection (cultures, biopsies, blood, body fluids, etc.) []$  - 0 Specimen(s) / Culture(s) sent or taken to Lab for analysis []$  - 0 Patient Transfer (multiple staff / Civil Service fast streamer / Similar devices) []$  - 0 Simple Staple / Suture removal (25 or less) []$  - 0 Complex Staple / Suture removal (26 or more) []$  - 0 Hypo / Hyperglycemic Management (close monitor of Blood Glucose) X- 1 15 Ankle / Brachial Index (ABI) - do not check if billed separately Has the patient been seen at the hospital within the last three years: Yes Total Score: 140 Level Of Care: New/Established - Level 4 Electronic Signature(s) Signed: 02/01/2023 4:19:55 PM By: [name] Loud MSN RN CNS WTA Entered By: [name] on 02/01/2023 15:58:20 -------------------------------------------------------------------------------- Encounter Discharge Information Details Patient Name: Date of Service: [name] RRA [place], [place] URA Rice. 02/01/2023 2:30 PM Medical Record Number: [id number] Patient Account  Number: [id number] Date of Birth/Sex: Treating RN: 1976/08/18 (47 y.o. [name] Primary Care [name]: [name] Other Clinician: Referring [name]: Treating Jalena Vanderlinden/Extender: [name], [name] in Treatment: 0 Encounter Discharge Information Items Discharge Condition: Stable Ambulatory Status: Ambulatory Discharge Destination: Home Transportation: Private Auto Accompanied By: self Schedule Follow-up Appointment: Yes Clinical Summary of Care: CELSEY, NAKAMOTO ([id number]) [id number] Page 4 of 10 Electronic Signature(s) Signed: 02/01/2023 4:19:55 PM By: [name] Loud MSN RN CNS WTA Entered By: [name] on 02/01/2023 16:01:48 -------------------------------------------------------------------------------- Lower Extremity Assessment Details Patient Name: Date of Service: [name] [place], [name] 02/01/2023 2:30 PM Medical Record Number: [id number] Patient Account Number: [id number] Date of Birth/Sex: Treating RN: 02-21-1976 (47 y.o. [name] Primary Care Merrisa Skorupski: [name] Other Clinician: Referring Dahir Ayer: Treating Lathan Gieselman/Extender: [name], [name] in Treatment: 0 Edema Assessment Assessed: [Left: No] [Right: No] [Left: Edema] [Right: :] Calf Left: Right: Point of Measurement: 29 cm From Medial Instep 37.5 cm 37.5 cm Ankle Left: Right: Point of Measurement: 12 cm From Medial Instep 27 cm 22 cm Vascular Assessment Pulses: Dorsalis Pedis Palpable: [Left:Yes] [Right:Yes] Blood Pressure: Brachial: [Left:130] [Right:120] Ankle: [Left:Dorsalis Pedis: 150 1.15] [Right:Dorsalis Pedis: 148 1.14] Electronic Signature(s) Signed: 02/01/2023 4:19:55 PM By: [name] Loud MSN RN CNS WTA Entered By: [name] on 02/01/2023 15:23:34 -------------------------------------------------------------------------------- Multi Wound Chart Details Patient Name: Date of Service: [name]  RRA [place], [place] URA Rice. 02/01/2023 2:30 PM Medical Record Number: [id number] Patient Account Number: [id number] LASHONA, [place] ([id number]) [id number] Page 5 of 10 Date of Birth/Sex: Treating RN: 02/25/1976 (47 y.o. [name] Primary Care Camyra Vaeth: [name] Other Clinician: Referring Pryce Folts: Treating Jaiden Wahab/Extender: [name], [name] in Treatment: 0 Vital Signs Height(in): 60 Pulse(bpm): 73 Weight(lbs): 145 Blood Pressure(mmHg): 152/89 Body Mass Index(BMI): 28.3 Temperature(F): 97.4 Respiratory Rate(breaths/min): 16 [1:Photos:] [N/A:N/A] Left, Lateral Ankle N/A N/A Wound Location: Not Known N/A N/A Wounding Event: Cellulitis N/A N/A Primary Etiology: 01/19/2023 N/A N/A Date Acquired: 0  N/A N/A Rice of Treatment: Open N/A N/A Wound Status: No N/A N/A Wound Recurrence: 1.2x1x0.9 N/A N/A Measurements L x W x D (cm) 0.942 N/A N/A A (cm) : rea 0.848 N/A N/A Volume (cm) : Unclassifiable N/A N/A Classification: Large N/A N/A Exudate A mount: Purulent N/A N/A Exudate Type: yellow, brown, green N/A N/A Exudate Color: None Present (0%) N/A N/A Granulation A mount: Large (67-100%) N/A N/A Necrotic A mount: Fat Layer (Subcutaneous Tissue): Yes N/A N/A Exposed Structures: Fascia: No Tendon: No Muscle: No Joint: No Bone: No None N/A N/A Epithelialization: Treatment Notes Electronic Signature(s) Signed: 02/01/2023 4:19:55 PM By: [name] Loud MSN RN CNS WTA Entered By: [name] on 02/01/2023 16:01:07 -------------------------------------------------------------------------------- Multi-Disciplinary Care Plan Details Patient Name: Date of Service: [name] RRA [place], [place] URA Rice. 02/01/2023 2:30 PM Medical Record Number: [id number] Patient Account Number: [id number] Date of Birth/Sex: Treating RN: 28-May-1976 (47 y.o. [name] Primary Care Lupe Handley: [name] Other Clinician: Referring  Liandro Thelin: Treating Madeleyn Schwimmer/Extender: [name], [name] in Treatment: 0 [name] ([id number]) [id number] Page 6 of 10 Active Inactive Necrotic Tissue Nursing Diagnoses: Impaired tissue integrity related to necrotic/devitalized tissue Knowledge deficit related to management of necrotic/devitalized tissue Goals: Necrotic/devitalized tissue will be minimized in the wound bed Date Initiated: 02/01/2023 Target Resolution Date: 03/02/2023 Goal Status: Active Patient/caregiver will verbalize understanding of reason and process for debridement of necrotic tissue Date Initiated: 02/01/2023 Target Resolution Date: 03/02/2023 Goal Status: Active Interventions: Assess patient pain level pre-, during and post procedure and prior to discharge Provide education on necrotic tissue and debridement process Treatment Activities: Enzymatic debridement : 02/01/2023 Notes: Orientation to the Wound Care Program Nursing Diagnoses: Knowledge deficit related to the wound healing center program Goals: Patient/caregiver will verbalize understanding of the [facility] Date Initiated: 02/01/2023 Target Resolution Date: 02/15/2023 Goal Status: Active Interventions: Provide education on orientation to the wound center Notes: Wound/Skin Impairment Nursing Diagnoses: Impaired tissue integrity Knowledge deficit related to smoking impact on wound healing Knowledge deficit related to ulceration/compromised skin integrity Goals: Patient will demonstrate a reduced rate of smoking or cessation of smoking Date Initiated: 02/01/2023 Target Resolution Date: 03/02/2023 Goal Status: Active Patient will have a decrease in wound volume by X% from date: (specify in notes) Date Initiated: 02/01/2023 Target Resolution Date: 03/02/2023 Goal Status: Active Patient/caregiver will verbalize understanding of skin care regimen Date Initiated:  02/01/2023 Target Resolution Date: 02/17/2023 Goal Status: Active Ulcer/skin breakdown will have a volume reduction of 30% by week 4 Date Initiated: 02/01/2023 Target Resolution Date: 03/02/2023 Goal Status: Active Ulcer/skin breakdown will have a volume reduction of 50% by week 8 Date Initiated: 02/01/2023 Target Resolution Date: 04/02/2023 Goal Status: Active Ulcer/skin breakdown will have a volume reduction of 80% by week 12 Date Initiated: 02/01/2023 Target Resolution Date: 05/02/2023 Goal Status: Active Interventions: Assess patient/caregiver ability to obtain necessary supplies Assess patient/caregiver ability to perform ulcer/skin care regimen upon admission and as needed Assess ulceration(s) every visit Provide education on smoking Provide education on ulcer and skin care Treatment Activities: [name], [name] ([id number]) [id number] Page 7 of 10 Skin care regimen initiated : 02/01/2023 Smoking cessation education : 02/01/2023 Notes: Electronic Signature(s) Signed: 02/01/2023 4:19:55 PM By: [name] Loud MSN RN CNS WTA Entered By: [name] on 02/01/2023 16:00:48 -------------------------------------------------------------------------------- Pain Assessment Details Patient Name: Date of Service: [name] RRA [place], [place] URA Rice. 02/01/2023 2:30 PM Medical Record Number: [id number] Patient Account Number: [id number] Date of Birth/Sex: Treating RN: November 01, 1976 (47 y.o. F)  [name] Primary Care Isreal Moline: [name] Other Clinician: Referring Quynh Basso: Treating Lyden Redner/Extender: [name], [name] in Treatment: 0 Active Problems Location of Pain Severity and Description of Pain Patient Has Paino Yes Site Locations Pain Location: Pain in Ulcers With Dressing Change: Yes Duration of the Pain. Constant / Intermittento Constant Rate the pain. Current Pain Level: 6 Worst Pain Level: 8 Least Pain Level: 1 Tolerable Pain Level:  3 Character of Pain Describe the Pain: Tender Pain Management and Medication Current Pain Management: Medication: No Cold Application: No Rest: Yes Massage: No Activity: Yes T.E.N.S.: No Heat Application: No Leg drop or elevation: No Is the Current Pain Management Adequate: Inadequate How does your wound impact your activities of daily livingo Sleep: No Bathing: No Appetite: No Relationship With Others: No Bladder Continence: No Emotions: No Bowel Continence: No Work: No Toileting: No Drive: No Dressing: No HobbiesCHI, CRISE ([id number]) [id number] Page 8 of 10 Electronic Signature(s) Signed: 02/01/2023 4:19:55 PM By: [name] Loud MSN RN CNS WTA Entered By: [name] on 02/01/2023 15:01:14 -------------------------------------------------------------------------------- Patient/Caregiver Education Details Patient Name: Date of Service: [name] RRA [place] 2/14/2024andnbsp2:30 PM Medical Record Number: [id number] Patient Account Number: [id number] Date of Birth/Gender: Treating RN: Jul 17, 1976 (47 y.o. [name] Primary Care Physician: [name] Other Clinician: Referring Physician: Treating Physician/Extender: Syliva Overman Rice in Treatment: 0 Education Assessment Education Provided To: Patient Education Topics Provided Welcome T [facility]New Patient Packet: o Handouts: Welcome T [facility] o Methods: Explain/Verbal Responses: State content correctly Wound/Skin Impairment: Handouts: Caring for Your Ulcer Methods: Explain/Verbal Responses: State content correctly Electronic Signature(s) Signed: 02/01/2023 4:19:55 PM By: [name] Loud MSN RN CNS WTA Entered By: [name] on 02/01/2023 15:58:51 -------------------------------------------------------------------------------- Wound Assessment Details Patient Name: Date of Service: [name] [place], [place] URA Rice.  02/01/2023 2:30 PM Medical Record Number: [id number] Patient Account Number: [id number] Date of Birth/Sex: Treating RN: March 25, 1976 (47 y.o. [name] Primary Care Pinkney Venard: [name] Other Clinician: Referring Draeden Kellman: Treating Hannahmarie Asberry/Extender: [name], [name] in Treatment: 0 Wound Status Wound Number: 1 Primary Etiology: Cellulitis [name], [name] ([id number]) [id number] Page 9 of 10 Wound Location: Left, Lateral Ankle Wound Status: Open Wounding Event: Not Known Date Acquired: 01/19/2023 Rice Of Treatment: 0 Clustered Wound: No Photos Wound Measurements Length: (cm) 1.2 Width: (cm) 1 Depth: (cm) 0.9 Area: (cm) 0.942 Volume: (cm) 0.848 % Reduction in Area: % Reduction in Volume: Epithelialization: None Wound Description Classification: Unclassifiable Exudate Amount: Large Exudate Type: Purulent Exudate Color: yellow, brown, green Foul Odor After Cleansing: No Slough/Fibrino Yes Wound Bed Granulation Amount: None Present (0%) Exposed Structure Necrotic Amount: Large (67-100%) Fascia Exposed: No Necrotic Quality: Adherent Slough Fat Layer (Subcutaneous Tissue) Exposed: Yes Tendon Exposed: No Muscle Exposed: No Joint Exposed: No Bone Exposed: No Treatment Notes Wound #1 (Ankle) Wound Laterality: Left, Lateral Cleanser Vashe 5.8 (oz) Discharge Instruction: Use vashe 5.8 (oz) as directed Peri-Wound Care Topical Primary Dressing Gauze Discharge Instruction: As directed: dry, moistened with saline Secondary Dressing ABD Pad 5x9 (in/in) Discharge Instruction: Cover with ABD pad Secured With Kerlix Roll Sterile or Non-Sterile 6-ply 4.5x4 (yd/yd) Discharge Instruction: Apply Kerlix as directed Compression Wrap Compression Stockings Add-Ons Electronic Signature(s) Signed: 02/01/2023 4:19:55 PM By: [name] Loud MSN RN CNS [name], [name] ([id number]) [id number]  Page 10 of 10 Entered By: [name] on 02/01/2023 15:10:36 -------------------------------------------------------------------------------- Vitals Details Patient Name: Date of Service: [name] [place], [place]  URA Rice. 02/01/2023 2:30 PM Medical Record Number: [id number] Patient Account Number: [id number] Date of Birth/Sex: Treating RN: [birth year] (47 y.o. [name] Primary Care Legion Discher: [name] Other Clinician: Referring Pasco Marchitto: Treating Ethelwyn Gilbertson/Extender: [name], [name] in Treatment: 0 Vital Signs Time Taken: 15:01 Temperature (F): 97.4 Height (in): 60 Pulse (bpm): 73 Source: Stated Respiratory Rate (breaths/min): 16 Weight (lbs): 145 Blood Pressure (mmHg): 152/89 Source: Stated Reference Range: 80 - 120 mg / dl Body Mass Index (BMI): 28.3 Electronic Signature(s) Signed: 02/01/2023 4:19:55 PM By: [name] Loud MSN RN CNS WTA Entered By: [name] on 02/01/2023 15:03:55

## 2023-02-02 NOTE — Progress Notes (Signed)
[name], Rice ([id number]) [id number] Page 1 of 5 Visit Report for 02/01/2023 Abuse Risk Screen Details Patient Name: Date of Service: [place] RRA [name], [place]. 02/01/2023 2:30 PM Medical Record Number: [id number] Patient Account Number: [id number] Date of Birth/Sex: Treating RN: [birth year] (47 y.o. [name] Primary Care Bowe Sidor: Caryl Asp Other Clinician: Referring Baruc Tugwell: Treating Ismelda Weatherman/Extender: Alease Medina, [name] Weeks in Treatment: 0 Abuse Risk Screen Items Answer Electronic Signature(s) Signed: 02/01/2023 4:19:55 PM By: Rosalio Loud MSN RN CNS WTA Entered By: Rosalio Loud on 02/01/2023 15:26:56 -------------------------------------------------------------------------------- Activities of Daily Living Details Patient Name: Date of Service: [place], [place]. 02/01/2023 2:30 PM Medical Record Number: [id number] Patient Account Number: [id number] Date of Birth/Sex: Treating RN: 23-Jul-1976 (47 y.o. [name] Primary Care Athelene Hursey: Caryl Asp Other Clinician: Referring Clarence Cogswell: Treating Toniesha Zellner/Extender: Alease Medina, [name] Weeks in Treatment: 0 Activities of Daily Living Items Answer Activities of Daily Living (Please select one for each item) Drive Automobile Not Able T Medications ake Completely Able Use T elephone Completely Able Care for Appearance Completely Able Use T oilet Completely Able Bath / Shower Completely Able Dress Self Completely Able Feed Self Completely Able Walk Completely Able Get In / Out Bed Completely Able Housework Completely Able Prepare Meals Completely Able Handle Money Completely Able Shop for Self Completely [name], [name] ([id number]) [id number] Page 2 of 5 Electronic Signature(s) Signed: 02/01/2023 4:19:55 PM By: Rosalio Loud MSN RN CNS WTA Entered By: Rosalio Loud on 02/01/2023  15:27:19 -------------------------------------------------------------------------------- Education Screening Details Patient Name: Date of Service: [place], [name] 02/01/2023 2:30 PM Medical Record Number: [id number] Patient Account Number: [id number] Date of Birth/Sex: Treating RN: 07/27/1976 (47 y.o. [name] Primary Care Misheel Gowans: Caryl Asp Other Clinician: Referring Damaya Channing: Treating Kyan Yurkovich/Extender: Alease Medina, [name] Weeks in Treatment: 0 Learning Preferences/Education Level/Primary Language Learning Preference: Explanation, Demonstration Highest Education Level: College or Above Preferred Language: English Cognitive Barrier Language Barrier: No Translator Needed: No Memory Deficit: No Emotional Barrier: No Cultural/Religious Beliefs Affecting Medical Care: No Physical Barrier Impaired Vision: Yes Impaired Hearing: No Decreased Hand dexterity: No Knowledge/Comprehension Knowledge Level: High Comprehension Level: High Ability to understand written instructions: High Ability to understand verbal instructions: High Motivation Anxiety Level: Calm Cooperation: Cooperative Education Importance: Acknowledges Need Interest in Health Problems: Asks Questions Perception: Coherent Willingness to Engage in Self-Management High Activities: Readiness to Engage in Self-Management High Activities: Electronic Signature(s) Signed: 02/01/2023 4:19:55 PM By: Rosalio Loud MSN RN CNS WTA Entered By: Rosalio Loud on 02/01/2023 15:27:56 Ulysees Barns ([id number]) [id number] Page 3 of 5 -------------------------------------------------------------------------------- Fall Risk Assessment Details Patient Name: Date of Service: [place] RRA [name], [place]. 02/01/2023 2:30 PM Medical Record Number: [id number] Patient Account Number: [id number] Date of Birth/Sex: Treating RN: Sep 09, 1976 (47 y.o. [name] Primary  Care [name]: Caryl Asp Other Clinician: Referring [name]: Treating [name]/Extender: Alease Medina, [name] Weeks in Treatment: 0 Fall Risk Assessment Items Have you had 2 or more falls in the last 12 monthso 0 No Have you had any fall that resulted in injury in the last 12 monthso 0 No FALLS RISK SCREEN History of falling - immediate or within 3 months 0 No Secondary diagnosis (Do you have 2 or more medical diagnoseso) 0 No Ambulatory aid None/bed rest/wheelchair/nurse 0 No Crutches/cane/walker 0 No Furniture 0 No Intravenous therapy Access/Saline/Heparin Lock 0 No Gait/Transferring Normal/ bed rest/ wheelchair 0 No Weak (short steps with  or without shuffle, stooped but able to lift head while walking, may seek 0 No support from furniture) Impaired (short steps with shuffle, may have difficulty arising from chair, head down, impaired 0 No balance) Mental Status Oriented to own ability 0 Yes Electronic Signature(s) Signed: 02/01/2023 4:19:55 PM By: Rosalio Loud MSN RN CNS WTA Entered By: Rosalio Loud on 02/01/2023 15:28:24 -------------------------------------------------------------------------------- Foot Assessment Details Patient Name: Date of Service: CA [name], [name] 02/01/2023 2:30 PM Medical Record Number: [id number] Patient Account Number: [id number] Date of Birth/Sex: Treating RN: September 30, 1976 (47 y.o. [name] Primary Care [name]: Caryl Asp Other Clinician: Referring Jatavius Ellenwood: Treating Alaycia Eardley/Extender: Alease Medina, [name] Weeks in Treatment: 0 Foot Assessment Items Site Locations [place], [place] ([id number]) [id number] Page 4 of 5 + = Sensation present, - = Sensation absent, C = Callus, U = Ulcer R = Redness, W = Warmth, M = Maceration, PU = Pre-ulcerative lesion F = Fissure, S = Swelling, D = Dryness Assessment Right: Left: Other Deformity: No No Prior Foot  Ulcer: No No Prior Amputation: No No Charcot Joint: No No Ambulatory Status: Ambulatory Without Help Gait: Steady Electronic Signature(s) Signed: 02/01/2023 4:19:55 PM By: Rosalio Loud MSN RN CNS WTA Entered By: Rosalio Loud on 02/01/2023 15:30:14 -------------------------------------------------------------------------------- Nutrition Risk Screening Details Patient Name: Date of Service: [place], [place] M. 02/01/2023 2:30 PM Medical Record Number: [id number] Patient Account Number: [id number] Date of Birth/Sex: Treating RN: 10/01/1976 (47 y.o. [name] Primary Care Elijah Michaelis: Caryl Asp Other Clinician: Referring Mykell Rawl: Treating Cleora Karnik/Extender: Alease Medina, [name] Weeks in Treatment: 0 Height (in): 60 Weight (lbs): 145 Body Mass Index (BMI): 28.3 Nutrition Risk Screening Items Score Screening NUTRITION RISK SCREEN: I have an illness or condition that made me change the kind and/or amount of food I eat 0 No I eat fewer than two meals per day 0 No I eat few fruits and vegetables, or milk products 0 No I have three or more drinks of beer, liquor or wine almost every day 0 No I have tooth or mouth problems that make it hard for me to eat 0 No I don't always have enough money to buy the food I need 0 No KOLBEE, QUILL ([id number]) [phone] [id number] Page 5 of 5 I eat alone most of the time 0 No I take three or more different prescribed or over-the-counter drugs a day 0 No Without wanting to, I have lost or gained 10 pounds in the last six months 0 No I am not always physically able to shop, cook and/or feed myself 0 No Nutrition Protocols Good Risk Protocol 0 No interventions needed Moderate Risk Protocol High Risk Proctocol Risk Level: Good Risk Score: 0 Electronic Signature(s) Signed: 02/01/2023 4:19:55 PM By: Rosalio Loud MSN RN CNS WTA Entered By: Rosalio Loud on 02/01/2023 15:28:41

## 2023-02-02 NOTE — Progress Notes (Signed)
METTA, PINGREE ([id number]) [id number] Page 1 of 8 Visit Report for 02/01/2023 Chief Complaint Document Details Patient Name: Date of Service: [name], [name]. 02/01/2023 2:30 PM Medical Record Number: [id number] Patient Account Number: [id number] Date of Birth/Sex: Treating RN: January 28, 1976 (47 y.o. [name] Primary Care Provider: Caryl Asp Other Clinician: Referring Provider: Treating Provider/Extender: Alease Medina, [name]: 0 Information Obtained from: Patient Chief Complaint 02/01/2023; Left ankle wound Electronic Signature(s) Signed: 02/01/2023 4:12:52 PM By: Kalman Shan DO Signed: 02/01/2023 4:19:55 PM By: Rosalio Loud MSN RN CNS WTA Entered By: Rosalio Loud on 02/01/2023 16:01:15 -------------------------------------------------------------------------------- HPI Details Patient Name: Date of Service: [name] [place], [name] [name] M. 02/01/2023 2:30 PM Medical Record Number: [id number] Patient Account Number: [id number] Date of Birth/Sex: Treating RN: 1976/09/30 (47 y.o. [name] Primary Care Provider: Caryl Asp Other Clinician: Referring Provider: Treating Provider/Extender: Alease Medina, [name]: 0 History of Present Illness HPI Description: 02/01/2023 [name] is a 47 year old female with a past medical history of alcohol/drug abuse and hypertension that presents to the clinic for a 37-monthhistory of nonhealing ulcer to the left lateral ankle. She states it started out as cellulitis And subsequently developed a wound to the area. She has tried Keflex, doxycycline and Levaquin for this issue. She reports that the erythema had improved however still has chronic pain to the wound site with increased swelling to the ankle. She has been keeping the area covered. Electronic Signature(s) Signed: 02/01/2023 4:12:52 PM By: HKalman ShanDO Entered  By: HKalman Shanon 02/01/2023 15:57:17 CUlysees Rice([id number] Page 2 of 8 -------------------------------------------------------------------------------- Physical Exam Details Patient Name: Date of Service: [name] [name] 02/01/2023 2:30 PM Medical Record Number: [id number] Account Number: [id number] of Birth/Sex: Treating RN: 7April 27, 1977(47y.o. FDrema PryPrimary Care Provider: ICaryl AspOther Clinician: Referring Provider: Treating Provider/Extender: HAlease Medina [name]: 0 Constitutional . Cardiovascular . Psychiatric . Notes Left foot: T the lateral ankle there is an open wound with nonviable tissue throughout. Increased swelling, erythema and warmth to the ankle. Pain on palpation. o No purulent drainage noted. Electronic Signature(s) Signed: 02/01/2023 4:12:52 PM By: HKalman ShanDO Entered By: HKalman Shanon 02/01/2023 15:57:51 -------------------------------------------------------------------------------- Physician Orders Details Patient Name: Date [name] 02/01/2023 2:30 PM Medical Record Number: [id number] Account Number: [id number] of Birth/Sex: Treating RN: [birth year](47y.o. FDrema PryPrimary Care Provider: ICaryl AspOther Clinician: Referring Provider: Treating Provider/Extender: HAlease Medina [name]: 0 Verbal / Phone Orders: No Diagnosis Coding Follow-up Appointments Return Appointment in 1 week. Bathing/ Shower/ Hygiene Clean wound with Normal Saline or wound cleanser. - Vashe Anesthetic (Use 'Patient Medications' Section for Anesthetic Order Entry) Lidocaine applied to wound bed Wound Rice Wound #1 - Ankle Wound Laterality: Left, Lateral CYANELYS, JOKI([id number] Page 3 of 8 Cleanser: Vashe  5.8 (oz) 1 x Per Day/30 Days Discharge Instructions: Use vashe 5.8 (oz) as directed Prim Dressing: Gauze 1 x Per Day/30 Days ary Discharge Instructions: As directed: dry, moistened with saline Secondary Dressing: ABD Pad 5x9 (in/in) 1 x Per Day/30 Days Discharge Instructions: Cover with ABD pad Secured With: Kerlix Roll Sterile or Non-Sterile 6-ply 4.5x4 (yd/yd) 1 x Per Day/30 Days Discharge Instructions: Apply Kerlix as directed Radiology X-ray, ankle Electronic Signature(s) Signed: 02/01/2023 4:12:52 PM By: HKalman Shan  DO Previous Signature: 02/01/2023 3:46:41 PM Version By: Kalman Shan DO Entered By: Kalman Shan on 02/01/2023 16:00:27 -------------------------------------------------------------------------------- Problem List Details Patient Name: Date of Service: [name] [place], [name] [name] M. 02/01/2023 2:30 PM Medical Record Number: [id number] Patient Account Number: [id number] Date of Birth/Sex: Treating RN: [birth year] (47 y.o. [name] Primary Care Provider: Caryl Asp Other Clinician: Referring Provider: Treating Provider/Extender: Alease Medina, [name]: 0 Active Problems ICD-10 Encounter Code Description Active Date MDM Diagnosis L97.528 Non-pressure chronic ulcer of other part of left foot with other specified 02/01/2023 No Yes severity L08.9 Local infection of the skin and subcutaneous tissue, unspecified 02/01/2023 No Yes Inactive Problems Resolved Problems Electronic Signature(s) Signed: 02/01/2023 4:12:52 PM By: Kalman Shan DO Signed: 02/01/2023 4:19:55 PM By: Rosalio Loud MSN RN CNS WTA Entered By: Rosalio Loud on 02/01/2023 16:00:59 [name] ([id number]) [id number] Page 4 of 8 -------------------------------------------------------------------------------- Progress Note Details Patient Name: Date of Service: [name] [name] [place], [name]. 02/01/2023 2:30 PM Medical Record  Number: [id number] Patient Account Number: [id number] Date of Birth/Sex: Treating RN: Jun 18, 1976 (47 y.o. [name] Primary Care Provider: Caryl Asp Other Clinician: Referring Provider: Treating Provider/Extender: Alease Medina, [name]: 0 Subjective Chief Complaint Information obtained from Patient 02/01/2023; Left ankle wound History of Present Illness (HPI) 02/01/2023 [name] is a 47 year old female with a past medical history of alcohol/drug abuse and hypertension that presents to the clinic for a 34-monthhistory of nonhealing ulcer to the left lateral ankle. She states it started out as cellulitis And subsequently developed a wound to the area. She has tried Keflex, doxycycline and Levaquin for this issue. She reports that the erythema had improved however still has chronic pain to the wound site with increased swelling to the ankle. She has been keeping the area covered. Patient History Information obtained from Patient. Allergies Mapap (acetaminophen) (Severity: Mild, Reaction: flushing) Social History Current every day smoker - 5 cig/day, Marital Status - Divorced, Alcohol Use - Moderate - couple times week, Drug Use - No History, Caffeine Use - Rarely. Medical History Eyes Denies history of Cataracts, Glaucoma, Optic Neuritis Cardiovascular Patient has history of Hypertension Psychiatric Denies history of Anorexia/bulimia, Confinement Anxiety Review of Systems (ROS) Constitutional Symptoms (General Health) Denies complaints or symptoms of Fatigue, Fever, Chills, Marked Weight Change. Eyes Complains or has symptoms of Glasses / Contacts. Denies complaints or symptoms of Dry Eyes, Vision Changes. Ear/Nose/Mouth/Throat Denies complaints or symptoms of Difficult clearing ears, Sinusitis. Hematologic/Lymphatic Denies complaints or symptoms of Bleeding / Clotting Disorders, Human Immunodeficiency  Virus. Respiratory Denies complaints or symptoms of Chronic or frequent coughs, Shortness of Breath. Gastrointestinal Denies complaints or symptoms of Frequent diarrhea, Nausea, Vomiting. Endocrine Denies complaints or symptoms of Hepatitis, Thyroid disease, Polydypsia (Excessive Thirst). Genitourinary Denies complaints or symptoms of Kidney failure/ Dialysis, Incontinence/dribbling. Immunological Denies complaints or symptoms of Hives, Itching. Integumentary (Skin) Complains or has symptoms of Wounds. Musculoskeletal Denies complaints or symptoms of Muscle Pain, Muscle Weakness. Neurologic Denies complaints or symptoms of Numbness/parasthesias, Focal/Weakness. Psychiatric Denies complaints or symptoms of Anxiety, Claustrophobia. CTIYANNA, ALERS([id number] Page 5 of 8 Objective Constitutional Vitals Time Taken: 3:01 PM, Height: 60 in, Source: Stated, Weight: 145 lbs, Source: Stated, BMI: 28.3, Temperature: 97.4 F, Pulse: 73 bpm, Respiratory Rate: 16 breaths/min, Blood Pressure: 152/89 mmHg. General Notes: Left foot: T the lateral ankle there is an open wound with nonviable tissue throughout. Increased swelling, erythema and warmth to the ankle.  o Pain on palpation. No purulent drainage noted. Integumentary (Hair, Skin) Wound #1 status is Open. Original cause of wound was Not Known. The date acquired was: 01/19/2023. The wound is located on the Left,Lateral Ankle. The wound measures 1.2cm length x 1cm width x 0.9cm depth; 0.942cm^2 area and 0.848cm^3 volume. There is Fat Layer (Subcutaneous Tissue) exposed. There is a large amount of purulent drainage noted. There is no granulation within the wound bed. There is a large (67-100%) amount of necrotic tissue within the wound bed including Adherent Slough. Assessment Active Problems ICD-10 Non-pressure chronic ulcer of other part of left foot with other specified severity Local infection of  the skin and subcutaneous tissue, unspecified Patient presents with a 34-monthhistory of nonhealing ulcer to the left ankle. She states this started out as cellulitis. Unfortunately there is increased warmth, erythema and pain to the wound site. I recommended another course of oral antibiotics. Based on appearance and chronicity I am concerned that there is osteomyelitis to the left ankle. I recommended an x-ray and based on findings will likely need an MRI. For now I recommended Vashe wet-to-dry dressings and aggressive offloading. Follow-up in 1 week. Plan Follow-up Appointments: Return Appointment in 1 week. Bathing/ Shower/ Hygiene: Clean wound with Normal Saline or wound cleanser. - Vashe Anesthetic (Use 'Patient Medications' Section for Anesthetic Order Entry): Lidocaine applied to wound bed Radiology ordered were: X-ray, ankle WOUND #1: - Ankle Wound Laterality: Left, Lateral Cleanser: Vashe 5.8 (oz) 1 x Per Day/30 Days Discharge Instructions: Use vashe 5.8 (oz) as directed Prim Dressing: Gauze 1 x Per Day/30 Days ary Discharge Instructions: As directed: dry, moistened with saline Secondary Dressing: ABD Pad 5x9 (in/in) 1 x Per Day/30 Days Discharge Instructions: Cover with ABD pad Secured With: Kerlix Roll Sterile or Non-Sterile 6-ply 4.5x4 (yd/yd) 1 x Per Day/30 Days Discharge Instructions: Apply Kerlix as directed 1. Augmentin and doxycycline 2. Vashe wet-to-dry dressings 3. Aggressive offloading 4. Follow-up in 1 week 5. X-ray of left ankle Electronic Signature(s) Signed: 02/01/2023 4:12:52 PM By: HKalman ShanDO Entered By: HKalman Shanon 02/01/2023 15:59:58 CUlysees Rice([id number] Page 6 of 8 -------------------------------------------------------------------------------- ROS/PFSH Details Patient Name: Date of Service: [name] [name] 02/01/2023 2:30 PM Medical Record Number: [id number] Account  Number: [id number] of Birth/Sex: Treating RN: [birth year](47y.o. FDrema PryPrimary Care Provider: ICaryl AspOther Clinician: Referring Provider: Treating Provider/Extender: HAlease Medina [name]: 0 Information Obtained From Patient Constitutional Symptoms (General Health) Complaints and Symptoms: Negative for: Fatigue; Fever; Chills; Marked Weight Change Eyes Complaints and Symptoms: Positive for: Glasses / Contacts Negative for: Dry Eyes; Vision Changes Medical History: Negative for: Cataracts; Glaucoma; Optic Neuritis Ear/Nose/Mouth/Throat Complaints and Symptoms: Negative for: Difficult clearing ears; Sinusitis Hematologic/Lymphatic Complaints and Symptoms: Negative for: Bleeding / Clotting Disorders; Human Immunodeficiency Virus Respiratory Complaints and Symptoms: Negative for: Chronic or frequent coughs; Shortness of Breath Gastrointestinal Complaints and Symptoms: Negative for: Frequent diarrhea; Nausea; Vomiting Endocrine Complaints and Symptoms: Negative for: Hepatitis; Thyroid disease; Polydypsia (Excessive Thirst) Genitourinary Complaints and Symptoms: Negative for: Kidney failure/ Dialysis; Incontinence/dribbling Immunological Complaints and Symptoms: Negative for: Hives; Itching Integumentary (Skin) Complaints and Symptoms: Positive for: Wounds [name], [name]([id number] Page 7 of 8 Musculoskeletal Complaints and Symptoms: Negative for: Muscle Pain; Muscle Weakness Neurologic Complaints and Symptoms: Negative for: Numbness/parasthesias; Focal/Weakness Psychiatric Complaints and Symptoms: Negative for: Anxiety; Claustrophobia Medical History: Negative for: Anorexia/bulimia; Confinement Anxiety Cardiovascular Medical History: Positive for: Hypertension Oncologic  Immunizations Pneumococcal Vaccine: Received Pneumococcal Vaccination: No Implantable  Devices None Family and Social History Current every day smoker - 5 cig/day; Marital Status - Divorced; Alcohol Use: Moderate - couple times week; Drug Use: No History; Caffeine Use: Rarely Electronic Signature(s) Signed: 02/01/2023 3:27:54 PM By: Kalman Shan DO Signed: 02/01/2023 4:19:55 PM By: Rosalio Loud MSN RN CNS WTA Entered By: Rosalio Loud on 02/01/2023 15:26:47 -------------------------------------------------------------------------------- SuperBill Details Patient Name: Date of Service: [name] [name], [name] [name] M. 02/01/2023 Medical Record Number: [id number] Patient Account Number: [id number] Date of Birth/Sex: Treating RN: 1976-02-08 (47 y.o. [name] Primary Care Provider: Caryl Asp Other Clinician: Referring Provider: Treating Provider/Extender: Alease Medina, [name]: 0 Diagnosis Coding ICD-10 Codes Code Description [phone] Non-pressure chronic ulcer of other part of left foot with other specified severity L08.9 Local infection of the skin and subcutaneous tissue, unspecified Facility Procedures : [name] Code: [id number] , [name] ([id number]) Description: [place] VISIT-LEV 4 EST PT [id number] Modifier: [id number] Quantity: 1 [id number] Page 8 of 8 Physician Procedures : CPT4 Code Description Modifier [id number] [phone] - WC PHYS LEVEL 4 - NEW PT ICD-10 Diagnosis Description L97.528 Non-pressure chronic ulcer of other part of left foot with other specified severity L08.9 Local infection of the skin and subcutaneous tissue,  unspecified Quantity: 1 Electronic Signature(s) Signed: 02/01/2023 4:12:52 PM By: Kalman Shan DO Entered By: Kalman Shan on 02/01/2023 16:00:18

## 2023-02-08 ENCOUNTER — Ambulatory Visit: Payer: Self-pay | Admitting: Internal Medicine

## 2023-02-08 ENCOUNTER — Encounter: Payer: Medicaid Other | Admitting: Internal Medicine

## 2023-02-09 ENCOUNTER — Other Ambulatory Visit: Payer: Self-pay | Admitting: Internal Medicine

## 2023-02-09 DIAGNOSIS — L97528 Non-pressure chronic ulcer of other part of left foot with other specified severity: Secondary | ICD-10-CM

## 2023-02-14 ENCOUNTER — Ambulatory Visit
Admission: RE | Admit: 2023-02-14 | Discharge: 2023-02-14 | Disposition: A | Discharge status: Home / Self Care | Payer: Medicaid Other | Source: Ambulatory Visit | Attending: Internal Medicine | Admitting: Internal Medicine

## 2023-02-14 DIAGNOSIS — L97528 Non-pressure chronic ulcer of other part of left foot with other specified severity: Secondary | ICD-10-CM | POA: Insufficient documentation

## 2023-02-14 MED ORDER — GADOBUTROL 1 MMOL/ML IV SOLN
6.0000 mL | Freq: Once | INTRAVENOUS | Status: AC | PRN
  Administered 2023-02-14: 6 mL via INTRAVENOUS

## 2023-02-15 ENCOUNTER — Ambulatory Visit: Payer: Self-pay | Admitting: Gerontology

## 2023-02-15 ENCOUNTER — Encounter (HOSPITAL_BASED_OUTPATIENT_CLINIC_OR_DEPARTMENT_OTHER): Payer: Medicaid Other | Admitting: Internal Medicine

## 2023-02-15 DIAGNOSIS — L97528 Non-pressure chronic ulcer of other part of left foot with other specified severity: Secondary | ICD-10-CM | POA: Diagnosis not present

## 2023-02-15 DIAGNOSIS — L089 Local infection of the skin and subcutaneous tissue, unspecified: Secondary | ICD-10-CM | POA: Diagnosis not present

## 2023-02-16 NOTE — Progress Notes (Signed)
[name], [name] ([id number]) [id number] Page 1 of 10 Visit Report for 02/15/2023 Arrival Information Details Patient Name: Date of Service: [place], [place]. 02/15/2023 3:15 PM Medical Record Number: [id number] Patient Account Number: [id number] Date of Birth/Sex: Treating RN: [birth year] (47 Rice.o. [name] Primary Care [name]: [name] Other Clinician: Referring [name]: Treating [name]/Extender: [name], [name] in Treatment: 2 Visit Information History Since Last Visit Added or deleted any medications: No Patient Arrived: Ambulatory Has Dressing in Place as Prescribed: Yes Arrival Time: 15:43 Pain Present Now: No Accompanied By: self Transfer Assistance: None Patient Identification Verified: Yes Secondary Verification Process Completed: Yes Patient Requires Transmission-Based Precautions: No Patient Has Alerts: Yes Patient Alerts: NOT DIABETIC Electronic Signature(s) Signed: 02/15/2023 4:37:18 PM By: [name] Loud MSN RN CNS WTA Entered By: [name] on 02/15/2023 16:37:18 -------------------------------------------------------------------------------- Clinic Level of Care Assessment Details Patient Name: Date of Service: [name], [name] 02/15/2023 3:15 PM Medical Record Number: [id number] Patient Account Number: [id number] Date of Birth/Sex: Treating RN: [name][birth year]34 Rice.o. [name] Primary Care [name]: [name] Other Clinician: Referring [name]: Treating [name] in Treatment: 2 Clinic Level of Care Assessment Items TOOL 4 Quantity Score X- 1 0 Use when only an EandM is performed on FOLLOW-UP visit ASSESSMENTS - Nursing Assessment / Reassessment X- 1 10 Reassessment of Co-morbidities (includes updates in patient status) X- 1 5 Reassessment of Adherence to Treatment Plan ASSESSMENTS - Wound and Skin A ssessment /  Reassessment X - Simple Wound Assessment / Reassessment - one wound 1 5 [name] ([id number]) [id number] Page 2 of 10 '[]'$  - 0 Complex Wound Assessment / Reassessment - multiple wounds '[]'$  - 0 Dermatologic / Skin Assessment (not related to wound area) ASSESSMENTS - Focused Assessment '[]'$  - 0 Circumferential Edema Measurements - multi extremities '[]'$  - 0 Nutritional Assessment / Counseling / Intervention '[]'$  - 0 Lower Extremity Assessment (monofilament, tuning fork, pulses) '[]'$  - 0 Peripheral Arterial Disease Assessment (using hand held doppler) ASSESSMENTS - Ostomy and/or Continence Assessment and Care '[]'$  - 0 Incontinence Assessment and Management '[]'$  - 0 Ostomy Care Assessment and Management (repouching, etc.) PROCESS - Coordination of Care X - Simple Patient / Family Education for ongoing care 1 15 '[]'$  - 0 Complex (extensive) Patient / Family Education for ongoing care X- 1 10 Staff obtains Programmer, systems, Records, T Results / Process Orders est '[]'$  - 0 Staff telephones HHA, Nursing Homes / Clarify orders / etc '[]'$  - 0 Routine Transfer to another Facility (non-emergent condition) '[]'$  - 0 Routine Hospital Admission (non-emergent condition) '[]'$  - 0 New Admissions / Biomedical engineer / Ordering NPWT Apligraf, etc. , '[]'$  - 0 Emergency Hospital Admission (emergent condition) X- 1 10 Simple Discharge Coordination '[]'$  - 0 Complex (extensive) Discharge Coordination PROCESS - Special Needs '[]'$  - 0 Pediatric / Minor Patient Management '[]'$  - 0 Isolation Patient Management '[]'$  - 0 Hearing / Language / Visual special needs '[]'$  - 0 Assessment of Community assistance (transportation, D/C planning, etc.) '[]'$  - 0 Additional assistance / Altered mentation '[]'$  - 0 Support Surface(s) Assessment (bed, cushion, seat, etc.) INTERVENTIONS - Wound Cleansing / Measurement X - Simple Wound Cleansing - one wound 1 5 '[]'$  - 0 Complex Wound Cleansing - multiple wounds X-  1 5 Wound Imaging (photographs - any number of wounds) '[]'$  - 0 Wound Tracing (instead of photographs) X- 1 5 Simple Wound Measurement - one wound '[]'$  - 0 Complex  Wound Measurement - multiple wounds INTERVENTIONS - Wound Dressings X - Small Wound Dressing one or multiple wounds 1 10 '[]'$  - 0 Medium Wound Dressing one or multiple wounds '[]'$  - 0 Large Wound Dressing one or multiple wounds '[]'$  - 0 Application of Medications - topical '[]'$  - 0 Application of Medications - injection INTERVENTIONS - Miscellaneous '[]'$  - 0 External ear exam '[]'$  - 0 Specimen Collection (cultures, biopsies, blood, body fluids, etc.) '[]'$  - 0 Specimen(s) / Culture(s) sent or taken to Lab for analysis [name], [name] ([id number]) [phone].pdf Page 3 of 10 '[]'$  - 0 Patient Transfer (multiple staff / Civil Service fast streamer / Similar devices) '[]'$  - 0 Simple Staple / Suture removal (25 or less) '[]'$  - 0 Complex Staple / Suture removal (26 or more) '[]'$  - 0 Hypo / Hyperglycemic Management (close monitor of Blood Glucose) '[]'$  - 0 Ankle / Brachial Index (ABI) - do not check if billed separately X- 1 5 Vital Signs Has the patient been seen at the hospital within the last three years: Yes Total Score: 85 Level Of Care: New/Established - Level 3 Electronic Signature(s) Signed: 02/15/2023 5:10:28 PM By: [name] Loud MSN RN CNS WTA Entered By: [name] on 02/15/2023 16:39:01 -------------------------------------------------------------------------------- Encounter Discharge Information Details Patient Name: Date of Service: [place] [name], [name] 02/15/2023 3:15 PM Medical Record Number: [id number] Patient Account Number: [id number] Date of Birth/Sex: Treating RN: [birth year] (14 Rice.o. [name] Primary Care [name]: [name] Other Clinician: Referring Malyia Moro: Treating [name]/Extender: [name], [name] in Treatment: 2 Encounter Discharge Information  Items Discharge Condition: Stable Ambulatory Status: Ambulatory Discharge Destination: Home Transportation: Private Auto Accompanied By: self Schedule Follow-up Appointment: Yes Clinical Summary of Care: Electronic Signature(s) Signed: 02/15/2023 4:40:29 PM By: [name] Loud MSN RN CNS WTA Entered By: [name] on 02/15/2023 16:40:28 -------------------------------------------------------------------------------- Lower Extremity Assessment Details Patient Name: Date of Service: [place] Rice, [place] M. 02/15/2023 3:15 PM Medical Record Number: [id number] Patient Account Number: [id number] Date of Birth/Sex: Treating RN: [name][birth year] (49 Rice.o. [name] Primary Care Alyha Marines: [name] Other Clinician: Ulysees Rice ([id number]) [id number] Page 4 of 10 Referring [name]: Treating Zakariye Nee/Extender: [name], [name] in Treatment: 2 Edema Assessment Assessed: [Left: Yes] [Right: No] Edema: [Left: Ye] [Right: s] Calf Left: Right: Point of Measurement: 29 cm From Medial Instep 34.7 cm Ankle Left: Right: Point of Measurement: 12 cm From Medial Instep 23.6 cm Vascular Assessment Pulses: Dorsalis Pedis Palpable: [Left:Yes] Electronic Signature(s) Signed: 02/15/2023 4:37:37 PM By: [name] Loud MSN RN CNS WTA Entered By: [name] on 02/15/2023 16:37:37 -------------------------------------------------------------------------------- Multi Wound Chart Details Patient Name: Date of Service: [place] Rice, [name] 02/15/2023 3:15 PM Medical Record Number: [id number] Patient Account Number: [id number] Date of Birth/Sex: Treating RN: [birth year] [name] (84 Rice.o. [name] Primary Care Alton Bouknight: [name] Other Clinician: Referring Nakaila Freeze: Treating [name]/Extender: [name], [name] in Treatment: 2 Vital Signs Height(in): 60 Pulse(bpm): 74 Weight(lbs): 145 Blood Pressure(mmHg):  169/97 Body Mass Index(BMI): 28.3 Temperature(F): 98.0 Respiratory Rate(breaths/min): 16 [1:Photos:] [N/A:N/A] Left, Lateral Ankle N/A N/A Wound Location: Not Known N/A N/A Wounding Event: Cellulitis N/A N/A Primary Etiology: Hypertension N/A N/A Comorbid History: 01/19/2023 N/A N/A Date Acquired: 2 N/A N/A Suella [place] of TreatmentGERALDENE, [name] ([id number]) [id number] Page 5 of 10 Open N/A N/A Wound Status: No N/A N/A Wound Recurrence: 1.2x1x1 N/A N/A Measurements L x W x D (cm) 0.942 N/A N/A A (cm) : rea 0.942 N/A  N/A Volume (cm) : 0.00% N/A N/A % Reduction in A rea: -Rice.10% N/A N/A % Reduction in Volume: Unclassifiable N/A N/A Classification: Large N/A N/A Exudate A mount: Purulent N/A N/A Exudate Type: yellow, brown, green N/A N/A Exudate Color: None Present (0%) N/A N/A Granulation A mount: Large (67-100%) N/A N/A Necrotic A mount: Fat Layer (Subcutaneous Tissue): Yes N/A N/A Exposed Structures: Fascia: No Tendon: No Muscle: No Joint: No Bone: No None N/A N/A Epithelialization: Treatment Notes Wound #1 (Ankle) Wound Laterality: Left, Lateral Cleanser Vashe 5.8 (oz) Discharge Instruction: Use vashe 5.8 (oz) as directed Peri-Wound Care Topical Primary Dressing Gauze Discharge Instruction: As directed: dry, moistened with saline Secondary Dressing ABD Pad 5x9 (in/in) Discharge Instruction: Cover with ABD pad Secured With Kerlix Roll Sterile or Non-Sterile 6-ply 4.5x4 (yd/yd) Discharge Instruction: Apply Kerlix as directed Compression Wrap Compression Stockings Add-Ons Electronic Signature(s) Signed: 02/15/2023 4:37:49 PM By: [name] Loud MSN RN CNS WTA Entered By: [name] on 02/15/2023 16:37:49 -------------------------------------------------------------------------------- Multi-Disciplinary Care Plan Details Patient Name: Date of Service: [place], [name] 02/15/2023 3:15 PM Medical Record Number:  [id number] Patient Account Number: [id number] Date of Birth/Sex: Treating RN: [name][birth year] (37 Rice.o. [name] Primary Care Russel Morain: [name] Other Clinician: Referring Kynsli Haapala: Treating Virginie Josten/Extender: [name], [name] in Treatment: 2 [name] ([id number]) [id number] Page 6 of 10 Active Inactive Necrotic Tissue Nursing Diagnoses: Impaired tissue integrity related to necrotic/devitalized tissue Knowledge deficit related to management of necrotic/devitalized tissue Goals: Necrotic/devitalized tissue will be minimized in the wound bed Date Initiated: 02/01/2023 Target Resolution Date: 03/02/2023 Goal Status: Active Patient/caregiver will verbalize understanding of reason and process for debridement of necrotic tissue Date Initiated: 02/01/2023 Target Resolution Date: 03/02/2023 Goal Status: Active Interventions: Assess patient pain level pre-, during and post procedure and prior to discharge Provide education on necrotic tissue and debridement process Treatment Activities: Enzymatic debridement : 02/01/2023 Notes: Orientation to the Wound Care Program Nursing Diagnoses: Knowledge deficit related to the wound healing center program Goals: Patient/caregiver will verbalize understanding of the [facility] Date Initiated: 02/01/2023 Target Resolution Date: 02/15/2023 Goal Status: Active Interventions: Provide education on orientation to the wound center Notes: Wound/Skin Impairment Nursing Diagnoses: Impaired tissue integrity Knowledge deficit related to smoking impact on wound healing Knowledge deficit related to ulceration/compromised skin integrity Goals: Patient will demonstrate a reduced rate of smoking or cessation of smoking Date Initiated: 02/01/2023 Target Resolution Date: 03/02/2023 Goal Status: Active Patient will have a decrease in wound volume by X% from date: (specify in  notes) Date Initiated: 02/01/2023 Target Resolution Date: 03/02/2023 Goal Status: Active Patient/caregiver will verbalize understanding of skin care regimen Date Initiated: 02/01/2023 Target Resolution Date: 02/17/2023 Goal Status: Active Ulcer/skin breakdown will have a volume reduction of 30% by week 4 Date Initiated: 02/01/2023 Target Resolution Date: 03/02/2023 Goal Status: Active Ulcer/skin breakdown will have a volume reduction of 50% by week 8 Date Initiated: 02/01/2023 Target Resolution Date: 04/02/2023 Goal Status: Active Ulcer/skin breakdown will have a volume reduction of 80% by week 12 Date Initiated: 02/01/2023 Target Resolution Date: 05/02/2023 Goal Status: Active Interventions: Assess patient/caregiver ability to obtain necessary supplies Assess patient/caregiver ability to perform ulcer/skin care regimen upon admission and as needed Assess ulceration(s) every visit Provide education on smoking Provide education on ulcer and skin care Treatment Activities: [name], [name] ([id number]) [phone].pdf Page 7 of 10 Skin care regimen initiated : 02/01/2023 Smoking cessation education : 02/01/2023 Notes: Electronic Signature(s) Signed: 02/15/2023 5:10:28 PM By: [name] Loud  MSN RN CNS WTA Entered By: [name] on 02/15/2023 16:06:35 -------------------------------------------------------------------------------- Pain Assessment Details Patient Name: Date of Service: [place] Rice, [place]. 02/15/2023 3:15 PM Medical Record Number: [id number] Patient Account Number: [id number] Date of Birth/Sex: Treating RN: [name], [name] (58 Rice.o. [name] Primary Care Sostenes Kauffmann: [name] Other Clinician: Referring Antolin Belsito: Treating Alyze Lauf/Extender: [name], [name] in Treatment: 2 Active Problems Location of Pain Severity and Description of Pain Patient Has Paino Yes Site Locations Pain Location: Pain in Ulcers With  Dressing Change: Yes Duration of the Pain. Constant / Intermittento Constant Rate the pain. Current Pain Level: 6 Worst Pain Level: 10 Least Pain Level: 4 Tolerable Pain Level: 4 Character of Pain Describe the Pain: Aching, Shooting, Tender, Throbbing Pain Management and Medication Current Pain Management: Electronic Signature(s) Signed: 02/15/2023 4:37:26 PM By: [name] Loud MSN RN CNS WTA Entered By: [name] on 02/15/2023 16:37:26 [name] ([id number]) [phone].pdf Page 8 of 10 -------------------------------------------------------------------------------- Patient/Caregiver Education Details Patient Name: Date of Service: [place] [name] 2/28/2024andnbsp3:15 PM Medical Record Number: [id number] Patient Account Number: [id number] Date of Birth/Gender: Treating RN: [birth year][name] (3 Rice.o. [name] Primary Care Physician: [name] Other Clinician: Referring Physician: Treating Physician/Extender: [name] Rice in Treatment: 2 Education Assessment Education Provided To: Patient Education Topics Provided Wound/Skin Impairment: Handouts: Caring for Your Ulcer Methods: Explain/Verbal Responses: State content correctly Electronic Signature(s) Signed: 02/15/2023 5:10:28 PM By: [name] Loud MSN RN CNS WTA Entered By: [name] on 02/15/2023 16:06:48 -------------------------------------------------------------------------------- Wound Assessment Details Patient Name: Date of Service: [place], [name] 02/15/2023 3:15 PM Medical Record Number: [id number] Patient Account Number: [id number] Date of Birth/Sex: Treating RN: [birth year] (35 Rice.o. [name] Primary Care Abbigale Mcelhaney: [name] Other Clinician: Referring Doral Ventrella: Treating Ralonda Tartt/Extender: [name], [name] in Treatment: 2 Wound Status Wound Number: 1 Primary Etiology: Cellulitis Wound  Location: Left, Lateral Ankle Wound Status: Open Wounding Event: Not Known Comorbid History: Hypertension Date Acquired: 01/19/2023 Rice Of Treatment: 2 Clustered Wound: No Photos [name], [name] ([id number]) [id number] Page 9 of 10 Wound Measurements Length: (cm) 1.2 Width: (cm) 1 Depth: (cm) 1 Area: (cm) 0.942 Volume: (cm) 0.942 % Reduction in Area: 0% % Reduction in Volume: -Rice.1% Epithelialization: None Wound Description Classification: Unclassifiable Exudate Amount: Large Exudate Type: Purulent Exudate Color: yellow, brown, green Foul Odor After Cleansing: No Slough/Fibrino Yes Wound Bed Granulation Amount: None Present (0%) Exposed Structure Necrotic Amount: Large (67-100%) Fascia Exposed: No Necrotic Quality: Adherent Slough Fat Layer (Subcutaneous Tissue) Exposed: Yes Tendon Exposed: No Muscle Exposed: No Joint Exposed: No Bone Exposed: No Treatment Notes Wound #1 (Ankle) Wound Laterality: Left, Lateral Cleanser Vashe 5.8 (oz) Discharge Instruction: Use vashe 5.8 (oz) as directed Peri-Wound Care Topical Primary Dressing Gauze Discharge Instruction: As directed: dry, moistened with saline Secondary Dressing ABD Pad 5x9 (in/in) Discharge Instruction: Cover with ABD pad Secured With Kerlix Roll Sterile or Non-Sterile 6-ply 4.5x4 (yd/yd) Discharge Instruction: Apply Kerlix as directed Compression Wrap Compression Stockings Add-Ons Electronic Signature(s) Signed: 02/15/2023 5:10:28 PM By: [name] Loud MSN RN CNS WTA Entered By: [name] on 02/15/2023 15:54:12 [name] ([id number]) [id number] Page 10 of 10 -------------------------------------------------------------------------------- Vitals Details Patient Name: Date of Service: [place], [place]. 02/15/2023 3:15 PM Medical Record Number: [id number] Patient Account Number: [id number] Date of Birth/Sex: Treating RN: Rice-14-[name] (56  Rice.o. [name] Primary Care [name]: [name] Other Clinician: Referring [name]: Treating  [name]/Extender: [name], [name] in Treatment: 2 Vital Signs Time Taken: 15:43 Temperature (F): 98.0 Height (in): 60 Pulse (bpm): 74 Weight (lbs): 145 Respiratory Rate (breaths/min): 16 Body Mass Index (BMI): 28.3 Blood Pressure (mmHg): 169/97 Reference Range: 80 - 120 mg / dl Electronic Signature(s) Signed: 02/15/2023 4:37:21 PM By: [name] Loud MSN RN CNS WTA Entered By: [name] on 02/15/2023 16:37:21

## 2023-02-18 NOTE — Progress Notes (Signed)
[name], [name] ([id number]) [id number] Page 1 of 6 Visit Report for 02/15/2023 Chief Complaint Document Details Patient Name: Date of Service: [name], [place]. 02/15/2023 3:15 PM Medical Record Number: [id number] Patient Account Number: [id number] Date of Birth/Sex: Treating RN: [birth year] (47 [name].o. [name] Primary Care Provider: Caryl Asp Other Clinician: Referring Provider: Treating Provider/Extender: Alease Medina, [name] Weeks in Treatment: 2 Information Obtained from: Patient Chief Complaint 02/01/2023; Left ankle wound Electronic Signature(s) Signed: 02/15/2023 4:14:12 PM By: Kalman Shan DO Entered By: Kalman Shan on 02/15/2023 16:08:13 -------------------------------------------------------------------------------- HPI Details Patient Name: Date of Service: [name], [place] [name] M. 02/15/2023 3:15 PM Medical Record Number: [id number] Patient Account Number: [id number] Date of Birth/Sex: Treating RN: [birth year] (48 [name].o. [name] Primary Care Provider: Caryl Asp Other Clinician: Referring Provider: Treating Provider/Extender: Alease Medina, [name] Weeks in Treatment: 2 History of Present Illness HPI Description: 02/01/2023 [name] is a 47 year old female with a past medical history of alcohol/drug abuse and hypertension that presents to the clinic for a 50-monthhistory of nonhealing ulcer to the left lateral ankle. She states it started out as cellulitis And subsequently developed a wound to the area. She has tried Keflex, doxycycline and Levaquin for this issue. She reports that the erythema had improved however still has chronic pain to the wound site with increased swelling to the ankle. She has been keeping the area covered. 2/28; patient missed her last clinic appointment. She has been taking doxycycline and Augmentin and states she has a few days left. She had an  x-ray done at last clinic visit that showed potential subtle periosteal reaction to the lateral shaft of the fibula that could be consistent with osteomyelitis. An MRI was ordered and she did obtain this however results have not come through. She has been using Vashe wet-to-dry dressings. Overall her symptoms are stable. Electronic Signature(s) Signed: 02/15/2023 4:14:12 PM By: HKalman ShanDO Entered By: HKalman Shanon 02/15/2023 16:09:59 CUlysees [name]([id number] Page 2 of 6 -------------------------------------------------------------------------------- Physical Exam Details Patient Name: Date of Service: [name] 02/15/2023 3:15 PM Medical Record Number: [id number] Account Number: [id number] of Birth/Sex: Treating RN: 706-18-1977(47y.o. FDrema PryPrimary Care Provider: ICaryl AspOther Clinician: Referring Provider: Treating Provider/Extender: HAlease Medina [name] Weeks in Treatment: 2 Constitutional . Cardiovascular . Psychiatric . Notes Left foot: T the lateral ankle there is an open wound with nonviable tissue throughout. Increased swelling to the ankle. Pain on palpation. No purulent drainage o noted. Electronic Signature(s) Signed: 02/15/2023 4:14:12 PM By: HKalman ShanDO Entered By: HKalman Shanon 02/15/2023 16:10:28 -------------------------------------------------------------------------------- Physician Orders Details Patient Name: Date of Service: [name] RMarathonY, LMaineURA M. 02/15/2023 3:15 PM Medical Record Number: [id number] Account Number: [id number] of Birth/Sex: Treating RN: [birth year](47y.o. FDrema PryPrimary Care Provider: ICaryl AspOther Clinician: Referring Provider: Treating Provider/Extender: HAlease Medina [name] Weeks in Treatment: 2 Verbal / Phone Orders: No Diagnosis Coding Follow-up  Appointments Return Appointment in 1 week. Bathing/ Shower/ Hygiene Clean wound with Normal Saline or wound cleanser. - Vashe Anesthetic (Use 'Patient Medications' Section for Anesthetic Order Entry) Lidocaine applied to wound bed Wound Treatment [name], [name]([id number] Page 3 of 6 Wound #1 - Ankle Wound Laterality: Left, Lateral Cleanser: Vashe 5.8 (oz) 1 x Per Day/30 Days Discharge Instructions: Use vashe 5.8 (oz) as directed Prim Dressing: Gauze 1  x Per Day/30 Days ary Discharge Instructions: As directed: dry, moistened with saline Secondary Dressing: ABD Pad 5x9 (in/in) 1 x Per Day/30 Days Discharge Instructions: Cover with ABD pad Secured With: Kerlix Roll Sterile or Non-Sterile 6-ply 4.5x4 (yd/yd) 1 x Per Day/30 Days Discharge Instructions: Apply Kerlix as directed Radiology MRI with and without Contrast Electronic Signature(s) Signed: 02/15/2023 4:38:56 PM By: Rosalio Loud MSN RN CNS WTA Signed: 02/16/2023 2:28:13 PM By: Kalman Shan DO Previous Signature: 02/15/2023 4:14:12 PM Version By: Kalman Shan DO Entered By: Rosalio Loud on 02/15/2023 16:38:56 -------------------------------------------------------------------------------- Problem List Details Patient Name: Date of Service: [name] [name], [place] [name] M. 02/15/2023 3:15 PM Medical Record Number: [id number] Patient Account Number: [id number] Date of Birth/Sex: Treating RN: Sep 29, 1976 (61 [name].o. [name] Primary Care Provider: Caryl Asp Other Clinician: Referring Provider: Treating Provider/Extender: Alease Medina, [name] Weeks in Treatment: 2 Active Problems ICD-10 Encounter Code Description Active Date MDM Diagnosis L97.528 Non-pressure chronic ulcer of other part of left foot with other specified 02/01/2023 No Yes severity L08.9 Local infection of the skin and subcutaneous tissue, unspecified 02/01/2023 No Yes Inactive Problems Resolved  Problems Electronic Signature(s) Signed: 02/15/2023 4:14:12 PM By: Kalman Shan DO Entered By: Kalman Shan on 02/15/2023 16:08:10 [name] ([id number]) [id number] Page 4 of 6 -------------------------------------------------------------------------------- Progress Note Details Patient Name: Date of Service: [name] RRA [place], [place]. 02/15/2023 3:15 PM Medical Record Number: [id number] Patient Account Number: [id number] Date of Birth/Sex: Treating RN: 12/10/1976 (33 [name].o. [name] Primary Care Provider: Caryl Asp Other Clinician: Referring Provider: Treating Provider/Extender: Alease Medina, [name] Weeks in Treatment: 2 Subjective Chief Complaint Information obtained from Patient 02/01/2023; Left ankle wound History of Present Illness (HPI) 02/01/2023 [name] is a 47 year old female with a past medical history of alcohol/drug abuse and hypertension that presents to the clinic for a 40-monthhistory of nonhealing ulcer to the left lateral ankle. She states it started out as cellulitis And subsequently developed a wound to the area. She has tried Keflex, doxycycline and Levaquin for this issue. She reports that the erythema had improved however still has chronic pain to the wound site with increased swelling to the ankle. She has been keeping the area covered. 2/28; patient missed her last clinic appointment. She has been taking doxycycline and Augmentin and states she has a few days left. She had an x-ray done at last clinic visit that showed potential subtle periosteal reaction to the lateral shaft of the fibula that could be consistent with osteomyelitis. An MRI was ordered and she did obtain this however results have not come through. She has been using Vashe wet-to-dry dressings. Overall her symptoms are stable. Objective Constitutional Vitals Time Taken: 3:43 PM, Height: 60 in, Weight: 145 lbs, BMI: 28.3,  Temperature: 98.0 F, Pulse: 74 bpm, Respiratory Rate: 16 breaths/min, Blood Pressure: 169/97 mmHg. General Notes: Left foot: T the lateral ankle there is an open wound with nonviable tissue throughout. Increased swelling to the ankle. Pain on palpation. No o purulent drainage noted. Integumentary (Hair, Skin) Wound #1 status is Open. Original cause of wound was Not Known. The date acquired was: 01/19/2023. The wound has been in treatment 2 weeks. The wound is located on the Left,Lateral Ankle. The wound measures 1.2cm length x 1cm width x 1cm depth; 0.942cm^2 area and 0.942cm^3 volume. There is Fat Layer (Subcutaneous Tissue) exposed. There is a large amount of purulent drainage noted. There is no granulation within the wound bed. There is  a large (67-100%) amount of necrotic tissue within the wound bed including Adherent Slough. Assessment Active Problems ICD-10 Non-pressure chronic ulcer of other part of left foot with other specified severity Local infection of the skin and subcutaneous tissue, unspecified Patient's wound is stable. There is no longer increased warmth and erythema to the surrounding soft tissue. However there is still increased swelling with pain on palpation. I recommended she finish her oral antibiotics and continue Vashe wet-to-dry dressings. We are still awaiting the results of the MRI. Follow-up in 1 week. [name], [name] ([id number]) [id number] Page 5 of 6 Plan Follow-up Appointments: Return Appointment in 1 week. Bathing/ Shower/ Hygiene: Clean wound with Normal Saline or wound cleanser. - Vashe Anesthetic (Use 'Patient Medications' Section for Anesthetic Order Entry): Lidocaine applied to wound bed WOUND #1: - Ankle Wound Laterality: Left, Lateral Cleanser: Vashe 5.8 (oz) 1 x Per Day/30 Days Discharge Instructions: Use vashe 5.8 (oz) as directed Prim Dressing: Gauze 1 x Per Day/30 Days ary Discharge Instructions: As directed:  dry, moistened with saline Secondary Dressing: ABD Pad 5x9 (in/in) 1 x Per Day/30 Days Discharge Instructions: Cover with ABD pad Secured With: Kerlix Roll Sterile or Non-Sterile 6-ply 4.5x4 (yd/yd) 1 x Per Day/30 Days Discharge Instructions: Apply Kerlix as directed 1. Vashe wet-to-dry dressings 2. Aggressive offloading 3. Follow-up in 1 week Electronic Signature(s) Signed: 02/15/2023 4:14:12 PM By: Kalman Shan DO Entered By: Kalman Shan on 02/15/2023 16:11:21 -------------------------------------------------------------------------------- ROS/PFSH Details Patient Name: Date of Service: [name] [place] [name], [place] [name] M. 02/15/2023 3:15 PM Medical Record Number: [id number] Patient Account Number: [id number] Date of Birth/Sex: Treating RN: Apr 19, 1976 (66 [name].o. [name] Primary Care Provider: Caryl Asp Other Clinician: Referring Provider: Treating Provider/Extender: Alease Medina, [name] Weeks in Treatment: 2 Information Obtained From Patient Eyes Medical History: Negative for: Cataracts; Glaucoma; Optic Neuritis Cardiovascular Medical History: Positive for: Hypertension Psychiatric Medical History: Negative for: Anorexia/bulimia; Confinement Anxiety Immunizations Pneumococcal Vaccine: Received Pneumococcal Vaccination: No Implantable Devices None [name], [name] ([id number]) [id number] Page 6 of 6 Family and Social History Current every day smoker - 5 cig/day; Marital Status - Divorced; Alcohol Use: Moderate - couple times week; Drug Use: No History; Caffeine Use: Rarely Electronic Signature(s) Signed: 02/15/2023 4:14:12 PM By: Kalman Shan DO Signed: 02/15/2023 5:10:28 PM By: Rosalio Loud MSN RN CNS WTA Entered By: Kalman Shan on 02/15/2023 16:11:46 -------------------------------------------------------------------------------- SuperBill Details Patient Name: Date of Service: [name] [place], [place].  02/15/2023 Medical Record Number: [id number] Patient Account Number: [id number] Date of Birth/Sex: Treating RN: 1976/05/24 (74 [name].o. [name] Primary Care Provider: Caryl Asp Other Clinician: Referring Provider: Treating Provider/Extender: Alease Medina, [name] Weeks in Treatment: 2 Diagnosis Coding ICD-10 Codes Code Description [phone] Non-pressure chronic ulcer of other part of left foot with other specified severity L08.9 Local infection of the skin and subcutaneous tissue, unspecified Facility Procedures : CPT4 Code: [id number] Description: 99213 - WOUND CARE VISIT-LEV 3 EST PT Modifier: Quantity: 1 Physician Procedures : CPT4 Code Description Modifier [id number] - WC PHYS LEVEL 3 - EST PT ICD-10 Diagnosis Description L97.528 Non-pressure chronic ulcer of other part of left foot with other specified severity L08.9 Local infection of the skin and subcutaneous tissue,  unspecified Quantity: 1 Electronic Signature(s) Signed: 02/15/2023 4:14:12 PM By: Kalman Shan DO Entered By: Kalman Shan on 02/15/2023 16:11:32

## 2023-02-21 ENCOUNTER — Ambulatory Visit: Payer: Self-pay | Admitting: Gerontology

## 2023-02-22 ENCOUNTER — Encounter: Payer: Medicaid Other | Attending: Internal Medicine | Admitting: Internal Medicine

## 2023-02-22 DIAGNOSIS — G8929 Other chronic pain: Secondary | ICD-10-CM | POA: Insufficient documentation

## 2023-02-22 DIAGNOSIS — L97528 Non-pressure chronic ulcer of other part of left foot with other specified severity: Secondary | ICD-10-CM

## 2023-02-22 DIAGNOSIS — I10 Essential (primary) hypertension: Secondary | ICD-10-CM | POA: Insufficient documentation

## 2023-02-22 DIAGNOSIS — L089 Local infection of the skin and subcutaneous tissue, unspecified: Secondary | ICD-10-CM

## 2023-02-23 NOTE — Progress Notes (Signed)
KIERNAN, [name] ([id number]) [id number] Page 1 of 7 Visit Report for 02/22/2023 Chief Complaint Document Details Patient Name: Date of Service: [place] RRA [name], [place]. 02/22/2023 9:30 A M Medical Record Number: [id number] Patient Account Number: [id number] Date of Birth/Sex: Treating RN: 09-27-1976 (47 y.o. [name] Primary Care Provider: Caryl Asp Other Clinician: Referring Provider: Treating Provider/Extender: Alease Medina, [name] Weeks in Treatment: 3 Information Obtained from: Patient Chief Complaint 02/01/2023; Left ankle wound Electronic Signature(s) Signed: 02/22/2023 1:18:38 PM By: Kalman Shan DO Entered By: Kalman Shan on 02/22/2023 11:01:23 -------------------------------------------------------------------------------- HPI Details Patient Name: Date of Service: [place] RRA [place], [name] 02/22/2023 9:30 A M Medical Record Number: [id number] Patient Account Number: [id number] Date of Birth/Sex: Treating RN: 24-Mar-1976 (47 y.o. [name] Primary Care Provider: Caryl Asp Other Clinician: Referring Provider: Treating Provider/Extender: Alease Medina, [name] Weeks in Treatment: 3 History of Present Illness HPI Description: 02/01/2023 [name] is a 47 year old female with a past medical history of alcohol/drug abuse and hypertension that presents to the clinic for a 56-monthhistory of nonhealing ulcer to the left lateral ankle. She states it started out as cellulitis And subsequently developed a wound to the area. She has tried Keflex, doxycycline and Levaquin for this issue. She reports that the erythema had improved however still has chronic pain to the wound site with increased swelling to the ankle. She has been keeping the area covered. 2/28; patient missed her last clinic appointment. She has been taking doxycycline and Augmentin and states she has a few days left. She had an  x-ray done at last clinic visit that showed potential subtle periosteal reaction to the lateral shaft of the fibula that could be consistent with osteomyelitis. An MRI was ordered and she did obtain this however results have not come through. She has been using Vashe wet-to-dry dressings. Overall her symptoms are stable. 3/6; patient presents for follow-up. She had an MRI completed of her left ankle. Results were not conclusive. It noted trace subcortical marrow edema within the distal lateral fibular metaphyseal/epiphyseal region with possible minimal cortical thinning. This may be reactive and noninfected however cannot exclude very early osteomyelitis. No tendon tear or abscess noted . She has been using Vashe wet-to-dry dressings. Electronic Signature(s) CMEIKO, DEFELICE([id number] Page 2 of 7 Signed: 02/22/2023 1:18:38 PM By: HKalman ShanDO Entered By: HKalman Shanon 02/22/2023 11:11:56 -------------------------------------------------------------------------------- Physical Exam Details Patient Name: Date of Service: [place] 02/22/2023 9:30 A M Medical Record Number: [id number] Account Number: [id number] of Birth/Sex: Treating RN: [birth year](47y.o. FDrema PryPrimary Care Provider: ICaryl AspOther Clinician: Referring Provider: Treating Provider/Extender: HAlease Medina [name] Weeks in Treatment: 3 Constitutional . Cardiovascular . Psychiatric . Notes Left foot: T the lateral ankle there is an open wound with nonviable tissue throughout. Increased swelling to the ankle. Pain on palpation. No purulent drainage o noted. Electronic Signature(s) Signed: 02/22/2023 1:18:38 PM By: HKalman ShanDO Entered By: HKalman Shanon 02/22/2023 11:12:17 -------------------------------------------------------------------------------- Physician Orders Details Patient Name: Date of  Service: [place] M. 02/22/2023 9:30 A M Medical Record Number: [id number] Account Number: [id number] of Birth/Sex: Treating RN: [birth year](47y.o. FDrema PryPrimary Care Provider: ICaryl AspOther Clinician: Referring Provider: Treating Provider/Extender: HAlease Medina [name] Weeks in Treatment: 3 Verbal / Phone Orders: No Diagnosis Coding Follow-up Appointments Return Appointment in 1 week. Bathing/  Shower/ Hygiene Clean wound with Normal Saline or wound cleanser. HONESTIE, SOUVA ([id number]) [id number] Page 3 of 7 Anesthetic (Use 'Patient Medications' Section for Anesthetic Order Entry) Lidocaine applied to wound bed Wound Treatment Wound #1 - Ankle Wound Laterality: Left, Lateral Cleanser: Vashe 5.8 (oz) 1 x Per Day/30 Days Discharge Instructions: Use vashe 5.8 (oz) as directed Prim Dressing: Gauze 1 x Per Day/30 Days ary Discharge Instructions: As directed: dry, moistened with saline Secondary Dressing: ABD Pad 5x9 (in/in) 1 x Per Day/30 Days Discharge Instructions: Cover with ABD pad Secured With: Kerlix Roll Sterile or Non-Sterile 6-ply 4.5x4 (yd/yd) 1 x Per Day/30 Days Discharge Instructions: Apply Kerlix as directed Laboratory C reactive protein [Mass/volume] in Serum or Plasma (CHEM) LOINC Code: 1988-5 Convenience Name: C Reactive Protein in serum or plasma CBC W A Differential panel in Blood (HEM-CBC) uto LOINC Code: [id number] Convenience Name: CBC W Auto Differential panel Erythrocyte sedimentation rate (HEM) LOINC Code: [id number] Convenience Name: Sed rate-method unspecified Patient Medications llergies: Mapap (acetaminophen) A Notifications Medication Indication Start End 02/22/2023 levofloxacin DOSE 1 - oral 750 mg tablet - 1 tablet oral once daily x 10 days Electronic Signature(s) Signed: 02/22/2023 1:18:38 PM By: Kalman Shan DO Previous Signature: 02/22/2023 11:19:13  AM Version By: Kalman Shan DO Previous Signature: 02/22/2023 10:35:09 AM Version By: Carlene Coria RN Entered By: Kalman Shan on 02/22/2023 11:19:23 Prescription 02/22/2023 -------------------------------------------------------------------------------- [name]. Kalman Shan MD Patient Name: Provider: 17-Nov-1976 [id number] Date of Birth: NPI#: F Sex: DEA #: [phone] Phone #: License #: [place] Patient Address: [facility] [place], [place] [address] [address] [place], [place] [address] [phone] LIKITA, BRESSON ([id number]) [id number] Page 4 of 7 Allergies Mapap (acetaminophen) Provider's Orders C reactive protein [Mass/volume] in Serum or Plasma LOINC Code: 1988-5 Convenience Name: C Reactive Protein in serum or plasma Hand Signature: Date(s): Prescription 02/22/2023 [name]. Kalman Shan MD Patient Name: Provider: Jan 05, 1976 [id number] Date of Birth: NPI#: F Sex: DEA #: [phone] Phone #: License #: [place] Patient Address: [facility] [place], [place] [address] [address], [place], [place] [address] [phone] Allergies Mapap (acetaminophen) Provider's Orders CBC W A Differential panel in Blood uto LOINC Code: [phone] Convenience Name: CBC W Auto Differential panel Hand Signature: Date(s): Prescription 02/22/2023 [name]. Kalman Shan MD Patient Name: Provider: July 13, 1976 [id number] Date of Birth: NPI#: F Sex: DEA #: [phone] Phone #: License #: [place] and [place] Patient Address: [facility] [place], [place] [address] [address] [place], [place] [address] [phone] Allergies Mapap  (acetaminophen) Provider's Orders Erythrocyte sedimentation rate LOINC Code: [id number] Convenience Name: Sed rate-method unspecified Hand Signature: Date(s): Electronic Signature(s) Signed: 02/22/2023 1:18:38 PM By: Kalman Shan DO Entered By: Kalman Shan on 02/22/2023 11:19:23 [name] ([id number]) [id number] Page 5 of 7 -------------------------------------------------------------------------------- Problem List Details Patient Name: Date of Service: [place] RRA [place], [place]. 02/22/2023 9:30 A M Medical Record Number: [id number] Patient Account Number: [id number] Date of Birth/Sex: Treating RN: 05/25/1976 (47 y.o. 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 02/22/2023 9:30 A M Medical Record Number: [id number] Patient Account Number: [id number] Date of Birth/Sex: Treating RN: [birth year] (47 y.o. [name] Primary Care Provider: Caryl Asp Other Clinician: Referring Provider: Treating Provider/Extender: Alease Medina, [name] Weeks in Treatment: 3 Subjective Chief Complaint Information obtained from Patient 02/01/2023; Left ankle wound History of Present Illness (HPI) MEY, SULAK  ([id number]) [id number] Page 6 of 7 02/01/2023 [name] is a 47 year old female with a past medical history of alcohol/drug abuse and hypertension that presents to the clinic for a 54-monthhistory of nonhealing ulcer to the left lateral ankle. She states it started out as cellulitis And subsequently developed a wound to the area. She has tried Keflex, doxycycline and Levaquin for this issue. She reports that the erythema had improved however still has chronic pain to the wound site with increased swelling to the ankle. She has been keeping the area covered. 2/28; patient missed her last clinic appointment. She has been taking doxycycline and Augmentin and states she has a few days left. She had an x-ray done at last clinic visit that showed potential subtle periosteal reaction to the lateral shaft of the fibula that could be consistent with osteomyelitis. An MRI was ordered and she did obtain this however results have not come through. She has been using Vashe wet-to-dry dressings. Overall her symptoms are stable. 3/6; patient presents for follow-up. She had an MRI completed of her left ankle. Results were not conclusive. It noted trace subcortical marrow edema within the distal lateral fibular metaphyseal/epiphyseal region with possible minimal cortical thinning. This may be reactive and noninfected however cannot exclude very early osteomyelitis. No tendon tear or abscess noted . She has been using Vashe wet-to-dry dressings. Objective Constitutional Vitals Time Taken: 9:53 AM, Height: 60 in, Weight: 145 lbs, BMI: 28.3, Temperature: 97.9 F, Pulse: 67 bpm, Respiratory Rate: 16 breaths/min, Blood Pressure: 159/98 mmHg. General Notes: Left foot: T the lateral ankle there is an open wound with nonviable tissue throughout. Increased swelling to the ankle. Pain on palpation. No o purulent drainage noted. Integumentary (Hair, Skin) Wound #1 status is Open.  Original cause of wound was Not Known. The date acquired was: 01/19/2023. The wound has been in treatment 3 weeks. The wound is located on the Left,Lateral Ankle. The wound measures 0.5cm length x 0.6cm width x 1.7cm depth; 0.236cm^2 area and 0.401cm^3 volume. There is Fat Layer (Subcutaneous Tissue) exposed. There is a large amount of purulent drainage noted. There is no granulation within the wound bed. There is a large (67-100%) amount of necrotic tissue within the wound bed including Adherent Slough. Assessment Active Problems ICD-10 Non-pressure chronic ulcer of other part of left foot with other specified severity Local infection of the skin and subcutaneous tissue, unspecified Patient still has increased swelling to the left lateral ankle. MRI did not have any definitive findings. This is still very concerning for osteomyelitis based on physical exam. Patient has pain on exam and I cannot fully assess the depth. At this time I will continue oral antibiotics. I will go ahead and obtain a CBC , CRP and sed rate. If inflammatory markers elevated will refer to infectious disease to discuss antibiotic course for probable  osteomyelitis. If inflammatory markers not elevated may need to refer to orthopedic surgery for further evaluation. For now continue Vashe wet-to-dry dressings. Plan Follow-up Appointments: Return Appointment in 1 week. Bathing/ Shower/ Hygiene: Clean wound with Normal Saline or wound cleanser. - Vashe Anesthetic (Use 'Patient Medications' Section for Anesthetic Order Entry): Lidocaine applied to wound bed Laboratory ordered were: C Reactive Protein in serum or plasma, CBC W Auto Differential panel, Sed rate -method unspecified WOUND #1: - Ankle Wound Laterality: Left, Lateral Cleanser: Vashe 5.8 (oz) 1 x Per Day/30 Days Discharge Instructions: Use vashe 5.8 (oz) as directed Prim Dressing: Gauze 1 x Per Day/30 Days ary Discharge Instructions: As directed: dry, moistened  with saline Secondary Dressing: ABD Pad 5x9 (in/in) 1 x Per Day/30 Days Discharge Instructions: Cover with ABD pad Secured With: Kerlix Roll Sterile or Non-Sterile 6-ply 4.5x4 (yd/yd) 1 x Per Day/30 Days Discharge Instructions: Apply Kerlix as directed 1. Vashe wet-to-dry dressings 2. CBC, CRP and sed rate 3. Follow-up in 1 week 4. Levofloxacin LEAN, LOOSLI ([id number]) [id number] Page 7 of 7 Electronic Signature(s) Signed: 02/22/2023 1:18:38 PM By: Kalman Shan DO Entered By: Kalman Shan on 02/22/2023 11:16:33 -------------------------------------------------------------------------------- SuperBill Details Patient Name: Date of Service: [place] RRA [place], [name] 02/22/2023 Medical Record Number: [id number] Patient Account Number: [id number] Date of Birth/Sex: Treating RN: January 08, 1976 (47 y.o. [name] Primary Care Provider: Caryl Asp Other Clinician: Referring Provider: Treating Provider/Extender: Alease Medina, [name] Weeks in Treatment: 3 Diagnosis Coding ICD-10 Codes Code Description [phone] Non-pressure chronic ulcer of other part of left foot with other specified severity L08.9 Local infection of the skin and subcutaneous tissue, unspecified Facility Procedures : CPT4 Code: [id number] Description: 99213 - WOUND CARE VISIT-LEV 3 EST PT Modifier: Quantity: 1 Physician Procedures : CPT4 Code Description Modifier [id number] - WC PHYS LEVEL 4 - EST PT ICD-10 Diagnosis Description L97.528 Non-pressure chronic ulcer of other part of left foot with other specified severity L08.9 Local infection of the skin and subcutaneous tissue,  unspecified Quantity: 1 Electronic Signature(s) Signed: 02/22/2023 1:18:38 PM By: Kalman Shan DO Entered By: Kalman Shan on 02/22/2023 11:16:39

## 2023-02-24 NOTE — Progress Notes (Signed)
[name] ([id number]) [id number] Page 1 of 9 Visit Report for 02/22/2023 Arrival Information Details Patient Name: Date of Service: [place] RRA [name], [place]. 02/22/2023 9:30 A M Medical Record Number: [id number] Patient Account Number: [id number] Date of Birth/Sex: Treating RN: [name] (47 y.o. [name] Primary Care [name]: [name] Other Clinician: Referring [name]: Treating [name]/Extender: [name], [name] in Treatment: 3 Visit Information History Since Last Visit Added or deleted any medications: No Patient Arrived: Ambulatory Has Dressing in Place as Prescribed: Yes Arrival Time: 09:45 Pain Present Now: Yes Accompanied By: self Transfer Assistance: None Patient Identification Verified: Yes Secondary Verification Process Completed: Yes Patient Requires Transmission-Based Precautions: No Patient Has Alerts: Yes Patient Alerts: NOT DIABETIC Electronic Signature(s) Signed: 02/22/2023 4:47:39 PM By: [name] Loud MSN RN CNS WTA Entered By: [name] on 02/22/2023 10:29:50 -------------------------------------------------------------------------------- Clinic Level of Care Assessment Details Patient Name: Date of Service: CA RRA [place], [place] M. 02/22/2023 9:30 A M Medical Record Number: [id number] Patient Account Number: [id number] Date of Birth/Sex: Treating RN: [name] (47 y.o. [name] Primary Care [name]: [name] Other Clinician: Referring [name]: Treating [name]/Extender: [name], [name] in Treatment: 3 Clinic Level of Care Assessment Items TOOL 4 Quantity Score X- 1 0 Use when only an EandM is performed on FOLLOW-UP visit ASSESSMENTS - Nursing Assessment / Reassessment X- 1 10 Reassessment of Co-morbidities (includes updates in patient status) X- 1 5 Reassessment of Adherence to Treatment Plan ASSESSMENTS - Wound and Skin A ssessment /  Reassessment X - Simple Wound Assessment / Reassessment - one wound 1 5 [name] ([id number]) [id number] Page 2 of 9 '[]'$  - 0 Complex Wound Assessment / Reassessment - multiple wounds '[]'$  - 0 Dermatologic / Skin Assessment (not related to wound area) ASSESSMENTS - Focused Assessment '[]'$  - 0 Circumferential Edema Measurements - multi extremities '[]'$  - 0 Nutritional Assessment / Counseling / Intervention '[]'$  - 0 Lower Extremity Assessment (monofilament, tuning fork, pulses) '[]'$  - 0 Peripheral Arterial Disease Assessment (using hand held doppler) ASSESSMENTS - Ostomy and/or Continence Assessment and Care '[]'$  - 0 Incontinence Assessment and Management '[]'$  - 0 Ostomy Care Assessment and Management (repouching, etc.) PROCESS - Coordination of Care X - Simple Patient / Family Education for ongoing care 1 15 '[]'$  - 0 Complex (extensive) Patient / Family Education for ongoing care X- 1 10 Staff obtains Programmer, systems, Records, T Results / Process Orders est '[]'$  - 0 Staff telephones HHA, Nursing Homes / Clarify orders / etc '[]'$  - 0 Routine Transfer to another Facility (non-emergent condition) '[]'$  - 0 Routine Hospital Admission (non-emergent condition) '[]'$  - 0 New Admissions / Biomedical engineer / Ordering NPWT Apligraf, etc. , '[]'$  - 0 Emergency Hospital Admission (emergent condition) X- 1 10 Simple Discharge Coordination '[]'$  - 0 Complex (extensive) Discharge Coordination PROCESS - Special Needs '[]'$  - 0 Pediatric / Minor Patient Management '[]'$  - 0 Isolation Patient Management '[]'$  - 0 Hearing / Language / Visual special needs '[]'$  - 0 Assessment of Community assistance (transportation, D/C planning, etc.) '[]'$  - 0 Additional assistance / Altered mentation '[]'$  - 0 Support Surface(s) Assessment (bed, cushion, seat, etc.) INTERVENTIONS - Wound Cleansing / Measurement X - Simple Wound Cleansing - one wound 1 5 '[]'$  - 0 Complex Wound Cleansing - multiple wounds X- 1  5 Wound Imaging (photographs - any number of wounds) '[]'$  - 0 Wound Tracing (instead of photographs) X- 1 5 Simple Wound Measurement - one wound '[]'$  -  0 Complex Wound Measurement - multiple wounds INTERVENTIONS - Wound Dressings X - Small Wound Dressing one or multiple wounds 1 10 '[]'$  - 0 Medium Wound Dressing one or multiple wounds '[]'$  - 0 Large Wound Dressing one or multiple wounds '[]'$  - 0 Application of Medications - topical '[]'$  - 0 Application of Medications - injection INTERVENTIONS - Miscellaneous '[]'$  - 0 External ear exam '[]'$  - 0 Specimen Collection (cultures, biopsies, blood, body fluids, etc.) '[]'$  - 0 Specimen(s) / Culture(s) sent or taken to Lab for analysis [name] ([id number]) [phone] Page 3 of 9 '[]'$  - 0 Patient Transfer (multiple staff / Civil Service fast streamer / Similar devices) '[]'$  - 0 Simple Staple / Suture removal (25 or less) '[]'$  - 0 Complex Staple / Suture removal (26 or more) '[]'$  - 0 Hypo / Hyperglycemic Management (close monitor of Blood Glucose) '[]'$  - 0 Ankle / Brachial Index (ABI) - do not check if billed separately X- 1 5 Vital Signs Has the patient been seen at the hospital within the last three years: Yes Total Score: 85 Level Of Care: New/Established - Level 3 Electronic Signature(s) Signed: 02/22/2023 4:47:39 PM By: [name] Loud MSN RN CNS WTA Entered By: [name] on 02/22/2023 10:32:11 -------------------------------------------------------------------------------- Encounter Discharge Information Details Patient Name: Date of Service: CA RRA [place], [place] [name] M. 02/22/2023 9:30 A M Medical Record Number: [id number] Patient Account Number: [id number] Date of Birth/Sex: Treating RN: [name] (47 y.o. [name] Primary Care [name]: [name] Other Clinician: Referring [name]: Treating [name]/Extender: [name], [name] in Treatment: 3 Encounter Discharge Information Items Discharge  Condition: Stable Ambulatory Status: Ambulatory Discharge Destination: Home Transportation: Private Auto Accompanied By: self Schedule Follow-up Appointment: Yes Clinical Summary of Care: Electronic Signature(s) Signed: 02/22/2023 4:47:39 PM By: [name] Loud MSN RN CNS WTA Entered By: [name] on 02/22/2023 10:33:39 -------------------------------------------------------------------------------- Lower Extremity Assessment Details Patient Name: Date of Service: [place] RRA [place], [place] [name] M. 02/22/2023 9:30 A M Medical Record Number: [id number] Patient Account Number: [id number] Date of Birth/Sex: Treating RN: [birth year] (47 y.o. [name] Primary Care Kaelynn Igo: [name] Other Clinician: Ulysees Rice ([id number]) [id number] Page 4 of 9 Referring Theodis Kinsel: Treating Clarion Mooneyhan/Extender: [name], [name] in Treatment: 3 Edema Assessment Assessed: [Left: Yes] [Right: No] [Left: Edema] [Right: :] Calf Left: Right: Point of Measurement: 29 cm From Medial Instep 37 cm Ankle Left: Right: Point of Measurement: 12 cm From Medial Instep 2.36 cm Vascular Assessment Pulses: Dorsalis Pedis Palpable: [Left:Yes] Electronic Signature(s) Signed: 02/22/2023 4:47:39 PM By: [name] Loud MSN RN CNS WTA Entered By: [name] on 02/22/2023 10:30:15 -------------------------------------------------------------------------------- Multi Wound Chart Details Patient Name: Date of Service: [place] RRA [place], [place] [name] M. 02/22/2023 9:30 A M Medical Record Number: [id number] Patient Account Number: [id number] Date of Birth/Sex: Treating RN: [birth year] (47 y.o. [name] Primary Care Adalyne Lovick: [name] Other Clinician: Referring Chloe Bluett: Treating Tung Pustejovsky/Extender: [name], [name] in Treatment: 3 Vital Signs Height(in): 60 Pulse(bpm): 67 Weight(lbs): 145 Blood Pressure(mmHg): 159/98 Body Mass Index(BMI):  28.3 Temperature(F): 97.9 Respiratory Rate(breaths/min): 16 [1:Photos:] [N/A:N/A] Left, Lateral Ankle N/A N/A Wound Location: Not Known N/A N/A Wounding Event: Cellulitis N/A N/A Primary Etiology: Hypertension N/A N/A Comorbid History: 01/19/2023 N/A N/A Date Acquired: 3 N/A N/A [name] of TreatmentSHAVONNA, [name] ([id number]) [id number] Page 5 of 9 Open N/A N/A Wound Status: No N/A N/A Wound Recurrence: 0.5x0.6x1.7 N/A N/A Measurements L x W x D (cm) 0.236 N/A N/A A (cm) :  rea 0.401 N/A N/A Volume (cm) : 74.90% N/A N/A % Reduction in A rea: 52.70% N/A N/A % Reduction in Volume: Unclassifiable N/A N/A Classification: Large N/A N/A Exudate A mount: Purulent N/A N/A Exudate Type: yellow, brown, green N/A N/A Exudate Color: None Present (0%) N/A N/A Granulation A mount: Large (67-100%) N/A N/A Necrotic A mount: Fat Layer (Subcutaneous Tissue): Yes N/A N/A Exposed Structures: Fascia: No Tendon: No Muscle: No Joint: No Bone: No None N/A N/A Epithelialization: Treatment Notes Electronic Signature(s) Signed: 02/22/2023 4:47:39 PM By: [name] Loud MSN RN CNS WTA Entered By: [name] on 02/22/2023 10:30:18 -------------------------------------------------------------------------------- Multi-Disciplinary Care Plan Details Patient Name: Date of Service: CA RRA [place], [place] [name] M. 02/22/2023 9:30 A M Medical Record Number: [id number] Patient Account Number: [id number] Date of Birth/Sex: Treating RN: [birth year] (47 y.o. [name] Primary Care Elvina Bosch: [name] Other Clinician: Referring Anabella Capshaw: Treating Alishba Naples/Extender: [name], [name] in Treatment: 3 Active Inactive Necrotic Tissue Nursing Diagnoses: Impaired tissue integrity related to necrotic/devitalized tissue Knowledge deficit related to management of necrotic/devitalized tissue Goals: Necrotic/devitalized tissue will be  minimized in the wound bed Date Initiated: 02/01/2023 Target Resolution Date: 03/02/2023 Goal Status: Active Patient/caregiver will verbalize understanding of reason and process for debridement of necrotic tissue Date Initiated: 02/01/2023 Target Resolution Date: 03/02/2023 Goal Status: Active Interventions: Assess patient pain level pre-, during and post procedure and prior to discharge Provide education on necrotic tissue and debridement process Treatment Activities: Enzymatic debridement : 02/01/2023 Notes: [name], Rice ([id number]) [phone].pdf Page 6 of 9 Orientation to the Wound Care Program Nursing Diagnoses: Knowledge deficit related to the wound healing center program Goals: Patient/caregiver will verbalize understanding of the [place] Date Initiated: 02/01/2023 Target Resolution Date: 02/15/2023 Goal Status: Active Interventions: Provide education on orientation to the wound center Notes: Wound/Skin Impairment Nursing Diagnoses: Impaired tissue integrity Knowledge deficit related to smoking impact on wound healing Knowledge deficit related to ulceration/compromised skin integrity Goals: Patient will demonstrate a reduced rate of smoking or cessation of smoking Date Initiated: 02/01/2023 Target Resolution Date: 03/02/2023 Goal Status: Active Patient will have a decrease in wound volume by X% from date: (specify in notes) Date Initiated: 02/01/2023 Target Resolution Date: 03/02/2023 Goal Status: Active Patient/caregiver will verbalize understanding of skin care regimen Date Initiated: 02/01/2023 Target Resolution Date: 02/17/2023 Goal Status: Active Ulcer/skin breakdown will have a volume reduction of 30% by week 4 Date Initiated: 02/01/2023 Target Resolution Date: 03/02/2023 Goal Status: Active Ulcer/skin breakdown will have a volume reduction of 50% by week 8 Date Initiated: 02/01/2023 Target Resolution Date: 04/02/2023 Goal  Status: Active Ulcer/skin breakdown will have a volume reduction of 80% by week 12 Date Initiated: 02/01/2023 Target Resolution Date: 05/02/2023 Goal Status: Active Interventions: Assess patient/caregiver ability to obtain necessary supplies Assess patient/caregiver ability to perform ulcer/skin care regimen upon admission and as needed Assess ulceration(s) every visit Provide education on smoking Provide education on ulcer and skin care Treatment Activities: Skin care regimen initiated : 02/01/2023 Smoking cessation education : 02/01/2023 Notes: Electronic Signature(s) Signed: 02/22/2023 4:47:39 PM By: [name] Loud MSN RN CNS WTA Entered By: [name] on 02/22/2023 10:32:20 -------------------------------------------------------------------------------- Pain Assessment Details Patient Name: Date of Service: CA RRA [place], [place] [name] M. 02/22/2023 9:30 A [name] ([id number].pdf Page 7 of 9 Medical Record Number: [id number] Patient Account Number: [id number] Date of Birth/Sex: Treating RN: [birth year] (47 y.o. [name] Primary Care Jayleen Scaglione: [name] Other Clinician: Referring Chayton Murata: Treating Abigal Choung/Extender: Heber Woodburn,  Lavone Neri, [name] in Treatment: 3 Active Problems Location of Pain Severity and Description of Pain Patient Has Paino Yes Site Locations With Dressing Change: Yes Duration of the Pain. Constant / Intermittento Constant Rate the pain. Current Pain Level: 6 Worst Pain Level: 9 Least Pain Level: 3 Tolerable Pain Level: 3 Character of Pain Describe the Pain: Shooting, Tender, Throbbing Pain Management and Medication Current Pain Management: Electronic Signature(s) Signed: 02/22/2023 4:47:39 PM By: [name] Loud MSN RN CNS WTA Entered By: [name] on 02/22/2023 10:30:08 -------------------------------------------------------------------------------- Patient/Caregiver Education  Details Patient Name: Date of Service: [place] RRA Massie Kluver 3/6/2024andnbsp9:30 A M Medical Record Number: [id number] Patient Account Number: [id number] Date of Birth/Gender: Treating RN: [birth year] (47 y.o. [name] Primary Care Physician: [name] Other Clinician: Referring Physician: Treating Physician/Extender: Syliva Overman Rice in Treatment: 3 Education Assessment Education Provided To: Patient Education Topics Provided Wound/Skin Impairment: Handouts: Caring for Your Ulcer Methods: Explain/Verbal Responses: State content correctly [name] ([id number]) [phone].pdf Page 8 of 9 Electronic Signature(s) Signed: 02/22/2023 4:47:39 PM By: [name] Loud MSN RN CNS WTA Entered By: [name] on 02/22/2023 10:32:31 -------------------------------------------------------------------------------- Wound Assessment Details Patient Name: Date of Service: CA RRA [place], [place] [name] M. 02/22/2023 9:30 A M Medical Record Number: [id number] Patient Account Number: [id number] Date of Birth/Sex: Treating RN: [birth year] (47 y.o. [name] Primary Care Corrie Reder: [name] Other Clinician: Referring Adelyna Brockman: Treating Patrisha Hausmann/Extender: [name], [name] in Treatment: 3 Wound Status Wound Number: 1 Primary Etiology: Cellulitis Wound Location: Left, Lateral Ankle Wound Status: Open Wounding Event: Not Known Comorbid History: Hypertension Date Acquired: 01/19/2023 Rice Of Treatment: 3 Clustered Wound: No Photos Wound Measurements Length: (cm) 0.5 Width: (cm) 0.6 Depth: (cm) 1.7 Area: (cm) 0.236 Volume: (cm) 0.401 % Reduction in Area: 74.9% % Reduction in Volume: 52.7% Epithelialization: None Wound Description Classification: Unclassifiable Exudate Amount: Large Exudate Type: Purulent Exudate Color: yellow, brown, green Foul Odor After Cleansing: No Slough/Fibrino Yes Wound  Bed Granulation Amount: None Present (0%) Exposed Structure Necrotic Amount: Large (67-100%) Fascia Exposed: No Necrotic Quality: Adherent Slough Fat Layer (Subcutaneous Tissue) Exposed: Yes Tendon Exposed: No Muscle Exposed: No Joint Exposed: No Bone Exposed: No [name] ([id number].pdf Page 9 of 9 Treatment Notes Wound #1 (Ankle) Wound Laterality: Left, Lateral Cleanser Vashe 5.8 (oz) Discharge Instruction: Use vashe 5.8 (oz) as directed Peri-Wound Care Topical Primary Dressing Gauze Discharge Instruction: As directed: dry, moistened with saline Secondary Dressing ABD Pad 5x9 (in/in) Discharge Instruction: Cover with ABD pad Secured With Kerlix Roll Sterile or Non-Sterile 6-ply 4.5x4 (yd/yd) Discharge Instruction: Apply Kerlix as directed Compression Wrap Compression Stockings Add-Ons Electronic Signature(s) Signed: 02/22/2023 4:47:39 PM By: [name] Loud MSN RN CNS WTA Entered By: [name] on 02/22/2023 10:01:56 -------------------------------------------------------------------------------- Vitals Details Patient Name: Date of Service: CA RRA [place], [place] [name] M. 02/22/2023 9:30 A M Medical Record Number: [id number] Patient Account Number: [id number] Date of Birth/Sex: Treating RN: [birth year] (47 y.o. [name] Primary Care Masiah Woody: [name] Other Clinician: Referring Abigail Teall: Treating Elizabeth Haff/Extender: [name], [name] in Treatment: 3 Vital Signs Time Taken: 09:53 Temperature (F): 97.9 Height (in): 60 Pulse (bpm): 67 Weight (lbs): 145 Respiratory Rate (breaths/min): 16 Body Mass Index (BMI): 28.3 Blood Pressure (mmHg): 159/98 Reference Range: 80 - 120 mg / dl Electronic Signature(s) Signed: 02/22/2023 4:47:39 PM By: [name] Loud MSN RN CNS WTA Entered By: [name] on 02/22/2023 10:30:02

## 2023-03-01 ENCOUNTER — Other Ambulatory Visit
Admission: RE | Admit: 2023-03-01 | Discharge: 2023-03-01 | Disposition: A | Discharge status: Home / Self Care | Payer: Medicaid Other | Attending: Internal Medicine | Admitting: Internal Medicine

## 2023-03-01 DIAGNOSIS — B999 Unspecified infectious disease: Secondary | ICD-10-CM | POA: Insufficient documentation

## 2023-03-01 DIAGNOSIS — L97528 Non-pressure chronic ulcer of other part of left foot with other specified severity: Secondary | ICD-10-CM | POA: Diagnosis not present

## 2023-03-01 LAB — CBC WITH DIFFERENTIAL/PLATELET
Abs Immature Granulocytes: 0.01 10*3/uL (ref 0.00–0.07)
Basophils Absolute: 0.1 10*3/uL (ref 0.0–0.1)
Basophils Relative: 1 %
Eosinophils Absolute: 0.3 10*3/uL (ref 0.0–0.5)
Eosinophils Relative: 5 %
HCT: 32.9 % — ABNORMAL LOW (ref 36.0–46.0)
Hemoglobin: 11.8 g/dL — ABNORMAL LOW (ref 12.0–15.0)
Immature Granulocytes: 0 %
Lymphocytes Relative: 31 %
Lymphs Abs: 1.9 10*3/uL (ref 0.7–4.0)
MCH: 34.3 pg — ABNORMAL HIGH (ref 26.0–34.0)
MCHC: 35.9 g/dL (ref 30.0–36.0)
MCV: 95.6 fL (ref 80.0–100.0)
Monocytes Absolute: 0.7 10*3/uL (ref 0.1–1.0)
Monocytes Relative: 12 %
Neutro Abs: 3.1 10*3/uL (ref 1.7–7.7)
Neutrophils Relative %: 51 %
Platelets: 209 10*3/uL (ref 150–400)
RBC: 3.44 MIL/uL — ABNORMAL LOW (ref 3.87–5.11)
RDW: 11.9 % (ref 11.5–15.5)
WBC: 6.1 10*3/uL (ref 4.0–10.5)
nRBC: 0 % (ref 0.0–0.2)

## 2023-03-01 LAB — SEDIMENTATION RATE: Sed Rate: 28 mm/hr — ABNORMAL HIGH (ref 0–20)

## 2023-03-01 LAB — C-REACTIVE PROTEIN: CRP: <0.5 mg/dL (ref <1.0)

## 2023-03-03 NOTE — Progress Notes (Signed)
[name], [name] ([id number]) [id number] Page 1 of 2 Visit Report for 03/01/2023 Physician Orders Details Patient Name: Date of Service: CA RRA [place], [place]. 03/01/2023 2:00 PM Medical Record Number: [id number] Patient Account Number: [id number] Date of Birth/Sex: Treating RN: 1976/10/26 (47 y.o. [name] Primary Care Provider: Caryl Asp Other Clinician: Massie Kluver Referring Provider: Treating Provider/Extender: Alease Medina, [name] Weeks in Treatment: 4 Verbal / Phone Orders: No Diagnosis Coding Follow-up Appointments Return Appointment in 1 week. Nurse Visit as needed Bathing/ Shower/ Hygiene Clean wound with Normal Saline or wound cleanser. - Vashe Anesthetic (Use 'Patient Medications' Section for Anesthetic Order Entry) Lidocaine applied to wound bed Wound Treatment Wound #1 - Ankle Wound Laterality: Left, Lateral Cleanser: Vashe 5.8 (oz) 1 x Per Day/30 Days Discharge Instructions: Use vashe 5.8 (oz) as directed Prim Dressing: Gauze 1 x Per Day/30 Days ary Discharge Instructions: As directed: dry, moistened with saline Secondary Dressing: ABD Pad 5x9 (in/in) 1 x Per Day/30 Days Discharge Instructions: Cover with ABD pad Secured With: Kerlix Roll Sterile or Non-Sterile 6-ply 4.5x4 (yd/yd) 1 x Per Day/30 Days Discharge Instructions: Apply Kerlix as directed Electronic Signature(s) Signed: 03/01/2023 3:59:56 PM By: Massie Kluver Signed: 03/02/2023 3:53:59 PM By: Kalman Shan DO Entered By: Massie Kluver on 03/01/2023 14:35:47 -------------------------------------------------------------------------------- SuperBill Details Patient Name: Date of Service: CA RRA Theodoro Doing [name] 03/01/2023 Medical Record Number: [id number] Patient Account Number: [id number] Ulysees Barns ([id number]) [id number] Page 2 of 2 Date of Birth/Sex: Treating RN: [birth year] (47 y.o. 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

## 2023-03-03 NOTE — Progress Notes (Signed)
[name], [name] ([id number]) [id number] Page 1 of 5 Visit Report for 03/01/2023 Arrival Information Details Patient Name: Date of Service: [name] [place], [place]. 03/01/2023 2:00 PM Medical Record Number: [id number] Patient Account Number: [id number] Date of Birth/Sex: Treating Rice: 09-01-1976 (47 y.o. [name] Primary Care Nevayah Faust: Caryl Asp Other Clinician: Massie Kluver Referring Wing Schoch: Treating Josph Norfleet/Extender: Alease Medina, [name] Weeks in Treatment: 4 Visit Information History Since Last Visit All ordered tests and consults were completed: No Patient Arrived: Ambulatory Added or deleted any medications: No Arrival Time: 14:14 Any new allergies or adverse reactions: No Transfer Assistance: None Had a fall or experienced change in No Patient Identification Verified: Yes activities of daily living that may affect Secondary Verification Process Completed: Yes risk of falls: Patient Requires Transmission-Based Precautions: No Signs or symptoms of abuse/neglect since last visito No Patient Has Alerts: Yes Hospitalized since last visit: No Patient Alerts: NOT DIABETIC Implantable device outside of the clinic excluding No cellular tissue based products placed in the center since last visit: Has Dressing in Place as Prescribed: Yes Pain Present Now: No Electronic Signature(s) Signed: 03/01/2023 3:59:56 PM By: Massie Kluver Entered By: Massie Kluver on 03/01/2023 14:15:33 -------------------------------------------------------------------------------- Clinic Level of Care Assessment Details Patient Name: Date of Service: [name] [name], [name] M. 03/01/2023 2:00 PM Medical Record Number: [id number] Patient Account Number: [id number] Date of Birth/Sex: Treating Rice: [birth year] (47 y.o. [name] Primary Care Dwanna Goshert: Caryl Asp Other Clinician: Massie Kluver Referring Verlee Pope: Treating Amarie Viles/Extender:  Alease Medina, [name] Weeks in Treatment: 4 Clinic Level of Care Assessment Items TOOL 4 Quantity Score []  - 0 Use when only an EandM is performed on FOLLOW-UP visit ASSESSMENTS - Nursing Assessment / Reassessment X- 1 10 Reassessment of Co-morbidities (includes updates in patient status) X- 1 5 Reassessment of Adherence to Treatment Plan YASHVI, [place] ([id number]) [id number] Page 2 of 5 ASSESSMENTS - Wound and Skin A ssessment / Reassessment X - Simple Wound Assessment / Reassessment - one wound 1 5 []  - 0 Complex Wound Assessment / Reassessment - multiple wounds []  - 0 Dermatologic / Skin Assessment (not related to wound area) ASSESSMENTS - Focused Assessment []  - 0 Circumferential Edema Measurements - multi extremities []  - 0 Nutritional Assessment / Counseling / Intervention []  - 0 Lower Extremity Assessment (monofilament, tuning fork, pulses) []  - 0 Peripheral Arterial Disease Assessment (using hand held doppler) ASSESSMENTS - Ostomy and/or Continence Assessment and Care []  - 0 Incontinence Assessment and Management []  - 0 Ostomy Care Assessment and Management (repouching, etc.) PROCESS - Coordination of Care X - Simple Patient / Family Education for ongoing care 1 15 []  - 0 Complex (extensive) Patient / Family Education for ongoing care []  - 0 Staff obtains Programmer, systems, Records, T Results / Process Orders est []  - 0 Staff telephones HHA, Nursing Homes / Clarify orders / etc []  - 0 Routine Transfer to another Facility (non-emergent condition) []  - 0 Routine Hospital Admission (non-emergent condition) []  - 0 New Admissions / Biomedical engineer / Ordering NPWT Apligraf, etc. , []  - 0 Emergency Hospital Admission (emergent condition) X- 1 10 Simple Discharge Coordination []  - 0 Complex (extensive) Discharge Coordination PROCESS - Special Needs []  - 0 Pediatric / Minor Patient Management []  - 0 Isolation  Patient Management []  - 0 Hearing / Language / Visual special needs []  - 0 Assessment of Community assistance (transportation, D/C planning, etc.) []  - 0 Additional assistance / Altered mentation []  - 0 Support  Surface(s) Assessment (bed, cushion, seat, etc.) INTERVENTIONS - Wound Cleansing / Measurement X - Simple Wound Cleansing - one wound 1 5 []  - 0 Complex Wound Cleansing - multiple wounds X- 1 5 Wound Imaging (photographs - any number of wounds) []  - 0 Wound Tracing (instead of photographs) X- 1 5 Simple Wound Measurement - one wound []  - 0 Complex Wound Measurement - multiple wounds INTERVENTIONS - Wound Dressings X - Small Wound Dressing one or multiple wounds 1 10 []  - 0 Medium Wound Dressing one or multiple wounds []  - 0 Large Wound Dressing one or multiple wounds []  - 0 Application of Medications - topical []  - 0 Application of Medications - injection INTERVENTIONS - Miscellaneous []  - 0 External ear exam EVON, SIAM ([id number]) [id number] Page 3 of 5 []  - 0 Specimen Collection (cultures, biopsies, blood, body fluids, etc.) []  - 0 Specimen(s) / Culture(s) sent or taken to Lab for analysis []  - 0 Patient Transfer (multiple staff / Harrel Lemon Lift / Similar devices) []  - 0 Simple Staple / Suture removal (25 or less) []  - 0 Complex Staple / Suture removal (26 or more) []  - 0 Hypo / Hyperglycemic Management (close monitor of Blood Glucose) []  - 0 Ankle / Brachial Index (ABI) - do not check if billed separately []  - 0 Vital Signs Has the patient been seen at the hospital within the last three years: Yes Total Score: 70 Level Of Care: New/Established - Level 2 Electronic Signature(s) Signed: 03/01/2023 3:59:56 PM By: Massie Kluver Entered By: Massie Kluver on 03/01/2023 14:37:47 -------------------------------------------------------------------------------- Encounter Discharge Information Details Patient Name: Date of  Service: [name] [place], [place] [name] M. 03/01/2023 2:00 PM Medical Record Number: [id number] Patient Account Number: [id number] Date of Birth/Sex: Treating Rice: 1976-06-02 (47 y.o. [name] Primary Care Sergio Hobart: Caryl Asp Other Clinician: Massie Kluver Referring Carlisia Geno: Treating Bera Pinela/Extender: Alease Medina, [name] Weeks in Treatment: 4 Encounter Discharge Information Items Discharge Condition: Stable Ambulatory Status: Ambulatory Discharge Destination: Home Transportation: Private Auto Accompanied By: self Schedule Follow-up Appointment: Yes Clinical Summary of Care: Electronic Signature(s) Signed: 03/01/2023 3:59:56 PM By: Massie Kluver Entered By: Massie Kluver on 03/01/2023 14:36:31 Wound Assessment Details -------------------------------------------------------------------------------- [name] ([id number]) [id number] Page 4 of 5 Patient Name: Date of Service: [name] [place], [place]. 03/01/2023 2:00 PM Medical Record Number: [id number] Patient Account Number: [id number] Date of Birth/Sex: Treating Rice: [birth year] (47 y.o. Charolette Forward, [name] Primary Care Aysen Shieh: Caryl Asp Other Clinician: Massie Kluver Referring Venson Ferencz: Treating Vivaan Helseth/Extender: Alease Medina, [name] Weeks in Treatment: 4 Wound Status Wound Number: 1 Primary Etiology: Cellulitis Wound Location: Left, Lateral Ankle Wound Status: Open Wounding Event: Not Known Comorbid History: Hypertension Date Acquired: 01/19/2023 Weeks Of Treatment: 4 Clustered Wound: No Photos Wound Measurements Length: (cm) 0.9 % Reduction in Area: 40% Width: (cm) 0.8 % Reduction in Volume: 80% Depth: (cm) 0.3 Epithelialization: None Area: (cm) 0.565 Volume: (cm) 0.17 Wound Description Classification: Unclassifiable Foul Odor After Cleansing: No Exudate Amount: Large Slough/Fibrino Yes Exudate Type: Purulent Exudate Color: yellow, brown,  green Wound Bed Granulation Amount: None Present (0%) Exposed Structure Necrotic Amount: Large (67-100%) Fascia Exposed: No Necrotic Quality: Adherent Slough Fat Layer (Subcutaneous Tissue) Exposed: Yes Tendon Exposed: No Muscle Exposed: No Joint Exposed: No Bone Exposed: No Treatment Notes Wound #1 (Ankle) Wound Laterality: Left, Lateral Cleanser Vashe 5.8 (oz) Discharge Instruction: Use vashe 5.8 (oz) as directed Peri-Wound Care Topical Primary Dressing Gauze Discharge Instruction: As directed: dry, moistened with saline Secondary  Dressing ABD Pad 5x9 (in/in) Discharge Instruction: Cover with ABD pad Secured With Kerlix Roll Sterile or Non-Sterile 6-ply 4.5x4 (yd/yd) Discharge Instruction: Apply Kerlix as directed [name], [name] ([id number]) [id number] Page 5 of 5 Compression Wrap Compression Stockings Add-Ons Electronic Signature(s) Signed: 03/01/2023 3:59:56 PM By: Massie Kluver Signed: 03/02/2023 3:27:10 PM By: Gretta Cool, BSN, Rice, CWS, [name], [name] Entered By: Massie Kluver on 03/01/2023 14:28:29

## 2023-03-08 ENCOUNTER — Encounter (HOSPITAL_BASED_OUTPATIENT_CLINIC_OR_DEPARTMENT_OTHER): Payer: Medicaid Other | Admitting: Internal Medicine

## 2023-03-08 DIAGNOSIS — L089 Local infection of the skin and subcutaneous tissue, unspecified: Secondary | ICD-10-CM | POA: Diagnosis not present

## 2023-03-08 DIAGNOSIS — L97528 Non-pressure chronic ulcer of other part of left foot with other specified severity: Secondary | ICD-10-CM | POA: Diagnosis not present

## 2023-03-09 NOTE — Progress Notes (Addendum)
FLOWER, ABURTO ([id number]) [id number] Page 1 of 9 Visit Report for 03/08/2023 Arrival Information Details Patient Name: Date of Service: [name] [place], [place]. 03/08/2023 11:30 A M Medical Record Number: [id number] Patient Account Number: [id number] Date of Birth/Sex: Treating RN: 30-May-1976 (47 y.o. [name] Primary Care [name]: [name] Other Clinician: Referring [name]: Treating [name]/Extender: [name], [name] in Treatment: 5 Visit Information History Since Last Visit Added or deleted any medications: No Patient Arrived: Ambulatory Has Dressing in Place as Prescribed: Yes Arrival Time: 11:31 Pain Present Now: Yes Accompanied By: self Transfer Assistance: None Patient Identification Verified: Yes Secondary Verification Process Completed: Yes Patient Requires Transmission-Based Precautions: No Patient Has Alerts: Yes Patient Alerts: NOT DIABETIC Electronic Signature(s) Signed: 03/08/2023 5:15:10 PM By: [name], BSN, RN, CWS, Kim RN, BSN Entered By: [name], BSN, RN, CWS, [name] on 03/08/2023 11:34:42 -------------------------------------------------------------------------------- Clinic Level of Care Assessment Details Patient Name: Date of Service: [name] [place], [name] 03/08/2023 11:30 A M Medical Record Number: [id number] Patient Account Number: [id number] Date of Birth/Sex: Treating RN: 10-03-1976 (47 y.o. [name] Primary Care [name]: [name] Other Clinician: Referring [name]: Treating [name]/Extender: [name], [name] in Treatment: 5 Clinic Level of Care Assessment Items TOOL 4 Quantity Score []  - 0 Use when only an EandM is performed on FOLLOW-UP visit ASSESSMENTS - Nursing Assessment / Reassessment X- 1 10 Reassessment of Co-morbidities (includes updates in patient status) X- 1 5 Reassessment of Adherence to Treatment Plan ASSESSMENTS - Wound and  Skin A ssessment / Reassessment X - Simple Wound Assessment / Reassessment - one wound 1 5 [name] ([id number]) [id number] Page 2 of 9 []  - 0 Complex Wound Assessment / Reassessment - multiple wounds []  - 0 Dermatologic / Skin Assessment (not related to wound area) ASSESSMENTS - Focused Assessment []  - 0 Circumferential Edema Measurements - multi extremities []  - 0 Nutritional Assessment / Counseling / Intervention []  - 0 Lower Extremity Assessment (monofilament, tuning fork, pulses) []  - 0 Peripheral Arterial Disease Assessment (using hand held doppler) ASSESSMENTS - Ostomy and/or Continence Assessment and Care []  - 0 Incontinence Assessment and Management []  - 0 Ostomy Care Assessment and Management (repouching, etc.) PROCESS - Coordination of Care X - Simple Patient / Family Education for ongoing care 1 15 []  - 0 Complex (extensive) Patient / Family Education for ongoing care X- 1 10 Staff obtains Programmer, systems, Records, T Results / Process Orders est []  - 0 Staff telephones HHA, Nursing Homes / Clarify orders / etc []  - 0 Routine Transfer to another Facility (non-emergent condition) []  - 0 Routine Hospital Admission (non-emergent condition) []  - 0 New Admissions / Biomedical engineer / Ordering NPWT Apligraf, etc. , []  - 0 Emergency Hospital Admission (emergent condition) []  - 0 Simple Discharge Coordination X- 1 15 Complex (extensive) Discharge Coordination PROCESS - Special Needs []  - 0 Pediatric / Minor Patient Management []  - 0 Isolation Patient Management []  - 0 Hearing / Language / Visual special needs []  - 0 Assessment of Community assistance (transportation, D/C planning, etc.) []  - 0 Additional assistance / Altered mentation []  - 0 Support Surface(s) Assessment (bed, cushion, seat, etc.) INTERVENTIONS - Wound Cleansing / Measurement X - Simple Wound Cleansing - one wound 1 5 []  - 0 Complex Wound Cleansing -  multiple wounds X- 1 5 Wound Imaging (photographs - any number of wounds) []  - 0 Wound Tracing (instead of photographs) X- 1 5 Simple Wound Measurement -  one wound []  - 0 Complex Wound Measurement - multiple wounds INTERVENTIONS - Wound Dressings []  - 0 Small Wound Dressing one or multiple wounds X- 1 15 Medium Wound Dressing one or multiple wounds []  - 0 Large Wound Dressing one or multiple wounds []  - 0 Application of Medications - topical []  - 0 Application of Medications - injection INTERVENTIONS - Miscellaneous []  - 0 External ear exam []  - 0 Specimen Collection (cultures, biopsies, blood, body fluids, etc.) []  - 0 Specimen(s) / Culture(s) sent or taken to Lab for analysis [name], [name] ([id number]) [id number] Page 3 of 9 []  - 0 Patient Transfer (multiple staff / Civil Service fast streamer / Similar devices) []  - 0 Simple Staple / Suture removal (25 or less) []  - 0 Complex Staple / Suture removal (26 or more) []  - 0 Hypo / Hyperglycemic Management (close monitor of Blood Glucose) []  - 0 Ankle / Brachial Index (ABI) - do not check if billed separately X- 1 5 Vital Signs Has the patient been seen at the hospital within the last three years: Yes Total Score: 95 Level Of Care: New/Established - Level 3 Electronic Signature(s) Unsigned Entered By: [name], BSN, RN, CWS, [name] on 03/09/2023 17:01:18 -------------------------------------------------------------------------------- Encounter Discharge Information Details Patient Name: Date of Service: [name] [place], [place] [name]. 03/08/2023 11:30 A M Medical Record Number: [id number] Patient Account Number: [id number] Date of Birth/Sex: Treating RN: 10-01-1976 (47 y.o. [name] Primary Care Kadasia Kassing: [name] Other Clinician: Referring [name]: Treating Ahkeem Goede/Extender: [name], [name] in Treatment: 5 Encounter Discharge Information Items Discharge Condition:  Stable Ambulatory Status: Ambulatory Discharge Destination: Home Transportation: Private Auto Schedule Follow-up Appointment: Yes Clinical Summary of Care: Electronic Signature(s) Signed: 03/09/2023 5:10:31 PM By: [name], BSN, RN, CWS, Kim RN, BSN Entered By: [name], BSN, RN, CWS, [name] on 03/09/2023 17:10:30 -------------------------------------------------------------------------------- Lower Extremity Assessment Details Patient Name: Date of Service: [name] [place], [name] 03/08/2023 11:30 A M Medical Record Number: [id number] Patient Account Number: [id number] Date of Birth/Sex: Treating RN: 10-15-1976 (47 y.o. [name] [place], [place] ([id number]) [id number] Page 4 of 9 Primary Care Demetruis Depaul: [name] Other Clinician: Referring Joelyn Lover: Treating Kacie Huxtable/Extender: [name], [name] in Treatment: 5 Edema Assessment Assessed: [Left: No] [Right: No] [Left: Edema] [Right: :] Calf Left: Right: Point of Measurement: 29 cm From Medial Instep 37.2 cm Ankle Left: Right: Point of Measurement: 12 cm From Medial Instep 25.6 cm Vascular Assessment Pulses: Dorsalis Pedis Palpable: [Left:Yes] Electronic Signature(s) Signed: 03/08/2023 5:15:10 PM By: [name], BSN, RN, CWS, Kim RN, BSN Entered By: [name], BSN, RN, CWS, [name] on 03/08/2023 11:43:41 -------------------------------------------------------------------------------- Multi Wound Chart Details Patient Name: Date of Service: [name] [place], [name] 03/08/2023 11:30 A M Medical Record Number: [id number] Patient Account Number: [id number] Date of Birth/Sex: Treating RN: September 27, 1976 (47 y.o. [name] Primary Care Sylvanna Burggraf: [name] Other Clinician: Referring Shatonya Passon: Treating Fronie Holstein/Extender: [name], [name] in Treatment: 5 Vital Signs Height(in): 60 Pulse(bpm): 71 Weight(lbs): 145 Blood Pressure(mmHg): 157/84 Body Mass  Index(BMI): 28.3 Temperature(F): 97.7 Respiratory Rate(breaths/min): 16 [1:Photos:] [N/A:N/A] Left, Lateral Ankle N/A N/A Wound Location: Not Known N/A N/A Wounding Event: Cellulitis N/A N/A Primary Etiology: Hypertension N/A N/A Comorbid History: 01/19/2023 N/A N/A Date Acquired: ALAIRE, HETTLER ([id number]) [id number] Page 5 of 9 5 N/A N/A Rice of Treatment: Open N/A N/A Wound Status: No N/A N/A Wound Recurrence: 0.9x0.8x0.3 N/A N/A Measurements L x W x D (cm) 0.565 N/A N/A A (  cm) : rea 0.17 N/A N/A Volume (cm) : 40.00% N/A N/A % Reduction in A rea: 80.00% N/A N/A % Reduction in Volume: Unclassifiable N/A N/A Classification: Large N/A N/A Exudate A mount: Purulent N/A N/A Exudate Type: yellow, brown, green N/A N/A Exudate Color: None Present (0%) N/A N/A Granulation A mount: Large (67-100%) N/A N/A Necrotic A mount: Fat Layer (Subcutaneous Tissue): Yes N/A N/A Exposed Structures: Fascia: No Tendon: No Muscle: No Joint: No Bone: No None N/A N/A Epithelialization: Treatment Notes Electronic Signature(s) Signed: 03/08/2023 5:15:10 PM By: [name], BSN, RN, CWS, Kim RN, BSN Entered By: [name], BSN, RN, CWS, [name] on 03/08/2023 12:04:13 -------------------------------------------------------------------------------- Multi-Disciplinary Care Plan Details Patient Name: Date of Service: [name] [place], [name] 03/08/2023 11:30 A M Medical Record Number: [id number] Patient Account Number: [id number] Date of Birth/Sex: Treating RN: 09-15-1976 (47 y.o. [name] Primary Care Dannie Woolen: [name] Other Clinician: Referring Stepahnie Campo: Treating Keiaira Donlan/Extender: [name], [name] in Treatment: 5 Active Inactive Necrotic Tissue Nursing Diagnoses: Impaired tissue integrity related to necrotic/devitalized tissue Knowledge deficit related to management of necrotic/devitalized  tissue Goals: Necrotic/devitalized tissue will be minimized in the wound bed Date Initiated: 02/01/2023 Target Resolution Date: 03/02/2023 Goal Status: Active Patient/caregiver will verbalize understanding of reason and process for debridement of necrotic tissue Date Initiated: 02/01/2023 Target Resolution Date: 03/02/2023 Goal Status: Active Interventions: Assess patient pain level pre-, during and post procedure and prior to discharge Provide education on necrotic tissue and debridement process Treatment Activities: Enzymatic debridement : 02/01/2023 Notes: [name], [name] ([id number]) [id number] Page 6 of 9 Orientation to the Wound Care Program Nursing Diagnoses: Knowledge deficit related to the wound healing center program Goals: Patient/caregiver will verbalize understanding of the [place] Date Initiated: 02/01/2023 Target Resolution Date: 02/15/2023 Goal Status: Active Interventions: Provide education on orientation to the wound center Notes: Wound/Skin Impairment Nursing Diagnoses: Impaired tissue integrity Knowledge deficit related to smoking impact on wound healing Knowledge deficit related to ulceration/compromised skin integrity Goals: Patient will demonstrate a reduced rate of smoking or cessation of smoking Date Initiated: 02/01/2023 Target Resolution Date: 03/02/2023 Goal Status: Active Patient will have a decrease in wound volume by X% from date: (specify in notes) Date Initiated: 02/01/2023 Target Resolution Date: 03/02/2023 Goal Status: Active Patient/caregiver will verbalize understanding of skin care regimen Date Initiated: 02/01/2023 Target Resolution Date: 02/17/2023 Goal Status: Active Ulcer/skin breakdown will have a volume reduction of 30% by week 4 Date Initiated: 02/01/2023 Target Resolution Date: 03/02/2023 Goal Status: Active Ulcer/skin breakdown will have a volume reduction of 50% by week 8 Date  Initiated: 02/01/2023 Target Resolution Date: 04/02/2023 Goal Status: Active Ulcer/skin breakdown will have a volume reduction of 80% by week 12 Date Initiated: 02/01/2023 Target Resolution Date: 05/02/2023 Goal Status: Active Interventions: Assess patient/caregiver ability to obtain necessary supplies Assess patient/caregiver ability to perform ulcer/skin care regimen upon admission and as needed Assess ulceration(s) every visit Provide education on smoking Provide education on ulcer and skin care Treatment Activities: Skin care regimen initiated : 02/01/2023 Smoking cessation education : 02/01/2023 Notes: Electronic Signature(s) Signed: 03/09/2023 5:02:05 PM By: [name], BSN, RN, CWS, Kim RN, BSN Entered By: [name], BSN, RN, CWS, [name] on 03/09/2023 17:02:05 -------------------------------------------------------------------------------- Pain Assessment Details Patient Name: Date of Service: [name] [place], [name] 03/08/2023 11:30 A [name] ([id number]) [id number] Page 7 of 9 Medical Record Number: [id number] Patient Account Number: [id number] Date of Birth/Sex: Treating RN: 1976-02-11 (47 y.o. [name] Primary Care  Jilliam Bellmore: [name] Other Clinician: Referring Ulas Zuercher: Treating Khelani Kops/Extender: [name], [name] in Treatment: 5 Active Problems Location of Pain Severity and Description of Pain Patient Has Paino Yes Site Locations Rate the pain. Current Pain Level: 6 Character of Pain Describe the Pain: Burning, Tender Pain Management and Medication Current Pain Management: Electronic Signature(s) Signed: 03/08/2023 5:15:10 PM By: [name], BSN, RN, CWS, Kim RN, BSN Entered By: [name], BSN, RN, CWS, [name] on 03/08/2023 11:35:35 -------------------------------------------------------------------------------- Wound Assessment Details Patient Name: Date of Service: [name] [place], [name] 03/08/2023 11:30 A  M Medical Record Number: [id number] Patient Account Number: [id number] Date of Birth/Sex: Treating RN: May 29, 1976 (47 y.o. [name] Primary Care Amillya Chavira: [name] Other Clinician: Referring Abem Shaddix: Treating Abem Shaddix/Extender: [name], [name] in Treatment: 5 Wound Status Wound Number: 1 Primary Etiology: Cellulitis Wound Location: Left, Lateral Ankle Wound Status: Open Wounding Event: Not Known Comorbid History: Hypertension Date Acquired: 01/19/2023 Rice Of Treatment: 5 Clustered Wound: No Photos [name], [name] ([id number]) [id number] Page 8 of 9 Wound Measurements Length: (cm) 0.9 Width: (cm) 0.8 Depth: (cm) 0.3 Area: (cm) 0.565 Volume: (cm) 0.17 % Reduction in Area: 40% % Reduction in Volume: 80% Epithelialization: None Wound Description Classification: Unclassifiable Exudate Amount: Large Exudate Type: Purulent Exudate Color: yellow, brown, green Foul Odor After Cleansing: No Slough/Fibrino Yes Wound Bed Granulation Amount: None Present (0%) Exposed Structure Necrotic Amount: Large (67-100%) Fascia Exposed: No Necrotic Quality: Adherent Slough Fat Layer (Subcutaneous Tissue) Exposed: Yes Tendon Exposed: No Muscle Exposed: No Joint Exposed: No Bone Exposed: No Treatment Notes Wound #1 (Ankle) Wound Laterality: Left, Lateral Cleanser Vashe 5.8 (oz) Discharge Instruction: Use vashe 5.8 (oz) as directed Peri-Wound Care Topical Primary Dressing Gauze Discharge Instruction: As directed: dry, moistened with saline Secondary Dressing ABD Pad 5x9 (in/in) Discharge Instruction: Cover with ABD pad Secured With Kerlix Roll Sterile or Non-Sterile 6-ply 4.5x4 (yd/yd) Discharge Instruction: Apply Kerlix as directed Compression Wrap Compression Stockings Add-Ons Electronic Signature(s) Signed: 03/08/2023 5:15:10 PM By: [name], BSN, RN, CWS, Kim RN, BSN Entered By: [name], BSN, RN, CWS, [name] on  03/08/2023 11:42:21 [name] ([id number]) [id number] Page 9 of 9 -------------------------------------------------------------------------------- Vitals Details Patient Name: Date of Service: [name] [place]. 03/08/2023 11:30 A M Medical Record Number: [id number] Patient Account Number: [id number] Date of Birth/Sex: Treating RN: [birth year] (47 y.o. [name] Primary Care Jayley Hustead: [name] Other Clinician: Referring Nieko Clarin: Treating Treylon Henard/Extender: [name], [name] in Treatment: 5 Vital Signs Time Taken: 11:34 Temperature (F): 97.7 Height (in): 60 Pulse (bpm): 71 Weight (lbs): 145 Respiratory Rate (breaths/min): 16 Body Mass Index (BMI): 28.3 Blood Pressure (mmHg): 157/84 Reference Range: 80 - 120 mg / dl Electronic Signature(s) Signed: 03/08/2023 5:15:10 PM By: [name], BSN, RN, CWS, Kim RN, BSN Entered By: [name], BSN, RN, CWS, [name] on 03/08/2023 11:46:27

## 2023-03-09 NOTE — Progress Notes (Signed)
[name], [name] ([id number]) [id number] Page 1 of 6 Visit Report for 03/08/2023 Chief Complaint Document Details Patient Name: Date of Service: [place] RRA [name]. 03/08/2023 11:30 A M Medical Record Number: [id number] Patient Account Number: [id number] Date of Birth/Sex: Treating RN: July 30, 1976 (47 y.o. [name] Primary Care Provider: Caryl Rice Other Clinician: Referring Provider: Treating Provider/Extender: [name], [name]: 5 Information Obtained from: Patient Chief Complaint 02/01/2023; Left ankle wound Electronic Signature(s) Signed: 03/08/2023 2:38:02 PM By: [name] Shan DO Entered By: [name] on 03/08/2023 12:43:10 -------------------------------------------------------------------------------- HPI Details Patient Name: Date of Service: [place] RRA [place], [place] Rice M. 03/08/2023 11:30 A M Medical Record Number: [id number] Patient Account Number: [id number] Date of Birth/Sex: Treating RN: Apr 26, 1976 (47 y.o. [name] Primary Care Provider: Caryl Rice Other Clinician: Referring Provider: Treating Provider/Extender: [name], [name]: 5 History of Present Illness HPI Description: 02/01/2023 [name] is a 47 year old female with a past medical history of alcohol/drug abuse and hypertension that presents to the clinic for a 33-month history of nonhealing ulcer to the left lateral ankle. She states it started out as cellulitis And subsequently developed a wound to the area. She has tried Keflex, doxycycline and Levaquin for this issue. She reports that the erythema had improved however still has chronic pain to the wound site with increased swelling to the ankle. She has been keeping the area covered. 2/28; patient missed her last clinic appointment. She has been taking doxycycline and Augmentin and states she has a few days left. She had an  x-ray done at last clinic visit that showed potential subtle periosteal reaction to the lateral shaft of the fibula that could be consistent with osteomyelitis. An MRI was ordered and she did obtain this however results have not come through. She has been using Vashe wet-to-dry dressings. Overall her symptoms are stable. 3/6; patient presents for follow-up. She had an MRI completed of her left ankle. Results were not conclusive. It noted trace subcortical marrow edema within the distal lateral fibular metaphyseal/epiphyseal region with possible minimal cortical thinning. This may be reactive and noninfected however cannot exclude very early osteomyelitis. No tendon tear or abscess noted . She has been using Vashe wet-to-dry dressings. 3/20; patient presents for follow-up. Sed rate was 28 and C-reactive protein was less than 0.5. Patient is still taking her oral antibiotics. She has been using Vashe wet-to-dry dressings. [name], Rice ([id number]) [id number] Page 2 of 6 Electronic Signature(s) Signed: 03/08/2023 2:38:02 PM By: [name] Shan DO Entered By: [name] on 03/08/2023 12:44:37 -------------------------------------------------------------------------------- Physical Exam Details Patient Name: Date of Service: [place] RRA [place]. 03/08/2023 11:30 A M Medical Record Number: [id number] Patient Account Number: [id number] Date of Birth/Sex: Treating RN: 1976/09/16 (47 y.o. [name] Primary Care Provider: Caryl Rice Other Clinician: Referring Provider: Treating Provider/Extender: [name], [name]: 5 Constitutional . Cardiovascular . Psychiatric . Notes Left foot: T the lateral ankle there is an open wound with nonviable tissue throughout. Increased swelling to the ankle. Pain on palpation. No purulent drainage o noted. Electronic Signature(s) Signed: 03/08/2023 2:38:02 PM By: [name] Shan DO Entered By: [name] on 03/08/2023 12:44:55 -------------------------------------------------------------------------------- Physician Orders Details Patient Name: Date of Service: [place] RRA [place], [place] Rice M. 03/08/2023 11:30 A M Medical Record Number: [id number] Patient Account Number: [id number] Date of Birth/Sex: Treating RN: 10/08/1976 (47 y.o. [name] Primary Care Provider: Hattie Perch,  [name] Other Clinician: Referring Provider: Treating Provider/Extender: [name], [name]: 5 Verbal / Phone Orders: No Diagnosis Coding Follow-up Appointments Return Appointment in 3 weeks. Nurse Visit as needed [name], Rice ([id number]) [id number] Page 3 of 6 Bathing/ Shower/ Hygiene Clean wound with Normal Saline or wound cleanser. - Vashe Anesthetic (Use 'Patient Medications' Section for Anesthetic Order Entry) Lidocaine applied to wound bed Wound Rice Wound #1 - Ankle Wound Laterality: Left, Lateral Cleanser: Vashe 5.8 (oz) 1 x Per Day/30 Days Discharge Instructions: Use vashe 5.8 (oz) as directed Prim Dressing: Gauze 1 x Per Day/30 Days ary Discharge Instructions: As directed: dry, moistened with saline Secondary Dressing: ABD Pad 5x9 (in/in) 1 x Per Day/30 Days Discharge Instructions: Cover with ABD pad Secured With: Kerlix Roll Sterile or Non-Sterile 6-ply 4.5x4 (yd/yd) 1 x Per Day/30 Days Discharge Instructions: Apply Kerlix as directed Consults Infectious Disease - left lateral ankle Orthopedics - Left lateral ankle Electronic Signature(s) Signed: 03/08/2023 2:38:02 PM By: [name] Shan DO Entered By: [name] on 03/08/2023 12:47:22 -------------------------------------------------------------------------------- Problem List Details Patient Name: Date of Service: [place] RRA [place] M. 03/08/2023 11:30 A M Medical Record Number: [id number] Patient Account Number:  [id number] Date of Birth/Sex: Treating RN: 10-08-1976 (47 y.o. 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 03/08/2023 11:30 A M Medical Record Number: [id number] Patient Account Number: [id number] Date of Birth/Sex: Treating RN: October 06, 1976 (47 y.o. [name] Primary Care Provider: Caryl Rice Other Clinician: Referring Provider: Treating Provider/Extender: [name], [name]: 5 Subjective Chief Complaint Information obtained from Patient 02/01/2023; Left ankle wound History of Present Illness (HPI) 02/01/2023 [name] is a 47 year old female with a past medical history of alcohol/drug abuse and hypertension that presents to the clinic for a 14-month history of nonhealing ulcer to the left lateral ankle. She states it started out as cellulitis And subsequently developed a wound to the area. She has tried Keflex, doxycycline and Levaquin for this issue. She reports that the erythema had improved however still has chronic pain to the wound site with increased swelling to the ankle. She has been keeping the area covered. 2/28;  patient missed her last clinic appointment. She has been taking doxycycline and Augmentin and states she has a few days left. She had an x-ray done at last clinic visit that showed potential subtle periosteal reaction to the lateral shaft of the fibula that could be consistent with osteomyelitis. An MRI was ordered and she did obtain this however results have not come through. She has been using Vashe wet-to-dry dressings. Overall her symptoms are stable. 3/6; patient presents for follow-up. She had an MRI completed of her left ankle. Results were not conclusive. It noted trace subcortical marrow edema within the distal lateral fibular metaphyseal/epiphyseal region with possible minimal cortical thinning. This may be reactive and noninfected however cannot exclude very early osteomyelitis. No tendon  tear or abscess noted . She has been using Vashe wet-to-dry dressings. 3/20; patient presents for follow-up. Sed rate was 28 and C-reactive protein was less than 0.5. Patient is still taking her oral antibiotics. She has been using Vashe wet-to-dry dressings. Objective Constitutional Vitals Time Taken: 11:34 AM, Height: 60 in, Weight: 145 lbs, BMI: 28.3, Temperature: 97.7 F, Pulse: 71 bpm, Respiratory Rate: 16 breaths/min, Blood Pressure: 157/84 mmHg. General Notes: Left foot: T the lateral ankle there is an open wound with nonviable tissue throughout. Increased swelling to the ankle. Pain on palpation. No o purulent drainage noted. Integumentary (Hair, Skin) Wound #1 status is Open. Original cause of wound was Not Known. The date acquired was: 01/19/2023. The wound has been in Rice 5 weeks. The wound is located on the Left,Lateral Ankle. The wound measures 0.9cm length x 0.8cm width x 0.3cm depth; 0.565cm^2 area and 0.17cm^3 volume. There is Fat Layer (Subcutaneous Tissue) exposed. There is a large amount of purulent drainage noted. There is no granulation within the wound bed. There is a large  (67-100%) amount of necrotic tissue within the wound bed including Adherent Slough. Assessment [address] [name], Rice ([id number]) [id number] Page 5 of 6 ICD-10 Non-pressure chronic ulcer of other part of left foot with other specified severity Local infection of the skin and subcutaneous tissue, unspecified Patient's wound and ankle swelling is stable. She has almost completed 3 weeks of oral antibiotics. Sed rate was elevated but CRP was not. At this time I recommend orthopedic surgery referral. Will also refer to infectious disease as patient did improve with oral antibiotics and sed rate elevated. For now she can continue Vashe wet-to-dry dressings. Follow-up in 3 weeks. Plan Follow-up Appointments: Return Appointment in 3 weeks. Nurse Visit as needed Bathing/ Shower/ Hygiene: Clean wound with Normal Saline or wound cleanser. - Vashe Anesthetic (Use 'Patient Medications' Section for Anesthetic Order Entry): Lidocaine applied to wound bed Consults ordered were: Infectious Disease - left lateral ankle, Orthopedics - Left lateral ankle WOUND #1: - Ankle Wound Laterality: Left, Lateral Cleanser: Vashe 5.8 (oz) 1 x Per Day/30 Days Discharge Instructions: Use vashe 5.8 (oz) as directed Prim Dressing: Gauze 1 x Per Day/30 Days ary Discharge Instructions: As directed: dry, moistened with saline Secondary Dressing: ABD Pad 5x9 (in/in) 1 x Per Day/30 Days Discharge Instructions: Cover with ABD pad Secured With: Kerlix Roll Sterile or Non-Sterile 6-ply 4.5x4 (yd/yd) 1 x Per Day/30 Days Discharge Instructions: Apply Kerlix as directed 1. Vashe wet-to-dry dressings 2. Referral to infectious disease 3. Referral to orthopedic surgery 4. Follow-up in 3 weeks Electronic Signature(s) Signed: 03/08/2023 2:38:02 PM By: [name] Shan DO Entered By: [name] on 03/08/2023  12:47:04 -------------------------------------------------------------------------------- ROS/PFSH Details Patient Name: Date of Service: [place] RRA [place], [place] Rice M. 03/08/2023 11:30 A M Medical Record Number: [id number] Patient Account Number: [id number] Date of Birth/Sex: Treating RN: 1976/03/29 (48 y.o. [name] Primary Care Provider: Caryl Rice Other Clinician: Referring Provider: Treating Provider/Extender: [name], [name]: 5 Information Obtained From Patient Eyes Medical History: Negative for: Cataracts; Glaucoma; Optic Neuritis Cardiovascular Medical History: Positive for: Hypertension [name], Rice ([id number]) [id number] Page 6 of 6 Psychiatric Medical History: Negative for: Anorexia/bulimia; Confinement Anxiety Immunizations Pneumococcal Vaccine: Received Pneumococcal Vaccination: No Implantable Devices None Family and Social History Current every day smoker - 5 cig/day; Marital Status - Divorced; Alcohol Use: Moderate - couple times week; Drug Use: No History; Caffeine Use: Rarely Electronic Signature(s) Signed: 03/08/2023 2:38:02 PM By: Heber [place],  Janett Billow DO Signed: 03/08/2023 5:15:10 PM By: Gretta Cool, BSN, RN, CWS, Kim RN, BSN Entered By: [name] on 03/08/2023 12:47:31 -------------------------------------------------------------------------------- SuperBill Details Patient Name: Date of Service: [place] RRA [place], [name] 03/08/2023 Medical Record Number: [id number] Patient Account Number: [id number] Date of Birth/Sex: Treating RN: May 09, 1976 (47 y.o. [name] Primary Care Provider: Caryl Rice Other Clinician: Referring Provider: Treating Provider/Extender: [name], [name]: 5 Diagnosis Coding ICD-10 Codes Code Description [phone] Non-pressure chronic ulcer of other part of left foot with other specified severity L08.9 Local  infection of the skin and subcutaneous tissue, unspecified Facility Procedures : CPT4 Code: [id number] Description: 99213 - WOUND CARE VISIT-LEV 3 EST PT Modifier: Quantity: 1 Physician Procedures : CPT4 Code Description Modifier [id number] - WC PHYS LEVEL 3 - EST PT ICD-10 Diagnosis Description L97.528 Non-pressure chronic ulcer of other part of left foot with other specified severity L08.9 Local infection of the skin and subcutaneous tissue,  unspecified Quantity: 1 Electronic Signature(s) Signed: 03/09/2023 5:01:28 PM By: Gretta Cool, BSN, RN, CWS, Kim RN, BSN Previous Signature: 03/08/2023 2:38:02 PM Version By: [name] Shan DO Entered By: Gretta Cool, BSN, RN, CWS, Kim on 03/09/2023 17:01:28

## 2023-03-15 ENCOUNTER — Ambulatory Visit: Payer: Self-pay | Admitting: Internal Medicine

## 2023-03-16 ENCOUNTER — Ambulatory Visit: Payer: Self-pay | Admitting: Infectious Diseases

## 2023-03-21 ENCOUNTER — Ambulatory Visit: Payer: Self-pay | Admitting: Gerontology

## 2023-03-23 ENCOUNTER — Other Ambulatory Visit
Admission: RE | Admit: 2023-03-23 | Discharge: 2023-03-23 | Disposition: A | Discharge status: Home / Self Care | Payer: Medicaid Other | Source: Ambulatory Visit | Attending: Infectious Diseases | Admitting: Infectious Diseases

## 2023-03-23 ENCOUNTER — Ambulatory Visit: Payer: Medicaid Other | Attending: Infectious Diseases | Admitting: Infectious Diseases

## 2023-03-23 ENCOUNTER — Encounter: Payer: Self-pay | Admitting: Infectious Diseases

## 2023-03-23 VITALS — BP 166/98 | HR 80 | Temp 96.4°F | Ht 60.0 in | Wt 149.0 lb

## 2023-03-23 DIAGNOSIS — F1010 Alcohol abuse, uncomplicated: Secondary | ICD-10-CM | POA: Diagnosis not present

## 2023-03-23 DIAGNOSIS — J690 Pneumonitis due to inhalation of food and vomit: Secondary | ICD-10-CM | POA: Diagnosis not present

## 2023-03-23 DIAGNOSIS — X58XXXA Exposure to other specified factors, initial encounter: Secondary | ICD-10-CM | POA: Insufficient documentation

## 2023-03-23 DIAGNOSIS — R600 Localized edema: Secondary | ICD-10-CM | POA: Insufficient documentation

## 2023-03-23 DIAGNOSIS — S81802A Unspecified open wound, left lower leg, initial encounter: Secondary | ICD-10-CM | POA: Diagnosis present

## 2023-03-23 DIAGNOSIS — Z91199 Patient's noncompliance with other medical treatment and regimen due to unspecified reason: Secondary | ICD-10-CM | POA: Insufficient documentation

## 2023-03-23 LAB — CBC WITH DIFFERENTIAL/PLATELET
Abs Immature Granulocytes: 0.01 10*3/uL (ref 0.00–0.07)
Basophils Absolute: 0 10*3/uL (ref 0.0–0.1)
Basophils Relative: 1 %
Eosinophils Absolute: 0.2 10*3/uL (ref 0.0–0.5)
Eosinophils Relative: 4 %
HCT: 31.1 % — ABNORMAL LOW (ref 36.0–46.0)
Hemoglobin: 10.6 g/dL — ABNORMAL LOW (ref 12.0–15.0)
Immature Granulocytes: 0 %
Lymphocytes Relative: 30 %
Lymphs Abs: 1.5 10*3/uL (ref 0.7–4.0)
MCH: 33.1 pg (ref 26.0–34.0)
MCHC: 34.1 g/dL (ref 30.0–36.0)
MCV: 97.2 fL (ref 80.0–100.0)
Monocytes Absolute: 0.5 10*3/uL (ref 0.1–1.0)
Monocytes Relative: 11 %
Neutro Abs: 2.7 10*3/uL (ref 1.7–7.7)
Neutrophils Relative %: 54 %
Platelets: 214 10*3/uL (ref 150–400)
RBC: 3.2 MIL/uL — ABNORMAL LOW (ref 3.87–5.11)
RDW: 11.7 % (ref 11.5–15.5)
WBC: 5 10*3/uL (ref 4.0–10.5)
nRBC: 0 % (ref 0.0–0.2)

## 2023-03-23 LAB — COMPREHENSIVE METABOLIC PANEL
ALT: 32 U/L (ref 0–44)
AST: 85 U/L — ABNORMAL HIGH (ref 15–41)
Albumin: 3.8 g/dL (ref 3.5–5.0)
Alkaline Phosphatase: 80 U/L (ref 38–126)
Anion gap: 12 (ref 5–15)
BUN: 6 mg/dL (ref 6–20)
CO2: 24 mmol/L (ref 22–32)
Calcium: 8.7 mg/dL — ABNORMAL LOW (ref 8.9–10.3)
Chloride: 94 mmol/L — ABNORMAL LOW (ref 98–111)
Creatinine, Ser: 0.51 mg/dL (ref 0.44–1.00)
GFR, Estimated: >60 mL/min (ref >60)
Glucose, Bld: 126 mg/dL — ABNORMAL HIGH (ref 70–99)
Potassium: 3.6 mmol/L (ref 3.5–5.1)
Sodium: 130 mmol/L — ABNORMAL LOW (ref 135–145)
Total Bilirubin: 0.7 mg/dL (ref 0.3–1.2)
Total Protein: 7.8 g/dL (ref 6.5–8.1)

## 2023-03-23 LAB — C-REACTIVE PROTEIN: CRP: <0.5 mg/dL (ref <1.0)

## 2023-03-23 LAB — SEDIMENTATION RATE: Sed Rate: 48 mm/hr — ABNORMAL HIGH (ref 0–20)
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
## 2023-03-23 NOTE — Patient Instructions (Signed)
You are here for a wound left ankle which has scabbed but not overtly infected- you need to wear compression wrap first thing before you get out of bed and remove it at night time when you go to bed Will do labs today and see you in 2 weeks Need to see foot surgeon

## 2023-03-28 ENCOUNTER — Ambulatory Visit: Payer: Self-pay | Admitting: Gerontology

## 2023-03-29 ENCOUNTER — Ambulatory Visit: Payer: Self-pay | Admitting: Internal Medicine

## 2023-03-29 NOTE — Progress Notes (Signed)
ROSIA, RHOME ([id number]) [id number] Page 1 of 1 Visit Report for 03/15/2023 Problem List Details Patient Name: Date of Service: [place] RRA [place], [name]. 03/15/2023 3:15 PM Medical Record Number: [id number] Patient Account Number: [id number] Date of Birth/Sex: Treating RN: Nov 30, 1976 (47 y.o. [name] Primary Care Provider: [name] Other Clinician: Referring Provider: Treating Provider/Extender: Gilford Rile, [name] Weeks in Treatment: 6 Active Problems ICD-10 Encounter Code Description Active Date MDM Diagnosis L97.528 Non-pressure chronic ulcer of other part of left foot with other 02/01/2023 No Yes specified severity L08.9 Local infection of the skin and subcutaneous tissue, 02/01/2023 No Yes unspecified Inactive Problems Resolved Problems Electronic Signature(s) Signed: 03/15/2023 11:48:34 AM By: Geralyn Corwin DO Entered By: Geralyn Corwin on 03/15/2023 08:47:54

## 2023-03-30 ENCOUNTER — Other Ambulatory Visit: Payer: Self-pay

## 2023-03-30 ENCOUNTER — Encounter: Payer: Self-pay | Admitting: Gerontology

## 2023-03-30 ENCOUNTER — Ambulatory Visit: Payer: Self-pay | Admitting: Gerontology

## 2023-03-30 VITALS — BP 154/83 | HR 72 | Temp 98.1°F | Resp 16 | Ht 60.0 in | Wt 150.5 lb

## 2023-03-30 DIAGNOSIS — R002 Palpitations: Secondary | ICD-10-CM

## 2023-03-30 DIAGNOSIS — S91009A Unspecified open wound, unspecified ankle, initial encounter: Secondary | ICD-10-CM | POA: Insufficient documentation

## 2023-03-30 DIAGNOSIS — I10 Essential (primary) hypertension: Secondary | ICD-10-CM

## 2023-03-30 DIAGNOSIS — Z0000 Encounter for general adult medical examination without abnormal findings: Secondary | ICD-10-CM

## 2023-03-30 DIAGNOSIS — S91002D Unspecified open wound, left ankle, subsequent encounter: Secondary | ICD-10-CM

## 2023-03-30 DIAGNOSIS — J329 Chronic sinusitis, unspecified: Secondary | ICD-10-CM

## 2023-03-30 MED ORDER — LORATADINE 10 MG PO TABS
10.0000 mg | ORAL_TABLET | Freq: Every day | ORAL | 0 refills | Status: DC
Start: 2023-03-30 — End: 2023-10-05

## 2023-03-30 MED ORDER — PROPRANOLOL HCL 40 MG PO TABS: ORAL_TABLET | ORAL | 0 refills | Status: DC

## 2023-03-30 MED ORDER — FLUTICASONE PROPIONATE 50 MCG/ACT NA SUSP
2.0000 | Freq: Every day | NASAL | 2 refills | Status: DC
Start: 2023-03-30 — End: 2024-04-23

## 2023-03-30 NOTE — Patient Instructions (Signed)
DASH Eating Plan DASH stands for Dietary Approaches to Stop Hypertension. The DASH eating plan is a healthy eating plan that has been shown to: Reduce high blood pressure (hypertension). Reduce your risk for type 2 diabetes, heart disease, and stroke. Help with weight loss. What are tips for following this plan? Reading food labels Check food labels for the amount of salt (sodium) per serving. Choose foods with less than 5 percent of the Daily Value of sodium. Generally, foods with less than 300 milligrams (mg) of sodium per serving fit into this eating plan. To find whole grains, look for the word "whole" as the first word in the ingredient list. Shopping Buy products labeled as "low-sodium" or "no salt added." Buy fresh foods. Avoid canned foods and pre-made or frozen meals. Cooking Avoid adding salt when cooking. Use salt-free seasonings or herbs instead of table salt or sea salt. Check with your health care provider or pharmacist before using salt substitutes. Do not fry foods. Cook foods using healthy methods such as baking, boiling, grilling, roasting, and broiling instead. Cook with heart-healthy oils, such as olive, canola, avocado, soybean, or sunflower oil. Meal planning  Eat a balanced diet that includes: 4 or more servings of fruits and 4 or more servings of vegetables each day. Try to fill one-half of your plate with fruits and vegetables. 6-8 servings of whole grains each day. Less than 6 oz (170 g) of lean meat, poultry, or fish each day. A 3-oz (85-g) serving of meat is about the same size as a deck of cards. One egg equals 1 oz (28 g). 2-3 servings of low-fat dairy each day. One serving is 1 cup (237 mL). 1 serving of nuts, seeds, or beans 5 times each week. 2-3 servings of heart-healthy fats. Healthy fats called omega-3 fatty acids are found in foods such as walnuts, flaxseeds, fortified milks, and eggs. These fats are also found in cold-water fish, such as sardines, salmon,  and mackerel. Limit how much you eat of: Canned or prepackaged foods. Food that is high in trans fat, such as some fried foods. Food that is high in saturated fat, such as fatty meat. Desserts and other sweets, sugary drinks, and other foods with added sugar. Full-fat dairy products. Do not salt foods before eating. Do not eat more than 4 egg yolks a week. Try to eat at least 2 vegetarian meals a week. Eat more home-cooked food and less restaurant, buffet, and fast food. Lifestyle When eating at a restaurant, ask that your food be prepared with less salt or no salt, if possible. If you drink alcohol: Limit how much you use to: 0-1 drink a day for women who are not pregnant. 0-2 drinks a day for men. Be aware of how much alcohol is in your drink. In the [place] one drink equals one 12 oz bottle of beer (355 mL), one 5 oz glass of wine (148 mL), or one 1 oz glass of hard liquor (44 mL). General information Avoid eating more than 2,300 mg of salt a day. If you have hypertension, you may need to reduce your sodium intake to 1,500 mg a day. Work with your health care provider to maintain a healthy body weight or to lose weight. Ask what an ideal weight is for you. Get at least 30 minutes of exercise that causes your heart to beat faster (aerobic exercise) most days of the week. Activities may include walking, swimming, or biking. Work with your health care provider or dietitian to   adjust your eating plan to your individual calorie needs. What foods should I eat? Fruits All fresh, dried, or frozen fruit. Canned fruit in natural juice (without added sugar). Vegetables Fresh or frozen vegetables (raw, steamed, roasted, or grilled). Low-sodium or reduced-sodium tomato and vegetable juice. Low-sodium or reduced-sodium tomato sauce and tomato paste. Low-sodium or reduced-sodium canned vegetables. Grains Whole-grain or whole-wheat bread. Whole-grain or whole-wheat pasta. Brown rice. Oatmeal. Quinoa.  Bulgur. Whole-grain and low-sodium cereals. Pita bread. Low-fat, low-sodium crackers. Whole-wheat flour tortillas. Meats and other proteins Skinless chicken or turkey. Ground chicken or turkey. Pork with fat trimmed off. Fish and seafood. Egg whites. Dried beans, peas, or lentils. Unsalted nuts, nut butters, and seeds. Unsalted canned beans. Lean cuts of beef with fat trimmed off. Low-sodium, lean precooked or cured meat, such as sausages or meat loaves. Dairy Low-fat (1%) or fat-free (skim) milk. Reduced-fat, low-fat, or fat-free cheeses. Nonfat, low-sodium ricotta or cottage cheese. Low-fat or nonfat yogurt. Low-fat, low-sodium cheese. Fats and oils Soft margarine without trans fats. Vegetable oil. Reduced-fat, low-fat, or light mayonnaise and salad dressings (reduced-sodium). Canola, safflower, olive, avocado, soybean, and sunflower oils. Avocado. Seasonings and condiments Herbs. Spices. Seasoning mixes without salt. Other foods Unsalted popcorn and pretzels. Fat-free sweets. The items listed above may not be a complete list of foods and beverages you can eat. Contact a dietitian for more information. What foods should I avoid? Fruits Canned fruit in a light or heavy syrup. Fried fruit. Fruit in cream or butter sauce. Vegetables Creamed or fried vegetables. Vegetables in a cheese sauce. Regular canned vegetables (not low-sodium or reduced-sodium). Regular canned tomato sauce and paste (not low-sodium or reduced-sodium). Regular tomato and vegetable juice (not low-sodium or reduced-sodium). Pickles. Olives. Grains Baked goods made with fat, such as croissants, muffins, or some breads. Dry pasta or rice meal packs. Meats and other proteins Fatty cuts of meat. Ribs. Fried meat. Bacon. Bologna, salami, and other precooked or cured meats, such as sausages or meat loaves. Fat from the back of a pig (fatback). Bratwurst. Salted nuts and seeds. Canned beans with added salt. Canned or smoked fish.  Whole eggs or egg yolks. Chicken or turkey with skin. Dairy Whole or 2% milk, cream, and half-and-half. Whole or full-fat cream cheese. Whole-fat or sweetened yogurt. Full-fat cheese. Nondairy creamers. Whipped toppings. Processed cheese and cheese spreads. Fats and oils Butter. Stick margarine. Lard. Shortening. Ghee. Bacon fat. Tropical oils, such as coconut, palm kernel, or palm oil. Seasonings and condiments Onion salt, garlic salt, seasoned salt, table salt, and sea salt. Worcestershire sauce. Tartar sauce. Barbecue sauce. Teriyaki sauce. Soy sauce, including reduced-sodium. Steak sauce. Canned and packaged gravies. Fish sauce. Oyster sauce. Cocktail sauce. Store-bought horseradish. Ketchup. Mustard. Meat flavorings and tenderizers. Bouillon cubes. Hot sauces. Pre-made or packaged marinades. Pre-made or packaged taco seasonings. Relishes. Regular salad dressings. Other foods Salted popcorn and pretzels. The items listed above may not be a complete list of foods and beverages you should avoid. Contact a dietitian for more information. Where to find more information National Heart, Lung, and Blood Institute: www.nhlbi.nih.gov American Heart Association: www.heart.org Academy of Nutrition and Dietetics: www.eatright.org National Kidney Foundation: www.kidney.org Summary The DASH eating plan is a healthy eating plan that has been shown to reduce high blood pressure (hypertension). It may also reduce your risk for type 2 diabetes, heart disease, and stroke. When on the DASH eating plan, aim to eat more fresh fruits and vegetables, whole grains, lean proteins, low-fat dairy, and heart-healthy fats. With the DASH   eating plan, you should limit salt (sodium) intake to 2,300 mg a day. If you have hypertension, you may need to reduce your sodium intake to 1,500 mg a day. Work with your health care provider or dietitian to adjust your eating plan to your individual calorie needs. This information is not  intended to replace advice given to you by your health care provider. Make sure you discuss any questions you have with your health care provider. Document Revised: 11/08/2019 Document Reviewed: 11/08/2019 Elsevier Patient Education  2023 [employer]. Allergic Rhinitis, Adult  Allergic rhinitis is an allergic reaction that affects the mucous membrane inside the nose. The mucous membrane is the tissue that produces mucus. There are two types of allergic rhinitis: Seasonal. This type is also called hay fever and happens only during certain seasons. Perennial. This type can happen at any time of the year. Allergic rhinitis cannot be spread from person to person. This condition can be mild, bad, or very bad. It can develop at any age and may be outgrown. What are the causes? This condition is caused by allergens. These are things that can cause an allergic reaction. Allergens may differ for seasonal allergic rhinitis and perennial allergic rhinitis. Seasonal allergic rhinitis is caused by pollen. Pollen can come from grasses, trees, and weeds. Perennial allergic rhinitis may be caused by: Dust mites. Proteins in a pet's pee (urine), saliva, or dander. Dander is dead skin cells from a pet. Smoke, mold, or car fumes. Remains of or waste from insects such as cockroaches. What increases the risk? You are more likely to develop this condition if you have a family history of allergies or other conditions related to allergies, including: Allergic conjunctivitis. This is irritation and swelling of parts of the eyes and eyelids. Asthma. This condition affects the lungs and makes it hard to breathe. Atopic dermatitis or eczema. This is long term (chronic) irritation and swelling of the skin. Food allergies. What are the signs or symptoms? Symptoms of this condition include: Sneezing or coughing. A stuffy nose (nasal congestion), itchy nose, or nasal discharge. Itchy eyes and tearing of the eyes. A  feeling of mucus dripping down the back of your throat (postnasal drip). This may cause a sore throat. Trouble sleeping. Tiredness. Headache. How is this diagnosed? This condition may be diagnosed with your symptoms, your medical history, and a physical exam. Your health care provider may check for related conditions, such as: Asthma. Pink eye. This is eye swelling and irritation caused by infection (conjunctivitis). Ear infection. Upper respiratory infection. This is an infection in the nose, throat, or upper airways. You may also have tests to find out which allergens cause your symptoms. These may include skin tests or blood tests. How is this treated? There is no cure for this condition, but treatment can help control symptoms. Treatment may include: Taking medicines that block allergy symptoms, such as corticosteroids (anti-inflammatories) and antihistamines. Medicine may be given as a shot, nasal spray, or pill. Avoiding any allergens. Being exposed again and again to tiny amounts of allergens to help you build a defense against allergens (allergenimmunotherapy). This is done if other treatments have not helped. It may include: Allergy shots. These are injected medicines that have small amounts of an allergen in them. Sublingual immunotherapy. This involves taking small doses of a medicine with an allergen in it under your tongue. If these treatments do not work, your provider may prescribe newer, stronger medicines. Follow these instructions at home: Avoiding allergens Find out  what you are allergic to and avoid those allergens. These are some things you can do to help avoid allergens: If you have perennial allergies: Replace carpet with wood, tile, or vinyl flooring. Carpet can trap dander and dust. Do not smoke. Do not allow smoking in your home Change your heating and air conditioning filters at least once a month. If you have seasonal allergies, take these steps during allergy  season: Keep windows closed as much as possible. Plan outdoor activities when pollen counts are lowest. Check pollen counts before you plan outdoor activities When coming indoors, change clothing and shower before sitting on furniture or bedding. If you have a pet in the house that produces allergens: Keep the pet out of the bedroom. Vacuum, sweep, and dust regularly. General instructions Take over-the-counter and prescription medicines only as told by your provider. Drink enough fluid to keep your pee pale yellow. Where to find more information American Academy of Allergy, Asthma & Immunology: aaaai.org Contact a health care provider if: You have a fever. You develop a cough that does not go away. You make high-pitched whistling sounds when you breathe, most often when you breathe out (wheeze). Your symptoms slow you down or stop you from doing your normal activities each day. Get help right away if: You have shortness of breath. This symptom may be an emergency. Get help right away. Call 911. Do not wait to see if the symptoms will go away. Do not drive yourself to the hospital. This information is not intended to replace advice given to you by your health care provider. Make sure you discuss any questions you have with your health care provider. Document Revised: 08/15/2022 Document Reviewed: 08/15/2022 Elsevier Patient Education  2023 [employer].

## 2023-03-30 NOTE — Progress Notes (Signed)
Established Patient Office Visit  Subjective   Patient ID: [name], female    DOB: Aug 09, 1976  Age: 47 y.o. MRN: [id number]  Chief Complaint  Patient presents with   Follow-up    HPI [name] is a 47 y/o female with PMH of alcohol abuse, anxiety, depression and hypertension who follows up today for the chronic wound on her left lateral ankle. She last saw Dr. Geralyn Corwin at [facility] 03/08/23. Her ESR was 28 and her CRP was less than 0.5. There was an open wound with nonviable tissue throughout, increased swelling to the ankle, pain on palpation, but no purulent drainage noted. She completed 3 weeks of oral antibiotics and was referred to Infectious Disease and orthopedic surgery. She was advised tocontinue Vashe wet-to-dry dressings and follows up 4/17. Currently, she states that it is "still hurts constantly." She rates the pain 10/10 at the highest, described as throbbing, aching with numbness and tingling, and left ankle is still swollen, but no erythema nor drainage. Pain is worsened by activity, improved by rest/elevation and medication. She takes 2 ibuprofen 200 mg PO twice daily for her pain. This makes her pain "tolerable" at 6/10.  She was seen by Dr. Lynn Ito at Infectious Disease 03/23/23. There is no active infection but there is edema of both legs, worse left that is causing tenderness. She is advised to use compression stockings or wrap daily, applying first thing in the morning before she gets out of bed and removing at night. Contact ID if any drainage coming from the wound so it can be cultured. No antibiotics at this time. Today she reports no active drainage, and admits she is intermittently adherent to the compression stockings/wrap.  Her BP is again elevated at the visit. She does not check BP at home. She denies headache, chest pain, palpitations, vision changes. Follows DASH diet, but is unable to get exercise due to the pain in her foot.  Currently drinking 2-3 seltzers/day and smoking 4-5 cigarettes/day with no desire to further reduce or quit. She reports taking benadryl and sudafed daily for sinus pressure due to seasonal allergies. She is aware sudafed can elevate her blood pressure. Overall, she is feeling well in general and offers no complaint.    Review of Systems  Constitutional: Negative.   HENT:  Positive for sinus pain (with rhinorrhea).   Eyes:  Negative for blurred vision and double vision.       "Itchy"  Respiratory:  Negative for cough, sputum production and shortness of breath.   Cardiovascular:  Positive for leg swelling. Negative for chest pain and palpitations.  Gastrointestinal: Negative.   Skin:        "Dry"  Neurological:  Positive for tingling (left foot, since December). Negative for tremors and headaches.  Endo/Heme/Allergies:  Positive for environmental allergies.      Objective:     BP (!) 154/83 (BP Location: Right Arm, Patient Position: Sitting, Cuff Size: Normal)   Pulse 72   Temp 98.1 F (36.7 C) (Oral)   Resp 16   Ht 5' (1.524 m)   Wt 150 lb 8 oz (68.3 kg)   LMP 04/23/2019 (Approximate)   SpO2 98%   BMI 29.39 kg/m  BP Readings from Last 3 Encounters:  03/30/23 (!) 154/83  03/23/23 (!) 166/98  01/18/23 (!) 159/96   Wt Readings from Last 3 Encounters:  03/30/23 150 lb 8 oz (68.3 kg)  03/23/23 149 lb (67.6 kg)  01/18/23 146 lb 6.4 oz (  66.4 kg)      Physical Exam Vitals and nursing note reviewed. Exam conducted with a chaperone present.  Constitutional:      General: She is not in acute distress.    Appearance: Normal appearance. She is not ill-appearing.  HENT:     Nose: Congestion and rhinorrhea present.     Mouth/Throat:     Mouth: Mucous membranes are moist.  Eyes:     General: No scleral icterus.       Right eye: No discharge.        Left eye: No discharge.     Conjunctiva/sclera: Conjunctivae normal.     Comments: 2cm long purple bruise present at left upper  eyelid, no swelling.  Cardiovascular:     Rate and Rhythm: Normal rate and regular rhythm.     Pulses: Normal pulses.     Heart sounds: Normal heart sounds. No murmur heard. Pulmonary:     Effort: Pulmonary effort is normal.     Breath sounds: Normal breath sounds. No wheezing, rhonchi or rales.  Musculoskeletal:       Feet:     Comments: Dependent edema BLE to mid calf.  Skin:    General: Skin is warm and dry.     Capillary Refill: Capillary refill takes less than 2 seconds.     Findings: Lesion (1cm x 1cm with scant drainage, left lateral malleolus) present.  Neurological:     General: No focal deficit present.     Mental Status: She is alert and oriented to person, place, and time.     Coordination: Coordination normal.     Gait: Gait normal.  Psychiatric:        Mood and Affect: Mood normal.        Behavior: Behavior normal.      No results found for any visits on 03/30/23.  Last CBC Lab Results  Component Value Date   WBC 5.0 03/23/2023   HGB 10.6 (L) 03/23/2023   HCT 31.1 (L) 03/23/2023   MCV 97.2 03/23/2023   MCH 33.1 03/23/2023   RDW 11.7 03/23/2023   PLT 214 03/23/2023   Last metabolic panel Lab Results  Component Value Date   GLUCOSE 126 (H) 03/23/2023   NA 130 (L) 03/23/2023   K 3.6 03/23/2023   CL 94 (L) 03/23/2023   CO2 24 03/23/2023   BUN 6 03/23/2023   CREATININE 0.51 03/23/2023   GFRNONAA >60 03/23/2023   CALCIUM 8.7 (L) 03/23/2023   PHOS 3.2 01/10/2021   PROT 7.8 03/23/2023   ALBUMIN 3.8 03/23/2023   LABGLOB 5.1 (H) 12/20/2022   AGRATIO 0.7 (L) 12/20/2022   BILITOT 0.7 03/23/2023   ALKPHOS 80 03/23/2023   AST 85 (H) 03/23/2023   ALT 32 03/23/2023   ANIONGAP 12 03/23/2023   Last lipids Lab Results  Component Value Date   CHOL 174 07/28/2021   HDL 53 07/28/2021   LDLCALC 104 (H) 07/28/2021   TRIG 93 07/28/2021   CHOLHDL 3.3 07/28/2021   Last hemoglobin A1c Lab Results  Component Value Date   HGBA1C 5.4 02/05/2020   Last  thyroid functions Lab Results  Component Value Date   TSH 1.740 12/22/2021   Last vitamin D No results found for: "25OHVITD2", "25OHVITD3", "VD25OH" Last vitamin B12 and Folate No results found for: "VITAMINB12", "FOLATE"    The 10-year ASCVD risk score (Arnett DK, et al., 2019) is: 5.3%    Assessment & Plan:  1. Essential hypertension - advised to discontinue sudafed,  track BP at home. Goal is <130/90.  - advised to continue lifestyle modifications, including tobacco and alcohol cessation. - will check at next visit to see if improved, or need to add an agent.  2. Palpitations - stable when on medication. Advised to continue. - propranolol (INDERAL) 40 MG tablet; TAKE 1 TABLET(40 MG) BY MOUTH DAILY  Dispense: 90 tablet; Refill: 0  3. Chronic sinusitis, unspecified location - advised to stop sudafed and benadryl; to start using claritin + flonase daily instead. Educated that this must be taken daily and may take 2 weeks for maximum improvement. Instructed on flonase administration to reduce risk of epistaxis. - loratadine (CLARITIN) 10 MG tablet; Take 1 tablet (10 mg total) by mouth daily.  Dispense: 30 tablet; Refill: 0 - fluticasone (FLONASE) 50 MCG/ACT nasal spray; Place 2 sprays into both nostrils daily.  Dispense: 16 g; Refill: 2  4. Wound of left ankle, subsequent encounter - advised to continue as per Wound Care and Infectious Disease. - reminded to call if she develops fever, drainage, increased swelling or erythema.  - gave reminder of upcoming appointments at wound care 4/17 2:00, and at infectious disease 4/18 9:30. 5. Health care maintenance - due for annual labs.  - strongly encouraged to consider alcohol and tobacco cessation.  - she will need mammogram, pap smear. Next colonoscopy due 2026 unless otherwise indicated by concerning symptoms. - Lipid panel; Future - HgB A1c; Future - B12 and Folate Panel; Future - Iron Binding Cap (TIBC)([facility]);  Future - TSH; Future - Iron Binding Cap (TIBC)([facility]) - B12 and Folate Panel - HgB A1c - Lipid panel - TSH   Return in about 4 weeks (around 04/27/2023), or if symptoms worsen or fail to improve.    Micael HampshireMichelle Graylee Arutyunyan, RN

## 2023-03-31 LAB — B12 AND FOLATE PANEL
Folate: 5.8 ng/mL (ref >3.0)
Vitamin B-12: 490 pg/mL (ref 232–1245)

## 2023-03-31 LAB — LIPID PANEL
Chol/HDL Ratio: 2.2 ratio (ref 0.0–4.4)
Cholesterol, Total: 177 mg/dL (ref 100–199)
HDL: 79 mg/dL (ref >39)
LDL Chol Calc (NIH): 83 mg/dL (ref 0–99)
Triglycerides: 84 mg/dL (ref 0–149)
VLDL Cholesterol Cal: 15 mg/dL (ref 5–40)

## 2023-03-31 LAB — IRON AND TIBC
Iron Saturation: 23 % (ref 15–55)
Iron: 78 ug/dL (ref 27–159)
Total Iron Binding Capacity: 345 ug/dL (ref 250–450)
UIBC: 267 ug/dL (ref 131–425)

## 2023-03-31 LAB — TSH: TSH: 1.38 u[IU]/mL (ref 0.450–4.500)

## 2023-03-31 LAB — HEMOGLOBIN A1C
Est. average glucose Bld gHb Est-mCnc: 114 mg/dL
Hgb A1c MFr Bld: 5.6 % (ref 4.8–5.6)

## 2023-04-05 ENCOUNTER — Ambulatory Visit: Payer: Self-pay | Admitting: Internal Medicine

## 2023-04-06 ENCOUNTER — Ambulatory Visit: Payer: Self-pay | Admitting: Infectious Diseases

## 2023-04-12 ENCOUNTER — Encounter: Payer: Medicaid Other | Attending: Internal Medicine | Admitting: Internal Medicine

## 2023-04-12 DIAGNOSIS — L089 Local infection of the skin and subcutaneous tissue, unspecified: Secondary | ICD-10-CM | POA: Insufficient documentation

## 2023-04-12 DIAGNOSIS — I10 Essential (primary) hypertension: Secondary | ICD-10-CM | POA: Diagnosis not present

## 2023-04-12 DIAGNOSIS — L97528 Non-pressure chronic ulcer of other part of left foot with other specified severity: Secondary | ICD-10-CM | POA: Diagnosis not present

## 2023-04-13 NOTE — Progress Notes (Signed)
[name], [name] ([id number]) [id number] Page 1 of 8 Visit Report for 04/12/2023 Arrival Information Details Patient Name: Date of Service: [name]. 04/12/2023 2:00 PM Medical Record Number: [id number] Patient Account Number: [id number] Date of Birth/Sex: Treating RN: [birth year] (47 y.o. [name] Primary Care [name]: [name] Other Clinician: Betha Rice Referring [name]: Treating [name]/Extender: [name], [name] in Treatment: 10 Visit Information History Since Last Visit All ordered tests and consults were completed: No Patient Arrived: Ambulatory Added or deleted any medications: No Arrival Time: 14:21 Any new allergies or adverse reactions: No Transfer Assistance: None Had a fall or experienced change in No Patient Identification Verified: Yes activities of daily living that may affect Secondary Verification Process Completed: Yes risk of falls: Patient Requires Transmission-Based Precautions: No Signs or symptoms of abuse/neglect since last visito No Patient Has Alerts: Yes Hospitalized since last visit: No Patient Alerts: NOT DIABETIC Implantable device outside of the clinic excluding No cellular tissue based products placed in the center since last visit: Has Dressing in Place as Prescribed: Yes Pain Present Now: Yes Electronic Signature(s) Signed: 04/12/2023 4:32:09 PM By: [name] Entered By: [name] on 04/12/2023 14:22:03 -------------------------------------------------------------------------------- Clinic Level of Care Assessment Details Patient Name: Date of Service: [name] RRA [place], [name] 04/12/2023 2:00 PM Medical Record Number: [id number] Patient Account Number: [id number] Date of Birth/Sex: Treating RN: [name] (47 y.o. [name] Primary Care [name]: [name] Other Clinician: Betha Rice Referring [name]: Treating [name]/Extender:  [name], [name] in Treatment: 10 Clinic Level of Care Assessment Items TOOL 1 Quantity Score []  - 0 Use when EandM and Procedure is performed on INITIAL visit ASSESSMENTS - Nursing Assessment / Reassessment []  - 0 General Physical Exam (combine w/ comprehensive assessment (listed just below) when performed on new pt. evals) []  - 0 Comprehensive Assessment (HX, ROS, Risk Assessments, Wounds Hx, etc.) ASSESSMENTS - Wound and Skin Assessment / Reassessment []  - 0 Dermatologic / Skin Assessment (not related to wound area) ASSESSMENTS - Ostomy and/or Continence Assessment and Care []  - 0 Incontinence Assessment and Management []  - 0 Ostomy Care Assessment and Management (repouching, etc.) PROCESS - Coordination of Care []  - 0 Simple Patient / Family Education for ongoing care []  - 0 Complex (extensive) Patient / Family Education for ongoing care []  - 0 Staff obtains Consents, Records, T Results / Process Orders est []  - 0 Staff telephones HHA, Nursing Homes / Clarify orders / etc []  - 0 Routine Transfer to another Facility (non-emergent condition) []  - 0 Routine Hospital Admission (non-emergent condition) [name], [name] ([id number]) [id number] Page 2 of 8 []  - 0 New Admissions / Manufacturing engineer / Ordering NPWT Apligraf, etc. , []  - 0 Emergency Hospital Admission (emergent condition) PROCESS - Special Needs []  - 0 Pediatric / Minor Patient Management []  - 0 Isolation Patient Management []  - 0 Hearing / Language / Visual special needs []  - 0 Assessment of Community assistance (transportation, D/C planning, etc.) []  - 0 Additional assistance / Altered mentation []  - 0 Support Surface(s) Assessment (bed, cushion, seat, etc.) INTERVENTIONS - Miscellaneous []  - 0 External ear exam []  - 0 Patient Transfer (multiple staff / Nurse, adult / Similar devices) []  - 0 Simple Staple / Suture removal (25 or less) []   - 0 Complex Staple / Suture removal (26 or more) []  - 0 Hypo/Hyperglycemic Management (do not check if billed separately) []  - 0 Ankle / Brachial Index (ABI) -  do not check if billed separately Has the patient been seen at the hospital within the last three years: Yes Total Score: 0 Level Of Care: ____ Electronic Signature(s) Signed: 04/12/2023 4:32:09 PM By: [name] Entered By: [name] on 04/12/2023 14:44:36 -------------------------------------------------------------------------------- Encounter Discharge Information Details Patient Name: Date of Service: [name] RRA [place], [name] 04/12/2023 2:00 PM Medical Record Number: [id number] Patient Account Number: [id number] Date of Birth/Sex: Treating RN: [name] (47 y.o. [name] Primary Care [name]: [name] Other Clinician: Betha Rice Referring [name]: Treating [name]/Extender: [name], [name] in Treatment: 10 Encounter Discharge Information Items Post Procedure Vitals Discharge Condition: Stable Temperature (F): 98.4 Ambulatory Status: Ambulatory Pulse (bpm): 84 Discharge Destination: Home Respiratory Rate (breaths/min): 16 Transportation: Private Auto Blood Pressure (mmHg): 172/100 Accompanied By: self Schedule Follow-up Appointment: Yes Clinical Summary of Care: Electronic Signature(s) Signed: 04/12/2023 4:32:09 PM By: [name] Entered By: [name] on 04/12/2023 15:34:21 -------------------------------------------------------------------------------- Lower Extremity Assessment Details Patient Name: Date of Service: [name] RRA [place], [name] 04/12/2023 2:00 PM Medical Record Number: [id number] Patient Account Number: [id number] Date of Birth/Sex: Treating RN: [birth year] (47 y.o. [name] Primary Care Jaielle Dlouhy: [name] Other Clinician: Betha Rice Referring [name]: Treating [name]/Extender: [name],  [name] in Treatment: 10 Edema Assessment C[LeftKAROLYN, [name] ([id number])] [Right: [id number] Page 3 of 8] Assessed: [Left: Yes] [Right: No] Edema: [Left: Ye] [Right: s] Calf Left: Right: Point of Measurement: 29 cm From Medial Instep 36.4 cm Ankle Left: Right: Point of Measurement: 12 cm From Medial Instep 22.4 cm Vascular Assessment Pulses: Dorsalis Pedis Palpable: [Left:Yes] Electronic Signature(s) Signed: 04/12/2023 4:32:09 PM By: [name] Signed: 04/12/2023 4:54:10 PM By: Elliot Gurney, BSN, RN, CWS, Kim RN, BSN Entered By: [name] on 04/12/2023 14:32:32 -------------------------------------------------------------------------------- Multi Wound Chart Details Patient Name: Date of Service: [name] RRA [place], [name] 04/12/2023 2:00 PM Medical Record Number: [id number] Patient Account Number: [id number] Date of Birth/Sex: Treating RN: [birth year] (46 y.o. [name] Primary Care Ramesses Crampton: [name] Other Clinician: Betha Rice Referring Shadawn Hanaway: Treating Vane Yapp/Extender: [name], [name] in Treatment: 10 Vital Signs Height(in): 60 Pulse(bpm): 81 Weight(lbs): 145 Blood Pressure(mmHg): 172/100 Body Mass Index(BMI): 28.3 Temperature(F): 98.4 Respiratory Rate(breaths/min): 16 [1:Photos:] [N/A:N/A] Left, Lateral Ankle N/A N/A Wound Location: Not Known N/A N/A Wounding Event: Cellulitis N/A N/A Primary Etiology: Hypertension N/A N/A Comorbid History: 01/19/2023 N/A N/A Date Acquired: 10 N/A N/A Rice of Treatment: Open N/A N/A Wound Status: No N/A N/A Wound Recurrence: 0.8x0.8x0.1 N/A N/A Measurements L x W x D (cm) 0.503 N/A N/A A (cm) : rea 0.05 N/A N/A Volume (cm) : 46.60% N/A N/A % Reduction in A rea: 94.10% N/A N/A % Reduction in Volume: Unclassifiable N/A N/A Classification: Large N/A N/A Exudate A mount: Purulent N/A N/A Exudate Type: yellow, brown, green N/A  N/A Exudate Color: None Present (0%) N/A N/A Granulation A mount: Large (67-100%) N/A N/A Necrotic A mount: Fat Layer (Subcutaneous Tissue): Yes N/A N/A Exposed Structures: Fascia: No Tendon: No Muscle: No [name], [name] ([id number]) [id number] Page 4 of 8 Joint: No Bone: No None N/A N/A Epithelialization: Treatment Notes Electronic Signature(s) Signed: 04/12/2023 4:32:09 PM By: [name] Entered By: [name] on 04/12/2023 14:32:36 -------------------------------------------------------------------------------- Multi-Disciplinary Care Plan Details Patient Name: Date of Service: [name] RRA [place], [name] 04/12/2023 2:00 PM Medical Record Number: [id number] Patient Account Number: [id number] Date of Birth/Sex: Treating RN: [birth year] Rice (46 y.o. [name] Primary  Care Kristyne Woodring: [name] Other Clinician: Betha Rice Referring Jose Alleyne: Treating Betzaira Mentel/Extender: [name], [name] in Treatment: 10 Active Inactive Necrotic Tissue Nursing Diagnoses: Impaired tissue integrity related to necrotic/devitalized tissue Knowledge deficit related to management of necrotic/devitalized tissue Goals: Necrotic/devitalized tissue will be minimized in the wound bed Date Initiated: 02/01/2023 Target Resolution Date: 03/02/2023 Goal Status: Active Patient/caregiver will verbalize understanding of reason and process for debridement of necrotic tissue Date Initiated: 02/01/2023 Target Resolution Date: 03/02/2023 Goal Status: Active Interventions: Assess patient pain level pre-, during and post procedure and prior to discharge Provide education on necrotic tissue and debridement process Treatment Activities: Enzymatic debridement : 02/01/2023 Notes: Orientation to the Wound Care Program Nursing Diagnoses: Knowledge deficit related to the wound healing center program Goals: Patient/caregiver will verbalize understanding  of the Wound Healing Center Program Date Initiated: 02/01/2023 Target Resolution Date: 02/15/2023 Goal Status: Active Interventions: Provide education on orientation to the wound center Notes: Wound/Skin Impairment Nursing Diagnoses: Impaired tissue integrity Knowledge deficit related to smoking impact on wound healing Knowledge deficit related to ulceration/compromised skin integrity Goals: Patient will demonstrate a reduced rate of smoking or cessation of smoking Date Initiated: 02/01/2023 Target Resolution Date: 03/02/2023 Goal Status: Active NIKITHA, MODE ([id number]) [id number] Page 5 of 8 Patient will have a decrease in wound volume by X% from date: (specify in notes) Date Initiated: 02/01/2023 Target Resolution Date: 03/02/2023 Goal Status: Active Patient/caregiver will verbalize understanding of skin care regimen Date Initiated: 02/01/2023 Target Resolution Date: 02/17/2023 Goal Status: Active Ulcer/skin breakdown will have a volume reduction of 30% by week 4 Date Initiated: 02/01/2023 Target Resolution Date: 03/02/2023 Goal Status: Active Ulcer/skin breakdown will have a volume reduction of 50% by week 8 Date Initiated: 02/01/2023 Target Resolution Date: 04/02/2023 Goal Status: Active Ulcer/skin breakdown will have a volume reduction of 80% by week 12 Date Initiated: 02/01/2023 Target Resolution Date: 05/02/2023 Goal Status: Active Interventions: Assess patient/caregiver ability to obtain necessary supplies Assess patient/caregiver ability to perform ulcer/skin care regimen upon admission and as needed Assess ulceration(s) every visit Provide education on smoking Provide education on ulcer and skin care Treatment Activities: Skin care regimen initiated : 02/01/2023 Smoking cessation education : 02/01/2023 Notes: Electronic Signature(s) Signed: 04/12/2023 4:32:09 PM By: [name] Signed: 04/12/2023 4:54:10 PM By: Elliot Gurney, BSN, RN, CWS, Kim RN,  BSN Entered By: [name] on 04/12/2023 14:48:31 -------------------------------------------------------------------------------- Pain Assessment Details Patient Name: Date of Service: [name] RRA [place], [name] 04/12/2023 2:00 PM Medical Record Number: [id number] Patient Account Number: [id number] Date of Birth/Sex: Treating RN: [birth year] (46 y.o. [name] Primary Care Jlyn Cerros: [name] Other Clinician: Betha Rice Referring Karliah Kowalchuk: Treating Jerod Mcquain/Extender: [name], [name] in Treatment: 10 Active Problems Location of Pain Severity and Description of Pain Patient Has Paino Yes Site Locations Pain Location: Pain in Ulcers Duration of the Pain. Constant / Intermittento Constant Rate the pain. Current Pain Level: 6 Character of Pain Describe the Pain: Burning Pain Management and Medication Current Pain Management: [name], [name] ([id number]) [id number] Page 6 of 8 Medication: Yes Cold Application: No Rest: No Massage: No Activity: No T.E.N.S.: No Heat Application: No Leg drop or elevation: No Is the Current Pain Management Adequate: Inadequate How does your wound impact your activities of daily livingo Sleep: No Bathing: No Appetite: No Relationship With Others: No Bladder Continence: No Emotions: No Bowel Continence: No Work: No Toileting: No Drive: No Dressing: No Hobbies: No Electronic Signature(s) Signed: 04/12/2023 4:32:09 PM By: [name] Signed: 04/12/2023  4:54:10 PM By: Elliot Gurney, BSN, RN, CWS, Kim RN, BSN Entered By: [name] on 04/12/2023 14:25:36 -------------------------------------------------------------------------------- Patient/Caregiver Education Details Patient Name: Date of Service: [name] RRA [place] 4/24/2024andnbsp2:00 PM Medical Record Number: [id number] Patient Account Number: [id number] Date of Birth/Gender: Treating RN: [birth year] (47 y.o. [name] Primary Care Physician: [name] Other Clinician: Betha Rice Referring Physician: Treating Physician/Extender: Livia Snellen Rice in Treatment: 10 Education Assessment Education Provided To: Patient Education Topics Provided Wound/Skin Impairment: Handouts: Other: continue wound care as directed Methods: Explain/Verbal Responses: State content correctly Electronic Signature(s) Signed: 04/12/2023 4:32:09 PM By: [name] Entered By: [name] on 04/12/2023 15:33:30 -------------------------------------------------------------------------------- Wound Assessment Details Patient Name: Date of Service: [name] RRA [place], [name] 04/12/2023 2:00 PM Medical Record Number: [id number] Patient Account Number: [id number] Date of Birth/Sex: Treating RN: [birth year] (46 y.o. [name] Primary Care Kimoni Pagliarulo: [name] Other Clinician: Betha Rice Referring Alvilda Mckenna: Treating Najee Cowens/Extender: [name], [name] in Treatment: 10 Wound Status Wound Number: 1 Primary Etiology: Cellulitis Wound Location: Left, Lateral Ankle Wound Status: Open Wounding Event: Not Known Comorbid History: Hypertension Date Acquired: 01/19/2023 Rice Of Treatment: 10 Clustered Wound: No Photos SHONTAE, ROSILES ([id number]) [id number] Page 7 of 8 Wound Measurements Length: (cm) 0.8 Width: (cm) 0.8 Depth: (cm) 0.1 Area: (cm) 0.503 Volume: (cm) 0.05 % Reduction in Area: 46.6% % Reduction in Volume: 94.1% Epithelialization: None Wound Description Classification: Unclassifiable Exudate Amount: Large Exudate Type: Purulent Exudate Color: yellow, brown, green Foul Odor After Cleansing: No Slough/Fibrino Yes Wound Bed Granulation Amount: None Present (0%) Exposed Structure Necrotic Amount: Large (67-100%) Fascia Exposed: No Necrotic Quality: Adherent Slough Fat Layer (Subcutaneous  Tissue) Exposed: Yes Tendon Exposed: No Muscle Exposed: No Joint Exposed: No Bone Exposed: No Treatment Notes Wound #1 (Ankle) Wound Laterality: Left, Lateral Cleanser Vashe 5.8 (oz) Discharge Instruction: Use vashe 5.8 (oz) as directed Peri-Wound Care Topical Bacitracin Ointment, 1 (oz) tube Discharge Instruction: over the counter Primary Dressing (BORDER) Zetuvit Plus Silicone Border Dressing 4x4 (in/in) Secondary Dressing Secured With ACE WRAP - 39M ACE Elastic Bandage With VELCRO Brand Closure, 4 (in) Compression Wrap Compression Stockings Add-Ons Electronic Signature(s) Signed: 04/12/2023 4:32:09 PM By: [name] Signed: 04/12/2023 4:54:10 PM By: Elliot Gurney, BSN, RN, CWS, Kim RN, BSN Entered By: [name] on 04/12/2023 14:31:34 -------------------------------------------------------------------------------- Vitals Details Patient Name: Date of Service: [name] RRA [place], [name] 04/12/2023 2:00 PM Medical Record Number: [id number] Patient Account Number: [id number] Date of Birth/Sex: Treating RN: [birth year]ice (46 y.o. [name] [place], [place] ([id number]) [id number] Page 8 of 8 Primary Care Kimiya Brunelle: [name] Other Clinician: Betha Rice Referring Aleks Nawrot: Treating Tannar Broker/Extender: [name], [name] in Treatment: 10 Vital Signs Time Taken: 14:22 Temperature (F): 98.4 Height (in): 60 Pulse (bpm): 81 Weight (lbs): 145 Respiratory Rate (breaths/min): 16 Body Mass Index (BMI): 28.3 Blood Pressure (mmHg): 172/100 Reference Range: 80 - 120 mg / dl Electronic Signature(s) Signed: 04/12/2023 4:32:09 PM By: [name] Entered By: [name] on 04/12/2023 14:25:30

## 2023-04-13 NOTE — Progress Notes (Signed)
[name], [name] ([id number]) [id number] Page 1 of 6 Visit Report for 04/12/2023 Chief Complaint Document Details Patient Name: Date of Service: [place] RRA [place], [name]. 04/12/2023 2:00 PM Medical Record Number: [id number] Patient Account Number: [id number] Date of Birth/Sex: Treating RN: July 11, 1976 (47 y.o. [name] Primary Care Provider: [name] Other Clinician: Betha Loa Referring Provider: Treating Provider/Extender: Gilford Rile, [name] Weeks in Treatment: 10 Information Obtained from: Patient Chief Complaint 02/01/2023; Left ankle wound Electronic Signature(s) Signed: 04/12/2023 3:43:42 PM By: Geralyn Corwin DO Entered By: Geralyn Corwin on 04/12/2023 14:59:29 -------------------------------------------------------------------------------- Debridement Details Patient Name: Date of Service: [place] RRA [place], [place] [name] 04/12/2023 2:00 PM Medical Record Number: [id number] Patient Account Number: [id number] Date of Birth/Sex: Treating RN: July 21, 1976 (46 y.o. [name] Primary Care Provider: [name] Other Clinician: Betha Loa Referring Provider: Treating Provider/Extender: Gilford Rile, [name] Weeks in Treatment: 10 Debridement Performed for Assessment: Wound #1 Left,Lateral Ankle Performed By: Physician Geralyn Corwin, MD Debridement Type: Debridement Level of Consciousness (Pre-procedure): Awake and Alert Pre-procedure Verification/Time Out Yes - 14:33 Taken: Start Time: 14:33 Percent of Wound Bed Debrided: 100% T Area Debrided (cm): otal 0.5 Tissue and other material debrided: Viable, Non-Viable, Slough, Skin: Dermis , Skin: Epidermis, Slough Level: Skin/Epidermis Debridement Description: Selective/Open Wound Instrument: Curette Bleeding: None Response to Treatment: Procedure was tolerated well Level of Consciousness (Post- Awake and Alert procedure): Post Debridement  Measurements of Total Wound Length: (cm) 0.8 Width: (cm) 0.8 Depth: (cm) 0.2 Volume: (cm) 0.101 Character of Wound/Ulcer Post Debridement: Stable Post Procedure Diagnosis Same as Pre-procedure Electronic Signature(s) Signed: 04/12/2023 3:43:42 PM By: Geralyn Corwin DO Signed: 04/12/2023 4:32:09 PM By: Betha Loa Signed: 04/12/2023 4:54:10 PM By: Elliot Gurney, BSN, RN, CWS, Kim RN, BSN Entered By: Betha Loa on 04/12/2023 14:38:59 [name] ([id number]) [id number] Page 2 of 6 -------------------------------------------------------------------------------- HPI Details Patient Name: Date of Service: [place] RRA [place], [name]. 04/12/2023 2:00 PM Medical Record Number: [id number] Patient Account Number: [id number] Date of Birth/Sex: Treating RN: June 19, 1976 (47 y.o. [name] Primary Care Provider: [name] Other Clinician: Betha Loa Referring Provider: Treating Provider/Extender: Gilford Rile, [name] Weeks in Treatment: 10 History of Present Illness HPI Description: 02/01/2023 [name] is a 47 year old female with a past medical history of alcohol/drug abuse and hypertension that presents to the clinic for a 27-month history of nonhealing ulcer to the left lateral ankle. She states it started out as cellulitis And subsequently developed a wound to the area. She has tried Keflex, doxycycline and Levaquin for this issue. She reports that the erythema had improved however still has chronic pain to the wound site with increased swelling to the ankle. She has been keeping the area covered. 2/28; patient missed her last clinic appointment. She has been taking doxycycline and Augmentin and states she has a few days left. She had an x-ray done at last clinic visit that showed potential subtle periosteal reaction to the lateral shaft of the fibula that could be consistent with osteomyelitis. An MRI was ordered and she did  obtain this however results have not come through. She has been using Vashe wet-to-dry dressings. Overall her symptoms are stable. 3/6; patient presents for follow-up. She had an MRI completed of her left ankle. Results were not conclusive. It noted trace subcortical marrow edema within the distal lateral fibular metaphyseal/epiphyseal region with possible minimal cortical thinning. This may be reactive and noninfected however cannot exclude very early osteomyelitis. No tendon tear  or abscess noted . She has been using Vashe wet-to-dry dressings. 3/20; patient presents for follow-up. Sed rate was 28 and C-reactive protein was less than 0.5. Patient is still taking her oral antibiotics. She has been using Vashe wet-to-dry dressings. 4/24; patient was referred to infectious disease and orthopedic surgery at last clinic visit. She saw Dr. Joylene Draft, ID on 03/23/2023. Nothing further to do from an ID point of view. Unfortunately she has not heard back from orthopedic surgery and has not gone into the walk-in [facility] clinic as recommended. She has been using antibiotic ointment to the area and keeping it covered. She reports no changes in the status of the wound or swelling. Electronic Signature(s) Signed: 04/12/2023 3:43:42 PM By: Geralyn Corwin DO Entered By: Geralyn Corwin on 04/12/2023 15:01:24 -------------------------------------------------------------------------------- Physical Exam Details Patient Name: Date of Service: [place] RRA [place], [place] [name] 04/12/2023 2:00 PM Medical Record Number: [id number] Patient Account Number: [id number] Date of Birth/Sex: Treating RN: 02-Jun-1976 (47 y.o. [name] Primary Care Provider: [name] Other Clinician: Betha Loa Referring Provider: Treating Provider/Extender: Gilford Rile, [name] Weeks in Treatment: 10 Constitutional . Cardiovascular . Psychiatric . Notes Left foot: T the lateral ankle there is an open wound  with nonviable tissue throughout. Increased swelling to the ankle. Pain on palpation. No purulent drainage o noted. Electronic Signature(s) Signed: 04/12/2023 3:43:42 PM By: Geralyn Corwin DO Entered By: Geralyn Corwin on 04/12/2023 15:01:50 -------------------------------------------------------------------------------- Physician Orders Details Patient Name: Date of Service: [place] RRA [place], [place] [name] 04/12/2023 2:00 PM Medical Record Number: [id number] Patient Account Number: [id number] Date of Birth/Sex: Treating RN: [birth year] (46 y.o. [name] Primary Care Provider: [name] Other Clinician: Betha Loa Referring Provider: Treating Provider/Extender: Livia Snellen Pooler ([id number]) [id number] Page 3 of 6 Weeks in Treatment: 10 Verbal / Phone Orders: Yes Clinician: Huel Coventry Read Back and Verified: Yes Diagnosis Coding Follow-up Appointments Return Appointment in 3 weeks. Nurse Visit as needed Bathing/ Shower/ Hygiene Clean wound with Normal Saline or wound cleanser. - Vashe Anesthetic (Use 'Patient Medications' Section for Anesthetic Order Entry) Lidocaine applied to wound bed Wound Treatment Wound #1 - Ankle Wound Laterality: Left, Lateral Cleanser: Vashe 5.8 (oz) 1 x Per Day/30 Days Discharge Instructions: Use vashe 5.8 (oz) as directed Topical: Bacitracin Ointment, 1 (oz) tube 1 x Per Day/30 Days Discharge Instructions: over the counter Prim Dressing: (BORDER) Zetuvit Plus Silicone Border Dressing 4x4 (in/in) ary 1 x Per Day/30 Days Secured With: ACE WRAP - 20M ACE Elastic Bandage With VELCRO Brand Closure, 4 (in) 1 x Per Day/30 Days Consults Podiatry - refer to [facility] Electronic Signature(s) Signed: 04/12/2023 3:43:42 PM By: Geralyn Corwin DO Entered By: Geralyn Corwin on 04/12/2023  15:04:17 -------------------------------------------------------------------------------- Problem List Details Patient Name: Date of Service: [place] RRA [place], [place] [name] 04/12/2023 2:00 PM Medical Record Number: [id number] Patient Account Number: [id number] Date of Birth/Sex: Treating RN: 1976/06/20 (46 y.o. [name] Primary Care Provider: [name] Other Clinician: Betha Loa Referring Provider: Treating Provider/Extender: Gilford Rile, [name] Weeks in Treatment: 10 Active Problems ICD-10 Encounter Code Description Active Date MDM Diagnosis L97.528 Non-pressure chronic ulcer of other part of left foot with other specified 02/01/2023 No Yes severity L08.9 Local infection of the skin and subcutaneous tissue, unspecified 02/01/2023 No Yes Inactive Problems Resolved Problems Electronic Signature(s) Signed: 04/12/2023 3:43:42 PM By: Geralyn Corwin DO Entered By: Geralyn Corwin on 04/12/2023 14:59:26 [name] ([id number]) [id number] Page 4  of 6 -------------------------------------------------------------------------------- Progress Note Details Patient Name: Date of Service: [place] RRA [place], [name]. 04/12/2023 2:00 PM Medical Record Number: [id number] Patient Account Number: [id number] Date of Birth/Sex: Treating RN: 14-Apr-1976 (47 y.o. [name] Primary Care Provider: [name] Other Clinician: Betha Loa Referring Provider: Treating Provider/Extender: Gilford Rile, [name] Weeks in Treatment: 10 Subjective Chief Complaint Information obtained from Patient 02/01/2023; Left ankle wound History of Present Illness (HPI) 02/01/2023 [name] is a 47 year old female with a past medical history of alcohol/drug abuse and hypertension that presents to the clinic for a 3-month history of nonhealing ulcer to the left lateral ankle. She states it started out as cellulitis And subsequently  developed a wound to the area. She has tried Keflex, doxycycline and Levaquin for this issue. She reports that the erythema had improved however still has chronic pain to the wound site with increased swelling to the ankle. She has been keeping the area covered. 2/28; patient missed her last clinic appointment. She has been taking doxycycline and Augmentin and states she has a few days left. She had an x-ray done at last clinic visit that showed potential subtle periosteal reaction to the lateral shaft of the fibula that could be consistent with osteomyelitis. An MRI was ordered and she did obtain this however results have not come through. She has been using Vashe wet-to-dry dressings. Overall her symptoms are stable. 3/6; patient presents for follow-up. She had an MRI completed of her left ankle. Results were not conclusive. It noted trace subcortical marrow edema within the distal lateral fibular metaphyseal/epiphyseal region with possible minimal cortical thinning. This may be reactive and noninfected however cannot exclude very early osteomyelitis. No tendon tear or abscess noted . She has been using Vashe wet-to-dry dressings. 3/20; patient presents for follow-up. Sed rate was 28 and C-reactive protein was less than 0.5. Patient is still taking her oral antibiotics. She has been using Vashe wet-to-dry dressings. 4/24; patient was referred to infectious disease and orthopedic surgery at last clinic visit. She saw Dr. Joylene Draft, ID on 03/23/2023. Nothing further to do from an ID point of view. Unfortunately she has not heard back from orthopedic surgery and has not gone into the walk-in [facility] clinic as recommended. She has been using antibiotic ointment to the area and keeping it covered. She reports no changes in the status of the wound or swelling. Objective Constitutional Vitals Time Taken: 2:22 PM, Height: 60 in, Weight: 145 lbs, BMI: 28.3, Temperature: 98.4 F, Pulse: 81 bpm,  Respiratory Rate: 16 breaths/min, Blood Pressure: 172/100 mmHg. General Notes: Left foot: T the lateral ankle there is an open wound with nonviable tissue throughout. Increased swelling to the ankle. Pain on palpation. No o purulent drainage noted. Integumentary (Hair, Skin) Wound #1 status is Open. Original cause of wound was Not Known. The date acquired was: 01/19/2023. The wound has been in treatment 10 weeks. The wound is located on the Left,Lateral Ankle. The wound measures 0.8cm length x 0.8cm width x 0.1cm depth; 0.503cm^2 area and 0.05cm^3 volume. There is Fat Layer (Subcutaneous Tissue) exposed. There is a large amount of purulent drainage noted. There is no granulation within the wound bed. There is a large (67-100%) amount of necrotic tissue within the wound bed including Adherent Slough. Assessment Active Problems ICD-10 Non-pressure chronic ulcer of other part of left foot with other specified severity Local infection of the skin and subcutaneous tissue, unspecified Patient's wound is stable. No signs of active infection. She  still has significant swelling on exam. Pain on palpation. I debrided nonviable tissue and recommended continuing with antibiotic ointment daily. At this time I recommended a referral to podiatry. There is nothing further to offer from a wound care point of view. Procedures [name], [name] ([id number]) [id number] Page 5 of 6 Wound #1 Pre-procedure diagnosis of Wound #1 is a Cellulitis located on the Left,Lateral Ankle . There was a Selective/Open Wound Skin/Epidermis Debridement with a total area of 0.5 sq cm performed by Geralyn Corwin, MD. With the following instrument(s): Curette to remove Viable and Non-Viable tissue/material. Material removed includes Slough, Skin: Dermis, and Skin: Epidermis. A time out was conducted at 14:33, prior to the start of the procedure. There was no bleeding. The procedure was tolerated well.  Post Debridement Measurements: 0.8cm length x 0.8cm width x 0.2cm depth; 0.101cm^3 volume. Character of Wound/Ulcer Post Debridement is stable. Post procedure Diagnosis Wound #1: Same as Pre-Procedure Plan Follow-up Appointments: Return Appointment in 3 weeks. Nurse Visit as needed Bathing/ Shower/ Hygiene: Clean wound with Normal Saline or wound cleanser. - Vashe Anesthetic (Use 'Patient Medications' Section for Anesthetic Order Entry): Lidocaine applied to wound bed Consults ordered were: Podiatry - refer to [facility] WOUND #1: - Ankle Wound Laterality: Left, Lateral Cleanser: Vashe 5.8 (oz) 1 x Per Day/30 Days Discharge Instructions: Use vashe 5.8 (oz) as directed Topical: Bacitracin Ointment, 1 (oz) tube 1 x Per Day/30 Days Discharge Instructions: over the counter Prim Dressing: (BORDER) Zetuvit Plus Silicone Border Dressing 4x4 (in/in) 1 x Per Day/30 Days ary Secured With: ACE WRAP - 45M ACE Elastic Bandage With VELCRO Brand Closure, 4 (in) 1 x Per Day/30 Days 1. Antibiotic ointment 2. Referral to podiatry 3. Follow-up as needed Electronic Signature(s) Signed: 04/12/2023 3:43:42 PM By: Geralyn Corwin DO Entered By: Geralyn Corwin on 04/12/2023 15:03:59 -------------------------------------------------------------------------------- ROS/PFSH Details Patient Name: Date of Service: [place] RRA [place], [place] [name] 04/12/2023 2:00 PM Medical Record Number: [id number] Patient Account Number: [id number] Date of Birth/Sex: Treating RN: August 25, 1976 (46 y.o. [name] Primary Care Provider: [name] Other Clinician: Betha Loa Referring Provider: Treating Provider/Extender: Gilford Rile, [name] Weeks in Treatment: 10 Information Obtained From Patient Eyes Medical History: Negative for: Cataracts; Glaucoma; Optic Neuritis Cardiovascular Medical History: Positive for: Hypertension Psychiatric Medical History: Negative for: Anorexia/bulimia;  Confinement Anxiety Immunizations Pneumococcal Vaccine: Received Pneumococcal Vaccination: No Rice, [name] ([id number]) [id number] Page 6 of 6 Implantable Devices None Family and Social History Current every day smoker - 5 cig/day; Marital Status - Divorced; Alcohol Use: Moderate - couple times week; Drug Use: No History; Caffeine Use: Rarely Electronic Signature(s) Signed: 04/12/2023 3:43:42 PM By: Geralyn Corwin DO Signed: 04/12/2023 4:54:10 PM By: Elliot Gurney, BSN, RN, CWS, Kim RN, BSN Entered By: Geralyn Corwin on 04/12/2023 15:04:24 -------------------------------------------------------------------------------- SuperBill Details Patient Name: Date of Service: CA RRA [place], [name]. 04/12/2023 Medical Record Number: [id number] Patient Account Number: [id number] Date of Birth/Sex: Treating RN: 1976/10/10 (46 y.o. [name] Primary Care Provider: [name] Other Clinician: Betha Loa Referring Provider: Treating Provider/Extender: Gilford Rile, [name] Weeks in Treatment: 10 Diagnosis Coding ICD-10 Codes Code Description [phone] Non-pressure chronic ulcer of other part of left foot with other specified severity L08.9 Local infection of the skin and subcutaneous tissue, unspecified Facility Procedures : CPT4 Code: [id number] Description: 97597 - DEBRIDE WOUND 1ST 20 SQ CM OR < ICD-10 Diagnosis Description L97.528 Non-pressure chronic ulcer of other part of left foot with other specified severi Modifier: ty  Quantity: 1 Physician Procedures : CPT4 Code Description Modifier [id number] 97597 - WC PHYS DEBR WO ANESTH 20 SQ CM ICD-10 Diagnosis Description L97.528 Non-pressure chronic ulcer of other part of left foot with other specified severity Quantity: 1 Electronic Signature(s) Signed: 04/12/2023 3:43:42 PM By: Geralyn Corwin DO Entered By: Geralyn Corwin on 04/12/2023 15:04:11

## 2023-04-25 ENCOUNTER — Ambulatory Visit: Payer: Self-pay | Admitting: Infectious Diseases

## 2023-04-27 ENCOUNTER — Other Ambulatory Visit: Payer: Self-pay

## 2023-04-27 ENCOUNTER — Ambulatory Visit: Payer: Self-pay | Admitting: Gerontology

## 2023-04-27 ENCOUNTER — Encounter: Payer: Self-pay | Admitting: Gerontology

## 2023-04-27 VITALS — BP 146/90 | HR 80 | Temp 98.0°F | Resp 16 | Ht 60.0 in | Wt 147.4 lb

## 2023-04-27 DIAGNOSIS — F1010 Alcohol abuse, uncomplicated: Secondary | ICD-10-CM

## 2023-04-27 DIAGNOSIS — R002 Palpitations: Secondary | ICD-10-CM

## 2023-04-27 DIAGNOSIS — S91002D Unspecified open wound, left ankle, subsequent encounter: Secondary | ICD-10-CM

## 2023-04-27 MED ORDER — PROPRANOLOL HCL 40 MG PO TABS
ORAL_TABLET | ORAL | 0 refills | Status: DC
Start: 2023-04-27 — End: 2023-10-05

## 2023-04-27 NOTE — Patient Instructions (Signed)

## 2023-04-27 NOTE — Progress Notes (Signed)
Established Patient Office Visit  Subjective   Patient ID: [name], female    DOB: 1976/03/26  Age: 47 y.o. MRN: [id number]  Chief Complaint  Patient presents with   Follow-up    Patient's last visit. She has active Medicaid now.     HPI  [name] is a 47 y/o female with PMH of alcohol abuse, anxiety, depression and hypertension who follows up today for the chronic wound on her left lateral ankle .  She is compliant with her medications, denies side effects and continues to make healthy lifestyle changes. She drinks 3-4 seltzers an alcoholic beverage every other day. She was seen at the wound clinic on 04/12/23 by Dr Mikey Bussing Pre-procedure diagnosis of Wound #1 is a Cellulitis located on the Left,Lateral Ankle . There was a Selective/Open Wound Skin/Epidermis Debridement with a total area of 0.5 sq cm performed by Geralyn Corwin, MD. With the following instrument(s): Curette to remove Viable and Non-Viable tissue/material. Material removed includes Slough, Skin: Dermis, and Skin: Epidermis. A time out was conducted at 14:33, prior to the start of the procedure. There was no bleeding. The procedure was tolerated well. Post Debridement Measurements: 0.8cm length x 0.8cm width x 0.2cm depth; 0.101cm^3 volume. Character of Wound/Ulcer Post Debridement is stable. Post procedure Diagnosis Wound #1: Same as Pre-Procedure Plan Follow-up Appointments: Return Appointment in 3 weeks. Nurse Visit as needed Bathing/ Shower/ Hygiene: Clean wound with Normal Saline or wound cleanser. - Vashe Anesthetic (Use 'Patient Medications' Section for Anesthetic Order Entry): Lidocaine applied to wound bed Consults ordered were: Podiatry - refer to [facility] WOUND #1: - Ankle Wound Laterality: Left, Lateral Cleanser: Vashe 5.8 (oz) 1 x Per Day/30 Days Discharge Instructions: Use vashe 5.8 (oz) as directed Topical: Bacitracin Ointment, 1 (oz) tube 1 x Per Day/30 Days Discharge  Instructions: over the counter Prim Dressing: (BORDER) Zetuvit Plus Silicone Border Dressing 4x4 (in/in) 1 x Per Day/30 Days ary Secured With: ACE WRAP - 55M ACE Elastic Bandage With VELCRO Brand Closure, 4 (in) 1 x Per Day/30 Days. Currently, she states that she changes wound dressing as ordered, continues to experience intermittent pain to site and ankle is still swollen. She states that she will follow up at the wound clinic on 05/10/23 and Podiatrist on 05/17/23.She denies chest pain, palpitation, vision changes, withdrawal symptoms and tremors. Overall, she states that she's doing well and offers no further complaint.   Review of Systems  Constitutional: Negative.   Eyes: Negative.   Respiratory: Negative.    Cardiovascular: Negative.   Skin:        Healing wound to lateral aspect of left ankle  Neurological: Negative.   Psychiatric/Behavioral: Negative.        Objective:     BP (!) 146/90 (BP Location: Right Arm, Patient Position: Sitting, Cuff Size: Normal)   Pulse 80   Temp 98 F (36.7 C) (Oral)   Resp 16   Ht 5' (1.524 m)   Wt 147 lb 6.4 oz (66.9 kg)   LMP 04/23/2019 (Approximate)   SpO2 97%   BMI 28.79 kg/m  BP Readings from Last 3 Encounters:  04/27/23 (!) 146/90  03/30/23 (!) 154/83  03/23/23 (!) 166/98   Wt Readings from Last 3 Encounters:  04/27/23 147 lb 6.4 oz (66.9 kg)  03/30/23 150 lb 8 oz (68.3 kg)  03/23/23 149 lb (67.6 kg)      Physical Exam HENT:     Head: Normocephalic and atraumatic.  Mouth/Throat:     Mouth: Mucous membranes are moist.  Eyes:     Pupils: Pupils are equal, round, and reactive to light.  Cardiovascular:     Rate and Rhythm: Normal rate and regular rhythm.     Pulses: Normal pulses.     Heart sounds: Normal heart sounds.  Pulmonary:     Effort: Pulmonary effort is normal.     Breath sounds: Normal breath sounds.  Skin:    Coloration: Skin is not jaundiced.     Comments: Intact dressing to left ankle  Neurological:      General: No focal deficit present.     Mental Status: She is alert and oriented to person, place, and time. Mental status is at baseline.  Psychiatric:        Mood and Affect: Mood normal.        Behavior: Behavior normal.        Thought Content: Thought content normal.        Judgment: Judgment normal.      No results found for any visits on 04/27/23.  Last CBC Lab Results  Component Value Date   WBC 5.0 03/23/2023   HGB 10.6 (L) 03/23/2023   HCT 31.1 (L) 03/23/2023   MCV 97.2 03/23/2023   MCH 33.1 03/23/2023   RDW 11.7 03/23/2023   PLT 214 03/23/2023   Last metabolic panel Lab Results  Component Value Date   GLUCOSE 126 (H) 03/23/2023   NA 130 (L) 03/23/2023   K 3.6 03/23/2023   CL 94 (L) 03/23/2023   CO2 24 03/23/2023   BUN 6 03/23/2023   CREATININE 0.51 03/23/2023   GFRNONAA >60 03/23/2023   CALCIUM 8.7 (L) 03/23/2023   PHOS 3.2 01/10/2021   PROT 7.8 03/23/2023   ALBUMIN 3.8 03/23/2023   LABGLOB 5.1 (H) 12/20/2022   AGRATIO 0.7 (L) 12/20/2022   BILITOT 0.7 03/23/2023   ALKPHOS 80 03/23/2023   AST 85 (H) 03/23/2023   ALT 32 03/23/2023   ANIONGAP 12 03/23/2023   Last lipids Lab Results  Component Value Date   CHOL 177 03/30/2023   HDL 79 03/30/2023   LDLCALC 83 03/30/2023   TRIG 84 03/30/2023   CHOLHDL 2.2 03/30/2023   Last hemoglobin A1c Lab Results  Component Value Date   HGBA1C 5.6 03/30/2023   Last thyroid functions Lab Results  Component Value Date   TSH 1.380 03/30/2023      The 10-year ASCVD risk score (Arnett DK, et al., 2019) is: 2.8%    Assessment & Plan:   1. Palpitations - She will continue current medication, advised to go to the ED for worsening symptoms. - propranolol (INDERAL) 40 MG tablet; TAKE 1 TABLET(40 MG) BY MOUTH DAILY  Dispense: 90 tablet; Refill: 0  2. Wound of left ankle, subsequent encounter - She was advised to continue wound dressing as ordered and to keep her appointments at the wound clinic and  Podiatry.  3. ETOH abuse - She was strongly encouraged on alcohol abstinence.    Return if symptoms worsen or fail to improve. No follow up appointment, she has active Medicaid. [facility] wishes her well with her care.   Dontreal Miera Trellis Paganini, NP

## 2023-05-10 ENCOUNTER — Ambulatory Visit: Payer: Self-pay | Admitting: Internal Medicine

## 2023-05-17 ENCOUNTER — Ambulatory Visit: Payer: Self-pay | Admitting: Podiatry

## 2023-05-24 ENCOUNTER — Ambulatory Visit: Payer: Self-pay | Admitting: Podiatry

## 2023-05-24 ENCOUNTER — Encounter: Payer: Medicaid Other | Attending: Internal Medicine | Admitting: Internal Medicine

## 2023-05-24 DIAGNOSIS — L97528 Non-pressure chronic ulcer of other part of left foot with other specified severity: Secondary | ICD-10-CM | POA: Diagnosis present

## 2023-05-24 DIAGNOSIS — I10 Essential (primary) hypertension: Secondary | ICD-10-CM | POA: Diagnosis not present

## 2023-05-24 DIAGNOSIS — L089 Local infection of the skin and subcutaneous tissue, unspecified: Secondary | ICD-10-CM | POA: Insufficient documentation

## 2023-05-31 ENCOUNTER — Ambulatory Visit: Payer: Self-pay | Admitting: Podiatry

## 2023-06-06 ENCOUNTER — Ambulatory Visit: Payer: Medicaid Other | Admitting: Podiatry

## 2023-06-06 DIAGNOSIS — M86462 Chronic osteomyelitis with draining sinus, left tibia and fibula: Secondary | ICD-10-CM

## 2023-06-06 DIAGNOSIS — M7989 Other specified soft tissue disorders: Secondary | ICD-10-CM

## 2023-06-06 NOTE — Progress Notes (Signed)
Subjective:  Patient ID: [name], female    DOB: 1976/07/08,  MRN: [id number]  Chief Complaint  Patient presents with   Wound Check    Left ankle wound     47 y.o. female presents with the above complaint.  Patient presents with complaint of left ankle wound that has been present for quite some time.  Patient said there is some drainage associated with it.  It is very small peripheral wound.  She wanted to get it evaluated.  She had an MRI done which did not show any kind of bone infection.  She is here for another option and treatment option.  She pain scale 1 rubbing on it pain scale is 5 out of 10 dull achy in nature.  Patient was being seen at the wound care center.  She is not taking any antibiotics as he is not clinically infected   Review of Systems: Negative except as noted in the HPI. Denies N/V/F/Ch.  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   Alcohol abuse    Anxiety    Bacterial infection    Cervical intraepithelial neoplasia III    CIN III (cervical intraepithelial neoplasia grade III) with severe dysplasia 12/19/2005   Condyloma 12/19/2009   H/O fatigue 12/20/2007   H/O varicella    Headache(784.0)    HPV (human papilloma virus) anogenital infection    Hx of dizziness 09/20/2011   Hx: UTI (urinary tract infection)    Back pain   Hypertension    Irregular menstrual cycle    IV drug user    Palpitations 12/19/2008   Pelvic pain in female 12/05/2011   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    Age 57   Trichomonas    Yeast infection     Current Outpatient Medications:    diphenhydrAMINE (BENADRYL) 25 MG tablet, Take 25 mg by mouth daily as needed for allergies., Disp: , Rfl:    fluticasone (FLONASE) 50 MCG/ACT nasal spray, Place 2 sprays into both nostrils daily., Disp: 16 g, Rfl: 2   ibuprofen (ADVIL) 600 MG tablet, Take 1 tablet (600 mg total) by mouth every 8 (eight) hours as  needed., Disp: 15 tablet, Rfl: 0   loratadine (CLARITIN) 10 MG tablet, Take 1 tablet (10 mg total) by mouth daily., Disp: 30 tablet, Rfl: 0   Omega-3 Fatty Acids (FISH OIL) 1200 MG CAPS, Take 1,200 mg by mouth., Disp: , Rfl:    propranolol (INDERAL) 40 MG tablet, TAKE 1 TABLET(40 MG) BY MOUTH DAILY, Disp: 90 tablet, Rfl: 0   pseudoephedrine (SUDAFED) 30 MG tablet, Take 30 mg by mouth daily as needed for congestion., Disp: , Rfl:   Social History   Tobacco Use  Smoking Status Every Day   Packs/day: 0.25   Years: 30.00   Additional pack years: 0.00   Total pack years: 7.50   Types: Cigarettes  Smokeless Tobacco Never  Tobacco Comments   Has cut down to 4-5 cigarettes per day    Allergies  Allergen Reactions   Acetaminophen Other (See Comments)    Intolerance, hx of tylenol OD   Objective:  There were no vitals filed for this visit. There is no height or weight on file to calculate BMI. Constitutional Well developed. Well nourished.  Vascular Dorsalis pedis pulses palpable bilaterally. Posterior tibial pulses palpable bilaterally. Capillary refill normal to all digits.  No cyanosis or  clubbing noted. Pedal hair growth normal.  Neurologic Normal speech. Oriented to person, place, and time. Epicritic sensation to light touch grossly present bilaterally.  Dermatologic Nails well groomed and normal in appearance. No open wounds. No skin lesions.  Orthopedic: Soft tissue ulceration noted to the left ankle with scab formation over it.  Does probe down to deep tissue.   Radiographs: 1. Soft tissue ulcer within the lateral ankle at the level of the distal femoral metaphysis, extending up to half of the subcutaneous fat towards the distal fibula. On recent radiographs there was subtle periosteal reaction within the distal lateral fibular diaphysis and metaphysis. On the current MRI there is only trace subcortical marrow edema within the distal lateral  fibular metaphyseal/epiphyseal region with possible minimal cortical thinning. This marrow edema may be reactive and noninfected, however it is difficult to exclude very early acute osteomyelitis. 2. Mild peroneus longus and brevis tenosynovitis without tendon tear. 3. Minimal posterior tibial tenosynovitis.  Assessment:   1. Soft tissue mass   2. 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  Left ankle soft tissue mass with superficial ulceration down to sinus tract down to bone -All questions and concerns were discussed with the patient in extensive detail.  Given the size of the soft tissue mass in the setting of an ulceration that probes down to deep bone.  I believe patient will benefit from a CT scan to evaluate the cortex of the bone.  MRI was negative for early osteomyelitis. -CT scan was ordered -Patient will need surgical intervention with excision of soft tissue mass with possible bone biopsy  No follow-ups on file.  Left ankle soft tissue mass with superficial ulcer with a sinus tract possibly going down to the boneLeft soft tissue mass/soft tissue ulcer sinus tract down

## 2023-06-07 ENCOUNTER — Ambulatory Visit: Payer: Medicaid Other | Admitting: Internal Medicine

## 2023-06-19 ENCOUNTER — Telehealth: Payer: Self-pay | Admitting: Podiatry

## 2023-06-19 NOTE — Telephone Encounter (Signed)
[facility] called to follow up on the Ct order being changed. Their facility does not do Ct w or wo you will have to choose either or.

## 2023-06-27 ENCOUNTER — Ambulatory Visit: Payer: Medicaid Other | Admitting: Podiatry

## 2023-06-29 ENCOUNTER — Other Ambulatory Visit: Payer: Self-pay | Admitting: Podiatry

## 2023-06-29 DIAGNOSIS — M7989 Other specified soft tissue disorders: Secondary | ICD-10-CM

## 2023-06-29 DIAGNOSIS — M86462 Chronic osteomyelitis with draining sinus, left tibia and fibula: Secondary | ICD-10-CM

## 2023-06-30 NOTE — Progress Notes (Signed)
[name], [name] ([id number]) [id number] Page 1 of 9 Visit Report for 05/24/2023 Arrival Information Details Patient Name: Date of Service: [name], [name]. 05/24/2023 3:30 PM Medical Record Number: [id number] Patient Account Number: [id number] Date of Birth/Sex: Treating RN: August 11, 1976 (47 y.o. [name] Primary Care [name]: [name] Bear Other Clinician: Betha Rice Referring [name]: Treating [name]: [name], [name] [name], [name] Weeks in Treatment: 16 Visit Information History Since Last Visit All ordered tests and consults were completed: No Patient Arrived: Ambulatory Added or deleted any medications: No Arrival Time: 15:48 Any new allergies or adverse reactions: No Transfer Assistance: None Had a fall or experienced change in No Patient Identification Verified: Yes activities of daily living that may affect Secondary Verification Process Completed: Yes risk of falls: Patient Requires Transmission-Based Precautions: No Signs or symptoms of abuse/neglect since last visito No Patient Has Alerts: Yes Hospitalized since last visit: No Patient Alerts: NOT DIABETIC Implantable device outside of the clinic excluding No cellular tissue based products placed in the center since last visit: Has Dressing in Place as Prescribed: Yes Pain Present Now: Yes Electronic Signature(s) Signed: 05/25/2023 4:55:34 PM By: [name] Entered By: [name] on 05/24/2023 15:49:52 -------------------------------------------------------------------------------- Clinic Level of Care Assessment Details Patient Name: Date of Service: [name] M. 05/24/2023 3:30 PM Medical Record Number: [id number] Patient Account Number: [id number] Date of Birth/Sex: Treating RN: 1976-11-11 (47 y.o. [name] Primary Care [name]: [name] Bear Other Clinician: Betha Rice Referring [name]: Treating [name]:  [name], [name] [name], [name] Weeks in Treatment: 16 Clinic Level of Care Assessment Items TOOL 4 Quantity Score []  - 0 Use when only an EandM is performed on FOLLOW-UP visit ASSESSMENTS - Nursing Assessment / Reassessment X- 1 10 Reassessment of Co-morbidities (includes updates in patient status) X- 1 5 Reassessment of Adherence to Treatment Plan BUNICE, [name] ([id number]) [id number] Page 2 of 9 ASSESSMENTS - Wound and Skin A ssessment / Reassessment X - Simple Wound Assessment / Reassessment - one wound 1 5 []  - 0 Complex Wound Assessment / Reassessment - multiple wounds []  - 0 Dermatologic / Skin Assessment (not related to wound area) ASSESSMENTS - Focused Assessment []  - 0 Circumferential Edema Measurements - multi extremities []  - 0 Nutritional Assessment / Counseling / Intervention []  - 0 Lower Extremity Assessment (monofilament, tuning fork, pulses) []  - 0 Peripheral Arterial Disease Assessment (using hand held doppler) ASSESSMENTS - Ostomy and/or Continence Assessment and Care []  - 0 Incontinence Assessment and Management []  - 0 Ostomy Care Assessment and Management (repouching, etc.) PROCESS - Coordination of Care X - Simple Patient / Family Education for ongoing care 1 15 []  - 0 Complex (extensive) Patient / Family Education for ongoing care []  - 0 Staff obtains Chiropractor, Records, T Results / Process Orders est []  - 0 Staff telephones HHA, Nursing Homes / Clarify orders / etc []  - 0 Routine Transfer to another Facility (non-emergent condition) []  - 0 Routine Hospital Admission (non-emergent condition) []  - 0 New Admissions / Manufacturing engineer / Ordering NPWT Apligraf, etc. , []  - 0 Emergency Hospital Admission (emergent condition) X- 1 10 Simple Discharge Coordination []  - 0 Complex (extensive) Discharge Coordination PROCESS - Special Needs []  - 0 Pediatric / Minor Patient Management []  - 0 Isolation  Patient Management []  - 0 Hearing / Language / Visual special needs []  - 0 Assessment of Community assistance (transportation, D/C planning, etc.) []  - 0 Additional  assistance / Altered mentation []  - 0 Support Surface(s) Assessment (bed, cushion, seat, etc.) INTERVENTIONS - Wound Cleansing / Measurement X - Simple Wound Cleansing - one wound 1 5 []  - 0 Complex Wound Cleansing - multiple wounds X- 1 5 Wound Imaging (photographs - any number of wounds) []  - 0 Wound Tracing (instead of photographs) X- 1 5 Simple Wound Measurement - one wound []  - 0 Complex Wound Measurement - multiple wounds INTERVENTIONS - Wound Dressings X - Small Wound Dressing one or multiple wounds 1 10 []  - 0 Medium Wound Dressing one or multiple wounds []  - 0 Large Wound Dressing one or multiple wounds X- 1 5 Application of Medications - topical []  - 0 Application of Medications - injection INTERVENTIONS - Miscellaneous []  - 0 External ear exam [name], [name] ([id number]) [id number] Page 3 of 9 []  - 0 Specimen Collection (cultures, biopsies, blood, body fluids, etc.) []  - 0 Specimen(s) / Culture(s) sent or taken to Lab for analysis []  - 0 Patient Transfer (multiple staff / [name] / Similar devices) []  - 0 Simple Staple / Suture removal (25 or less) []  - 0 Complex Staple / Suture removal (26 or more) []  - 0 Hypo / Hyperglycemic Management (close monitor of Blood Glucose) []  - 0 Ankle / Brachial Index (ABI) - do not check if billed separately X- 1 5 Vital Signs Has the patient been seen at the hospital within the last three years: Yes Total Score: 80 Level Of Care: New/Established - Level 3 Electronic Signature(s) Signed: 05/25/2023 4:55:34 PM By: [name] Entered By: [name] on 05/24/2023 16:19:04 -------------------------------------------------------------------------------- Encounter Discharge Information Details Patient Name: Date of  Service: [name], [name] [name] M. 05/24/2023 3:30 PM Medical Record Number: [id number] Patient Account Number: [id number] Date of Birth/Sex: Treating RN: 1976-07-11 (47 y.o. [name] Primary Care Kayse Puccini: [name] Bear Other Clinician: Betha Rice Referring Nevyn Bossman: Treating Ranen Doolin/Extender: [name], [name] [name], [name] Weeks in Treatment: 16 Encounter Discharge Information Items Discharge Condition: Stable Ambulatory Status: Ambulatory Discharge Destination: Home Transportation: Private Auto Accompanied By: self Schedule Follow-up Appointment: Yes Clinical Summary of Care: Electronic Signature(s) Signed: 05/25/2023 4:55:34 PM By: [name] Entered By: [name] on 05/24/2023 16:51:57 Lower Extremity Assessment Details -------------------------------------------------------------------------------- [name] ([id number]) [id number] Page 4 of 9 Patient Name: Date of Service: [name] RRA [place], [name]. 05/24/2023 3:30 PM Medical Record Number: [id number] Patient Account Number: [id number] Date of Birth/Sex: Treating RN: [birth year] (47 y.o. [name] Primary Care Rosalinda Seaman: [name] Bear Other Clinician: Betha Rice Referring Jayme Mednick: Treating Marylin Lathon/Extender: [name], [name] [name], [name] Weeks in Treatment: 16 Edema Assessment Left: Right: Assessed: Yes No Edema: Yes Calf Left: Right: Point of Measurement: 29 cm From Medial Instep 35.5 cm Ankle Left: Right: Point of Measurement: 12 cm From Medial Instep 22 cm Vascular Assessment Left: Right: Pulses: Dorsalis Pedis Palpable: Yes Electronic Signature(s) Signed: 05/25/2023 11:07:37 AM By: Elliot Gurney, BSN, RN, CWS, Kim RN, BSN Signed: 05/25/2023 4:55:34 PM By: [name] Entered By: [name] on 05/24/2023 16:04:26 -------------------------------------------------------------------------------- Multi Wound Chart Details Patient Name:  Date of Service: [name] RRA [place] M. 05/24/2023 3:30 PM Medical Record Number: [id number] Patient Account Number: [id number] Date of Birth/Sex: Treating RN: [birth year] (46 y.o. [name] Primary Care Jalonda Antigua: [name] Bear Other Clinician: Betha Rice Referring Merl Guardino: Treating Madgeline Rayo/Extender: [name], [name] [name], [name] Weeks in Treatment: 16 Vital Signs Height(in): 60 Pulse(bpm): 73  Weight(lbs): 145 Blood Pressure(mmHg): 167/92 Body Mass Index(BMI): 28.3 Temperature(F): 98.1 Respiratory Rate(breaths/min): 16 [1:Photos:] [Rice/A:Rice/A] Left, Lateral Ankle Rice/A Rice/A Wound Location: [name], [name] ([id number]) [id number] Page 5 of 9 Not Known Rice/A Rice/A Wounding Event: Cellulitis Rice/A Rice/A Primary Etiology: Hypertension Rice/A Rice/A Comorbid History: 01/19/2023 Rice/A Rice/A Date Acquired: 35 Rice/A Rice/A Weeks of Treatment: Open Rice/A Rice/A Wound Status: No Rice/A Rice/A Wound Recurrence: 0.1x0.1x0.1 Rice/A Rice/A Measurements L x W x D (cm) 0.008 Rice/A Rice/A A (cm) : rea 0.001 Rice/A Rice/A Volume (cm) : 99.20% Rice/A Rice/A % Reduction in A rea: 99.90% Rice/A Rice/A % Reduction in Volume: Unclassifiable Rice/A Rice/A Classification: Large Rice/A Rice/A Exudate A mount: Purulent Rice/A Rice/A Exudate Type: yellow, brown, green Rice/A Rice/A Exudate Color: None Present (0%) Rice/A Rice/A Granulation A mount: Large (67-100%) Rice/A Rice/A Necrotic A mount: Fat Layer (Subcutaneous Tissue): Yes Rice/A Rice/A Exposed Structures: Fascia: No Tendon: No Muscle: No Joint: No Bone: No None Rice/A Rice/A Epithelialization: Treatment Notes Electronic Signature(s) Signed: 05/25/2023 4:55:34 PM By: [name] Entered By: [name] on 05/24/2023 16:04:35 -------------------------------------------------------------------------------- Multi-Disciplinary Care Plan Details Patient Name: Date of Service: [name] RRA [place], [name] [name] M. 05/24/2023 3:30 PM Medical Record Number: [id number] Patient Account Number:  [id number] Date of Birth/Sex: Treating RN: [birth year] (46 y.o. [name] Primary Care Ilanna Deihl: [name] Bear Other Clinician: Betha Rice Referring Garlene Apperson: Treating Akyla Vavrek/Extender: [name], [name] EL Epimenio Sarin, [name] Weeks in Treatment: 16 Active Inactive Electronic Signature(s) Signed: 06/30/2023 1:36:21 PM By: Elliot Gurney, BSN, RN, CWS, Kim RN, BSN Previous Signature: 05/25/2023 11:07:37 AM Version By: Elliot Gurney BSN, RN, CWS, Kim RN, BSN Previous Signature: 05/25/2023 4:55:34 PM Version By: [name] Entered By: Elliot Gurney BSN, RN, CWS, Kim on 06/30/2023 13:36:21 [name] ([id number]) [id number] Page 6 of 9 -------------------------------------------------------------------------------- Pain Assessment Details Patient Name: Date of Service: [name] RRA [place], [name]. 05/24/2023 3:30 PM Medical Record Number: [id number] Patient Account Number: [id number] Date of Birth/Sex: Treating RN: [birth year] (47 y.o. [name] Primary Care Anan Dapolito: [name] Bear Other Clinician: Betha Rice Referring Terrika Zuver: Treating Shyniece Scripter/Extender: [name], [name] [name], [name] Weeks in Treatment: 16 Active Problems Location of Pain Severity and Description of Pain Patient Has Paino Yes Site Locations Pain Location: Pain in Ulcers Duration of the Pain. Constant / Intermittento Intermittent Rate the pain. Current Pain Level: 5 Character of Pain Describe the Pain: Aching Pain Management and Medication Current Pain Management: Medication: Yes Cold Application: No Rest: No Massage: No Activity: No T.E.Rice.S.: No Heat Application: No Leg drop or elevation: No Is the Current Pain Management Adequate: Inadequate How does your wound impact your activities of daily livingo Sleep: No Bathing: No Appetite: No Relationship With Others: No Bladder Continence: No Emotions: No Bowel Continence: No Work: No Toileting: No Drive:  No Dressing: No Hobbies: No Psychologist, prison and probation services) Signed: 05/25/2023 11:07:37 AM By: Elliot Gurney, BSN, RN, CWS, Kim RN, BSN Signed: 05/25/2023 4:55:34 PM By: [name] Entered By: [name] on 05/24/2023 15:55:56 [name] ([id number]) [id number] Page 7 of 9 -------------------------------------------------------------------------------- Patient/Caregiver Education Details Patient Name: Date of Service: [name] RRA [place] Alvy Beal 6/5/2024andnbsp3:30 PM Medical Record Number: [id number] Patient Account Number: [id number] Date of Birth/Gender: Treating RN: September 25, 1976 (47 y.o. [name] Primary Care Physician: [name] Bear Other Clinician: Betha Rice Referring Physician: Treating Physician/Extender: [name], [name] [name], [name] Weeks in Treatment: 16 Education Assessment Education Provided To: Patient Education Topics Provided Wound/Skin Impairment: Handouts: Other:  continue wound care as directed Methods: Explain/Verbal Responses: State content correctly Electronic Signature(s) Signed: 05/25/2023 4:55:34 PM By: [name] Entered By: [name] on 05/24/2023 16:19:41 -------------------------------------------------------------------------------- Wound Assessment Details Patient Name: Date of Service: [name] RRA [place] M. 05/24/2023 3:30 PM Medical Record Number: [id number] Patient Account Number: [id number] Date of Birth/Sex: Treating RN: [birth year] (47 y.o. [name] Primary Care Virtie Bungert: [name] Bear Other Clinician: Betha Rice Referring Ane Conerly: Treating Jakyria Bleau/Extender: [name], [name] [name], [name] Weeks in Treatment: 16 Wound Status Wound Number: 1 Primary Etiology: Cellulitis Wound Location: Left, Lateral Ankle Wound Status: Open Wounding Event: Not Known Comorbid History: Hypertension Date Acquired: 01/19/2023 Weeks Of Treatment: 16 Clustered Wound: No Photos [name], [name] ([id number]) [id number] Page 8 of 9 Wound Measurements Length: (cm) 0.3 Width: (cm) 0.2 Depth: (cm) 0.3 Area: (cm) 0.047 Volume: (cm) 0.014 % Reduction in Area: 95% % Reduction in Volume: 98.3% Epithelialization: None Wound Description Classification: Unclassifiable Exudate Amount: Large Exudate Type: Purulent Exudate Color: yellow, brown, green Foul Odor After Cleansing: No Slough/Fibrino Yes Wound Bed Granulation Amount: None Present (0%) Exposed Structure Necrotic Amount: Large (67-100%) Fascia Exposed: No Fat Layer (Subcutaneous Tissue) Exposed: Yes Tendon Exposed: No Muscle Exposed: No Joint Exposed: No Bone Exposed: No Electronic Signature(s) Signed: 05/25/2023 11:07:37 AM By: Elliot Gurney, BSN, RN, CWS, Kim RN, BSN Signed: 05/25/2023 4:55:34 PM By: [name] Entered By: [name] on 05/24/2023 16:12:23 -------------------------------------------------------------------------------- Vitals Details Patient Name: Date of Service: [name] RRA [place], [name] [place] M. 05/24/2023 3:30 PM Medical Record Number: [id number] Patient Account Number: [id number] Date of Birth/Sex: Treating RN: [birth year] (46 y.o. [name] Primary Care Takina Busser: [name] Bear Other Clinician: Betha Rice Referring Sheretta Grumbine: Treating Daran Favaro/Extender: [name], [name] [name], [name] Weeks in Treatment: 16 Vital Signs Time Taken: 15:53 Temperature (F): 98.1 Height (in): 60 Pulse (bpm): 73 Weight (lbs): 145 Respiratory Rate (breaths/min): 16 Body Mass Index (BMI): 28.3 Blood Pressure (mmHg): 167/92 Reference Range: 80 - 120 mg / dl Electronic Signature(s) Signed: 05/25/2023 4:55:34 PM By: Delice Lesch ([id number]) [id number] Page 9 of 9 Entered By: [name] on 05/24/2023 15:55:52

## 2023-07-04 ENCOUNTER — Inpatient Hospital Stay: Admission: RE | Admit: 2023-07-04 | Payer: MEDICAID | Source: Ambulatory Visit

## 2023-07-04 ENCOUNTER — Other Ambulatory Visit: Payer: Medicaid Other

## 2023-07-06 ENCOUNTER — Ambulatory Visit: Payer: Medicaid Other | Admitting: Podiatry

## 2023-07-26 ENCOUNTER — Other Ambulatory Visit: Payer: Self-pay | Admitting: Gerontology

## 2023-07-26 DIAGNOSIS — R002 Palpitations: Secondary | ICD-10-CM

## 2023-08-25 ENCOUNTER — Ambulatory Visit: Payer: Self-pay | Admitting: Physician Assistant

## 2023-10-05 ENCOUNTER — Encounter: Payer: Self-pay | Admitting: Physician Assistant

## 2023-10-05 ENCOUNTER — Ambulatory Visit (INDEPENDENT_AMBULATORY_CARE_PROVIDER_SITE_OTHER): Payer: MEDICAID | Admitting: Physician Assistant

## 2023-10-05 VITALS — BP 190/130 | HR 108 | Temp 97.4°F | Ht 60.0 in | Wt 142.0 lb

## 2023-10-05 DIAGNOSIS — R002 Palpitations: Secondary | ICD-10-CM | POA: Diagnosis not present

## 2023-10-05 DIAGNOSIS — I10 Essential (primary) hypertension: Secondary | ICD-10-CM | POA: Diagnosis not present

## 2023-10-05 DIAGNOSIS — L02416 Cutaneous abscess of left lower limb: Secondary | ICD-10-CM

## 2023-10-05 DIAGNOSIS — I160 Hypertensive urgency: Secondary | ICD-10-CM | POA: Diagnosis not present

## 2023-10-05 DIAGNOSIS — D649 Anemia, unspecified: Secondary | ICD-10-CM

## 2023-10-05 DIAGNOSIS — R7989 Other specified abnormal findings of blood chemistry: Secondary | ICD-10-CM

## 2023-10-05 DIAGNOSIS — L03115 Cellulitis of right lower limb: Secondary | ICD-10-CM

## 2023-10-05 DIAGNOSIS — J0100 Acute maxillary sinusitis, unspecified: Secondary | ICD-10-CM

## 2023-10-05 MED ORDER — DOXYCYCLINE HYCLATE 100 MG PO TABS
100.0000 mg | ORAL_TABLET | Freq: Two times a day (BID) | ORAL | 0 refills | Status: DC
Start: 2023-10-05 — End: 2023-10-09

## 2023-10-05 MED ORDER — PROPRANOLOL HCL 40 MG PO TABS
ORAL_TABLET | ORAL | 1 refills | Status: DC
Start: 2023-10-05 — End: 2023-10-12

## 2023-10-05 MED ORDER — CLONIDINE HCL 0.1 MG PO TABS
0.1000 mg | ORAL_TABLET | Freq: Three times a day (TID) | ORAL | 3 refills | Status: DC | PRN
Start: 2023-10-05 — End: 2024-02-21

## 2023-10-05 NOTE — Patient Instructions (Signed)

## 2023-10-05 NOTE — Progress Notes (Signed)
Date:  10/05/2023   Name:  [name]   DOB:  [birth year]   MRN:  [id number]   Chief Complaint: Establish Care, Hypertension, and Rash  Rash This is a new problem. Episode onset: X3-4. The problem has been gradually worsening since onset. Location: right leg. The rash is characterized by burning, itchiness, pain, redness, swelling, blistering, bruising, draining, scaling, peeling and dryness. She was exposed to nothing. Associated symptoms include congestion, fatigue and rhinorrhea. Pertinent negatives include no fever or shortness of breath. Past treatments include moisturizer and anti-itch cream. The treatment provided no relief.   [name] is a pleasant 47 year old female with a history of HTN, anxiety, chronic sinusitis, anemia, palpitations, and distant history of polysubstance abuse (no illicit substances for 8 years per patient report) who presents new to our clinic today to establish care.  [facility], mainly addressing poorly controlled HTN.  She has been out of propranolol for several months now, but states that her palpitations and blood pressure are usually well-controlled on this medication and requests refill today.  She also has chronic lower extremity issues.  Last visit with podiatry Dr. Nicholes Rough DPM. 06/06/2023 for ulcerated soft tissue mass of the left ankle with sinus tract possibly down to the bone, hx cellulitis.  MRI 02/14/23 was essentially negative for acute bone infection but they did want her to return for CT of the left ankle 07/04/2023 for which she no-showed.  Today she complains of persistent left ankle swelling and pain, as well as a rash of the right shin and right forearm which is acutely tender to the touch.  History of MRSA infection January 2022.   Acute on chronic sinusitis worsening for the last 2 weeks with significant sinus congestion and thick discolored nasal discharge.  Endorses history of  environmental allergies.   Medication list has been reviewed and updated.  Current Meds  Medication Sig   cloNIDine (CATAPRES) 0.1 MG tablet Take 1 tablet (0.1 mg total) by mouth 3 (three) times daily as needed (for BP >170/110).   diphenhydrAMINE (BENADRYL) 25 MG tablet Take 25 mg by mouth daily as needed for allergies.   doxycycline (VIBRA-TABS) 100 MG tablet Take 1 tablet (100 mg total) by mouth 2 (two) times daily for 7 days. Do not take with dairy. This medication INCREASES SUN SENSITIVITY so avoid direct sunlight.   fluticasone (FLONASE) 50 MCG/ACT nasal spray Place 2 sprays into both nostrils daily.   ibuprofen (ADVIL) 600 MG tablet Take 1 tablet (600 mg total) by mouth every 8 (eight) hours as needed.   Omega-3 Fatty Acids (FISH OIL) 1200 MG CAPS Take 1,200 mg by mouth.   [DISCONTINUED] loratadine (CLARITIN) 10 MG tablet Take 1 tablet (10 mg total) by mouth daily.   [DISCONTINUED] propranolol (INDERAL) 40 MG tablet TAKE 1 TABLET(40 MG) BY MOUTH DAILY (Patient taking differently: TAKE 1 TABLET(40 MG) BY MOUTH DAILY)   [DISCONTINUED] pseudoephedrine (SUDAFED) 30 MG tablet Take 30 mg by mouth daily as needed for congestion.     Review of Systems  Constitutional:  Positive for fatigue. Negative for fever.  HENT:  Positive for congestion, rhinorrhea and sinus pressure.   Respiratory:  Negative for chest tightness and shortness of breath.   Cardiovascular:  Negative for chest pain and palpitations.  Gastrointestinal:  Negative for abdominal pain.  Skin:  Positive for rash and wound.  Psychiatric/Behavioral:  The patient is nervous/anxious.     Patient Active  Problem List   Diagnosis Date Noted   Wound of ankle 03/30/2023   Wound drainage 12/20/2022   Onychomycosis 09/08/2022   Sinusitis, chronic 08/04/2021   Hot flashes 04/22/2021   History of HPV infection 04/22/2021   Stasis dermatitis of both legs 02/04/2021   Anemia 01/09/2021   Essential hypertension 01/16/2020    Smoking 01/16/2020   Personality disorder (HCC) 09/01/2016   History of cocaine dependence (HCC) 09/01/2016   History of alcohol abuse 07/09/2014   Elevated LFTs 04/08/2014   Palpitations - evaluated by Dr. Melburn Popper in the past 02/20/2014   Anxiety 04/02/2012   History of opioid abuse (HCC) 03/18/2012   History of cervical cancer 01/02/2012   Migraine 04/08/2011    Allergies  Allergen Reactions   Acetaminophen Other (See Comments)    Intolerance, hx of tylenol OD     There is no immunization history on file for this patient.  Past Surgical History:  Procedure Laterality Date   A*wisdom teeth ext     CERVICAL CONIZATION W/BX  11/30/2011   Procedure: CONIZATION CERVIX WITH BIOPSY;  Surgeon: Janine Limbo, MD;  Location: [facility];  Service: Gynecology;  Laterality: N/A;   CERVICAL CONIZATION W/BX  03/16/2012   Procedure: CONIZATION CERVIX WITH BIOPSY;  Surgeon: Kirkland Hun, MD;  Location: [facility];  Service: Gynecology;  Laterality: N/A;   CONIZATION CERVIX     x 3   DECORTICATION Right 01/14/2021   Procedure: DECORTICATION;  Surgeon: Loreli Slot, MD;  Location: [facility];  Service: Thoracic;  Laterality: Right;   DILATION AND CURETTAGE OF UTERUS  11/30/2011   Procedure: DILATATION AND CURETTAGE;  Surgeon: Janine Limbo, MD;  Location: [facility];  Service: Gynecology;  Laterality: N/A;   svd     x 1   VIDEO ASSISTED THORACOSCOPY (VATS)/EMPYEMA Right 01/14/2021   Procedure: VIDEO ASSISTED THORACOSCOPY (VATS)/DRAIN EMPYEMA;  Surgeon: Loreli Slot, MD;  Location: [facility] OR;  Service: Thoracic;  Laterality: Right;    Social History   Tobacco Use   Smoking status: Every Day    Current packs/day: 0.25    Average packs/day: 0.8 packs/day for 32.8 years (26.2 ttl pk-yrs)    Types: Cigarettes    Start date: 1992   Smokeless tobacco: Never   Tobacco comments:    Has cut down to 4-5 cigarettes per day  Vaping Use   Vaping status: Never Used  Substance Use Topics    Alcohol use: Yes    Alcohol/week: 10.0 standard drinks of alcohol    Types: 10 Standard drinks or equivalent per week    Comment: drinks ~qod (hard seltzer) 2-3 times a week   Drug use: Not Currently    Types: Heroin, Cocaine    Comment: Abused rx drugs - on methadone 08/2011, stopped methadone  2007    Family History  Problem Relation Age of Onset   Cervical cancer Mother    Breast cancer Mother    Cancer Mother 74       Breast & cervical    Thyroid disease Mother    Stroke Maternal Grandmother    COPD Maternal Grandfather        Emphysema   Hypertension Paternal Grandfather    Stroke Paternal Grandfather    Heart attack Paternal Grandfather    Cancer Father 43       prostate   Hyperlipidemia Father    Other Paternal Grandmother        "old age" mid 79s  10/05/2023    2:16 PM  GAD 7 : Generalized Anxiety Score  Nervous, Anxious, on Edge 3  Control/stop worrying 2  Worry too much - different things 3  Trouble relaxing 3  Restless 2  Easily annoyed or irritable 3  Afraid - awful might happen 0  Total GAD 7 Score 16  Anxiety Difficulty Not difficult at all       10/05/2023    2:15 PM 03/23/2023    9:56 AM 04/22/2021    2:27 PM  Depression screen PHQ 2/9  Decreased Interest 0 0 0  Down, Depressed, Hopeless 0 0 0  PHQ - 2 Score 0 0 0  Altered sleeping 2    Tired, decreased energy 2    Change in appetite 2    Feeling bad or failure about yourself  0    Trouble concentrating 0    Moving slowly or fidgety/restless 0    Suicidal thoughts 0    PHQ-9 Score 6    Difficult doing work/chores Not difficult at all      BP Readings from Last 3 Encounters:  10/05/23 (!) 190/130  04/27/23 (!) 146/90  03/30/23 (!) 154/83    Wt Readings from Last 3 Encounters:  10/05/23 142 lb (64.4 kg)  04/27/23 147 lb 6.4 oz (66.9 kg)  03/30/23 150 lb 8 oz (68.3 kg)    BP (!) 190/130   Pulse (!) 108   Temp (!) 97.4 F (36.3 C) (Oral)   Ht 5' (1.524 m)   Wt 142 lb (64.4  kg)   LMP 04/23/2019 (Approximate)   SpO2 97%   BMI 27.73 kg/m   Physical Exam Vitals and nursing note reviewed.  Constitutional:      Appearance: Normal appearance.  Cardiovascular:     Rate and Rhythm: Normal rate and regular rhythm.     Heart sounds: No murmur heard.    No friction rub. No gallop.  Pulmonary:     Effort: Pulmonary effort is normal.     Breath sounds: Normal breath sounds.  Abdominal:     General: There is no distension.  Musculoskeletal:        General: Normal range of motion.  Skin:    General: Skin is warm and dry.     Comments: Scattered scabbed papules of the right shin with subtle surrounding erythema, moderately tender to palpation but without significant edema.  Left lateral ankle with severely tender 4 cm fluctuant erythematous edema with central punctum currently scabbed with no active discharge.  Bilateral feet dry and cracked especially around the calcaneus.  Right forearm with pink discoloration and tenderness out of proportion to palpation, only minor wounds of the hand none of which are obviously infected  Neurological:     Mental Status: She is alert and oriented to person, place, and time.     Gait: Gait is intact.  Psychiatric:        Mood and Affect: Mood is anxious. Affect is tearful.     Recent Labs     Component Value Date/Time   NA 130 (L) 03/23/2023 1129   NA 130 (L) 12/20/2022 0000   NA 143 07/21/2014 1343   K 3.6 03/23/2023 1129   K 3.7 07/21/2014 1343   CL 94 (L) 03/23/2023 1129   CO2 24 03/23/2023 1129   CO2 25 07/21/2014 1343   GLUCOSE 126 (H) 03/23/2023 1129   GLUCOSE 95 07/21/2014 1343   BUN 6 03/23/2023 1129   BUN 4 (L) 12/20/2022  0000   BUN 4.6 (L) 07/21/2014 1343   CREATININE 0.51 03/23/2023 1129   CREATININE 0.6 07/21/2014 1343   CALCIUM 8.7 (L) 03/23/2023 1129   CALCIUM 8.9 07/21/2014 1343   PROT 7.8 03/23/2023 1129   PROT 8.6 (H) 12/20/2022 0000   PROT 7.0 07/21/2014 1343   ALBUMIN 3.8 03/23/2023 1129    ALBUMIN 3.5 (L) 12/20/2022 0000   ALBUMIN 3.2 (L) 07/21/2014 1343   AST 85 (H) 03/23/2023 1129   AST 301 (HH) 07/21/2014 1343   ALT 32 03/23/2023 1129   ALT 103 (H) 07/21/2014 1343   ALKPHOS 80 03/23/2023 1129   ALKPHOS 123 07/21/2014 1343   BILITOT 0.7 03/23/2023 1129   BILITOT 0.7 12/20/2022 0000   BILITOT 0.72 07/21/2014 1343   GFRNONAA >60 03/23/2023 1129   GFRAA 142 01/26/2021 1223    Lab Results  Component Value Date   WBC 5.0 03/23/2023   HGB 10.6 (L) 03/23/2023   HCT 31.1 (L) 03/23/2023   MCV 97.2 03/23/2023   PLT 214 03/23/2023   Lab Results  Component Value Date   HGBA1C 5.6 03/30/2023   Lab Results  Component Value Date   CHOL 177 03/30/2023   HDL 79 03/30/2023   LDLCALC 83 03/30/2023   TRIG 84 03/30/2023   CHOLHDL 2.2 03/30/2023   Lab Results  Component Value Date   TSH 1.380 03/30/2023     Assessment and Plan:  1. Essential hypertension Very high BP x 2 today.  Restart propranolol as soon as possible.  Check CBC and CMP.  Also prescribing clonidine for as needed use if patient experiences hypertensive urgency despite the use of propranolol.  Continue home BP monitoring, bring a log for my review next visit.  - propranolol (INDERAL) 40 MG tablet; TAKE 1 TABLET(40 MG) BY MOUTH EVERY 12-24 HOURS  Dispense: 180 tablet; Refill: 1 - CBC with Differential/Platelet - Comprehensive metabolic panel - cloNIDine (CATAPRES) 0.1 MG tablet; Take 1 tablet (0.1 mg total) by mouth 3 (three) times daily as needed (for BP >170/110).  Dispense: 90 tablet; Refill: 3  2. Hypertensive urgency Plan as above - CBC with Differential/Platelet - Comprehensive metabolic panel - cloNIDine (CATAPRES) 0.1 MG tablet; Take 1 tablet (0.1 mg total) by mouth 3 (three) times daily as needed (for BP >170/110).  Dispense: 90 tablet; Refill: 3  3. Palpitations Plan as above - propranolol (INDERAL) 40 MG tablet; TAKE 1 TABLET(40 MG) BY MOUTH EVERY 12-24 HOURS  Dispense: 180 tablet;  Refill: 1  4. Abscess of skin of left ankle Begin doxycycline immediately, which has MRSA coverage and should also help with the acute sinusitis and whatever is going on with her forearm.  I wrote down the name and phone number of her podiatrist and advised her to contact them as soon as possible for an urgent appointment.  Advised she may need drainage of the abscess and/or surgical intervention as discussed in the last note with podiatry 4 months ago.  - CBC with Differential/Platelet - doxycycline (VIBRA-TABS) 100 MG tablet; Take 1 tablet (100 mg total) by mouth 2 (two) times daily for 7 days. Do not take with dairy. This medication INCREASES SUN SENSITIVITY so avoid direct sunlight.  Dispense: 14 tablet; Refill: 0  5. Cellulitis of right lower extremity Plan as above.  Wrapped today.  Patient has topical antibiotics at home, advised to continue this. - doxycycline (VIBRA-TABS) 100 MG tablet; Take 1 tablet (100 mg total) by mouth 2 (two) times daily for 7 days. Do  not take with dairy. This medication INCREASES SUN SENSITIVITY so avoid direct sunlight.  Dispense: 14 tablet; Refill: 0  6. Acute non-recurrent maxillary sinusitis Treat with doxycycline which we are using for soft tissue infection - doxycycline (VIBRA-TABS) 100 MG tablet; Take 1 tablet (100 mg total) by mouth 2 (two) times daily for 7 days. Do not take with dairy. This medication INCREASES SUN SENSITIVITY so avoid direct sunlight.  Dispense: 14 tablet; Refill: 0  7. Elevated LFTs Repeat CMP today - Comprehensive metabolic panel  8. Anemia, unspecified type Chart review significant for a gradually worsening anemia since March 2022, most recently checked 03/23/2023 with hemoglobin 10.6, normocytic, normal iron panel, B12, folate.   Return in about 1 week (around 10/12/2023) for OV f/u ankle abscess, cellulitis, HTN.   Today's visit billed for provider time of 60 minutes spent on careful chart review from multiple external  sources, lab review for the past 2 years, assessment and treatment of several high-priority conditions with complication, patient education, medication management, and specialist coordination.  Alvester Morin, PA-C, DMSc, Nutritionist [facility] [phone]

## 2023-10-06 ENCOUNTER — Other Ambulatory Visit: Payer: Self-pay | Admitting: Physician Assistant

## 2023-10-06 DIAGNOSIS — R7989 Other specified abnormal findings of blood chemistry: Secondary | ICD-10-CM

## 2023-10-06 LAB — COMPREHENSIVE METABOLIC PANEL
ALT: 44 [IU]/L — ABNORMAL HIGH (ref 0–32)
AST: 151 [IU]/L — ABNORMAL HIGH (ref 0–40)
Albumin: 4.4 g/dL (ref 3.9–4.9)
Alkaline Phosphatase: 114 [IU]/L (ref 44–121)
BUN/Creatinine Ratio: 7 — ABNORMAL LOW (ref 9–23)
BUN: 4 mg/dL — ABNORMAL LOW (ref 6–24)
Bilirubin Total: 0.4 mg/dL (ref 0.0–1.2)
CO2: 23 mmol/L (ref 20–29)
Calcium: 9.5 mg/dL (ref 8.7–10.2)
Chloride: 85 mmol/L — ABNORMAL LOW (ref 96–106)
Creatinine, Ser: 0.54 mg/dL — ABNORMAL LOW (ref 0.57–1.00)
Globulin, Total: 3.8 g/dL (ref 1.5–4.5)
Glucose: 104 mg/dL — ABNORMAL HIGH (ref 70–99)
Potassium: 4 mmol/L (ref 3.5–5.2)
Sodium: 126 mmol/L — ABNORMAL LOW (ref 134–144)
Total Protein: 8.2 g/dL (ref 6.0–8.5)
eGFR: 113 mL/min/{1.73_m2} (ref >59)

## 2023-10-06 LAB — CBC WITH DIFFERENTIAL/PLATELET
Basophils Absolute: 0 10*3/uL (ref 0.0–0.2)
Basos: 1 %
EOS (ABSOLUTE): 0.1 10*3/uL (ref 0.0–0.4)
Eos: 2 %
Hematocrit: 35.3 % (ref 34.0–46.6)
Hemoglobin: 12.1 g/dL (ref 11.1–15.9)
Immature Grans (Abs): 0 10*3/uL (ref 0.0–0.1)
Immature Granulocytes: 0 %
Lymphocytes Absolute: 1.5 10*3/uL (ref 0.7–3.1)
Lymphs: 26 %
MCH: 32.4 pg (ref 26.6–33.0)
MCHC: 34.3 g/dL (ref 31.5–35.7)
MCV: 94 fL (ref 79–97)
Monocytes Absolute: 0.7 10*3/uL (ref 0.1–0.9)
Monocytes: 13 %
Neutrophils Absolute: 3.3 10*3/uL (ref 1.4–7.0)
Neutrophils: 58 %
Platelets: 179 10*3/uL (ref 150–450)
RBC: 3.74 x10E6/uL — ABNORMAL LOW (ref 3.77–5.28)
RDW: 11.7 % (ref 11.7–15.4)
WBC: 5.6 10*3/uL (ref 3.4–10.8)

## 2023-10-07 LAB — SPECIMEN STATUS REPORT

## 2023-10-07 LAB — HGB A1C W/O EAG: Hgb A1c MFr Bld: 5.8 % — ABNORMAL HIGH (ref 4.8–5.6)

## 2023-10-09 ENCOUNTER — Other Ambulatory Visit: Payer: Self-pay | Admitting: Physician Assistant

## 2023-10-09 ENCOUNTER — Encounter: Payer: Self-pay | Admitting: Physician Assistant

## 2023-10-09 ENCOUNTER — Ambulatory Visit: Payer: Self-pay

## 2023-10-09 DIAGNOSIS — R7303 Prediabetes: Secondary | ICD-10-CM | POA: Insufficient documentation

## 2023-10-09 DIAGNOSIS — L02416 Cutaneous abscess of left lower limb: Secondary | ICD-10-CM

## 2023-10-09 MED ORDER — SULFAMETHOXAZOLE-TRIMETHOPRIM 800-160 MG PO TABS
1.0000 | ORAL_TABLET | Freq: Two times a day (BID) | ORAL | 0 refills | Status: DC
Start: 2023-10-09 — End: 2024-02-21

## 2023-10-09 NOTE — Telephone Encounter (Signed)
  Chief Complaint: Medication reaction  - vomiting Symptoms: pt vomits after taking medication Frequency:  Pertinent Negatives: Patient denies  Disposition: [] ED /[] Urgent Care (no appt availability in office) / [] Appointment(In office/virtual)/ []  [facility] [place] [facility]/ [] Home Care/ [] Refused Recommended Disposition /[] [place] [facility]/ [x]  Follow-up with PCP Additional Notes: Pt states that when she takes the doxycycline she vomits it back up right way. Pt would like a different abx called into pharmacy.    Summary: medication problem   Pt called saying the antibiotic Doxycycline is making her throw up every time she takes It.  She said she would like to take something else.  [phone]     Reason for Disposition  [1] Caller has URGENT medicine question about med that PCP or specialist prescribed AND [2] triager unable to answer question  Answer Assessment - Initial Assessment Questions 1. NAME of MEDICINE: "What medicine(s) are you calling about?"     Doxycycline 2. QUESTION: "What is your question?" (e.g., double dose of medicine, side effect)     Medication is making her vomit 3. PRESCRIBER: "Who prescribed the medicine?" Reason: if prescribed by specialist, call should be referred to that group.     PCP 4. SYMPTOMS: "Do you have any symptoms?" If Yes, ask: "What symptoms are you having?"  "How bad are the symptoms (e.g., mild, moderate, severe)     Vomiting  Protocols used: Medication Question Call-A-AH

## 2023-10-09 NOTE — Telephone Encounter (Signed)
Please review.  KP

## 2023-10-09 NOTE — Telephone Encounter (Signed)
Patient called in reporting intolerance to doxycycline with vomiting, even when she takes it with food.  Very little doxycycline has made it into her system.  We will switch to Bactrim DS which patient believes she has taken before, plan for 7-day course of this.

## 2023-10-09 NOTE — Progress Notes (Signed)
Patient called in reporting intolerance to doxycycline with vomiting, even when she takes it with food.  Very little doxycycline has made it into her system.  We will switch to Bactrim DS which patient believes she has taken before, plan for 7-day course of this.

## 2023-10-12 ENCOUNTER — Encounter: Payer: Self-pay | Admitting: Physician Assistant

## 2023-10-12 ENCOUNTER — Ambulatory Visit (INDEPENDENT_AMBULATORY_CARE_PROVIDER_SITE_OTHER): Payer: MEDICAID | Admitting: Physician Assistant

## 2023-10-12 VITALS — BP 164/94 | HR 74 | Ht 60.0 in | Wt 143.0 lb

## 2023-10-12 DIAGNOSIS — I10 Essential (primary) hypertension: Secondary | ICD-10-CM

## 2023-10-12 DIAGNOSIS — L02416 Cutaneous abscess of left lower limb: Secondary | ICD-10-CM | POA: Diagnosis not present

## 2023-10-12 DIAGNOSIS — R002 Palpitations: Secondary | ICD-10-CM

## 2023-10-12 DIAGNOSIS — L03115 Cellulitis of right lower limb: Secondary | ICD-10-CM | POA: Diagnosis not present

## 2023-10-12 MED ORDER — PROPRANOLOL HCL 60 MG PO TABS
60.0000 mg | ORAL_TABLET | Freq: Two times a day (BID) | ORAL | 1 refills | Status: DC
Start: 2023-10-12 — End: 2024-01-10

## 2023-10-12 NOTE — Progress Notes (Signed)
Date:  10/12/2023   Name:  [name]   DOB:  09-26-1976   MRN:  [id number]   Chief Complaint: Hypertension, Cellulitis (Better than it was ), and Abscess (Slowly healing)  HPI Gianah presents today for 1 week f/u on HTN urgency, alcohol use disorder, and left ankle abscess started on doxycycline 10/05/23 but was unable to tolerate it (vomiting) so we switched to Bactrim which she has been taking since 10/09/23 (3d ago).  She reports improvement in the swelling and pain, though the left ankle abscess is still there.  She has not made appointment with podiatry yet.  We also started propranolol last visit for her anxiety, palpitations, and blood pressure.  She is taking 40 mg twice daily with symptomatic improvement, no side effects.  Home blood pressure readings usually about 160s/90s.  She has only had to use Catapres once.  Separately, she was found to have significant depleted electrolytes and elevated LFTs consistent with alcohol use disorder.  She is working on gradual alcohol reduction.  She is scheduled for repeat liver ultrasound 10/18/23.   Medication list has been reviewed and updated.  Current Meds  Medication Sig   cloNIDine (CATAPRES) 0.1 MG tablet Take 1 tablet (0.1 mg total) by mouth 3 (three) times daily as needed (for BP >170/110).   diphenhydrAMINE (BENADRYL) 25 MG tablet Take 25 mg by mouth daily as needed for allergies.   fluticasone (FLONASE) 50 MCG/ACT nasal spray Place 2 sprays into both nostrils daily.   ibuprofen (ADVIL) 600 MG tablet Take 1 tablet (600 mg total) by mouth every 8 (eight) hours as needed.   Omega-3 Fatty Acids (FISH OIL) 1200 MG CAPS Take 1,200 mg by mouth.   sulfamethoxazole-trimethoprim (BACTRIM DS) 800-160 MG tablet Take 1 tablet by mouth in the morning and at bedtime for 7 days. Take with food   [DISCONTINUED] propranolol (INDERAL) 40 MG tablet TAKE 1 TABLET(40 MG) BY MOUTH EVERY 12-24 HOURS     Review of Systems  Constitutional:   Positive for fatigue. Negative for fever.  Respiratory:  Negative for chest tightness and shortness of breath.   Cardiovascular:  Negative for chest pain and palpitations.  Gastrointestinal:  Negative for abdominal pain.  Skin:  Positive for rash and wound.    Patient Active Problem List   Diagnosis Date Noted   Prediabetes 10/09/2023   Wound of ankle 03/30/2023   Wound drainage 12/20/2022   Onychomycosis 09/08/2022   Sinusitis, chronic 08/04/2021   Hot flashes 04/22/2021   History of HPV infection 04/22/2021   Stasis dermatitis of both legs 02/04/2021   Anemia 01/09/2021   Essential hypertension 01/16/2020   Smoking 01/16/2020   Personality disorder (HCC) 09/01/2016   History of cocaine dependence (HCC) 09/01/2016   History of alcohol abuse 07/09/2014   Elevated LFTs 04/08/2014   Palpitations - evaluated by Dr. Melburn Popper in the past 02/20/2014   Anxiety 04/02/2012   History of opioid abuse (HCC) 03/18/2012   History of cervical cancer 01/02/2012   Migraine 04/08/2011    Allergies  Allergen Reactions   Doxycycline Nausea And Vomiting    Vomiting even with food   Acetaminophen Other (See Comments)    Intolerance, hx of tylenol OD     There is no immunization history on file for this patient.  Past Surgical History:  Procedure Laterality Date   A*wisdom teeth ext     CERVICAL CONIZATION W/BX  11/30/2011   Procedure: CONIZATION CERVIX WITH BIOPSY;  Surgeon: Marlowe Sax  Stefano Gaul, MD;  Location: [facility];  Service: Gynecology;  Laterality: N/A;   CERVICAL CONIZATION W/BX  03/16/2012   Procedure: CONIZATION CERVIX WITH BIOPSY;  Surgeon: Kirkland Hun, MD;  Location: [facility];  Service: Gynecology;  Laterality: N/A;   CONIZATION CERVIX     x 3   DECORTICATION Right 01/14/2021   Procedure: DECORTICATION;  Surgeon: Loreli Slot, MD;  Location: [facility] OR;  Service: Thoracic;  Laterality: Right;   DILATION AND CURETTAGE OF UTERUS  11/30/2011   Procedure: DILATATION AND  CURETTAGE;  Surgeon: Janine Limbo, MD;  Location: [facility];  Service: Gynecology;  Laterality: N/A;   svd     x 1   VIDEO ASSISTED THORACOSCOPY (VATS)/EMPYEMA Right 01/14/2021   Procedure: VIDEO ASSISTED THORACOSCOPY (VATS)/DRAIN EMPYEMA;  Surgeon: Loreli Slot, MD;  Location: [facility] OR;  Service: Thoracic;  Laterality: Right;    Social History   Tobacco Use   Smoking status: Every Day    Current packs/day: 0.25    Average packs/day: 0.8 packs/day for 32.8 years (26.2 ttl pk-yrs)    Types: Cigarettes    Start date: 1992   Smokeless tobacco: Never   Tobacco comments:    Has cut down to 4-5 cigarettes per day  Vaping Use   Vaping status: Never Used  Substance Use Topics   Alcohol use: Yes    Alcohol/week: 10.0 standard drinks of alcohol    Types: 10 Standard drinks or equivalent per week    Comment: drinks ~qod (hard seltzer) 2-3 times a week   Drug use: Not Currently    Types: Heroin, Cocaine    Comment: Abused rx drugs - on methadone 08/2011, stopped methadone  2007    Family History  Problem Relation Age of Onset   Cervical cancer Mother    Breast cancer Mother    Cancer Mother 74       Breast & cervical    Thyroid disease Mother    Stroke Maternal Grandmother    COPD Maternal Grandfather        Emphysema   Hypertension Paternal Grandfather    Stroke Paternal Grandfather    Heart attack Paternal Grandfather    Cancer Father 51       prostate   Hyperlipidemia Father    Other Paternal Grandmother        "old age" mid 66s        10/12/2023    3:02 PM 10/05/2023    2:16 PM  GAD 7 : Generalized Anxiety Score  Nervous, Anxious, on Edge 2 3  Control/stop worrying 2 2  Worry too much - different things 3 3  Trouble relaxing 3 3  Restless 2 2  Easily annoyed or irritable 3 3  Afraid - awful might happen 0 0  Total GAD 7 Score 15 16  Anxiety Difficulty Not difficult at all Not difficult at all       10/12/2023    3:01 PM 10/05/2023    2:15 PM 03/23/2023     9:56 AM  Depression screen PHQ 2/9  Decreased Interest 0 0 0  Down, Depressed, Hopeless 0 0 0  PHQ - 2 Score 0 0 0  Altered sleeping 3 2   Tired, decreased energy 2 2   Change in appetite 3 2   Feeling bad or failure about yourself  0 0   Trouble concentrating 0 0   Moving slowly or fidgety/restless 0 0   Suicidal thoughts 0 0   PHQ-9 Score  8 6   Difficult doing work/chores Not difficult at all Not difficult at all     BP Readings from Last 3 Encounters:  10/12/23 (!) 164/94  10/05/23 (!) 190/130  04/27/23 (!) 146/90    Wt Readings from Last 3 Encounters:  10/12/23 143 lb (64.9 kg)  10/05/23 142 lb (64.4 kg)  04/27/23 147 lb 6.4 oz (66.9 kg)    BP (!) 164/94 (BP Location: Left Arm, Patient Position: Sitting, Cuff Size: Normal)   Pulse 74   Ht 5' (1.524 m)   Wt 143 lb (64.9 kg)   LMP 04/23/2019 (Approximate)   SpO2 94%   BMI 27.93 kg/m   Physical Exam Vitals and nursing note reviewed.  Constitutional:      Appearance: Normal appearance.  Cardiovascular:     Rate and Rhythm: Normal rate.  Pulmonary:     Effort: Pulmonary effort is normal.  Abdominal:     General: There is no distension.  Musculoskeletal:        General: Normal range of motion.  Skin:    General: Skin is warm and dry.     Comments: Scattered scabbed papules of the right shin with minimal erythema, mildly tender to palpation but without significant edema.  Left lateral ankle with severely tender 4 cm fluctuant abscess. Bilateral feet dry and cracked especially around the calcaneus.  Minor wounds of the hand none of which are obviously infected   Neurological:     Mental Status: She is alert and oriented to person, place, and time.     Gait: Gait is intact.  Psychiatric:        Mood and Affect: Mood and affect normal.     Recent Labs     Component Value Date/Time   NA 126 (L) 10/05/2023 1509   NA 143 07/21/2014 1343   K 4.0 10/05/2023 1509   K 3.7 07/21/2014 1343   CL 85 (L)  10/05/2023 1509   CO2 23 10/05/2023 1509   CO2 25 07/21/2014 1343   GLUCOSE 104 (H) 10/05/2023 1509   GLUCOSE 126 (H) 03/23/2023 1129   GLUCOSE 95 07/21/2014 1343   BUN 4 (L) 10/05/2023 1509   BUN 4.6 (L) 07/21/2014 1343   CREATININE 0.54 (L) 10/05/2023 1509   CREATININE 0.6 07/21/2014 1343   CALCIUM 9.5 10/05/2023 1509   CALCIUM 8.9 07/21/2014 1343   PROT 8.2 10/05/2023 1509   PROT 7.0 07/21/2014 1343   ALBUMIN 4.4 10/05/2023 1509   ALBUMIN 3.2 (L) 07/21/2014 1343   AST 151 (H) 10/05/2023 1509   AST 301 (HH) 07/21/2014 1343   ALT 44 (H) 10/05/2023 1509   ALT 103 (H) 07/21/2014 1343   ALKPHOS 114 10/05/2023 1509   ALKPHOS 123 07/21/2014 1343   BILITOT 0.4 10/05/2023 1509   BILITOT 0.72 07/21/2014 1343   GFRNONAA >60 03/23/2023 1129   GFRAA 142 01/26/2021 1223    Lab Results  Component Value Date   WBC 5.6 10/05/2023   HGB 12.1 10/05/2023   HCT 35.3 10/05/2023   MCV 94 10/05/2023   PLT 179 10/05/2023   Lab Results  Component Value Date   HGBA1C 5.8 (H) 10/05/2023   Lab Results  Component Value Date   CHOL 177 03/30/2023   HDL 79 03/30/2023   LDLCALC 83 03/30/2023   TRIG 84 03/30/2023   CHOLHDL 2.2 03/30/2023   Lab Results  Component Value Date   TSH 1.380 03/30/2023     Assessment and Plan:  1. Abscess of skin  of left ankle Continue Bactrim for full course, emphasized the importance of making an urgent appointment with podiatry, with whom she is already established.  Encouraged her to mention her abscess when she calls to make appointment.  She plans to call today.  Discussed she will likely need incision and drainage of the abscess, to be performed by podiatry or ED.  2. Cellulitis of right lower extremity Improving, plan as above.  3. Essential hypertension Discussed option to increase propranolol versus add another antihypertensive.  Through shared decision making we elected to increase propranolol dose to 60 mg twice daily with close follow-up in 2  weeks and continued home blood pressure monitoring. - propranolol (INDERAL) 60 MG tablet; Take 1 tablet (60 mg total) by mouth 2 (two) times daily.  Dispense: 60 tablet; Refill: 1  4. Palpitations Plan as above - propranolol (INDERAL) 60 MG tablet; Take 1 tablet (60 mg total) by mouth 2 (two) times daily.  Dispense: 60 tablet; Refill: 1   Return in about 2 weeks (around 10/26/2023) for OV f/u HTN.    Alvester Morin, PA-C, DMSc, Nutritionist [facility] [phone]

## 2023-10-18 ENCOUNTER — Ambulatory Visit: Payer: MEDICAID | Attending: Physician Assistant

## 2023-11-01 ENCOUNTER — Telehealth: Payer: Self-pay | Admitting: Physician Assistant

## 2023-11-01 NOTE — Telephone Encounter (Signed)
Informed of upcoming RUQ ultrasound scheduled for 11/06/2023 at 8 AM at [facility].  Instructions given to show up at 7:45 AM, fasting.  Patient verbalizes understanding.  Also reminded to try to log into MyChart.

## 2023-11-06 ENCOUNTER — Ambulatory Visit: Payer: MEDICAID | Attending: Physician Assistant

## 2023-11-27 ENCOUNTER — Ambulatory Visit: Payer: MEDICAID | Admitting: Physician Assistant

## 2023-12-01 ENCOUNTER — Ambulatory Visit: Payer: MEDICAID | Admitting: Physician Assistant

## 2023-12-04 ENCOUNTER — Ambulatory Visit: Payer: MEDICAID | Admitting: Physician Assistant

## 2023-12-23 ENCOUNTER — Other Ambulatory Visit: Payer: Self-pay | Admitting: Physician Assistant

## 2023-12-23 DIAGNOSIS — I10 Essential (primary) hypertension: Secondary | ICD-10-CM

## 2023-12-23 DIAGNOSIS — R002 Palpitations: Secondary | ICD-10-CM

## 2024-01-10 ENCOUNTER — Ambulatory Visit (INDEPENDENT_AMBULATORY_CARE_PROVIDER_SITE_OTHER): Payer: MEDICAID | Admitting: Physician Assistant

## 2024-01-10 ENCOUNTER — Encounter: Payer: Self-pay | Admitting: Physician Assistant

## 2024-01-10 VITALS — BP 180/90 | HR 78 | Temp 97.7°F | Ht 60.0 in | Wt 138.0 lb

## 2024-01-10 DIAGNOSIS — R7989 Other specified abnormal findings of blood chemistry: Secondary | ICD-10-CM

## 2024-01-10 DIAGNOSIS — L259 Unspecified contact dermatitis, unspecified cause: Secondary | ICD-10-CM

## 2024-01-10 DIAGNOSIS — R002 Palpitations: Secondary | ICD-10-CM | POA: Diagnosis not present

## 2024-01-10 DIAGNOSIS — I10 Essential (primary) hypertension: Secondary | ICD-10-CM | POA: Diagnosis not present

## 2024-01-10 MED ORDER — PROPRANOLOL HCL 60 MG PO TABS: 60.0000 mg | ORAL_TABLET | Freq: Two times a day (BID) | ORAL | 1 refills | Status: DC

## 2024-01-10 MED ORDER — TRIAMCINOLONE ACETONIDE 0.1 % EX CREA
1.0000 | TOPICAL_CREAM | Freq: Two times a day (BID) | CUTANEOUS | 1 refills | Status: DC
Start: 2024-01-10 — End: 2024-04-23

## 2024-01-10 NOTE — Progress Notes (Signed)
Date:  01/10/2024   Name:  [name]   DOB:  Oct 23, 1976   MRN:  [id number]   Chief Complaint: Hypertension (Home reading are normal ), Rash, and Hand Pain (X1 month, both hands,Random cuts comes on hands, bleeding when the open )  Rash This is a new problem. Episode onset: 2-3 weeks. The problem has been gradually worsening since onset. The affected locations include the right arm, left arm, left lower leg and right lower leg. The rash is characterized by pain, redness and burning. She was exposed to nothing. Past treatments include anti-itch cream, cold compress and moisturizer. The treatment provided mild relief.   [name] presents slightly overdue for 6-week follow-up on HTN, left ankle abscess, palpitations, and alcohol use disorder.  She reports the ankle abscess is better following consult with podiatry Dr. Allena Katz.    Alcohol use disorder -still drinking every day, between 2-3 drinks usually (hard seltzer).  She missed two RUQ ultrasound appointments in a row despite reminders.   We increased propranolol dose to 60 mg twice daily, which she was taking with good compliance and reasonable control of blood pressure per her report, though imperfect with home BP monitoring.  She ran out of this dose, but took a 40 mg tablet this morning.  Palpitations have improved.  Secondarily, she reports a rash of bilateral hands and forearms R>L, skin is "splitting".  Reports she had shingles several times and it felt like this.  Has chronically dry skin, uses lotion, uses dishwashing gloves.  Has never seen a dermatologist.   Medication list has been reviewed and updated.  Current Meds  Medication Sig   cloNIDine (CATAPRES) 0.1 MG tablet Take 1 tablet (0.1 mg total) by mouth 3 (three) times daily as needed (for BP >170/110).   diphenhydrAMINE (BENADRYL) 25 MG tablet Take 25 mg by mouth daily as needed for allergies.   fluticasone (FLONASE) 50 MCG/ACT nasal spray Place 2 sprays into both  nostrils daily.   ibuprofen (ADVIL) 600 MG tablet Take 1 tablet (600 mg total) by mouth every 8 (eight) hours as needed.   Omega-3 Fatty Acids (FISH OIL) 1200 MG CAPS Take 1,200 mg by mouth.   triamcinolone cream (KENALOG) 0.1 % Apply 1 Application topically 2 (two) times daily.   [DISCONTINUED] propranolol (INDERAL) 60 MG tablet Take 1 tablet (60 mg total) by mouth 2 (two) times daily.     Review of Systems  Skin:  Positive for rash.    Patient Active Problem List   Diagnosis Date Noted   Prediabetes 10/09/2023   Wound of ankle 03/30/2023   Wound drainage 12/20/2022   Onychomycosis 09/08/2022   Sinusitis, chronic 08/04/2021   Hot flashes 04/22/2021   History of HPV infection 04/22/2021   Stasis dermatitis of both legs 02/04/2021   Anemia 01/09/2021   Essential hypertension 01/16/2020   Smoking 01/16/2020   Personality disorder (HCC) 09/01/2016   History of cocaine dependence (HCC) 09/01/2016   History of alcohol abuse 07/09/2014   Elevated LFTs 04/08/2014   Palpitations - evaluated by Dr. Melburn Popper in the past 02/20/2014   Anxiety 04/02/2012   History of opioid abuse (HCC) 03/18/2012   History of cervical cancer 01/02/2012   Migraine 04/08/2011    Allergies  Allergen Reactions   Doxycycline Nausea And Vomiting    Vomiting even with food   Acetaminophen Other (See Comments)    Intolerance, hx of tylenol OD     There is no immunization history on file for  this patient.  Past Surgical History:  Procedure Laterality Date   A*wisdom teeth ext     CERVICAL CONIZATION W/BX  11/30/2011   Procedure: CONIZATION CERVIX WITH BIOPSY;  Surgeon: Janine Limbo, MD;  Location: [facility];  Service: Gynecology;  Laterality: N/A;   CERVICAL CONIZATION W/BX  03/16/2012   Procedure: CONIZATION CERVIX WITH BIOPSY;  Surgeon: Kirkland Hun, MD;  Location: [facility];  Service: Gynecology;  Laterality: N/A;   CONIZATION CERVIX     x 3   DECORTICATION Right 01/14/2021   Procedure:  DECORTICATION;  Surgeon: Loreli Slot, MD;  Location: [facility] OR;  Service: Thoracic;  Laterality: Right;   DILATION AND CURETTAGE OF UTERUS  11/30/2011   Procedure: DILATATION AND CURETTAGE;  Surgeon: Janine Limbo, MD;  Location: [facility];  Service: Gynecology;  Laterality: N/A;   svd     x 1   VIDEO ASSISTED THORACOSCOPY (VATS)/EMPYEMA Right 01/14/2021   Procedure: VIDEO ASSISTED THORACOSCOPY (VATS)/DRAIN EMPYEMA;  Surgeon: Loreli Slot, MD;  Location: [facility] OR;  Service: Thoracic;  Laterality: Right;    Social History   Tobacco Use   Smoking status: Every Day    Current packs/day: 0.25    Average packs/day: 0.8 packs/day for 33.1 years (26.3 ttl pk-yrs)    Types: Cigarettes    Start date: 1992   Smokeless tobacco: Never   Tobacco comments:    Has cut down to 4-5 cigarettes per day  Vaping Use   Vaping status: Never Used  Substance Use Topics   Alcohol use: Yes    Alcohol/week: 20.0 standard drinks of alcohol    Types: 20 Standard drinks or equivalent per week    Comment: drinks hard seltzer daily   Drug use: Not Currently    Types: Heroin, Cocaine    Comment: Abused rx drugs - on methadone 08/2011, stopped methadone  2007    Family History  Problem Relation Age of Onset   Cervical cancer Mother    Breast cancer Mother    Cancer Mother 81       Breast & cervical    Thyroid disease Mother    Stroke Maternal Grandmother    COPD Maternal Grandfather        Emphysema   Hypertension Paternal Grandfather    Stroke Paternal Grandfather    Heart attack Paternal Grandfather    Cancer Father 22       prostate   Hyperlipidemia Father    Other Paternal Grandmother        "old age" mid 68s        01/10/2024    2:26 PM 10/12/2023    3:02 PM 10/05/2023    2:16 PM  GAD 7 : Generalized Anxiety Score  Nervous, Anxious, on Edge 1 2 3   Control/stop worrying 2 2 2   Worry too much - different things 3 3 3   Trouble relaxing 3 3 3   Restless 2 2 2   Easily annoyed or  irritable 3 3 3   Afraid - awful might happen 1 0 0  Total GAD 7 Score 15 15 16   Anxiety Difficulty Somewhat difficult Not difficult at all Not difficult at all       01/10/2024    2:26 PM 10/12/2023    3:01 PM 10/05/2023    2:15 PM  Depression screen PHQ 2/9  Decreased Interest 0 0 0  Down, Depressed, Hopeless 0 0 0  PHQ - 2 Score 0 0 0  Altered sleeping  3 2  Tired, decreased energy  2 2  Change in appetite  3 2  Feeling bad or failure about yourself   0 0  Trouble concentrating  0 0  Moving slowly or fidgety/restless  0 0  Suicidal thoughts  0 0  PHQ-9 Score  8 6  Difficult doing work/chores  Not difficult at all Not difficult at all    BP Readings from Last 3 Encounters:  01/10/24 (!) 180/90  10/12/23 (!) 164/94  10/05/23 (!) 190/130    Wt Readings from Last 3 Encounters:  01/10/24 138 lb (62.6 kg)  10/12/23 143 lb (64.9 kg)  10/05/23 142 lb (64.4 kg)    BP (!) 180/90 (BP Location: Left Arm, Patient Position: Sitting, Cuff Size: Normal)   Pulse 78   Temp 97.7 F (36.5 C)   Ht 5' (1.524 m)   Wt 138 lb (62.6 kg)   LMP 04/23/2019 (Approximate)   SpO2 98%   BMI 26.95 kg/m   Physical Exam Vitals and nursing note reviewed.  Constitutional:      Appearance: Normal appearance.  Cardiovascular:     Rate and Rhythm: Normal rate.  Pulmonary:     Effort: Pulmonary effort is normal.  Abdominal:     General: There is no distension.  Musculoskeletal:        General: Normal range of motion.  Skin:    General: Skin is warm and dry.     Findings: Rash present. Rash is macular.     Comments: Skin appears dry in several spots but especially bilateral hands, forearms, shins.  Right anterior forearm with pink macular rash especially distally; similar on left but not as prominent. The rash is peculiarly tender out of proportion to exam.  Several fissures of the fingers (see picture). She points to scabbing spots bilateral ear lobes.   Neurological:     Mental Status: She  is alert and oriented to person, place, and time.     Gait: Gait is intact.  Psychiatric:        Mood and Affect: Mood and affect normal.     Recent Labs     Component Value Date/Time   NA 126 (L) 10/05/2023 1509   NA 143 07/21/2014 1343   K 4.0 10/05/2023 1509   K 3.7 07/21/2014 1343   CL 85 (L) 10/05/2023 1509   CO2 23 10/05/2023 1509   CO2 25 07/21/2014 1343   GLUCOSE 104 (H) 10/05/2023 1509   GLUCOSE 126 (H) 03/23/2023 1129   GLUCOSE 95 07/21/2014 1343   BUN 4 (L) 10/05/2023 1509   BUN 4.6 (L) 07/21/2014 1343   CREATININE 0.54 (L) 10/05/2023 1509   CREATININE 0.6 07/21/2014 1343   CALCIUM 9.5 10/05/2023 1509   CALCIUM 8.9 07/21/2014 1343   PROT 8.2 10/05/2023 1509   PROT 7.0 07/21/2014 1343   ALBUMIN 4.4 10/05/2023 1509   ALBUMIN 3.2 (L) 07/21/2014 1343   AST 151 (H) 10/05/2023 1509   AST 301 (HH) 07/21/2014 1343   ALT 44 (H) 10/05/2023 1509   ALT 103 (H) 07/21/2014 1343   ALKPHOS 114 10/05/2023 1509   ALKPHOS 123 07/21/2014 1343   BILITOT 0.4 10/05/2023 1509   BILITOT 0.72 07/21/2014 1343   GFRNONAA >60 03/23/2023 1129   GFRAA 142 01/26/2021 1223    Lab Results  Component Value Date   WBC 5.6 10/05/2023   HGB 12.1 10/05/2023   HCT 35.3 10/05/2023   MCV 94 10/05/2023   PLT 179 10/05/2023   Lab Results  Component Value Date   HGBA1C 5.8 (H) 10/05/2023   Lab Results  Component Value Date   CHOL 177 03/30/2023   HDL 79 03/30/2023   LDLCALC 83 03/30/2023   TRIG 84 03/30/2023   CHOLHDL 2.2 03/30/2023   Lab Results  Component Value Date   TSH 1.380 03/30/2023     Assessment and Plan:  1. Essential hypertension (Primary) Refill propranolol, encouraged the importance of home BP monitoring, patient to bring a log for my review next time.  Check TSH today. - propranolol (INDERAL) 60 MG tablet; Take 1 tablet (60 mg total) by mouth 2 (two) times daily.  Dispense: 180 tablet; Refill: 1 - TSH  2. Palpitations Plan as above. - propranolol (INDERAL) 60  MG tablet; Take 1 tablet (60 mg total) by mouth 2 (two) times daily.  Dispense: 180 tablet; Refill: 1  3. Elevated LFTs Repeat CMP, encouraged to make RUQ ultrasound appointment.  Number given for this. - Comprehensive metabolic panel  4. Contact dermatitis, unspecified contact dermatitis type, unspecified trigger Uncertain etiology.  Patient reassured this is not shingles.  Will prescribe triamcinolone topical as below, continue lotion, try to identify irritant/trigger.  Referring to dermatology for further evaluation and management. - triamcinolone cream (KENALOG) 0.1 %; Apply 1 Application topically 2 (two) times daily.  Dispense: 30 g; Refill: 1 - TSH   Return in about 2 weeks (around 01/24/2024) for OV f/u HTN, rash.    Alvester Morin, PA-C, DMSc, Nutritionist [facility] [phone]

## 2024-01-10 NOTE — Patient Instructions (Signed)
-  It was a pleasure to see you today! Please review your visit summary for helpful information -Lab results are usually available within 1-2 days and we will call once reviewed -I would encourage you to follow your care via MyChart where you can access lab results, notes, messages, and more -If you feel that we did a nice job today, please complete your after-visit survey and leave us a Google review! Your CMA today was Kieandra and your provider was Dan Waddell, PA-C, DMSc -Please return for follow-up in about 2 weeks 

## 2024-01-11 LAB — COMPREHENSIVE METABOLIC PANEL
ALT: 20 [IU]/L (ref 0–32)
AST: 82 [IU]/L — ABNORMAL HIGH (ref 0–40)
Albumin: 4.4 g/dL (ref 3.9–4.9)
Alkaline Phosphatase: 84 [IU]/L (ref 44–121)
BUN/Creatinine Ratio: 5 — ABNORMAL LOW (ref 9–23)
BUN: 3 mg/dL — ABNORMAL LOW (ref 6–24)
Bilirubin Total: 0.3 mg/dL (ref 0.0–1.2)
CO2: 21 mmol/L (ref 20–29)
Calcium: 9.2 mg/dL (ref 8.7–10.2)
Chloride: 87 mmol/L — ABNORMAL LOW (ref 96–106)
Creatinine, Ser: 0.55 mg/dL — ABNORMAL LOW (ref 0.57–1.00)
Globulin, Total: 3.6 g/dL (ref 1.5–4.5)
Glucose: 103 mg/dL — ABNORMAL HIGH (ref 70–99)
Potassium: 4.3 mmol/L (ref 3.5–5.2)
Sodium: 125 mmol/L — ABNORMAL LOW (ref 134–144)
Total Protein: 8 g/dL (ref 6.0–8.5)
eGFR: 112 mL/min/{1.73_m2} (ref >59)

## 2024-01-11 LAB — TSH: TSH: 2.74 u[IU]/mL (ref 0.450–4.500)

## 2024-01-19 ENCOUNTER — Ambulatory Visit: Payer: MEDICAID

## 2024-01-22 ENCOUNTER — Telehealth: Payer: Self-pay | Admitting: Physician Assistant

## 2024-01-22 ENCOUNTER — Other Ambulatory Visit: Payer: MEDICAID

## 2024-01-22 NOTE — Telephone Encounter (Signed)
Referral Request - Did the patient discuss referral with their provider in the last year? yes  Type of order/referral and detailed reason for visit: feet dryness  Preference of office, provider, location: ?  If referral order, have you been seen by this specialty before? no (If Yes, this issue or another issue? When? Where?  Can we respond through MyChart? yes

## 2024-01-23 NOTE — Telephone Encounter (Signed)
Noted. Will discuss at our visit this week.

## 2024-01-25 ENCOUNTER — Ambulatory Visit
Admission: RE | Admit: 2024-01-25 | Discharge: 2024-01-25 | Disposition: A | Discharge status: Home / Self Care | Payer: MEDICAID | Source: Ambulatory Visit | Attending: Physician Assistant | Admitting: Physician Assistant

## 2024-01-25 DIAGNOSIS — R7989 Other specified abnormal findings of blood chemistry: Secondary | ICD-10-CM | POA: Diagnosis present

## 2024-01-26 ENCOUNTER — Other Ambulatory Visit: Payer: Self-pay | Admitting: Physician Assistant

## 2024-01-26 ENCOUNTER — Ambulatory Visit: Payer: Self-pay | Admitting: Physician Assistant

## 2024-01-26 DIAGNOSIS — I160 Hypertensive urgency: Secondary | ICD-10-CM

## 2024-01-26 DIAGNOSIS — I10 Essential (primary) hypertension: Secondary | ICD-10-CM

## 2024-01-29 ENCOUNTER — Encounter: Payer: Self-pay | Admitting: Physician Assistant

## 2024-01-29 DIAGNOSIS — K760 Fatty (change of) liver, not elsewhere classified: Secondary | ICD-10-CM | POA: Insufficient documentation

## 2024-01-29 DIAGNOSIS — K8020 Calculus of gallbladder without cholecystitis without obstruction: Secondary | ICD-10-CM | POA: Insufficient documentation

## 2024-01-29 NOTE — Telephone Encounter (Signed)
 Refilled 10/05/23 # 90 with 3 refills. Requested Prescriptions  Refused Prescriptions Disp Refills   cloNIDine  (CATAPRES ) 0.1 MG tablet [Pharmacy Med Name: CLONIDINE  0.1MG  TABLETS] 90 tablet 3    Sig: TAKE 1 TABLET BY MOUTH THREE TIMES DAILY AS NEEDED FOR BLOOD PRESSURE GREATER THAN 170/110     Cardiovascular:  Alpha-2 Agonists Failed - 01/29/2024 11:07 AM      Failed - Last BP in normal range    BP Readings from Last 1 Encounters:  01/10/24 (!) 180/90         Passed - Last Heart Rate in normal range    Pulse Readings from Last 1 Encounters:  01/10/24 78         Passed - Valid encounter within last 6 months    Recent Outpatient Visits           2 weeks ago Essential hypertension   [place] [facility] at [facility], Arleen Lacer, [place]   3 months ago Abscess of skin of left ankle   [facility] at [facility], Arleen Lacer, [place]   3 months ago Essential hypertension   [facility] at [facility], Arleen Lacer, [place]

## 2024-02-01 ENCOUNTER — Ambulatory Visit: Payer: MEDICAID | Admitting: Physician Assistant

## 2024-02-21 ENCOUNTER — Other Ambulatory Visit: Payer: Self-pay | Admitting: Physician Assistant

## 2024-02-21 ENCOUNTER — Encounter: Payer: Self-pay | Admitting: Physician Assistant

## 2024-02-21 ENCOUNTER — Ambulatory Visit (INDEPENDENT_AMBULATORY_CARE_PROVIDER_SITE_OTHER): Payer: MEDICAID | Admitting: Physician Assistant

## 2024-02-21 VITALS — BP 144/92 | HR 89 | Temp 97.8°F | Ht 60.0 in | Wt 142.0 lb

## 2024-02-21 DIAGNOSIS — I160 Hypertensive urgency: Secondary | ICD-10-CM | POA: Diagnosis not present

## 2024-02-21 DIAGNOSIS — R21 Rash and other nonspecific skin eruption: Secondary | ICD-10-CM | POA: Diagnosis not present

## 2024-02-21 DIAGNOSIS — K429 Umbilical hernia without obstruction or gangrene: Secondary | ICD-10-CM | POA: Diagnosis not present

## 2024-02-21 DIAGNOSIS — I10 Essential (primary) hypertension: Secondary | ICD-10-CM | POA: Diagnosis not present

## 2024-02-21 MED ORDER — CLONIDINE HCL 0.1 MG PO TABS
0.1000 mg | ORAL_TABLET | ORAL | 0 refills | Status: DC | PRN
Start: 2024-02-21 — End: 2024-03-29

## 2024-02-21 MED ORDER — SULFAMETHOXAZOLE-TRIMETHOPRIM 800-160 MG PO TABS
1.0000 | ORAL_TABLET | Freq: Two times a day (BID) | ORAL | 0 refills | Status: DC
Start: 2024-02-21 — End: 2024-03-29

## 2024-02-21 MED ORDER — VALSARTAN 80 MG PO TABS
80.0000 mg | ORAL_TABLET | Freq: Every day | ORAL | 1 refills | Status: DC
Start: 2024-02-21 — End: 2024-04-17

## 2024-02-21 NOTE — Progress Notes (Signed)
 Date:  02/21/2024   Name:  [name]   DOB:  May 24, 1976   MRN:  [id number]   Chief Complaint: Rash  Rash This is a new problem. Episode onset: X1 month. The problem has been gradually worsening since onset. The affected locations include the right arm, left arm, right ear and left ear. The rash is characterized by itchiness, redness and pain. She was exposed to nothing. Treatments tried: benadryl, creams. The treatment provided mild relief.   Talishia is here overdue for follow-up on rash of bilateral hands and arms with "splitting skin" evaluated by me 01/10/2024 and sent referral to dermatology, but she has not heard anything from them.  I also prescribed her triamcinolone cream which she states is not helping.  She is not complaining of painful scabbing bilateral earlobes L>R.  Known history of MRSA.  Recent RUQ ultrasound suggests cholelithiasis without cholecystitis, and hepatic steatosis.  Results discussed with patient today.   Toward the end of our visit, patient asked if the rash might have anything to do with "the thing on my stomach".  When asked about this, it seems that she has had a painful mass near the umbilicus for the last 3 to 4 weeks, gradually worsening.   Medication list has been reviewed and updated.  Current Meds  Medication Sig   diphenhydrAMINE (BENADRYL) 25 MG tablet Take 25 mg by mouth daily as needed for allergies.   fluticasone (FLONASE) 50 MCG/ACT nasal spray Place 2 sprays into both nostrils daily.   ibuprofen (ADVIL) 600 MG tablet Take 1 tablet (600 mg total) by mouth every 8 (eight) hours as needed.   Omega-3 Fatty Acids (FISH OIL) 1200 MG CAPS Take 1,200 mg by mouth.   propranolol (INDERAL) 60 MG tablet Take 1 tablet (60 mg total) by mouth 2 (two) times daily.   triamcinolone cream (KENALOG) 0.1 % Apply 1 Application topically 2 (two) times daily.   valsartan (DIOVAN) 80 MG tablet Take 1 tablet (80 mg total) by mouth daily.   [DISCONTINUED]  cloNIDine (CATAPRES) 0.1 MG tablet Take 1 tablet (0.1 mg total) by mouth 3 (three) times daily as needed (for BP >170/110).     Review of Systems  Skin:  Positive for rash.    Patient Active Problem List   Diagnosis Date Noted   Hepatic steatosis 01/29/2024   Cholelithiasis without cholecystitis 01/29/2024   Prediabetes 10/09/2023   Wound of ankle 03/30/2023   Wound drainage 12/20/2022   Onychomycosis 09/08/2022   Sinusitis, chronic 08/04/2021   Hot flashes 04/22/2021   History of HPV infection 04/22/2021   Stasis dermatitis of both legs 02/04/2021   Anemia 01/09/2021   Essential hypertension 01/16/2020   Smoking 01/16/2020   Personality disorder (HCC) 09/01/2016   History of cocaine dependence (HCC) 09/01/2016   History of alcohol abuse 07/09/2014   Palpitations - evaluated by Dr. Melburn Popper in the past 02/20/2014   Anxiety 04/02/2012   History of opioid abuse (HCC) 03/18/2012   History of cervical cancer 01/02/2012   Migraine 04/08/2011    Allergies  Allergen Reactions   Doxycycline Nausea And Vomiting    Vomiting even with food   Acetaminophen Other (See Comments)    Intolerance, hx of tylenol OD     There is no immunization history on file for this patient.  Past Surgical History:  Procedure Laterality Date   A*wisdom teeth ext     CERVICAL CONIZATION W/BX  11/30/2011   Procedure: CONIZATION CERVIX WITH BIOPSY;  Surgeon: Janine Limbo, MD;  Location: [facility];  Service: Gynecology;  Laterality: N/A;   CERVICAL CONIZATION W/BX  03/16/2012   Procedure: CONIZATION CERVIX WITH BIOPSY;  Surgeon: Kirkland Hun, MD;  Location: [facility];  Service: Gynecology;  Laterality: N/A;   CONIZATION CERVIX     x 3   DECORTICATION Right 01/14/2021   Procedure: DECORTICATION;  Surgeon: Loreli Slot, MD;  Location: [facility];  Service: Thoracic;  Laterality: Right;   DILATION AND CURETTAGE OF UTERUS  11/30/2011   Procedure: DILATATION AND CURETTAGE;  Surgeon: Janine Limbo, MD;  Location: [facility];  Service: Gynecology;  Laterality: N/A;   svd     x 1   VIDEO ASSISTED THORACOSCOPY (VATS)/EMPYEMA Right 01/14/2021   Procedure: VIDEO ASSISTED THORACOSCOPY (VATS)/DRAIN EMPYEMA;  Surgeon: Loreli Slot, MD;  Location: [facility] OR;  Service: Thoracic;  Laterality: Right;    Social History   Tobacco Use   Smoking status: Every Day    Current packs/day: 0.25    Average packs/day: 0.8 packs/day for 33.2 years (26.3 ttl pk-yrs)    Types: Cigarettes    Start date: 1992   Smokeless tobacco: Never   Tobacco comments:    Has cut down to 4-5 cigarettes per day  Vaping Use   Vaping status: Never Used  Substance Use Topics   Alcohol use: Yes    Alcohol/week: 20.0 standard drinks of alcohol    Types: 20 Standard drinks or equivalent per week    Comment: drinks hard seltzer daily   Drug use: Not Currently    Types: Heroin, Cocaine    Comment: Abused rx drugs - on methadone 08/2011, stopped methadone  2007    Family History  Problem Relation Age of Onset   Cervical cancer Mother    Breast cancer Mother    Cancer Mother 7       Breast & cervical    Thyroid disease Mother    Stroke Maternal Grandmother    COPD Maternal Grandfather        Emphysema   Hypertension Paternal Grandfather    Stroke Paternal Grandfather    Heart attack Paternal Grandfather    Cancer Father 22       prostate   Hyperlipidemia Father    Other Paternal Grandmother        "old age" mid 69s        02/21/2024    3:59 PM 01/10/2024    2:26 PM 10/12/2023    3:02 PM 10/05/2023    2:16 PM  GAD 7 : Generalized Anxiety Score  Nervous, Anxious, on Edge 1 1 2 3   Control/stop worrying 1 2 2 2   Worry too much - different things 1 3 3 3   Trouble relaxing 2 3 3 3   Restless 0 2 2 2   Easily annoyed or irritable 2 3 3 3   Afraid - awful might happen 0 1 0 0  Total GAD 7 Score 7 15 15 16   Anxiety Difficulty Somewhat difficult Somewhat difficult Not difficult at all Not difficult at  all       02/21/2024    3:59 PM 01/10/2024    2:26 PM 10/12/2023    3:01 PM  Depression screen PHQ 2/9  Decreased Interest 1 0 0  Down, Depressed, Hopeless 0 0 0  PHQ - 2 Score 1 0 0  Altered sleeping   3  Tired, decreased energy   2  Change in appetite   3  Feeling bad  or failure about yourself    0  Trouble concentrating   0  Moving slowly or fidgety/restless   0  Suicidal thoughts   0  PHQ-9 Score   8  Difficult doing work/chores   Not difficult at all    BP Readings from Last 3 Encounters:  02/21/24 (!) 144/92  01/10/24 (!) 180/90  10/12/23 (!) 164/94    Wt Readings from Last 3 Encounters:  02/21/24 142 lb (64.4 kg)  01/10/24 138 lb (62.6 kg)  10/12/23 143 lb (64.9 kg)    BP (!) 144/92 (BP Location: Left Arm, Patient Position: Sitting, Cuff Size: Normal)   Pulse 89   Temp 97.8 F (36.6 C)   Ht 5' (1.524 m)   Wt 142 lb (64.4 kg)   LMP 04/23/2019 (Approximate)   SpO2 97%   BMI 27.73 kg/m   Physical Exam Vitals and nursing note reviewed.  Constitutional:      Appearance: Normal appearance.  Cardiovascular:     Rate and Rhythm: Normal rate.  Pulmonary:     Effort: Pulmonary effort is normal.  Abdominal:     General: There is no distension.     Tenderness: There is abdominal tenderness.     Hernia: A hernia is present. Hernia is present in the umbilical area.     Comments: 2-3 cm acutely tender umbilical hernia with subtle purple discoloration.  Severe tenderness out of proportion to exam.  Patient would not allow me to adequately palpate; unable to apply more than fingertip pressure to the site without her wincing and crying.  Musculoskeletal:        General: Normal range of motion.  Skin:    General: Skin is warm and dry.     Findings: Rash present. Rash is macular.     Comments: Skin appears dry in several spots but especially bilateral hands, forearms, shins.  Right anterior forearm with pink macular rash especially distally; similar on left but not as  prominent. The rash is peculiarly tender out of proportion to exam.  Several fissures of the fingers. She points to scabs bilateral ear lobes.   Neurological:     Mental Status: She is alert and oriented to person, place, and time.     Gait: Gait is intact.  Psychiatric:        Mood and Affect: Mood and affect normal.     Recent Labs     Component Value Date/Time   NA 125 (L) 01/10/2024 1514   NA 143 07/21/2014 1343   K 4.3 01/10/2024 1514   K 3.7 07/21/2014 1343   CL 87 (L) 01/10/2024 1514   CO2 21 01/10/2024 1514   CO2 25 07/21/2014 1343   GLUCOSE 103 (H) 01/10/2024 1514   GLUCOSE 126 (H) 03/23/2023 1129   GLUCOSE 95 07/21/2014 1343   BUN 3 (L) 01/10/2024 1514   BUN 4.6 (L) 07/21/2014 1343   CREATININE 0.55 (L) 01/10/2024 1514   CREATININE 0.6 07/21/2014 1343   CALCIUM 9.2 01/10/2024 1514   CALCIUM 8.9 07/21/2014 1343   PROT 8.0 01/10/2024 1514   PROT 7.0 07/21/2014 1343   ALBUMIN 4.4 01/10/2024 1514   ALBUMIN 3.2 (L) 07/21/2014 1343   AST 82 (H) 01/10/2024 1514   AST 301 (HH) 07/21/2014 1343   ALT 20 01/10/2024 1514   ALT 103 (H) 07/21/2014 1343   ALKPHOS 84 01/10/2024 1514   ALKPHOS 123 07/21/2014 1343   BILITOT 0.3 01/10/2024 1514   BILITOT 0.72 07/21/2014 1343  GFRNONAA >60 03/23/2023 1129   GFRAA 142 01/26/2021 1223    Lab Results  Component Value Date   WBC 5.6 10/05/2023   HGB 12.1 10/05/2023   HCT 35.3 10/05/2023   MCV 94 10/05/2023   PLT 179 10/05/2023   Lab Results  Component Value Date   HGBA1C 5.8 (H) 10/05/2023   Lab Results  Component Value Date   CHOL 177 03/30/2023   HDL 79 03/30/2023   LDLCALC 83 03/30/2023   TRIG 84 03/30/2023   CHOLHDL 2.2 03/30/2023   Lab Results  Component Value Date   TSH 2.740 01/10/2024     Assessment and Plan:  1. Umbilical hernia without obstruction and without gangrene (Primary) Discussed with patient this concern takes priority over all other issues today.  High index of suspicion for strangulated  hernia until proven otherwise.  Patient advised to proceed directly to the hospital of her choice for urgent evaluation with imaging and possible need for reduction.  She verbalizes understanding.  I will be on the lookout for correspondence from ED  2. Rash Suspect psoriasis, but given history of MRSA with some golden crusting near the earlobes and the fissures on her hands, will cover with Bactrim as below.  Will follow-up on dermatology referral as well.  - sulfamethoxazole-trimethoprim (BACTRIM DS) 800-160 MG tablet; Take 1 tablet by mouth in the morning and at bedtime for 7 days. Take with food  Dispense: 14 tablet; Refill: 0  3. Essential hypertension Add valsartan 80 mg daily.  Refill clonidine but only for rare use. - cloNIDine (CATAPRES) 0.1 MG tablet; Take 1 tablet (0.1 mg total) by mouth as needed (for BP >170/110).  Dispense: 15 tablet; Refill: 0 - valsartan (DIOVAN) 80 MG tablet; Take 1 tablet (80 mg total) by mouth daily.  Dispense: 30 tablet; Refill: 1  4. Hypertensive urgency Patient to use clonidine only when blood pressure greater than 170/110.  She verbalizes understanding. - cloNIDine (CATAPRES) 0.1 MG tablet; Take 1 tablet (0.1 mg total) by mouth as needed (for BP >170/110).  Dispense: 15 tablet; Refill: 0   Return in about 10 days (around 03/02/2024) for rash, HTN.    Alvester Morin, PA-C, DMSc, Nutritionist [facility] [phone]

## 2024-02-22 ENCOUNTER — Telehealth: Payer: Self-pay | Admitting: Physician Assistant

## 2024-02-22 NOTE — Telephone Encounter (Signed)
 Requested medication (s) are due for refill today - no  Requested medication (s) are on the active medication list -yes  Future visit scheduled -yes  Last refill: 02/21/24 #30 1RF  Notes to clinic: original Rx do not include 90 day supply- sent for office review of request   Requested Prescriptions  Pending Prescriptions Disp Refills   valsartan (DIOVAN) 80 MG tablet [Pharmacy Med Name: VALSARTAN 80MG  TABLETS] 90 tablet     Sig: TAKE 1 TABLET(80 MG) BY MOUTH DAILY     Cardiovascular:  Angiotensin Receptor Blockers Failed - 02/22/2024  1:47 PM      Failed - Cr in normal range and within 180 days    Creatinine  Date Value Ref Range Status  07/21/2014 0.6 0.6 - 1.1 mg/dL Final   Creatinine, Ser  Date Value Ref Range Status  01/10/2024 0.55 (L) 0.57 - 1.00 mg/dL Final         Failed - Last BP in normal range    BP Readings from Last 1 Encounters:  02/21/24 (!) 144/92         Passed - K in normal range and within 180 days    Potassium  Date Value Ref Range Status  01/10/2024 4.3 3.5 - 5.2 mmol/L Final  07/21/2014 3.7 3.5 - 5.1 mEq/L Final         Passed - Patient is not pregnant      Passed - Valid encounter within last 6 months    Recent Outpatient Visits           1 month ago Essential hypertension   [place] [facility] at [facility], [place], [place]   4 months ago Abscess of skin of left ankle   [facility] at [facility], [place], [place]   4 months ago Essential hypertension   [place] [facility] at [facility], [place] Al[place], [place]       Future Appointments             In 1 week Mordecai Maes, [place] Al[place], [place] [facility] at [facility], [facility]               Requested Prescriptions  Pending Prescriptions Disp Refills   valsartan (DIOVAN) 80 MG tablet [Pharmacy Med Name: VALSARTAN 80MG  TABLETS] 90 tablet     Sig: TAKE 1  TABLET(80 MG) BY MOUTH DAILY     Cardiovascular:  Angiotensin Receptor Blockers Failed - 02/22/2024  1:47 PM      Failed - Cr in normal range and within 180 days    Creatinine  Date Value Ref Range Status  07/21/2014 0.6 0.6 - 1.1 mg/dL Final   Creatinine, Ser  Date Value Ref Range Status  01/10/2024 0.55 (L) 0.57 - 1.00 mg/dL Final         Failed - Last BP in normal range    BP Readings from Last 1 Encounters:  02/21/24 (!) 144/92         Passed - K in normal range and within 180 days    Potassium  Date Value Ref Range Status  01/10/2024 4.3 3.5 - 5.2 mmol/L Final  07/21/2014 3.7 3.5 - 5.1 mEq/L Final         Passed - Patient is not pregnant      Passed - Valid encounter within last 6 months    Recent Outpatient Visits  1 month ago Essential hypertension   [facility] at [facility], [place] Al[place], [place]   4 months ago Abscess of skin of left ankle   [facility] at [facility], [place], [place]   4 months ago Essential hypertension   [facility] Medicine at [facility], [place], [place]       Future Appointments             In 1 week Mordecai Maes, [place], [place] [facility] Medicine at [facility], [facility]

## 2024-02-22 NOTE — Telephone Encounter (Signed)
 Spoke with patient via phone regarding concern for strangulated hernia on yesterday's exam. I advised her yesterday to proceed directly to the ED, which she did not. I again emphasized the importance of having this evaluated. She intends to go this afternoon.

## 2024-02-27 ENCOUNTER — Telehealth: Payer: Self-pay | Admitting: Physician Assistant

## 2024-02-27 NOTE — Telephone Encounter (Signed)
 Spoke with patient. She informed me that her dermatology appointment has been scheduled for 05/08/2024. No further action needed.

## 2024-02-27 NOTE — Telephone Encounter (Signed)
 Copied from CRM [id number]. Topic: Referral - Status >> Feb 27, 2024  2:28 PM Fredrica W wrote: Reason for CRM: Patient called to check status of referral has not heard anything back to schedule with dermatology. Referral showing from January. Provider information from referral. Thank You

## 2024-03-05 ENCOUNTER — Ambulatory Visit (INDEPENDENT_AMBULATORY_CARE_PROVIDER_SITE_OTHER): Payer: MEDICAID | Admitting: Physician Assistant

## 2024-03-05 ENCOUNTER — Encounter: Payer: Self-pay | Admitting: Physician Assistant

## 2024-03-05 VITALS — BP 170/90 | HR 75 | Ht 60.0 in | Wt 136.4 lb

## 2024-03-05 DIAGNOSIS — E871 Hypo-osmolality and hyponatremia: Secondary | ICD-10-CM | POA: Diagnosis not present

## 2024-03-05 DIAGNOSIS — K429 Umbilical hernia without obstruction or gangrene: Secondary | ICD-10-CM | POA: Diagnosis not present

## 2024-03-05 DIAGNOSIS — E878 Other disorders of electrolyte and fluid balance, not elsewhere classified: Secondary | ICD-10-CM | POA: Diagnosis not present

## 2024-03-05 DIAGNOSIS — F1421 Cocaine dependence, in remission: Secondary | ICD-10-CM

## 2024-03-05 DIAGNOSIS — F1111 Opioid abuse, in remission: Secondary | ICD-10-CM

## 2024-03-05 DIAGNOSIS — I10 Essential (primary) hypertension: Secondary | ICD-10-CM | POA: Diagnosis not present

## 2024-03-05 DIAGNOSIS — R21 Rash and other nonspecific skin eruption: Secondary | ICD-10-CM

## 2024-03-05 NOTE — Assessment & Plan Note (Addendum)
 Complete UDS today if able.  Will try to find her a Suboxone provider.

## 2024-03-05 NOTE — Assessment & Plan Note (Signed)
Check labs as below.  

## 2024-03-05 NOTE — Assessment & Plan Note (Signed)
 Patient reassured I am no longer concerned with strangulation.  Will order CT abdomen without contrast to further characterize.  Patient would like this to be repaired, referring to general surgery.

## 2024-03-05 NOTE — Assessment & Plan Note (Signed)
 Continue all medications as prescribed.  Close follow-up on this.  Emphasized importance of home blood pressure monitoring

## 2024-03-05 NOTE — Assessment & Plan Note (Signed)
 Seems to have improved with use of Bactrim.  Follow-up with dermatology as planned 05/08/2024.  Continue moisturizing.

## 2024-03-05 NOTE — Progress Notes (Signed)
 Date:  03/05/2024   Name:  [name]   DOB:  16-Mar-1976   MRN:  [id number]   Chief Complaint: Medical Management of Chronic Issues (Pt last seen 02/21/24. Pt reports emesis this morning that started as nausea all day yesterday. [facility] for gallstones and was advised no order was available. ), Hypertension (Patient was to add valsartan 80 mg and continue other prescribed medications. Reports taking as prescribed with no side effects, tolerating well and no symptoms to report. Reports she has not monitored at home. ), and Rash (Patient was given sulfamethoxazole-trimethoprim (BACTRIM DS) 800-160 MG tablet. Pt reports rash still present but not as severe and she has completed abx. )  HPI [name] returns to clinic for 2wk follow-up mainly for rash, HTN, and umbilical hernia.  Last visit given her guarding and severe tenderness to palpation of the umbilical hernia, I was concerned for strangulation and recommended she proceed directly to the emergency room, which she did not do.  I called for the next day and again advised her to go, but she still did not go.  She states the area is still painful, about the same as before.  Started valsartan but has not been watching BP at home.   Regarding her rash, it seems to have improved with the use of Bactrim which was started last visit to cover for staphylococcal infection given the golden appearance of her crusting around the ears and the worsening skin findings of the bilateral hands and fingers.  Overall this problem is doing much better.  We have been monitoring her for pronounced hyponatremia and hypochloremia, due for repeat labs today including urine and serum osmolality.  She has been drinking 1 Gatorade every day since January.  Continues to report sobriety from illicit substances.  She tells me that she did have 8 mg suboxone yesterday, uses intermittently for cravings.  Would like to find another  provider to continue her Suboxone, but does not want to "do the whole outpatient thing". Would be willing to do UDS today.   Asks about podiatry referral. She never returned for follow up with [facility]. Advised she needs to return there, information given.    Medication list has been reviewed and updated.  Current Meds  Medication Sig   cloNIDine (CATAPRES) 0.1 MG tablet Take 1 tablet (0.1 mg total) by mouth as needed (for BP >170/110).   diphenhydrAMINE (BENADRYL) 25 MG tablet Take 25 mg by mouth daily as needed for allergies.   fluticasone (FLONASE) 50 MCG/ACT nasal spray Place 2 sprays into both nostrils daily.   ibuprofen (ADVIL) 600 MG tablet Take 1 tablet (600 mg total) by mouth every 8 (eight) hours as needed.   Omega-3 Fatty Acids (FISH OIL) 1200 MG CAPS Take 1,200 mg by mouth.   propranolol (INDERAL) 60 MG tablet Take 1 tablet (60 mg total) by mouth 2 (two) times daily.   triamcinolone cream (KENALOG) 0.1 % Apply 1 Application topically 2 (two) times daily.   valsartan (DIOVAN) 80 MG tablet Take 1 tablet (80 mg total) by mouth daily.     Review of Systems  Patient Active Problem List   Diagnosis Date Noted   Periumbilical hernia 03/05/2024   Hypochloremia 03/05/2024   Hepatic steatosis 01/29/2024   Cholelithiasis without cholecystitis 01/29/2024   Prediabetes 10/09/2023   Wound of ankle 03/30/2023   Wound drainage 12/20/2022   Onychomycosis 09/08/2022   Hyponatremia 12/29/2021  Sinusitis, chronic 08/04/2021   Hot flashes 04/22/2021   History of HPV infection 04/22/2021   Rash 03/24/2021   Stasis dermatitis of both legs 02/04/2021   Anemia 01/09/2021   Essential hypertension 01/16/2020   Smoking 01/16/2020   Personality disorder (HCC) 09/01/2016   History of cocaine dependence (HCC) 09/01/2016   History of alcohol abuse 07/09/2014   Palpitations - evaluated by Dr. Melburn Popper in the past 02/20/2014   Anxiety 04/02/2012   History of opioid abuse (HCC)  03/18/2012   History of cervical cancer 01/02/2012   Migraine 04/08/2011    Allergies  Allergen Reactions   Doxycycline Nausea And Vomiting    Vomiting even with food   Acetaminophen Other (See Comments)    Intolerance, hx of tylenol OD     There is no immunization history on file for this patient.  Past Surgical History:  Procedure Laterality Date   A*wisdom teeth ext     CERVICAL CONIZATION W/BX  11/30/2011   Procedure: CONIZATION CERVIX WITH BIOPSY;  Surgeon: Janine Limbo, MD;  Location: [facility];  Service: Gynecology;  Laterality: N/A;   CERVICAL CONIZATION W/BX  03/16/2012   Procedure: CONIZATION CERVIX WITH BIOPSY;  Surgeon: Kirkland Hun, MD;  Location: [facility];  Service: Gynecology;  Laterality: N/A;   CONIZATION CERVIX     x 3   DECORTICATION Right 01/14/2021   Procedure: DECORTICATION;  Surgeon: Loreli Slot, MD;  Location: [facility] OR;  Service: Thoracic;  Laterality: Right;   DILATION AND CURETTAGE OF UTERUS  11/30/2011   Procedure: DILATATION AND CURETTAGE;  Surgeon: Janine Limbo, MD;  Location: [facility];  Service: Gynecology;  Laterality: N/A;   svd     x 1   VIDEO ASSISTED THORACOSCOPY (VATS)/EMPYEMA Right 01/14/2021   Procedure: VIDEO ASSISTED THORACOSCOPY (VATS)/DRAIN EMPYEMA;  Surgeon: Loreli Slot, MD;  Location: [facility] OR;  Service: Thoracic;  Laterality: Right;    Social History   Tobacco Use   Smoking status: Every Day    Current packs/day: 0.25    Average packs/day: 0.8 packs/day for 33.2 years (26.3 ttl pk-yrs)    Types: Cigarettes    Start date: 1992   Smokeless tobacco: Never   Tobacco comments:    Has cut down to 4-5 cigarettes per day  Vaping Use   Vaping status: Never Used  Substance Use Topics   Alcohol use: Yes    Alcohol/week: 20.0 standard drinks of alcohol    Types: 20 Standard drinks or equivalent per week    Comment: drinks hard seltzer daily   Drug use: Not Currently    Types: Heroin, Cocaine    Comment: Abused rx  drugs - on methadone 08/2011, stopped methadone  2007    Family History  Problem Relation Age of Onset   Cervical cancer Mother    Breast cancer Mother    Cancer Mother 6       Breast & cervical    Thyroid disease Mother    Stroke Maternal Grandmother    COPD Maternal Grandfather        Emphysema   Hypertension Paternal Grandfather    Stroke Paternal Grandfather    Heart attack Paternal Grandfather    Cancer Father 14       prostate   Hyperlipidemia Father    Other Paternal Grandmother        "old age" mid 47s        03/05/2024    2:52 PM 02/21/2024    3:59 PM 01/10/2024  2:26 PM 10/12/2023    3:02 PM  GAD 7 : Generalized Anxiety Score  Nervous, Anxious, on Edge 0 1 1 2   Control/stop worrying 1 1 2 2   Worry too much - different things 1 1 3 3   Trouble relaxing 2 2 3 3   Restless 0 0 2 2  Easily annoyed or irritable 2 2 3 3   Afraid - awful might happen 0 0 1 0  Total GAD 7 Score 6 7 15 15   Anxiety Difficulty Not difficult at all Somewhat difficult Somewhat difficult Not difficult at all       03/05/2024    2:52 PM 02/21/2024    3:59 PM 01/10/2024    2:26 PM  Depression screen PHQ 2/9  Decreased Interest 1 1 0  Down, Depressed, Hopeless 1 0 0  PHQ - 2 Score 2 1 0  Altered sleeping 2    Tired, decreased energy 2    Change in appetite 2    Feeling bad or failure about yourself  0    Trouble concentrating 1    Moving slowly or fidgety/restless 0    Suicidal thoughts 0    PHQ-9 Score 9    Difficult doing work/chores Somewhat difficult      BP Readings from Last 3 Encounters:  03/05/24 (!) 170/90  02/21/24 (!) 144/92  01/10/24 (!) 180/90    Wt Readings from Last 3 Encounters:  03/05/24 136 lb 6.4 oz (61.9 kg)  02/21/24 142 lb (64.4 kg)  01/10/24 138 lb (62.6 kg)    BP (!) 170/90 (Cuff Size: Normal)   Pulse 75   Ht 5' (1.524 m)   Wt 136 lb 6.4 oz (61.9 kg)   LMP 04/23/2019 (Approximate)   SpO2 99%   BMI 26.64 kg/m   Physical Exam Vitals and nursing  note reviewed.  Constitutional:      Appearance: Normal appearance.  Cardiovascular:     Rate and Rhythm: Normal rate.  Pulmonary:     Effort: Pulmonary effort is normal.  Abdominal:     General: There is no distension.     Tenderness: There is abdominal tenderness.     Hernia: A hernia is present. Hernia is present in the umbilical area.     Comments: 2-3 cm acutely tender periumbilical hernia with subtle purple discoloration.  Severe "discomfort" out of proportion to exam likely psychosomatic. Today I was able to very gradually apply gentle pressure until the hernia was reduced, but it immediately recurred spontaneously.   Musculoskeletal:        General: Normal range of motion.  Skin:    General: Skin is warm and dry.     Findings: Rash present. Rash is macular.     Comments: Skin appears dry in several spots but especially bilateral hands, forearms, shins.  Right anterior forearm with pink macular rash especially distally; similar on left but not as prominent. Several fissures of the fingers. Scabs bilateral ear lobes.   Neurological:     Mental Status: She is alert and oriented to person, place, and time.     Gait: Gait is intact.  Psychiatric:        Mood and Affect: Mood and affect normal.     Recent Labs     Component Value Date/Time   NA 125 (L) 01/10/2024 1514   NA 143 07/21/2014 1343   K 4.3 01/10/2024 1514   K 3.7 07/21/2014 1343   CL 87 (L) 01/10/2024 1514   CO2 21 01/10/2024  1514   CO2 25 07/21/2014 1343   GLUCOSE 103 (H) 01/10/2024 1514   GLUCOSE 126 (H) 03/23/2023 1129   GLUCOSE 95 07/21/2014 1343   BUN 3 (L) 01/10/2024 1514   BUN 4.6 (L) 07/21/2014 1343   CREATININE 0.55 (L) 01/10/2024 1514   CREATININE 0.6 07/21/2014 1343   CALCIUM 9.2 01/10/2024 1514   CALCIUM 8.9 07/21/2014 1343   PROT 8.0 01/10/2024 1514   PROT 7.0 07/21/2014 1343   ALBUMIN 4.4 01/10/2024 1514   ALBUMIN 3.2 (L) 07/21/2014 1343   AST 82 (H) 01/10/2024 1514   AST 301 (HH)  07/21/2014 1343   ALT 20 01/10/2024 1514   ALT 103 (H) 07/21/2014 1343   ALKPHOS 84 01/10/2024 1514   ALKPHOS 123 07/21/2014 1343   BILITOT 0.3 01/10/2024 1514   BILITOT 0.72 07/21/2014 1343   GFRNONAA >60 03/23/2023 1129   GFRAA 142 01/26/2021 1223    Lab Results  Component Value Date   WBC 5.6 10/05/2023   HGB 12.1 10/05/2023   HCT 35.3 10/05/2023   MCV 94 10/05/2023   PLT 179 10/05/2023   Lab Results  Component Value Date   HGBA1C 5.8 (H) 10/05/2023   Lab Results  Component Value Date   CHOL 177 03/30/2023   HDL 79 03/30/2023   LDLCALC 83 03/30/2023   TRIG 84 03/30/2023   CHOLHDL 2.2 03/30/2023   Lab Results  Component Value Date   TSH 2.740 01/10/2024     Assessment and Plan:  Essential hypertension Assessment & Plan: Continue all medications as prescribed.  Close follow-up on this.  Emphasized importance of home blood pressure monitoring   Periumbilical hernia Assessment & Plan: Patient reassured I am no longer concerned with strangulation.  Will order CT abdomen without contrast to further characterize.  Patient would like this to be repaired, referring to general surgery.  Orders: -     CT ABDOMEN WO CONTRAST; Future -     Ambulatory referral to General Surgery  Hyponatremia Assessment & Plan: Check labs as below  Orders: -     Comprehensive metabolic panel -     Lipid panel -     Osmolality -     Osmolality, urine -     Sodium, urine, random  Hypochloremia Assessment & Plan: Check labs as below  Orders: -     Comprehensive metabolic panel -     Lipid panel -     Osmolality -     Osmolality, urine -     Sodium, urine, random  Rash Assessment & Plan: Seems to have improved with use of Bactrim.  Follow-up with dermatology as planned 05/08/2024.  Continue moisturizing.  Orders: -     Drug Screen, Urine  History of opioid abuse (HCC) Assessment & Plan: Complete UDS today if able.  Will try to find her a Suboxone  provider.  Orders: -     Drug Screen, Urine  History of cocaine dependence (HCC) Assessment & Plan: Complete UDS today if able.  Orders: -     Drug Screen, Urine     Return TBD.    Alvester Morin, PA-C, DMSc, Nutritionist [facility] [phone]

## 2024-03-05 NOTE — Assessment & Plan Note (Signed)
 Complete UDS today if able.

## 2024-03-12 ENCOUNTER — Ambulatory Visit
Admission: RE | Admit: 2024-03-12 | Discharge: 2024-03-12 | Disposition: A | Discharge status: Home / Self Care | Payer: MEDICAID | Source: Ambulatory Visit | Attending: Physician Assistant | Admitting: Physician Assistant

## 2024-03-12 DIAGNOSIS — K429 Umbilical hernia without obstruction or gangrene: Secondary | ICD-10-CM | POA: Diagnosis present

## 2024-03-21 ENCOUNTER — Ambulatory Visit: Payer: MEDICAID | Admitting: General Surgery

## 2024-03-28 ENCOUNTER — Ambulatory Visit: Payer: MEDICAID | Admitting: General Surgery

## 2024-03-29 ENCOUNTER — Ambulatory Visit (INDEPENDENT_AMBULATORY_CARE_PROVIDER_SITE_OTHER): Payer: MEDICAID | Admitting: Physician Assistant

## 2024-03-29 ENCOUNTER — Encounter: Payer: Self-pay | Admitting: Physician Assistant

## 2024-03-29 VITALS — BP 172/96 | HR 71 | Temp 98.1°F | Ht 60.0 in | Wt 138.0 lb

## 2024-03-29 DIAGNOSIS — Z87898 Personal history of other specified conditions: Secondary | ICD-10-CM

## 2024-03-29 DIAGNOSIS — L309 Dermatitis, unspecified: Secondary | ICD-10-CM | POA: Diagnosis not present

## 2024-03-29 DIAGNOSIS — I10 Essential (primary) hypertension: Secondary | ICD-10-CM | POA: Diagnosis not present

## 2024-03-29 DIAGNOSIS — E871 Hypo-osmolality and hyponatremia: Secondary | ICD-10-CM

## 2024-03-29 DIAGNOSIS — I160 Hypertensive urgency: Secondary | ICD-10-CM | POA: Diagnosis not present

## 2024-03-29 DIAGNOSIS — K429 Umbilical hernia without obstruction or gangrene: Secondary | ICD-10-CM

## 2024-03-29 DIAGNOSIS — Z8614 Personal history of Methicillin resistant Staphylococcus aureus infection: Secondary | ICD-10-CM | POA: Insufficient documentation

## 2024-03-29 MED ORDER — MUPIROCIN 2 % EX OINT: 1.0000 | TOPICAL_OINTMENT | Freq: Two times a day (BID) | CUTANEOUS | 1 refills | Status: DC

## 2024-03-29 MED ORDER — SULFAMETHOXAZOLE-TRIMETHOPRIM 800-160 MG PO TABS: ORAL_TABLET | ORAL | 0 refills | Status: AC

## 2024-03-29 MED ORDER — AMLODIPINE BESYLATE 10 MG PO TABS: 10.0000 mg | ORAL_TABLET | Freq: Every day | ORAL | 1 refills | Status: DC

## 2024-03-29 NOTE — Progress Notes (Signed)
 Date:  03/29/2024   Name:  [name]   DOB:  Jul 05, 1976   MRN:  [id number]   Chief Complaint: Hypertension and Rash (On arms still there getting a little better, uncomfortable)  HPI Panayiota returns for f/u on poorly controlled HTN and abnormal electrolytes including persistent hyponatremia and hypochloremia. She still has not completed the labs ordered last visit 03/05/24. She was not able to void at that visit either for UDS and urine osmolality.   Still not reliably checking BP at home, last checked a week ago, unsure of numbers. Seems to be particularly attached to clonidine and wants another refill. Says she has not taken it recently because she ran out quickly after our last visit.   The rash is problematic. She continues to have painful, pruritic rash of the left earlobe with desquamation despite using triple antibiotic ointment and alternating with hydrocortisone. Has appt with derm but not until 05/08/24.   She continues to endorse ongoing sobriety from all illicit substances.   Has surgical consult for umbilical hernia 04/02/24.    Medication list has been reviewed and updated.  Current Meds  Medication Sig   amLODipine (NORVASC) 10 MG tablet Take 1 tablet (10 mg total) by mouth daily.   diphenhydrAMINE (BENADRYL) 25 MG tablet Take 25 mg by mouth daily as needed for allergies.   fluticasone (FLONASE) 50 MCG/ACT nasal spray Place 2 sprays into both nostrils daily.   ibuprofen (ADVIL) 600 MG tablet Take 1 tablet (600 mg total) by mouth every 8 (eight) hours as needed.   mupirocin ointment (BACTROBAN) 2 % Apply 1 Application topically 2 (two) times daily. Replaces Neosporin for the ear   Omega-3 Fatty Acids (FISH OIL) 1200 MG CAPS Take 1,200 mg by mouth.   propranolol (INDERAL) 60 MG tablet Take 1 tablet (60 mg total) by mouth 2 (two) times daily.   triamcinolone cream (KENALOG) 0.1 % Apply 1 Application topically 2 (two) times daily.   valsartan (DIOVAN) 80 MG tablet Take 1  tablet (80 mg total) by mouth daily.   [DISCONTINUED] cloNIDine (CATAPRES) 0.1 MG tablet Take 1 tablet (0.1 mg total) by mouth as needed (for BP >170/110).     Review of Systems  Patient Active Problem List   Diagnosis Date Noted   Hypertensive urgency 03/29/2024   Dermatitis of external ear 03/29/2024   History of substance use disorder 03/29/2024   History of MRSA infection 03/29/2024   Periumbilical hernia 03/05/2024   Hypochloremia 03/05/2024   Hepatic steatosis 01/29/2024   Cholelithiasis without cholecystitis 01/29/2024   Prediabetes 10/09/2023   Wound of ankle 03/30/2023   Wound drainage 12/20/2022   Onychomycosis 09/08/2022   Hyponatremia 12/29/2021   Sinusitis, chronic 08/04/2021   Hot flashes 04/22/2021   History of HPV infection 04/22/2021   Rash 03/24/2021   Stasis dermatitis of both legs 02/04/2021   Anemia 01/09/2021   Essential hypertension 01/16/2020   Smoking 01/16/2020   Personality disorder (HCC) 09/01/2016   History of cocaine dependence (HCC) 09/01/2016   History of alcohol abuse 07/09/2014   Palpitations - evaluated by Dr. Melburn Popper in the past 02/20/2014   Anxiety 04/02/2012   History of opioid abuse (HCC) 03/18/2012   History of cervical cancer 01/02/2012   Migraine 04/08/2011    Allergies  Allergen Reactions   Doxycycline Nausea And Vomiting    Vomiting even with food   Acetaminophen Other (See Comments)    Intolerance, hx of tylenol OD     There is  no immunization history on file for this patient.  Past Surgical History:  Procedure Laterality Date   A*wisdom teeth ext     CERVICAL CONIZATION W/BX  11/30/2011   Procedure: CONIZATION CERVIX WITH BIOPSY;  Surgeon: Janine Limbo, MD;  Location: [facility];  Service: Gynecology;  Laterality: N/A;   CERVICAL CONIZATION W/BX  03/16/2012   Procedure: CONIZATION CERVIX WITH BIOPSY;  Surgeon: Kirkland Hun, MD;  Location: [facility];  Service: Gynecology;  Laterality: N/A;   CONIZATION CERVIX     x  3   DECORTICATION Right 01/14/2021   Procedure: DECORTICATION;  Surgeon: Loreli Slot, MD;  Location: [facility] OR;  Service: Thoracic;  Laterality: Right;   DILATION AND CURETTAGE OF UTERUS  11/30/2011   Procedure: DILATATION AND CURETTAGE;  Surgeon: Janine Limbo, MD;  Location: [facility];  Service: Gynecology;  Laterality: N/A;   svd     x 1   VIDEO ASSISTED THORACOSCOPY (VATS)/EMPYEMA Right 01/14/2021   Procedure: VIDEO ASSISTED THORACOSCOPY (VATS)/DRAIN EMPYEMA;  Surgeon: Loreli Slot, MD;  Location: [facility] OR;  Service: Thoracic;  Laterality: Right;    Social History   Tobacco Use   Smoking status: Every Day    Current packs/day: 0.25    Average packs/day: 0.8 packs/day for 33.3 years (26.3 ttl pk-yrs)    Types: Cigarettes    Start date: 1992   Smokeless tobacco: Never   Tobacco comments:    Has cut down to 4-5 cigarettes per day  Vaping Use   Vaping status: Never Used  Substance Use Topics   Alcohol use: Yes    Alcohol/week: 20.0 standard drinks of alcohol    Types: 20 Standard drinks or equivalent per week    Comment: drinks hard seltzer daily   Drug use: Not Currently    Types: Heroin, Cocaine    Comment: Abused rx drugs - on methadone 08/2011, stopped methadone  2007    Family History  Problem Relation Age of Onset   Cervical cancer Mother    Breast cancer Mother    Cancer Mother 50       Breast & cervical    Thyroid disease Mother    Stroke Maternal Grandmother    COPD Maternal Grandfather        Emphysema   Hypertension Paternal Grandfather    Stroke Paternal Grandfather    Heart attack Paternal Grandfather    Cancer Father 69       prostate   Hyperlipidemia Father    Other Paternal Grandmother        "old age" mid 2s        03/29/2024    3:34 PM 03/05/2024    2:52 PM 02/21/2024    3:59 PM 01/10/2024    2:26 PM  GAD 7 : Generalized Anxiety Score  Nervous, Anxious, on Edge 1 0 1 1  Control/stop worrying 0 1 1 2   Worry too much - different  things 1 1 1 3   Trouble relaxing 2 2 2 3   Restless 0 0 0 2  Easily annoyed or irritable 2 2 2 3   Afraid - awful might happen 0 0 0 1  Total GAD 7 Score 6 6 7 15   Anxiety Difficulty Somewhat difficult Not difficult at all Somewhat difficult Somewhat difficult       03/29/2024    3:34 PM 03/05/2024    2:52 PM 02/21/2024    3:59 PM  Depression screen PHQ 2/9  Decreased Interest  1 1  Down,  Depressed, Hopeless 0 1 0  PHQ - 2 Score 0 2 1  Altered sleeping 2 2   Tired, decreased energy 2 2   Change in appetite 2 2   Feeling bad or failure about yourself  0 0   Trouble concentrating 0 1   Moving slowly or fidgety/restless 0 0   Suicidal thoughts 0 0   PHQ-9 Score 6 9   Difficult doing work/chores Somewhat difficult Somewhat difficult     BP Readings from Last 3 Encounters:  03/29/24 (!) 172/96  03/05/24 (!) 170/90  02/21/24 (!) 144/92    Wt Readings from Last 3 Encounters:  03/29/24 138 lb (62.6 kg)  03/05/24 136 lb 6.4 oz (61.9 kg)  02/21/24 142 lb (64.4 kg)    BP (!) 172/96 (BP Location: Left Arm, Cuff Size: Normal)   Pulse 71   Temp 98.1 F (36.7 C)   Ht 5' (1.524 m)   Wt 138 lb (62.6 kg)   LMP 04/23/2019 (Approximate)   SpO2 97%   BMI 26.95 kg/m   Physical Exam Vitals and nursing note reviewed.  Constitutional:      Appearance: Normal appearance.  Cardiovascular:     Rate and Rhythm: Normal rate.  Pulmonary:     Effort: Pulmonary effort is normal.  Abdominal:     General: There is no distension.  Musculoskeletal:        General: Normal range of motion.  Skin:    General: Skin is warm and dry.     Comments: Left ear lobe is deeply erythematous with desquamation.   Neurological:     Mental Status: She is alert and oriented to person, place, and time.     Gait: Gait is intact.  Psychiatric:        Mood and Affect: Mood and affect normal.      Recent Labs     Component Value Date/Time   NA 125 (L) 01/10/2024 1514   NA 143 07/21/2014 1343   K 4.3  01/10/2024 1514   K 3.7 07/21/2014 1343   CL 87 (L) 01/10/2024 1514   CO2 21 01/10/2024 1514   CO2 25 07/21/2014 1343   GLUCOSE 103 (H) 01/10/2024 1514   GLUCOSE 126 (H) 03/23/2023 1129   GLUCOSE 95 07/21/2014 1343   BUN 3 (L) 01/10/2024 1514   BUN 4.6 (L) 07/21/2014 1343   CREATININE 0.55 (L) 01/10/2024 1514   CREATININE 0.6 07/21/2014 1343   CALCIUM 9.2 01/10/2024 1514   CALCIUM 8.9 07/21/2014 1343   PROT 8.0 01/10/2024 1514   PROT 7.0 07/21/2014 1343   ALBUMIN 4.4 01/10/2024 1514   ALBUMIN 3.2 (L) 07/21/2014 1343   AST 82 (H) 01/10/2024 1514   AST 301 (HH) 07/21/2014 1343   ALT 20 01/10/2024 1514   ALT 103 (H) 07/21/2014 1343   ALKPHOS 84 01/10/2024 1514   ALKPHOS 123 07/21/2014 1343   BILITOT 0.3 01/10/2024 1514   BILITOT 0.72 07/21/2014 1343   GFRNONAA >60 03/23/2023 1129   GFRAA 142 01/26/2021 1223    Lab Results  Component Value Date   WBC 5.6 10/05/2023   HGB 12.1 10/05/2023   HCT 35.3 10/05/2023   MCV 94 10/05/2023   PLT 179 10/05/2023   Lab Results  Component Value Date   HGBA1C 5.8 (H) 10/05/2023   Lab Results  Component Value Date   CHOL 177 03/30/2023   HDL 79 03/30/2023   LDLCALC 83 03/30/2023   TRIG 84 03/30/2023   CHOLHDL 2.2 03/30/2023  Lab Results  Component Value Date   TSH 2.740 01/10/2024     Assessment and Plan:  Essential hypertension Assessment & Plan: Start amlodipine. D/c clonidine. Continue propranolol and valsartan. Emphasized the importance of BP log for my review next time, at least 3 readings per week.   Orders: -     amLODIPine Besylate; Take 1 tablet (10 mg total) by mouth daily.  Dispense: 30 tablet; Refill: 1  Hypertensive urgency Assessment & Plan: Stop clonidine, as I feel it is causing unsafe fluctuations/rebound in BP. She seems displeased with this, similar to last visit when I mentioned stopping it.    Dermatitis of external ear Assessment & Plan: Begin mupirocin. Will treat with another round of Bactrim  BID x7d, followed by every day x7d. Follow up with dermatology.   Orders: -     Sulfamethoxazole-Trimethoprim; Take 1 tablet by mouth in the morning and at bedtime for 7 days, THEN 1 tablet daily for 7 days. Take with food.  Dispense: 21 tablet; Refill: 0 -     Mupirocin; Apply 1 Application topically 2 (two) times daily. Replaces Neosporin for the ear  Dispense: 22 g; Refill: 1  History of substance use disorder Assessment & Plan: Check UDS. If she cannot provide sample today, she will need to hold her urine before next visit to ensure sample collection.    Periumbilical hernia Assessment & Plan: Proceed with surgical consult 04/02/24. It will be particularly important to establish BP control prior to surgery, and also identify what is causing her electrolyte derangements.    Hyponatremia Assessment & Plan: Repeat CMP. Advised patient that even if she cannot urinate today, she should at least do the blood work. It is not ideal as we would like to have same-day collection for osmolality studies, but she is overdue for repeat labs in the context of significant electrolyte abnormalities.       Return in about 10 days (around 04/08/2024) for OV f/u HTN.    Alvester Morin, PA-C, DMSc, Nutritionist [facility] Primary Care and Sports Medicine [facility] [phone]

## 2024-03-29 NOTE — Assessment & Plan Note (Signed)
 Start amlodipine. D/c clonidine. Continue propranolol and valsartan. Emphasized the importance of BP log for my review next time, at least 3 readings per week.

## 2024-03-29 NOTE — Assessment & Plan Note (Addendum)
 Stop clonidine, as I feel it is causing unsafe fluctuations/rebound in BP. She seems displeased with this, similar to last visit when I mentioned stopping it.

## 2024-03-29 NOTE — Assessment & Plan Note (Signed)
 Begin mupirocin. Will treat with another round of Bactrim BID x7d, followed by every day x7d. Follow up with dermatology.

## 2024-03-29 NOTE — Assessment & Plan Note (Signed)
 Check UDS. If she cannot provide sample today, she will need to hold her urine before next visit to ensure sample collection.

## 2024-03-29 NOTE — Assessment & Plan Note (Signed)
 Proceed with surgical consult 04/02/24. It will be particularly important to establish BP control prior to surgery, and also identify what is causing her electrolyte derangements.

## 2024-03-29 NOTE — Assessment & Plan Note (Signed)
 Repeat CMP. Advised patient that even if she cannot urinate today, she should at least do the blood work. It is not ideal as we would like to have same-day collection for osmolality studies, but she is overdue for repeat labs in the context of significant electrolyte abnormalities.

## 2024-04-02 ENCOUNTER — Ambulatory Visit: Payer: MEDICAID | Admitting: General Surgery

## 2024-04-02 LAB — COMPREHENSIVE METABOLIC PANEL WITH GFR
ALT: 21 IU/L (ref 0–32)
AST: 82 IU/L — ABNORMAL HIGH (ref 0–40)
Albumin: 4.4 g/dL (ref 3.9–4.9)
Alkaline Phosphatase: 99 IU/L (ref 44–121)
BUN/Creatinine Ratio: 9 (ref 9–23)
BUN: 5 mg/dL — ABNORMAL LOW (ref 6–24)
Bilirubin Total: 0.6 mg/dL (ref 0.0–1.2)
CO2: 21 mmol/L (ref 20–29)
Calcium: 9.7 mg/dL (ref 8.7–10.2)
Chloride: 84 mmol/L — ABNORMAL LOW (ref 96–106)
Creatinine, Ser: 0.58 mg/dL (ref 0.57–1.00)
Globulin, Total: 3.7 g/dL (ref 1.5–4.5)
Glucose: 100 mg/dL — ABNORMAL HIGH (ref 70–99)
Potassium: 4.3 mmol/L (ref 3.5–5.2)
Sodium: 122 mmol/L — ABNORMAL LOW (ref 134–144)
Total Protein: 8.1 g/dL (ref 6.0–8.5)
eGFR: 111 mL/min/{1.73_m2} (ref >59)

## 2024-04-02 LAB — OSMOLALITY, URINE

## 2024-04-02 LAB — OSMOLALITY: Osmolality Meas: 301 mosm/kg — ABNORMAL HIGH (ref 275–295)

## 2024-04-02 LAB — SODIUM, URINE, RANDOM

## 2024-04-02 LAB — LIPID PANEL
Chol/HDL Ratio: 2.4 ratio (ref 0.0–4.4)
Cholesterol, Total: 191 mg/dL (ref 100–199)
HDL: 79 mg/dL (ref >39)
LDL Chol Calc (NIH): 97 mg/dL (ref 0–99)
Triglycerides: 83 mg/dL (ref 0–149)
VLDL Cholesterol Cal: 15 mg/dL (ref 5–40)

## 2024-04-04 ENCOUNTER — Ambulatory Visit: Payer: MEDICAID | Admitting: General Surgery

## 2024-04-08 ENCOUNTER — Telehealth: Payer: Self-pay | Admitting: Physician Assistant

## 2024-04-08 ENCOUNTER — Ambulatory Visit: Payer: MEDICAID | Admitting: Physician Assistant

## 2024-04-08 NOTE — Telephone Encounter (Signed)
-----   Message from Danelle Dunning sent at 04/05/2024  7:39 PM EDT ----- Regarding: Urine Patient needs to be reminded that we will need urine today

## 2024-04-08 NOTE — Telephone Encounter (Signed)
 Reminded patient via phone we will need to collect urine at the upcoming visit. She'll need to reschedule today's appointment to a future date as she did not realize the appointment was today and has not arranged transportation.

## 2024-04-09 ENCOUNTER — Ambulatory Visit: Payer: MEDICAID | Admitting: General Surgery

## 2024-04-10 ENCOUNTER — Encounter: Payer: Self-pay | Admitting: Physician Assistant

## 2024-04-10 ENCOUNTER — Ambulatory Visit: Payer: MEDICAID | Admitting: Physician Assistant

## 2024-04-11 ENCOUNTER — Ambulatory Visit: Payer: MEDICAID | Admitting: General Surgery

## 2024-04-15 ENCOUNTER — Other Ambulatory Visit: Payer: Self-pay | Admitting: Physician Assistant

## 2024-04-15 DIAGNOSIS — I10 Essential (primary) hypertension: Secondary | ICD-10-CM

## 2024-04-16 ENCOUNTER — Ambulatory Visit: Payer: MEDICAID | Admitting: General Surgery

## 2024-04-17 NOTE — Telephone Encounter (Signed)
 Requested Prescriptions  Pending Prescriptions Disp Refills   valsartan  (DIOVAN ) 80 MG tablet [Pharmacy Med Name: VALSARTAN  80MG  TABLETS] 90 tablet 0    Sig: TAKE 1 TABLET(80 MG) BY MOUTH DAILY     Cardiovascular:  Angiotensin Receptor Blockers Failed - 04/17/2024 10:25 AM      Failed - Last BP in normal range    BP Readings from Last 1 Encounters:  03/29/24 (!) 172/96         Passed - Cr in normal range and within 180 days    Creatinine  Date Value Ref Range Status  07/21/2014 0.6 0.6 - 1.1 mg/dL Final   Creatinine, Ser  Date Value Ref Range Status  03/29/2024 0.58 0.57 - 1.00 mg/dL Final         Passed - K in normal range and within 180 days    Potassium  Date Value Ref Range Status  03/29/2024 4.3 3.5 - 5.2 mmol/L Final  07/21/2014 3.7 3.5 - 5.1 mEq/L Final         Passed - Patient is not pregnant      Passed - Valid encounter within last 6 months    Recent Outpatient Visits           2 weeks ago Essential hypertension   [facility], Arleen Lacer, PA   1 month ago Essential hypertension   [facility] at [facility], Arleen Lacer, [place]   1 month ago Umbilical hernia without obstruction and without gangrene   [facility] at [facility], Arleen Lacer, [place]       Future Appointments             In 3 weeks Sandridge, Brenda K, PA-C [facility]

## 2024-04-23 ENCOUNTER — Encounter: Payer: Self-pay | Admitting: General Surgery

## 2024-04-23 ENCOUNTER — Ambulatory Visit: Payer: Self-pay | Admitting: General Surgery

## 2024-04-23 ENCOUNTER — Ambulatory Visit (INDEPENDENT_AMBULATORY_CARE_PROVIDER_SITE_OTHER): Payer: MEDICAID | Admitting: General Surgery

## 2024-04-23 ENCOUNTER — Telehealth: Payer: Self-pay | Admitting: General Surgery

## 2024-04-23 VITALS — BP 129/85 | HR 80 | Temp 97.7°F | Ht 60.0 in | Wt 144.6 lb

## 2024-04-23 DIAGNOSIS — K429 Umbilical hernia without obstruction or gangrene: Secondary | ICD-10-CM

## 2024-04-23 NOTE — Telephone Encounter (Signed)
 Patient has been advised of Pre-Admission date/time, and Surgery date at [facility].  Surgery Date: 04/26/24 Preadmission Testing Date: Preadmissions to call patient for phone interview.    Patient has been informed of the scheduling process for surgery and information for surgery is given to her at time of office visit.   Patient has been made aware to call [phone], between 1-3:00pm the day before surgery, to find out what time to arrive for surgery.

## 2024-04-23 NOTE — Patient Instructions (Signed)
 You have requested for your Umbilical Hernia be repaired. This will be scheduled with Dr. Cornel Diesel at [facility].   If you are on any injectable weight loss medication, you will need to stop taking your GLP-1 injectable (weight loss) medications 8 days before your surgery to avoid any complications with anesthesia.   Please see your (blue)pre-care sheet for information. Our surgery scheduler will call you to verify surgery date and to go over information.   You will need to arrange to be off work for 1-2 weeks but will have to have a lifting restriction of no more than 15 lbs for 6 weeks following your surgery. If you have FMLA or disability paperwork that needs filled out you may drop this off at our office or this can be faxed to [phone].     Umbilical Hernia, Adult A hernia is a bulge of tissue that pushes through an opening between muscles. An umbilical hernia happens in the abdomen, near the belly button (umbilicus). The hernia may contain tissues from the small intestine, large intestine, or fatty tissue covering the intestines (omentum). Umbilical hernias in adults tend to get worse over time, and they require surgical treatment. There are several types of umbilical hernias. You may have: A hernia located just above or below the umbilicus (indirect hernia). This is the most common type of umbilical hernia in adults. A hernia that forms through an opening formed by the umbilicus (direct hernia). A hernia that comes and goes (reducible hernia). A reducible hernia may be visible only when you strain, lift something heavy, or cough. This type of hernia can be pushed back into the abdomen (reduced). A hernia that traps abdominal tissue inside the hernia (incarcerated hernia). This type of hernia cannot be reduced. A hernia that cuts off blood flow to the tissues inside the hernia (strangulated hernia). The tissues can start to die if this happens. This type of hernia  requires emergency treatment.  What are the causes? An umbilical hernia happens when tissue inside the abdomen presses on a weak area of the abdominal muscles. What increases the risk? You may have a greater risk of this condition if you: Are obese. Have had several pregnancies. Have a buildup of fluid inside your abdomen (ascites). Have had surgery that weakens the abdominal muscles.  What are the signs or symptoms? The main symptom of this condition is a painless bulge at or near the belly button. A reducible hernia may be visible only when you strain, lift something heavy, or cough. Other symptoms may include: Dull pain. A feeling of pressure.  Symptoms of a strangulated hernia may include: Pain that gets increasingly worse. Nausea and vomiting. Pain when pressing on the hernia. Skin over the hernia becoming red or purple. Constipation. Blood in the stool.  How is this diagnosed? This condition may be diagnosed based on: A physical exam. You may be asked to cough or strain while standing. These actions increase the pressure inside your abdomen and force the hernia through the opening in your muscles. Your health care provider may try to reduce the hernia by pressing on it. Your symptoms and medical history.  How is this treated? Surgery is the only treatment for an umbilical hernia. Surgery for a strangulated hernia is done as soon as possible. If you have a small hernia that is not incarcerated, you may need to lose weight before having surgery. Follow these instructions at home: Lose weight, if told by your health care provider.  Do not try to push the hernia back in. Watch your hernia for any changes in color or size. Tell your health care provider if any changes occur. You may need to avoid activities that increase pressure on your hernia. Do not lift anything that is heavier than 10 lb (4.5 kg) until your health care provider says that this is safe. Take over-the-counter  and prescription medicines only as told by your health care provider. Keep all follow-up visits as told by your health care provider. This is important. Contact a health care provider if: Your hernia gets larger. Your hernia becomes painful. Get help right away if: You develop sudden, severe pain near the area of your hernia. You have pain as well as nausea or vomiting. You have pain and the skin over your hernia changes color. You develop a fever. This information is not intended to replace advice given to you by your health care provider. Make sure you discuss any questions you have with your health care provider. Document Released: 05/06/2016 Document Revised: 08/07/2016 Document Reviewed: 05/06/2016 Elsevier Interactive Patient Education  [employer].

## 2024-04-25 ENCOUNTER — Encounter
Admission: RE | Admit: 2024-04-25 | Discharge: 2024-04-25 | Disposition: A | Discharge status: Home / Self Care | Payer: MEDICAID | Source: Ambulatory Visit | Attending: General Surgery | Admitting: General Surgery

## 2024-04-25 ENCOUNTER — Other Ambulatory Visit: Payer: Self-pay

## 2024-04-25 VITALS — Ht 60.0 in | Wt 140.0 lb

## 2024-04-25 DIAGNOSIS — Z01812 Encounter for preprocedural laboratory examination: Secondary | ICD-10-CM

## 2024-04-25 DIAGNOSIS — I10 Essential (primary) hypertension: Secondary | ICD-10-CM

## 2024-04-25 DIAGNOSIS — Z01810 Encounter for preprocedural cardiovascular examination: Secondary | ICD-10-CM

## 2024-04-25 HISTORY — DX: Fatty (change of) liver, not elsewhere classified: K76.0

## 2024-04-25 HISTORY — DX: Cocaine use, unspecified, in remission: F14.91

## 2024-04-25 MED ORDER — CHLORHEXIDINE GLUCONATE CLOTH 2 % EX PADS: 6.0000 | MEDICATED_PAD | Freq: Once | CUTANEOUS | Status: DC

## 2024-04-25 MED ORDER — CHLORHEXIDINE GLUCONATE 0.12 % MT SOLN: 15.0000 mL | Freq: Once | OROMUCOSAL | Status: DC

## 2024-04-25 MED ORDER — ORAL CARE MOUTH RINSE: 15.0000 mL | Freq: Once | OROMUCOSAL | Status: DC

## 2024-04-25 MED ORDER — CEFAZOLIN SODIUM-DEXTROSE 2-4 GM/100ML-% IV SOLN: 2.0000 g | INTRAVENOUS | Status: DC

## 2024-04-25 MED ORDER — LACTATED RINGERS IV SOLN
INTRAVENOUS | Status: DC
Start: 2024-04-25 — End: 2024-04-26

## 2024-04-25 NOTE — Patient Instructions (Addendum)
 Your procedure is scheduled on: Friday, May 9 Report to the Registration Desk on the 1st floor of the [facility] To find out your arrival time, please call [phone] between 1PM - 3PM on: Thursday, May 8 If your arrival time is 6:00 am, do not arrive before that time as the [facility] entrance doors do not open until 6:00 am.  REMEMBER: Instructions that are not followed completely may result in serious medical risk, up to and including death; or upon the discretion of your surgeon and anesthesiologist your surgery may need to be rescheduled.  Do not eat or drink after midnight the night before surgery.  No gum chewing or hard candies.  One week prior to surgery:  Stop Anti-inflammatories (NSAIDS) such as Advil , Aleve , Ibuprofen , Motrin , Naproxen , Naprosyn  and Aspirin based products such as Excedrin, Goody's Powder, BC Powder. Stop ANY OVER THE COUNTER supplements until after surgery. Stop vitamins and fish oil.  You may however, continue to take Tylenol  if needed for pain up until the day of surgery.  Continue taking all of your other prescription medications up until the day of surgery.  ON THE DAY OF SURGERY ONLY TAKE THESE MEDICATIONS WITH SIPS OF WATER :  Amlodipine  Propranolol   No Alcohol for 24 hours before or after surgery.  No Smoking including e-cigarettes for 24 hours before surgery.  No chewable tobacco products for at least 6 hours before surgery.  No nicotine  patches on the day of surgery.  Do not use any "recreational" drugs for at least a week (preferably 2 weeks) before your surgery.  Please be advised that the combination of cocaine and anesthesia may have negative outcomes, up to and including death. If you test positive for cocaine, your surgery will be cancelled.  On the morning of surgery brush your teeth with toothpaste and water , you may rinse your mouth with mouthwash if you wish. Do not swallow any toothpaste or mouthwash.  Use [facility] Soap as  directed on instruction sheet. Since you are unable to come and pick up this soap, shower using antibacterial soap before coming to the hospital on the day of surgery tomorrow.  Do not wear jewelry, make-up, hairpins, clips or nail polish.  For welded (permanent) jewelry: bracelets, anklets, waist bands, etc.  Please have this removed prior to surgery.  If it is not removed, there is a chance that hospital personnel will need to cut it off on the day of surgery.  Do not wear lotions, powders, or perfumes.   Do not shave body hair from the neck down 48 hours before surgery.  Contact lenses, hearing aids and dentures may not be worn into surgery.  Do not bring valuables to the hospital. [facility] is not responsible for any missing/lost belongings or valuables.   Notify your doctor if there is any change in your medical condition (cold, fever, infection).  Wear comfortable clothing (specific to your surgery type) to the hospital.  After surgery, you can help prevent lung complications by doing breathing exercises.  Take deep breaths and cough every 1-2 hours. Your doctor may order a device called an Incentive Spirometer to help you take deep breaths. When coughing or sneezing, hold a pillow firmly against your incision with both hands. This is called "splinting." Doing this helps protect your incision. It also decreases belly discomfort.  If you are being discharged the day of surgery, you will not be allowed to drive home. You will need a responsible individual to drive you home and  stay with you for 24 hours after surgery.   If you are taking public transportation, you will need to have a responsible individual with you.  Please call the Pre-admissions Testing Dept. at [phone] if you have any questions about these instructions.  Surgery Visitation Policy:  Patients having surgery or a procedure may have two visitors.  Children under the age of 69 must have an adult with them  who is not the patient.

## 2024-04-25 NOTE — Progress Notes (Signed)
 Patient ID: [name], female   DOB: 07/01/1976, 48 y.o.   MRN: [id number] CC: Umbilical Hernia History of Present Illness [name] is a 48 y.o. female with past medical history as below including history of drug abuse who presents in consultation for umbilical hernia.  The patient reports that she has had this hernia for some time.  She does get pain with it.  She does notice a bulge.  She denies any overlying skin changes she denies any obstructive symptoms including nausea vomiting or obstipation.  She reports never having it repaired before.  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   Alcohol abuse    Allergy    Anxiety    Bacterial infection    Cervical intraepithelial neoplasia III    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 (HCC) 12/2020   CIN III (cervical intraepithelial neoplasia grade III) with severe dysplasia 12/19/2005   Condyloma 12/19/2009   H/O fatigue 12/20/2007   H/O varicella    Headache(784.0)    Hepatic steatosis    History of cocaine use    HPV (human papilloma virus) anogenital infection    Hx of dizziness 09/20/2011   Hx: UTI (urinary tract infection)    Back pain   Hypertension    Irregular menstrual cycle    IV drug user    MRSA (methicillin resistant Staphylococcus aureus) colonization 01/13/2021   PCR   Palpitations 12/19/2008   Pelvic pain in female 12/05/2011   Pneumonia due to COVID-19 virus 12/2020   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    Age 70   Trichomonas    Yeast infection        Past Surgical History:  Procedure Laterality Date   CERVICAL CONIZATION W/BX  11/30/2011   Procedure: CONIZATION CERVIX WITH BIOPSY;  Surgeon: Anselmo Kings, MD;  Location: [facility];  Service: Gynecology;  Laterality: N/A;   CERVICAL CONIZATION W/BX  03/16/2012   Procedure: CONIZATION CERVIX WITH BIOPSY;  Surgeon: Lula Sale, MD;   Location: [facility];  Service: Gynecology;  Laterality: N/A;   CONIZATION CERVIX     x 3   DECORTICATION Right 01/14/2021   Procedure: DECORTICATION;  Surgeon: Zelphia Higashi, MD;  Location: [facility] OR;  Service: Thoracic;  Laterality: Right;   DILATION AND CURETTAGE OF UTERUS  11/30/2011   Procedure: DILATATION AND CURETTAGE;  Surgeon: Anselmo Kings, MD;  Location: [facility];  Service: Gynecology;  Laterality: N/A;   VIDEO ASSISTED THORACOSCOPY (VATS)/EMPYEMA Right 01/14/2021   Procedure: VIDEO ASSISTED THORACOSCOPY (VATS)/DRAIN EMPYEMA;  Surgeon: Zelphia Higashi, MD;  Location: [facility] OR;  Service: Thoracic;  Laterality: Right;   WISDOM TOOTH EXTRACTION      Allergies  Allergen Reactions   Doxycycline  Nausea And Vomiting    Vomiting even with food   Acetaminophen  Other (See Comments)    Intolerance, hx of tylenol  OD    Current Outpatient Medications  Medication Sig Dispense Refill   amLODipine  (NORVASC ) 10 MG tablet Take 1 tablet (10 mg total) by mouth daily. 30 tablet 1   diphenhydrAMINE  (BENADRYL ) 25 MG tablet Take 25 mg by mouth daily as needed for allergies.     Multiple Vitamins-Minerals (HAIR SKIN & NAILS ADVANCED PO) Take 1 tablet by mouth daily.     mupirocin  ointment (BACTROBAN ) 2 % Apply 1 Application topically 2 (two) times daily. Replaces Neosporin for  the ear (Patient taking differently: Apply 1 Application topically 2 (two) times daily as needed (Dermatitis). Replaces Neosporin for the ear) 22 g 1   Omega-3 Fatty Acids (FISH OIL) 1200 MG CAPS Take 1,200 mg by mouth as needed.     propranolol  (INDERAL ) 60 MG tablet Take 1 tablet (60 mg total) by mouth 2 (two) times daily. 180 tablet 1   valsartan  (DIOVAN ) 80 MG tablet TAKE 1 TABLET(80 MG) BY MOUTH DAILY 90 tablet 0   No current facility-administered medications for this visit.    Family History Family History  Problem Relation Age of Onset   Cervical cancer Mother    Breast cancer Mother    Cancer Mother 4        Breast & cervical    Thyroid disease Mother    Stroke Maternal Grandmother    COPD Maternal Grandfather        Emphysema   Hypertension Paternal Grandfather    Stroke Paternal Grandfather    Heart attack Paternal Grandfather    Cancer Father 51       prostate   Hyperlipidemia Father    Other Paternal Grandmother        "old age" mid 36s       Social History Social History   Tobacco Use   Smoking status: Every Day    Current packs/day: 0.25    Average packs/day: 0.8 packs/day for 33.3 years (26.3 ttl pk-yrs)    Types: Cigarettes    Start date: 1992   Smokeless tobacco: Never   Tobacco comments:    Has cut down to 4-5 cigarettes per day  Vaping Use   Vaping status: Never Used  Substance Use Topics   Alcohol use: Yes    Alcohol/week: 20.0 standard drinks of alcohol    Types: 20 Standard drinks or equivalent per week    Comment: drinks hard seltzer daily   Drug use: Not Currently    Types: Heroin, Cocaine    Comment: Abused rx drugs - on methadone 08/2011, stopped methadone  2007        ROS Full ROS of systems performed and is otherwise negative there than what is stated in the HPI  Physical Exam Blood pressure 129/85, pulse 80, temperature 97.7 F (36.5 C), temperature source Oral, height 5' (1.524 m), weight 144 lb 9.6 oz (65.6 kg), last menstrual period 04/23/2019, SpO2 100%.  Anxious appearing woman who is tearful on exam, normal work of breathing on room air, regular rate and rhythm, clear to auscultation bilaterally, abdomen is soft, nontender throughout the quadrants however she does have some tenderness in her umbilicus where there is an obvious hernia.  This hernia is partially reducible and the defect measures to be about 1-1/2 cm.  There are no overlying skin changes.  Data Reviewed I reviewed her CT scan and there is a fat-containing hernia and on the CT scan it measures approximately 1.6 cm.  I have personally reviewed the patient's imaging and medical  records.    Assessment    Patient with umbilical hernia.  She is still smoking so I recommended that we do an open repair given she has been having pain from this.  We discussed the risk, benefits alternatives of the procedure including risk of infection, bleeding damage to underlying viscera as well as recurrence.  Depending on the size I do not think we will need to use mesh since this is the primary hernia and the size seems to be quite small.  However, I discussed with her that if it is larger than anticipated I would use a piece of mesh to reduce the risk of recurrence.  I discussed this with the patient and she wishes to proceed   A total of 45 minutes was spent reviewing the patient's chart, performing history and physical and formulating a treatment plan with the patient.   Barrett Lick 04/25/2024, 12:38 PM

## 2024-04-26 ENCOUNTER — Encounter: Payer: Self-pay | Admitting: Certified Registered"

## 2024-04-26 ENCOUNTER — Encounter: Admission: RE | Payer: Self-pay | Source: Home / Self Care

## 2024-04-26 ENCOUNTER — Telehealth: Payer: Self-pay | Admitting: General Surgery

## 2024-04-26 ENCOUNTER — Ambulatory Visit: Admission: RE | Admit: 2024-04-26 | Payer: MEDICAID | Source: Home / Self Care | Admitting: General Surgery

## 2024-04-26 DIAGNOSIS — K429 Umbilical hernia without obstruction or gangrene: Secondary | ICD-10-CM

## 2024-04-26 SURGERY — REPAIR, HERNIA, UMBILICAL, ADULT
Anesthesia: General

## 2024-04-26 NOTE — Telephone Encounter (Signed)
 Updated information regarding rescheduled surgery.  Patient called the OR day of surgery to cancel.  Patient has been strongly encouraged to keep her rescheduled surgery date.  She promised to be there.    Patient has been advised of Pre-Admission date/time, and Surgery date at [facility].  Surgery Date: 05/01/24 Preadmission Testing Date: Already done 04/25/24)  Patient informed of rescheduled date for surgery and has number to call the day before to get arrival time for surgery.    Patient has been made aware to call [phone], between 1-3:00pm the day before surgery, to find out what time to arrive for surgery.

## 2024-04-26 NOTE — Anesthesia Preprocedure Evaluation (Signed)
 Anesthesia Evaluation  Patient identified by MRN, date of birth, ID band Patient awake    Reviewed: Allergy & Precautions, H&P , NPO status , Patient's Chart, lab work & pertinent test results, reviewed documented beta blocker date and time   Airway Mallampati: II  TM Distance: >3 FB Neck ROM: full    Dental  (+) Teeth Intact   Pulmonary pneumonia, resolved, Current Smoker   Pulmonary exam normal        Cardiovascular Exercise Tolerance: Good hypertension, On Medications negative cardio ROS Normal cardiovascular exam Rhythm:regular Rate:Normal     Neuro/Psych  Headaches PSYCHIATRIC DISORDERS Anxiety        GI/Hepatic negative GI ROS, Neg liver ROS,,,  Endo/Other  negative endocrine ROS    Renal/GU negative Renal ROS  negative genitourinary   Musculoskeletal   Abdominal   Peds  Hematology  (+) Blood dyscrasia, anemia   Anesthesia Other Findings Past Medical History: No date: Abnormal Pap smear     Comment:  Age 48 11/30/2011: Adenocarcinoma in situ (AIS) of uterine cervix No date: Alcohol abuse No date: Allergy No date: Anxiety No date: Bacterial infection No date: Cervical intraepithelial neoplasia III 12/2020: 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  (HCC) 12/19/2005: CIN III (cervical intraepithelial neoplasia grade III)  with severe dysplasia 12/19/2009: Condyloma 12/20/2007: H/O fatigue No date: H/O varicella No date: Headache(784.0) No date: Hepatic steatosis No date: History of cocaine use No date: HPV (human papilloma virus) anogenital infection 09/20/2011: Hx of dizziness No date: Hx: UTI (urinary tract infection)     Comment:  Back pain No date: Hypertension No date: Irregular menstrual cycle No date: IV drug user 01/13/2021: MRSA (methicillin resistant Staphylococcus aureus)  colonization     Comment:  PCR 12/19/2008: Palpitations 12/05/2011: Pelvic pain in  female 12/2020: Pneumonia due to COVID-19 virus No date: 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     Comment:  Age 24 No date: Trichomonas No date: Yeast infection Past Surgical History: 11/30/2011: CERVICAL CONIZATION W/BX     Comment:  Procedure: CONIZATION CERVIX WITH BIOPSY;  Surgeon:               Anselmo Kings, MD;  Location: [facility];  Service:               Gynecology;  Laterality: N/A; 03/16/2012: CERVICAL CONIZATION W/BX     Comment:  Procedure: CONIZATION CERVIX WITH BIOPSY;  Surgeon:               Lula Sale, MD;  Location: [facility];  Service:               Gynecology;  Laterality: N/A; No date: CONIZATION CERVIX     Comment:  x 3 01/14/2021: DECORTICATION; Right     Comment:  Procedure: DECORTICATION;  Surgeon: Zelphia Higashi, MD;  Location: [facility] OR;  Service: Thoracic;  Laterality:              Right; 11/30/2011: DILATION AND CURETTAGE OF UTERUS     Comment:  Procedure: DILATATION AND CURETTAGE;  Surgeon: Anselmo Kings, MD;  Location: [facility];  Service: Gynecology;                Laterality: N/A; 01/14/2021: VIDEO ASSISTED THORACOSCOPY (VATS)/EMPYEMA; Right     Comment:  Procedure: VIDEO ASSISTED THORACOSCOPY (VATS)/DRAIN  EMPYEMA;  Surgeon: Zelphia Higashi, MD;  Location:               [facility] OR;  Service: Thoracic;  Laterality: Right; No date: WISDOM TOOTH EXTRACTION   Reproductive/Obstetrics negative OB ROS                             Anesthesia Physical Anesthesia Plan  ASA: 2  Anesthesia Plan: General ETT   Post-op Pain Management:    Induction:   PONV Risk Score and Plan: 3  Airway Management Planned:   Additional Equipment:   Intra-op Plan:   Post-operative Plan:   Informed Consent: I have reviewed the patients History and Physical, chart, labs and discussed the procedure including the risks, benefits and alternatives for the proposed anesthesia with the patient or authorized  representative who has indicated his/her understanding and acceptance.     Dental Advisory Given  Plan Discussed with: CRNA  Anesthesia Plan Comments:        Anesthesia Quick Evaluation

## 2024-05-01 ENCOUNTER — Encounter: Payer: Self-pay | Admitting: Anesthesiology

## 2024-05-01 ENCOUNTER — Ambulatory Visit
Admission: RE | Admit: 2024-05-01 | Discharge: 2024-05-01 | Disposition: A | Discharge status: Home / Self Care | Payer: MEDICAID | Attending: General Surgery | Admitting: General Surgery

## 2024-05-01 ENCOUNTER — Other Ambulatory Visit: Payer: Self-pay

## 2024-05-01 ENCOUNTER — Encounter: Payer: Self-pay | Admitting: General Surgery

## 2024-05-01 ENCOUNTER — Encounter
Admission: RE | Disposition: A | Discharge status: Home / Self Care | Payer: Self-pay | Source: Home / Self Care | Attending: General Surgery

## 2024-05-01 ENCOUNTER — Telehealth: Payer: Self-pay | Admitting: General Surgery

## 2024-05-01 DIAGNOSIS — I10 Essential (primary) hypertension: Secondary | ICD-10-CM

## 2024-05-01 DIAGNOSIS — K429 Umbilical hernia without obstruction or gangrene: Secondary | ICD-10-CM | POA: Insufficient documentation

## 2024-05-01 DIAGNOSIS — Z539 Procedure and treatment not carried out, unspecified reason: Secondary | ICD-10-CM | POA: Insufficient documentation

## 2024-05-01 DIAGNOSIS — F1090 Alcohol use, unspecified, uncomplicated: Secondary | ICD-10-CM | POA: Diagnosis not present

## 2024-05-01 DIAGNOSIS — Z01812 Encounter for preprocedural laboratory examination: Secondary | ICD-10-CM

## 2024-05-01 DIAGNOSIS — Z01810 Encounter for preprocedural cardiovascular examination: Secondary | ICD-10-CM

## 2024-05-01 LAB — CBC
HCT: 30.7 % — ABNORMAL LOW (ref 36.0–46.0)
Hemoglobin: 11 g/dL — ABNORMAL LOW (ref 12.0–15.0)
MCH: 33 pg (ref 26.0–34.0)
MCHC: 35.8 g/dL (ref 30.0–36.0)
MCV: 92.2 fL (ref 80.0–100.0)
Platelets: 236 10*3/uL (ref 150–400)
RBC: 3.33 MIL/uL — ABNORMAL LOW (ref 3.87–5.11)
RDW: 13.2 % (ref 11.5–15.5)
WBC: 8 10*3/uL (ref 4.0–10.5)
nRBC: 0 % (ref 0.0–0.2)

## 2024-05-01 LAB — ETHANOL: Alcohol, Ethyl (B): 279 mg/dL — ABNORMAL HIGH (ref <15)

## 2024-05-01 LAB — BASIC METABOLIC PANEL WITH GFR
Anion gap: 11 (ref 5–15)
BUN: 7 mg/dL (ref 6–20)
CO2: 23 mmol/L (ref 22–32)
Calcium: 9 mg/dL (ref 8.9–10.3)
Chloride: 89 mmol/L — ABNORMAL LOW (ref 98–111)
Creatinine, Ser: 0.47 mg/dL (ref 0.44–1.00)
GFR, Estimated: >60 mL/min (ref >60)
Glucose, Bld: 112 mg/dL — ABNORMAL HIGH (ref 70–99)
Potassium: 3.8 mmol/L (ref 3.5–5.1)
Sodium: 123 mmol/L — ABNORMAL LOW (ref 135–145)

## 2024-05-01 SURGERY — REPAIR, HERNIA, UMBILICAL, ADULT
Anesthesia: General

## 2024-05-01 MED ORDER — CHLORHEXIDINE GLUCONATE 0.12 % MT SOLN
OROMUCOSAL | Status: AC
  Filled 2024-05-01: qty 15

## 2024-05-01 MED ORDER — PHENYLEPHRINE HCL-NACL 20-0.9 MG/250ML-% IV SOLN
INTRAVENOUS | Status: AC
  Filled 2024-05-01: qty 250

## 2024-05-01 MED ORDER — ORAL CARE MOUTH RINSE: 15.0000 mL | Freq: Once | OROMUCOSAL | Status: AC

## 2024-05-01 MED ORDER — PROPOFOL 10 MG/ML IV BOLUS
INTRAVENOUS | Status: AC
  Filled 2024-05-01: qty 20

## 2024-05-01 MED ORDER — FENTANYL CITRATE (PF) 100 MCG/2ML IJ SOLN
INTRAMUSCULAR | Status: AC
  Filled 2024-05-01: qty 2

## 2024-05-01 MED ORDER — LACTATED RINGERS IV SOLN: INTRAVENOUS | Status: DC

## 2024-05-01 MED ORDER — CHLORHEXIDINE GLUCONATE 0.12 % MT SOLN
15.0000 mL | Freq: Once | OROMUCOSAL | Status: AC
  Administered 2024-05-01: 15 mL via OROMUCOSAL

## 2024-05-01 MED ORDER — CEFAZOLIN SODIUM-DEXTROSE 2-4 GM/100ML-% IV SOLN
INTRAVENOUS | Status: AC
  Filled 2024-05-01: qty 100

## 2024-05-01 MED ORDER — MIDAZOLAM HCL 2 MG/2ML IJ SOLN
INTRAMUSCULAR | Status: AC
  Filled 2024-05-01: qty 2

## 2024-05-01 NOTE — Anesthesia Preprocedure Evaluation (Addendum)
 Anesthesia Evaluation  Patient identified by MRN, date of birth, ID band Patient awake    Reviewed: Allergy & Precautions, NPO status , Patient's Chart, lab work & pertinent test results  History of Anesthesia Complications Negative for: history of anesthetic complications  Airway Mallampati: II  TM Distance: >3 FB Neck ROM: full    Dental no notable dental hx.    Pulmonary Current Smoker and Patient abstained from smoking.   Pulmonary exam normal        Cardiovascular hypertension, On Medications Normal cardiovascular exam     Neuro/Psych  Headaches PSYCHIATRIC DISORDERS Anxiety        GI/Hepatic negative GI ROS, Neg liver ROS,,,  Endo/Other  negative endocrine ROS    Renal/GU      Musculoskeletal   Abdominal   Peds  Hematology  (+) Blood dyscrasia, anemia   Anesthesia Other Findings Past Medical History: No date: Abnormal Pap smear     Comment:  Age 48 11/30/2011: Adenocarcinoma in situ (AIS) of uterine cervix No date: Alcohol abuse No date: Allergy No date: Anxiety No date: Bacterial infection No date: Cervical intraepithelial neoplasia III 12/2020: 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  (HCC) 12/19/2005: CIN III (cervical intraepithelial neoplasia grade III)  with severe dysplasia 12/19/2009: Condyloma 12/20/2007: H/O fatigue No date: H/O varicella No date: Headache(784.0) No date: Hepatic steatosis No date: History of cocaine use No date: HPV (human papilloma virus) anogenital infection 09/20/2011: Hx of dizziness No date: Hx: UTI (urinary tract infection)     Comment:  Back pain No date: Hypertension No date: Irregular menstrual cycle No date: IV drug user 01/13/2021: MRSA (methicillin resistant Staphylococcus aureus)  colonization     Comment:  PCR 12/19/2008: Palpitations 12/05/2011: Pelvic pain in female 12/2020: Pneumonia due to COVID-19 virus No date: Syphilis in  female     Comment:  Age 36 No date: Trichomonas No date: Yeast infection  Past Surgical History: 11/30/2011: CERVICAL CONIZATION W/BX     Comment:  Procedure: CONIZATION CERVIX WITH BIOPSY;  Surgeon:               Anselmo Kings, MD;  Location: [facility];  Service:               Gynecology;  Laterality: N/A; 03/16/2012: CERVICAL CONIZATION W/BX     Comment:  Procedure: CONIZATION CERVIX WITH BIOPSY;  Surgeon:               Lula Sale, MD;  Location: [facility];  Service:               Gynecology;  Laterality: N/A; No date: CONIZATION CERVIX     Comment:  x 3 01/14/2021: DECORTICATION; Right     Comment:  Procedure: DECORTICATION;  Surgeon: Zelphia Higashi, MD;  Location: [facility];  Service: Thoracic;  Laterality:              Right; 11/30/2011: DILATION AND CURETTAGE OF UTERUS     Comment:  Procedure: DILATATION AND CURETTAGE;  Surgeon: Anselmo Kings, MD;  Location: [facility];  Service: Gynecology;                Laterality: N/A; 01/14/2021: VIDEO ASSISTED THORACOSCOPY (VATS)/EMPYEMA; Right     Comment:  Procedure: VIDEO ASSISTED THORACOSCOPY (VATS)/DRAIN  EMPYEMA;  Surgeon: Zelphia Higashi, MD;  Location:               [facility] OR;  Service: Thoracic;  Laterality: Right; No date: WISDOM TOOTH EXTRACTION     Reproductive/Obstetrics negative OB ROS                             Anesthesia Physical Anesthesia Plan  ASA: 2  Anesthesia Plan: General ETT   Post-op Pain Management: Toradol  IV (intra-op)*, Ofirmev  IV (intra-op)* and Ketamine IV*   Induction: Intravenous  PONV Risk Score and Plan: 3 and Ondansetron , Dexamethasone , Midazolam  and Treatment may vary due to age or medical condition  Airway Management Planned: Oral ETT  Additional Equipment:   Intra-op Plan:   Post-operative Plan: Extubation in OR  Informed Consent: I have reviewed the patients History and Physical, chart, labs and  discussed the procedure including the risks, benefits and alternatives for the proposed anesthesia with the patient or authorized representative who has indicated his/her understanding and acceptance.     Dental Advisory Given  Plan Discussed with: Anesthesiologist, CRNA and Surgeon  Anesthesia Plan Comments: (Case cancelled 2/2 patient blood alcohol level 279 )       Anesthesia Quick Evaluation

## 2024-05-01 NOTE — H&P (Signed)
 Patient scheduled for umbilical hernia repair.  Nurses were concerned that she was inebriated upon presentation to the hospital.  We drew an alcohol level that was quite high.  I discussed with her that we cannot do her surgery today.  I am concerned about her alcohol use.  I encouraged her to follow-up with her primary care doctor and consult resources to stop alcohol use.  Unfortunately she had canceled 3 times and also rescheduled her surgery from last Friday.  At this time we will not plan to reschedule any further surgery for her.  If she is able to quit alcohol use then I discussed with her that she can call our office and we can talk about whether it is appropriate to schedule her for surgery.  I confirm that she does have a ride home with a friend as she is drunk.

## 2024-05-01 NOTE — Telephone Encounter (Signed)
 Patient showed up at her rescheduled surgery with an alcohol level of 279.  Surgery at this time will not be rescheduled.  If patient calls, will need to talk to Dr. Cornel Diesel before moving forward.  Patient has cancelled and no showed numerous times for appointment and surgery.

## 2024-05-06 ENCOUNTER — Other Ambulatory Visit: Payer: Self-pay | Admitting: Physician Assistant

## 2024-05-06 DIAGNOSIS — I10 Essential (primary) hypertension: Secondary | ICD-10-CM

## 2024-05-06 DIAGNOSIS — I160 Hypertensive urgency: Secondary | ICD-10-CM

## 2024-05-08 ENCOUNTER — Ambulatory Visit: Payer: MEDICAID | Admitting: Physician Assistant

## 2024-05-08 NOTE — Telephone Encounter (Signed)
 Requested by interface surescripts. Medication discontinued 03/29/24.  Requested Prescriptions  Refused Prescriptions Disp Refills   cloNIDine  (CATAPRES ) 0.1 MG tablet [Pharmacy Med Name: CLONIDINE  0.1MG  TABLETS] 15 tablet 0    Sig: TAKE 1 TABLET BY MOUTH AS NEEDED(FOR BLOOD PRESSURE>170/110)     Cardiovascular:  Alpha-2 Agonists Passed - 05/08/2024  1:41 PM      Passed - Last BP in normal range    BP Readings from Last 1 Encounters:  05/01/24 137/84         Passed - Last Heart Rate in normal range    Pulse Readings from Last 1 Encounters:  05/01/24 84         Passed - Valid encounter within last 6 months    Recent Outpatient Visits           1 month ago Essential hypertension   [facility], Arleen Lacer, [place]   2 months ago Essential hypertension   [facility] at [facility], Arleen Lacer, [place]   2 months ago Umbilical hernia without obstruction and without gangrene   [facility] at [facility], Arleen Lacer, [place]       Future Appointments             Today Sandridge, Brenda K, PA-C [facility]

## 2024-06-06 ENCOUNTER — Ambulatory Visit (INDEPENDENT_AMBULATORY_CARE_PROVIDER_SITE_OTHER): Payer: MEDICAID | Admitting: Physician Assistant

## 2024-06-06 ENCOUNTER — Encounter: Payer: Self-pay | Admitting: Physician Assistant

## 2024-06-06 VITALS — BP 126/82 | HR 106 | Temp 97.6°F | Ht 60.0 in | Wt 147.0 lb

## 2024-06-06 DIAGNOSIS — F1020 Alcohol dependence, uncomplicated: Secondary | ICD-10-CM | POA: Insufficient documentation

## 2024-06-06 DIAGNOSIS — E871 Hypo-osmolality and hyponatremia: Secondary | ICD-10-CM | POA: Diagnosis not present

## 2024-06-06 DIAGNOSIS — L02416 Cutaneous abscess of left lower limb: Secondary | ICD-10-CM

## 2024-06-06 DIAGNOSIS — I10 Essential (primary) hypertension: Secondary | ICD-10-CM | POA: Diagnosis not present

## 2024-06-06 DIAGNOSIS — R600 Localized edema: Secondary | ICD-10-CM

## 2024-06-06 MED ORDER — VALSARTAN 80 MG PO TABS: 80.0000 mg | ORAL_TABLET | Freq: Every day | ORAL | 1 refills | Status: DC

## 2024-06-06 NOTE — Progress Notes (Signed)
 Date:  06/06/2024   Name:  [name]   DOB:  15-Jun-1976   MRN:  [id number]   Chief Complaint: Skin Problem (Are not healing, drainage, painful, redness, warm to touch )  HPI [name] returns overdue for f/u (was supposed to return 04/08/24) mainly concerned with painful weeping bleeding BLE edema. Also thinks her ankle is infected again.   Historically poorly controlled HTN and abnormal electrolytes including persistent hyponatremia and hypochloremia. It has become clear this is from alcohol abuse.   She was scheduled for umbilical hernia surgery 04/26/24 to which she presented drunk. They were agreeable to rescheduling her surgery to 05/01/24 and she showed up drunk again. Surgeon fired her for showing up drunk for surgery twice in a row.   Seems to be particularly attached to clonidine  asks for another refill, but I previously discontinued this medication for her to avoid rebounding blood pressure.    Medication list has been reviewed and updated.  Current Meds  Medication Sig   diphenhydrAMINE  (BENADRYL ) 25 MG tablet Take 25 mg by mouth daily as needed for allergies.   ibuprofen  (ADVIL ) 200 MG tablet Take 200 mg by mouth every 6 (six) hours as needed for mild pain (pain score 1-3).   Multiple Vitamins-Minerals (HAIR SKIN & NAILS ADVANCED PO) Take 1 tablet by mouth daily.   Omega-3 Fatty Acids (FISH OIL) 1200 MG CAPS Take 1,200 mg by mouth as needed.   propranolol  (INDERAL ) 60 MG tablet Take 1 tablet (60 mg total) by mouth 2 (two) times daily.   [DISCONTINUED] amLODipine  (NORVASC ) 10 MG tablet Take 1 tablet (10 mg total) by mouth daily.   [DISCONTINUED] valsartan  (DIOVAN ) 80 MG tablet TAKE 1 TABLET(80 MG) BY MOUTH DAILY     Review of Systems  Patient Active Problem List   Diagnosis Date Noted   Alcohol use disorder, severe, dependence (HCC) 06/06/2024   Hypertensive urgency 03/29/2024   Dermatitis of external ear 03/29/2024   History of substance use disorder 03/29/2024    History of MRSA infection 03/29/2024   Periumbilical hernia 03/05/2024   Hypochloremia 03/05/2024   Hepatic steatosis 01/29/2024   Cholelithiasis without cholecystitis 01/29/2024   Prediabetes 10/09/2023   Wound of ankle 03/30/2023   Wound drainage 12/20/2022   Onychomycosis 09/08/2022   Hyponatremia 12/29/2021   Sinusitis, chronic 08/04/2021   Hot flashes 04/22/2021   History of HPV infection 04/22/2021   Rash 03/24/2021   Stasis dermatitis of both legs 02/04/2021   Anemia 01/09/2021   Essential hypertension 01/16/2020   Smoking 01/16/2020   Personality disorder (HCC) 09/01/2016   History of cocaine dependence (HCC) 09/01/2016   History of alcohol abuse 07/09/2014   Palpitations - evaluated by Dr. Floria Hurst in the past 02/20/2014   Anxiety 04/02/2012   History of opioid abuse (HCC) 03/18/2012   History of cervical cancer 01/02/2012   Migraine 04/08/2011    Allergies  Allergen Reactions   Doxycycline  Nausea And Vomiting    Vomiting even with food   Acetaminophen  Other (See Comments)    Intolerance, hx of tylenol  OD     There is no immunization history on file for this patient.  Past Surgical History:  Procedure Laterality Date   CERVICAL CONIZATION W/BX  11/30/2011   Procedure: CONIZATION CERVIX WITH BIOPSY;  Surgeon: Anselmo Kings, MD;  Location: [facility];  Service: Gynecology;  Laterality: N/A;   CERVICAL CONIZATION W/BX  03/16/2012   Procedure: CONIZATION CERVIX WITH BIOPSY;  Surgeon: Lula Sale, MD;  Location:  [facility];  Service: Gynecology;  Laterality: N/A;   CONIZATION CERVIX     x 3   DECORTICATION Right 01/14/2021   Procedure: DECORTICATION;  Surgeon: Zelphia Higashi, MD;  Location: [facility] OR;  Service: Thoracic;  Laterality: Right;   DILATION AND CURETTAGE OF UTERUS  11/30/2011   Procedure: DILATATION AND CURETTAGE;  Surgeon: Anselmo Kings, MD;  Location: [facility];  Service: Gynecology;  Laterality: N/A;   VIDEO ASSISTED THORACOSCOPY (VATS)/EMPYEMA  Right 01/14/2021   Procedure: VIDEO ASSISTED THORACOSCOPY (VATS)/DRAIN EMPYEMA;  Surgeon: Zelphia Higashi, MD;  Location: [facility] OR;  Service: Thoracic;  Laterality: Right;   WISDOM TOOTH EXTRACTION      Social History   Tobacco Use   Smoking status: Every Day    Current packs/day: 0.25    Average packs/day: 0.8 packs/day for 33.5 years (26.4 ttl pk-yrs)    Types: Cigarettes    Start date: 1992   Smokeless tobacco: Never   Tobacco comments:    Has cut down to 4-5 cigarettes per day  Vaping Use   Vaping status: Never Used  Substance Use Topics   Alcohol use: Yes    Alcohol/week: 20.0 standard drinks of alcohol    Types: 20 Standard drinks or equivalent per week    Comment: drinks hard seltzer 5%, 4-5 at a time, 4-5 days per week   Drug use: Not Currently    Types: Heroin, Cocaine    Comment: Abused rx drugs - on methadone 08/2011, stopped methadone  2007    Family History  Problem Relation Age of Onset   Cervical cancer Mother    Breast cancer Mother    Cancer Mother 41       Breast & cervical    Thyroid disease Mother    Stroke Maternal Grandmother    COPD Maternal Grandfather        Emphysema   Hypertension Paternal Grandfather    Stroke Paternal Grandfather    Heart attack Paternal Grandfather    Cancer Father 71       prostate   Hyperlipidemia Father    Other Paternal Grandmother        old age mid 60s        06/06/2024    3:47 PM 03/29/2024    3:34 PM 03/05/2024    2:52 PM 02/21/2024    3:59 PM  GAD 7 : Generalized Anxiety Score  Nervous, Anxious, on Edge 1 1 0 1  Control/stop worrying 0 0 1 1  Worry too much - different things 1 1 1 1   Trouble relaxing 1 2 2 2   Restless 0 0 0 0  Easily annoyed or irritable 2 2 2 2   Afraid - awful might happen 0 0 0 0  Total GAD 7 Score 5 6 6 7   Anxiety Difficulty Somewhat difficult Somewhat difficult Not difficult at all Somewhat difficult       06/06/2024    3:47 PM 03/29/2024    3:34 PM 03/05/2024    2:52 PM   Depression screen PHQ 2/9  Decreased Interest 0  1  Down, Depressed, Hopeless 0 0 1  PHQ - 2 Score 0 0 2  Altered sleeping  2 2  Tired, decreased energy  2 2  Change in appetite  2 2  Feeling bad or failure about yourself   0 0  Trouble concentrating  0 1  Moving slowly or fidgety/restless  0 0  Suicidal thoughts  0 0  PHQ-9 Score  6 9  Difficult doing work/chores  Somewhat difficult Somewhat difficult    BP Readings from Last 3 Encounters:  06/06/24 126/82  05/01/24 137/84  04/23/24 129/85    Wt Readings from Last 3 Encounters:  06/06/24 147 lb (66.7 kg)  05/01/24 140 lb (63.5 kg)  04/25/24 140 lb (63.5 kg)    BP 126/82   Pulse (!) 106   Temp 97.6 F (36.4 C)   Ht 5' (1.524 m)   Wt 147 lb (66.7 kg)   LMP 04/23/2019 (Approximate)   SpO2 97%   BMI 28.71 kg/m   Physical Exam Vitals and nursing note reviewed.  Constitutional:      Appearance: Normal appearance.   Cardiovascular:     Rate and Rhythm: Normal rate.     Comments: Severe BLE edema actively weeping clear-straw-colored fluid, numerous superficial insults to the skin with scant but recent bleeding.  Pulmonary:     Effort: Pulmonary effort is normal.  Abdominal:     General: There is no distension.   Musculoskeletal:        General: Normal range of motion.     Right lower leg: 3+ Edema present.     Left lower leg: 3+ Edema present.     Comments: Edematous, severely tender left ankle suspicious for recurrent abscess.   Feet:     Comments: Deeply split dry skin of right heel  Skin:    General: Skin is warm and dry.   Neurological:     Mental Status: She is alert and oriented to person, place, and time.     Gait: Gait is intact.   Psychiatric:        Mood and Affect: Mood and affect normal.     Recent Labs     Component Value Date/Time   NA 123 (L) 05/01/2024 0857   NA 122 (L) 03/29/2024 1630   NA 143 07/21/2014 1343   K 3.8 05/01/2024 0857   K 3.7 07/21/2014 1343   CL 89 (L)  05/01/2024 0857   CO2 23 05/01/2024 0857   CO2 25 07/21/2014 1343   GLUCOSE 112 (H) 05/01/2024 0857   GLUCOSE 95 07/21/2014 1343   BUN 7 05/01/2024 0857   BUN 5 (L) 03/29/2024 1630   BUN 4.6 (L) 07/21/2014 1343   CREATININE 0.47 05/01/2024 0857   CREATININE 0.6 07/21/2014 1343   CALCIUM  9.0 05/01/2024 0857   CALCIUM  8.9 07/21/2014 1343   PROT 8.1 03/29/2024 1630   PROT 7.0 07/21/2014 1343   ALBUMIN  4.4 03/29/2024 1630   ALBUMIN  3.2 (L) 07/21/2014 1343   AST 82 (H) 03/29/2024 1630   AST 301 (HH) 07/21/2014 1343   ALT 21 03/29/2024 1630   ALT 103 (H) 07/21/2014 1343   ALKPHOS 99 03/29/2024 1630   ALKPHOS 123 07/21/2014 1343   BILITOT 0.6 03/29/2024 1630   BILITOT 0.72 07/21/2014 1343   GFRNONAA >60 05/01/2024 0857   GFRAA 142 01/26/2021 1223    Lab Results  Component Value Date   WBC 8.0 05/01/2024   HGB 11.0 (L) 05/01/2024   HCT 30.7 (L) 05/01/2024   MCV 92.2 05/01/2024   PLT 236 05/01/2024   Lab Results  Component Value Date   HGBA1C 5.8 (H) 10/05/2023   Lab Results  Component Value Date   CHOL 191 03/29/2024   HDL 79 03/29/2024   LDLCALC 97 03/29/2024   TRIG 83 03/29/2024   CHOLHDL 2.4 03/29/2024   Lab Results  Component Value Date   TSH 2.740  01/10/2024     Assessment and Plan:  1. Peripheral edema (Primary) Discussed with patient this is severe edema with many small open wounds at high risk for infection especially with history of MRSA and probable ankle abscess. She needs to present to the ED but does not think she can make it today due to transportation issues. Advised her to go to ED tomorrow and I will be on the lookout for notes.   We redressed both lower extremities with gauze wrap and compression wrap. Advised to leave these bandages until she gets to the hospital. Advised to elevate the extremities as much as possible until then.   She asks for an antibiotic, but I fear that if I prescribe it to her, she will not go to the hospital for the care  she really needs (which she has done before with me). I will call her tomorrow to ensure compliance with plan.   2. Essential hypertension Stop amlodipine  to avoid worsening edema. Refill valsartan , continue propranolol , and monitor at home. Reiterated today I will not be prescribing clonidine .   - valsartan  (DIOVAN ) 80 MG tablet; Take 1 tablet (80 mg total) by mouth daily.  Dispense: 90 tablet; Refill: 1  3. Alcohol use disorder, severe, dependence (HCC) Patient refuses Antabuse and naltrexone. No interest in AA. Has a therapist but has not spoken to them in months. Referring to case management for substance abuse and transportation assistance.  - AMB Referral [facility] Care Management  4. Hyponatremia Likely from alcohol abuse. Needs IV electrolytes with multiple electrolyte depletions across several labs done here.   5. Abscess of skin of left ankle Plan for IV antibiotic covering for MRSA. Possible cellulitis of both legs as the cause/contributor to her swelling.     F/u TBD. Noncompliant with f/u previously. Need to check in with her tomorrow and make sure she went to hospital.   Today's visit billed for provider time of 50 minutes inclusive of chart review, physical exam, high acuity concern today, addressing chronic condition, physical exam, patient education and counseling, and discussing need for hospitalization.   Cody Das, PA-C, DMSc, Nutritionist [facility] and [facility] Medicine [facility] [phone]

## 2024-06-07 ENCOUNTER — Telehealth: Payer: Self-pay

## 2024-06-07 ENCOUNTER — Telehealth: Payer: Self-pay | Admitting: Physician Assistant

## 2024-06-07 NOTE — Progress Notes (Signed)
 Complex Care Management Note  Care Guide Note 06/07/2024 Name: [name] MRN: [id number] DOB: 01/10/1976  [name] is a 48 y.o. year old female who sees Leopoldo Rancher, [place] for primary care. I reached out to Alonza Arthurs by phone today to offer complex care management services.  Ms. Bolinger was given information about Complex Care Management services today including:   The Complex Care Management services include support from the care team which includes your Nurse Care Manager, Clinical Social Worker, or Pharmacist.  The Complex Care Management team is here to help remove barriers to the health concerns and goals most important to you. Complex Care Management services are voluntary, and the patient may decline or stop services at any time by request to their care team member.   Complex Care Management Consent Status: Patient agreed to services and verbal consent obtained.   Follow up plan:  Telephone appointment with complex care management team member scheduled for:  06/12/2024  Encounter Outcome:  Patient Scheduled  Lenton Rail , RMA     [place]  [facility], [facility] Guide  Direct Dial: [phone]  Website: [email or web address]

## 2024-06-07 NOTE — Telephone Encounter (Signed)
 Called and spoke with patient.  She has not yet gone to the hospital as advised yesterday.  She assures me that she is waiting on her ride and will go tonight.  Emphasized the importance of hospital evaluation given the acuity and severity of her presentation as described in yesterday's note.  Discussed with patient I feel she needs urgent workup, antibiotics, IV electrolyte resuscitation, etc. She verbalizes understanding and is grateful for the call.

## 2024-06-10 ENCOUNTER — Other Ambulatory Visit: Payer: Self-pay | Admitting: Physician Assistant

## 2024-06-10 DIAGNOSIS — I10 Essential (primary) hypertension: Secondary | ICD-10-CM

## 2024-06-12 ENCOUNTER — Other Ambulatory Visit: Payer: MEDICAID | Admitting: Licensed Clinical Social Worker

## 2024-06-12 NOTE — Telephone Encounter (Signed)
 Dc'd 06/06/24 side effects by Toribio Hoyle PA  Requested Prescriptions  Refused Prescriptions Disp Refills   amLODipine  (NORVASC ) 10 MG tablet [Pharmacy Med Name: AMLODIPINE  BESYLATE 10MG TABLETS] 30 tablet 1    Sig: TAKE 1 TABLET(10 MG) BY MOUTH DAILY     Cardiovascular: Calcium  Channel Blockers 2 Passed - 06/12/2024 11:52 AM      Passed - Last BP in normal range    BP Readings from Last 1 Encounters:  06/06/24 126/82         Passed - Last Heart Rate in normal range    Pulse Readings from Last 1 Encounters:  06/06/24 (!) 106         Passed - Valid encounter within last 6 months    Recent Outpatient Visits           6 days ago Peripheral edema   [facility] at [facility], Toribio SQUIBB, [place]   2 months ago Essential hypertension   [facility] at [facility], Toribio [place], [place]   3 months ago Essential hypertension   [facility] Medicine at [facility], Toribio [place], [place]   3 months ago Umbilical hernia without obstruction and without gangrene   [name] at [facility], Toribio [place], [place]

## 2024-06-13 NOTE — Patient Outreach (Signed)
 Complex Care Management   Visit Note  06/13/2024  Name:  [name] MRN: [id number] DOB: [birth year]  Situation: Referral received for Complex Care Management related to Substance Abuse/Misuse substance use I obtained verbal consent from Patient.  Visit completed with [name]  on the phone. 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   Alcohol abuse    Allergy    Anxiety    Bacterial infection    Cervical intraepithelial neoplasia III    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 (HCC) 12/2020   CIN III (cervical intraepithelial neoplasia grade III) with severe dysplasia 12/19/2005   Condyloma 12/19/2009   H/O fatigue 12/20/2007   H/O varicella    Headache(784.0)    Hepatic steatosis    History of cocaine use    HPV (human papilloma virus) anogenital infection    Hx of dizziness 09/20/2011   Hx: UTI (urinary tract infection)    Back pain   Hypertension    Irregular menstrual cycle    IV drug user    MRSA (methicillin resistant Staphylococcus aureus) colonization 01/13/2021   PCR   Palpitations 12/19/2008   Pelvic pain in female 12/05/2011   Pneumonia due to COVID-19 virus 12/2020   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    Age 24   Trichomonas    Yeast infection     Assessment: Patient Reported Symptoms:  Cognitive Cognitive Status: Able to follow simple commands, Alert and oriented to person, place, and time, Normal speech and language skills Cognitive/Intellectual Conditions Management [RPT]: None reported or documented in medical history or problem list      Neurological Neurological Review of Symptoms: No symptoms reported    HEENT HEENT Symptoms Reported: No symptoms reported      Cardiovascular Cardiovascular Symptoms Reported: No symptoms reported Does patient have uncontrolled  Hypertension?: No    Respiratory Respiratory Symptoms Reported: No symptoms reported    Endocrine Patient reports the following symptoms related to hypoglycemia or hyperglycemia : No symptoms reported Is patient diabetic?: No    Gastrointestinal Gastrointestinal Symptoms Reported: No symptoms reported      Genitourinary Genitourinary Symptoms Reported: No symptoms reported    Integumentary Integumentary Symptoms Reported: No symptoms reported    Musculoskeletal Musculoskelatal Symptoms Reviewed: No symptoms reported   Falls in the past year?: No Number of falls in past year: 1 or less Was there an injury with Fall?: No Fall Risk Category Calculator: 0 Patient Fall Risk Level: Low Fall Risk Patient at Risk for Falls Due to: No Fall Risks  Psychosocial Psychosocial Symptoms Reported: Other Behavioral Health Conditions: Substance use   Quality of Family Relationships: involved, supportive Do you feel physically threatened by others?: No      06/06/2024    3:47 PM  Depression screen PHQ 2/9  Decreased Interest 0  Down, Depressed, Hopeless 0  PHQ - 2 Score 0    There were no vitals filed for this visit.  Medications Reviewed Today     Reviewed by Clement Odor, LCSW (Social Worker) on 06/13/24 at 1531  Med List Status: <None>   Medication Order Taking? Sig Documenting Provider Last Dose Status Informant  diphenhydrAMINE  (BENADRYL ) 25 MG tablet [id number] Yes Take 25 mg by mouth daily as needed for allergies. [provider]  Active Self, Pharmacy Records  ibuprofen  (ADVIL ) 200 MG  tablet [id number] Yes Take 200 mg by mouth every 6 (six) hours as needed for mild pain (pain score 1-3). [provider]  Active   Multiple Vitamins-Minerals (HAIR SKIN & NAILS ADVANCED PO) [id number] Yes Take 1 tablet by mouth daily. [provider]  Active Self, Pharmacy Records  mupirocin  ointment (BACTROBAN ) 2 % [id number] Yes Apply 1 Application topically 2 (two) times  daily. Replaces Neosporin for the ear Manya Toribio SQUIBB, PA  Active Self, Pharmacy Records  Omega-3 Fatty Acids (FISH OIL) 1200 MG CAPS [id number] Yes Take 1,200 mg by mouth as needed. [provider]  Active Self, Pharmacy Records           Med Note [name]   Wed Apr 24, 2024  3:19 PM)    propranolol  (INDERAL ) 60 MG tablet [id number] Yes Take 1 tablet (60 mg total) by mouth 2 (two) times daily. Manya Toribio SQUIBB, PA  Active Self, Pharmacy Records  valsartan  (DIOVAN ) 80 MG tablet [id number] Yes Take 1 tablet (80 mg total) by mouth daily. Manya Toribio SQUIBB, PA  Active             Recommendation:   [name] provided with contact information for ADS and neuropsych center  Follow Up Plan:   Telephone follow up appointment date/time:  06/20/2024  [name] MSW, LCSW Licensed Clinical Social Worker  [facility], Population Health Direct Dial: [phone]  Fax: [phone]

## 2024-06-13 NOTE — Patient Instructions (Signed)
 Visit Information  Thank you for taking time to visit with me today. Please don't hesitate to contact me if I can be of assistance to you before our next scheduled appointment.  Your next care management appointment is by telephone on 06/20/2024 at 11am  Telephone follow up appointment date/time:  06/20/2024  Please call the care guide team at [phone] if you need to cancel, schedule, or reschedule an appointment.   Please call 911 if you are experiencing a Mental Health or Behavioral Health Crisis or need someone to talk to.  Alfonso Rummer MSW, LCSW Licensed Clinical Social Worker  [facility], Population Health Direct Dial: [phone]  Fax: [phone]

## 2024-07-12 ENCOUNTER — Other Ambulatory Visit: Payer: Self-pay | Admitting: Licensed Clinical Social Worker

## 2024-07-12 NOTE — Patient Outreach (Signed)
 Complex Care Management   Visit Note  07/12/2024  Name:  [name] MRN: [id number] DOB: [birth year]  Situation: Referral received for Complex Care Management related to Substance Abuse/Misuse substance use I obtained verbal consent from Patient.  Visit completed with L Burdett  on the phone. Pt reports she has contact information for the susbstance treatment provided. 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   Alcohol abuse    Allergy    Anxiety    Bacterial infection    Cervical intraepithelial neoplasia III    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 (HCC) 12/2020   CIN III (cervical intraepithelial neoplasia grade III) with severe dysplasia 12/19/2005   Condyloma 12/19/2009   H/O fatigue 12/20/2007   H/O varicella    Headache(784.0)    Hepatic steatosis    History of cocaine use    HPV (human papilloma virus) anogenital infection    Hx of dizziness 09/20/2011   Hx: UTI (urinary tract infection)    Back pain   Hypertension    Irregular menstrual cycle    IV drug user    MRSA (methicillin resistant Staphylococcus aureus) colonization 01/13/2021   PCR   Palpitations 12/19/2008   Pelvic pain in female 12/05/2011   Pneumonia due to COVID-19 virus 12/2020   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    Age 84   Trichomonas    Yeast infection     Assessment: Patient Reported Symptoms:  Cognitive Cognitive Status: Able to follow simple commands Cognitive/Intellectual Conditions Management [RPT]: None reported or documented in medical history or problem list      Neurological Neurological Review of Symptoms: No symptoms reported    HEENT HEENT Symptoms Reported: No symptoms reported      Cardiovascular Cardiovascular Symptoms Reported: No symptoms reported Does patient have uncontrolled  Hypertension?: No    Respiratory      Endocrine Endocrine Symptoms Reported: No symptoms reported Is patient diabetic?: No    Gastrointestinal Gastrointestinal Symptoms Reported: No symptoms reported      Genitourinary Genitourinary Symptoms Reported: No symptoms reported    Integumentary Integumentary Symptoms Reported: No symptoms reported    Musculoskeletal Musculoskelatal Symptoms Reviewed: No symptoms reported        Psychosocial       Quality of Family Relationships: supportive Do you feel physically threatened by others?: No      06/06/2024    3:47 PM  Depression screen PHQ 2/9  Decreased Interest 0  Down, Depressed, Hopeless 0  PHQ - 2 Score 0    There were no vitals filed for this visit.  Medications Reviewed Today   Medications were not reviewed in this encounter     Recommendation:   Continue Current Plan of Care  Follow Up Plan:   Patient has met all care management goals. Care Management case will be closed. Patient has been provided contact information should new needs arise.   Alfonso Rummer MSW, LCSW Licensed Clinical Social Worker  [facility] Population Health Direct Dial: [phone]  Fax: [phone]

## 2024-07-12 NOTE — Patient Instructions (Signed)

## 2024-09-11 ENCOUNTER — Ambulatory Visit: Payer: MEDICAID | Admitting: Physician Assistant

## 2024-09-12 ENCOUNTER — Encounter: Payer: Self-pay | Admitting: Physician Assistant

## 2024-09-12 ENCOUNTER — Ambulatory Visit (INDEPENDENT_AMBULATORY_CARE_PROVIDER_SITE_OTHER): Payer: MEDICAID | Admitting: Physician Assistant

## 2024-09-12 VITALS — BP 160/96 | HR 80 | Temp 98.5°F | Ht 60.0 in | Wt 129.0 lb

## 2024-09-12 DIAGNOSIS — R002 Palpitations: Secondary | ICD-10-CM

## 2024-09-12 DIAGNOSIS — E871 Hypo-osmolality and hyponatremia: Secondary | ICD-10-CM

## 2024-09-12 DIAGNOSIS — I10 Essential (primary) hypertension: Secondary | ICD-10-CM | POA: Diagnosis not present

## 2024-09-12 DIAGNOSIS — F1020 Alcohol dependence, uncomplicated: Secondary | ICD-10-CM

## 2024-09-12 DIAGNOSIS — R600 Localized edema: Secondary | ICD-10-CM

## 2024-09-12 DIAGNOSIS — R21 Rash and other nonspecific skin eruption: Secondary | ICD-10-CM

## 2024-09-12 DIAGNOSIS — R634 Abnormal weight loss: Secondary | ICD-10-CM

## 2024-09-12 DIAGNOSIS — Z87898 Personal history of other specified conditions: Secondary | ICD-10-CM

## 2024-09-12 MED ORDER — PROPRANOLOL HCL 60 MG PO TABS: 60.0000 mg | ORAL_TABLET | Freq: Two times a day (BID) | ORAL | 1 refills | Status: AC

## 2024-09-12 NOTE — Assessment & Plan Note (Signed)
 Plan for detox program at [facility] in [place].

## 2024-09-12 NOTE — Assessment & Plan Note (Signed)
 Repeat CMP.  Patient needs to reduce alcohol consumption.

## 2024-09-12 NOTE — Progress Notes (Signed)
 Date:  09/12/2024   Name:  [name]   DOB:  1976-08-10   MRN:  [id number]   Chief Complaint: Hypertension  HPI Quincee returns overdue for f/u on chronic conditions, mainly HTN and alcohol use disorder. At our last visit, I have concerns for cellulitis +/- abscess and advised hospital admission. She never went to the ED despite my insistence.   We were previously concerned with painful weeping bleeding BLE edema but this seems to have improved since stopping amlodipine . At present she has some peculiar pruritic painful rash of both legs (R>L) that she calls a parasitic problem. Says her roommate has a dog and old furniture. She has not seen any bugs but feels like she is being bitten on her legs.   Historically poorly controlled HTN. Not monitoring recently despite having a cuff. Still seems to be particularly attached to clonidine  and asks to restart, but I previously discontinued this medication for her to avoid rebounding blood pressure and have refused it several times since. It is a mystery why she feels she needs to take it regularly.   We have been monitoring abnormal electrolytes including persistent hyponatremia and hypochloremia. It has become clear this is from alcohol abuse about which she was not originally entirely truthful. At present she endorses drinking 5 Truly's daily, says this is better than previous but this is actually worse than what she had told me in the past. She was scheduled for umbilical hernia surgery 04/26/24 to which she presented intoxicated. They were agreeable to rescheduling her surgery to 05/01/24 and she showed up intoxicated again. Surgeon fired her. She would be willing to go to detox either inpatient or outpatient but does not want to use naltrexone citing a bad history with this medication.   She has a history of polysubstance abuse but assures me that is in the past. We were not able to get a UDS on her in March and she does not think she can go  today either. She is willing to test, even asking if I can check a drug screen in the blood, but does not think she could urinate.    Medication list has been reviewed and updated.  Current Meds  Medication Sig   diphenhydrAMINE  (BENADRYL ) 25 MG tablet Take 25 mg by mouth daily as needed for allergies.   ibuprofen  (ADVIL ) 200 MG tablet Take 200 mg by mouth every 6 (six) hours as needed for mild pain (pain score 1-3).   Multiple Vitamins-Minerals (HAIR SKIN & NAILS ADVANCED PO) Take 1 tablet by mouth daily.   Omega-3 Fatty Acids (FISH OIL) 1200 MG CAPS Take 1,200 mg by mouth as needed.   valsartan  (DIOVAN ) 80 MG tablet Take 1 tablet (80 mg total) by mouth daily.   [DISCONTINUED] mupirocin  ointment (BACTROBAN ) 2 % Apply 1 Application topically 2 (two) times daily. Replaces Neosporin for the ear     Review of Systems  Patient Active Problem List   Diagnosis Date Noted   Alcohol use disorder, severe, dependence (HCC) 06/06/2024   Hypertensive urgency 03/29/2024   Dermatitis of external ear 03/29/2024   History of substance use disorder 03/29/2024   History of MRSA infection 03/29/2024   Periumbilical hernia 03/05/2024   Hypochloremia 03/05/2024   Hepatic steatosis 01/29/2024   Cholelithiasis without cholecystitis 01/29/2024   Prediabetes 10/09/2023   Wound of ankle 03/30/2023   Wound drainage 12/20/2022   Onychomycosis 09/08/2022   Hyponatremia 12/29/2021   Sinusitis, chronic 08/04/2021   Hot  flashes 04/22/2021   History of HPV infection 04/22/2021   Rash 03/24/2021   Stasis dermatitis of both legs 02/04/2021   Anemia 01/09/2021   Essential hypertension 01/16/2020   Smoking 01/16/2020   Personality disorder (HCC) 09/01/2016   History of cocaine dependence (HCC) 09/01/2016   History of alcohol abuse 07/09/2014   Palpitations - evaluated by Dr. Calhoun in the past 02/20/2014   Anxiety 04/02/2012   History of opioid abuse (HCC) 03/18/2012   History of cervical cancer  01/02/2012   Migraine 04/08/2011    Allergies  Allergen Reactions   Doxycycline  Nausea And Vomiting    Vomiting even with food   Acetaminophen  Other (See Comments)    Intolerance, hx of tylenol  OD     There is no immunization history on file for this patient.  Past Surgical History:  Procedure Laterality Date   CERVICAL CONIZATION W/BX  11/30/2011   Procedure: CONIZATION CERVIX WITH BIOPSY;  Surgeon: Rome LULLA Rigg, MD;  Location: [facility];  Service: Gynecology;  Laterality: N/A;   CERVICAL CONIZATION W/BX  03/16/2012   Procedure: CONIZATION CERVIX WITH BIOPSY;  Surgeon: Rome Rigg, MD;  Location: [facility];  Service: Gynecology;  Laterality: N/A;   CONIZATION CERVIX     x 3   DECORTICATION Right 01/14/2021   Procedure: DECORTICATION;  Surgeon: Kerrin Elspeth BROCKS, MD;  Location[facility];  Service: Thoracic;  Laterality: Right;   DILATION AND CURETTAGE OF UTERUS  11/30/2011   Procedure: DILATATION AND CURETTAGE;  Surgeon: Rome LULLA Rigg, MD;  Location: [facility];  Service: Gynecology;  Laterality: N/A;   VIDEO ASSISTED THORACOSCOPY (VATS)/EMPYEMA Right 01/14/2021   Procedure: VIDEO ASSISTED THORACOSCOPY (VATS)/DRAIN EMPYEMA;  Surgeon: Kerrin Elspeth BROCKS, MD;  Location: [facility] OR;  Service: Thoracic;  Laterality: Right;   WISDOM TOOTH EXTRACTION      Social History   Tobacco Use   Smoking status: Every Day    Current packs/day: 0.25    Average packs/day: 0.8 packs/day for 33.7 years (26.4 ttl pk-yrs)    Types: Cigarettes    Start date: 1992   Smokeless tobacco: Never   Tobacco comments:    Has cut down to 4-5 cigarettes per day  Vaping Use   Vaping status: Never Used  Substance Use Topics   Alcohol use: Yes    Alcohol/week: 20.0 standard drinks of alcohol    Types: 20 Standard drinks or equivalent per week    Comment: drinks hard seltzer 5%, 5 of them daily   Drug use: Not Currently    Types: Heroin, Cocaine    Comment: Abused rx drugs - on methadone 08/2011,  stopped methadone  2007    Family History  Problem Relation Age of Onset   Cervical cancer Mother    Breast cancer Mother    Cancer Mother 2       Breast & cervical    Thyroid disease Mother    Stroke Maternal Grandmother    COPD Maternal Grandfather        Emphysema   Hypertension Paternal Grandfather    Stroke Paternal Grandfather    Heart attack Paternal Grandfather    Cancer Father 6       prostate   Hyperlipidemia Father    Other Paternal Grandmother        old age mid 77s        09/12/2024    2:16 PM 06/06/2024    3:47 PM 03/29/2024    3:34 PM 03/05/2024    2:52 PM  GAD 7 : Generalized Anxiety Score  Nervous, Anxious, on Edge 0 1 1 0  Control/stop worrying 0 0 0 1  Worry too much - different things 0 1 1 1   Trouble relaxing 0 1 2 2   Restless 0 0 0 0  Easily annoyed or irritable 2 2 2 2   Afraid - awful might happen 0 0 0 0  Total GAD 7 Score 2 5 6 6   Anxiety Difficulty Somewhat difficult Somewhat difficult Somewhat difficult Not difficult at all       09/12/2024    2:16 PM 06/06/2024    3:47 PM 03/29/2024    3:34 PM  Depression screen PHQ 2/9  Decreased Interest 0 0   Down, Depressed, Hopeless 0 0 0  PHQ - 2 Score 0 0 0  Altered sleeping   2  Tired, decreased energy   2  Change in appetite   2  Feeling bad or failure about yourself    0  Trouble concentrating   0  Moving slowly or fidgety/restless   0  Suicidal thoughts   0  PHQ-9 Score   6  Difficult doing work/chores   Somewhat difficult    BP Readings from Last 3 Encounters:  09/12/24 (!) 160/96  06/06/24 126/82  05/01/24 137/84    Wt Readings from Last 3 Encounters:  09/12/24 129 lb (58.5 kg)  06/06/24 147 lb (66.7 kg)  05/01/24 140 lb (63.5 kg)    BP (!) 160/96 (Cuff Size: Normal)   Pulse 80   Temp 98.5 F (36.9 C)   Ht 5' (1.524 m)   Wt 129 lb (58.5 kg)   LMP 04/23/2019 (Approximate)   SpO2 97%   BMI 25.19 kg/m   Physical Exam Vitals and nursing note reviewed.   Constitutional:      Appearance: Normal appearance.  Cardiovascular:     Rate and Rhythm: Normal rate.  Pulmonary:     Effort: Pulmonary effort is normal.  Abdominal:     General: There is no distension.  Musculoskeletal:        General: Normal range of motion.  Skin:    General: Skin is warm and dry.     Comments: Presents with right leg wrapped in gauze. Unwrapping reveals diffuse maculopapular rash with scabbing BLE R>L. The right leg is TTP out of proportion to exam (this is common for her).   Neurological:     Mental Status: She is alert and oriented to person, place, and time.     Gait: Gait is intact.  Psychiatric:        Mood and Affect: Mood and affect normal.      Recent Labs     Component Value Date/Time   NA 123 (L) 05/01/2024 0857   NA 122 (L) 03/29/2024 1630   NA 143 07/21/2014 1343   K 3.8 05/01/2024 0857   K 3.7 07/21/2014 1343   CL 89 (L) 05/01/2024 0857   CO2 23 05/01/2024 0857   CO2 25 07/21/2014 1343   GLUCOSE 112 (H) 05/01/2024 0857   GLUCOSE 95 07/21/2014 1343   BUN 7 05/01/2024 0857   BUN 5 (L) 03/29/2024 1630   BUN 4.6 (L) 07/21/2014 1343   CREATININE 0.47 05/01/2024 0857   CREATININE 0.6 07/21/2014 1343   CALCIUM  9.0 05/01/2024 0857   CALCIUM  8.9 07/21/2014 1343   PROT 8.1 03/29/2024 1630   PROT 7.0 07/21/2014 1343   ALBUMIN  4.4 03/29/2024 1630   ALBUMIN  3.2 (L) 07/21/2014 1343  AST 82 (H) 03/29/2024 1630   AST 301 (HH) 07/21/2014 1343   ALT 21 03/29/2024 1630   ALT 103 (H) 07/21/2014 1343   ALKPHOS 99 03/29/2024 1630   ALKPHOS 123 07/21/2014 1343   BILITOT 0.6 03/29/2024 1630   BILITOT 0.72 07/21/2014 1343   GFRNONAA >60 05/01/2024 0857   GFRAA 142 01/26/2021 1223    Lab Results  Component Value Date   WBC 8.0 05/01/2024   HGB 11.0 (L) 05/01/2024   HCT 30.7 (L) 05/01/2024   MCV 92.2 05/01/2024   PLT 236 05/01/2024   Lab Results  Component Value Date   HGBA1C 5.8 (H) 10/05/2023   HGBA1C 5.6 03/30/2023   HGBA1C 5.4  02/05/2020   Lab Results  Component Value Date   CHOL 191 03/29/2024   HDL 79 03/29/2024   LDLCALC 97 03/29/2024   TRIG 83 03/29/2024   CHOLHDL 2.4 03/29/2024   Lab Results  Component Value Date   TSH 2.740 01/10/2024      Assessment and Plan:  Essential hypertension Assessment & Plan: Patient ran out of propranolol  a couple weeks ago.  Refilling today, encouraged to monitor home BP, bring a log for my review next time with short interval follow-up in 2-4 weeks  Orders: -     Propranolol  HCl; Take 1 tablet (60 mg total) by mouth 2 (two) times daily.  Dispense: 180 tablet; Refill: 1 -     CBC with Differential/Platelet -     Comprehensive metabolic panel with GFR  Palpitations Assessment & Plan: Refill propranolol  which were also using for HTN  Orders: -     Propranolol  HCl; Take 1 tablet (60 mg total) by mouth 2 (two) times daily.  Dispense: 180 tablet; Refill: 1 -     TSH  Alcohol use disorder, severe, dependence (HCC) Assessment & Plan: Plan for detox program at [facility] in [place].  Orders: -     CBC with Differential/Platelet -     Comprehensive metabolic panel with GFR -     Ethanol  Peripheral edema  Hyponatremia Assessment & Plan: Repeat CMP.  Patient needs to reduce alcohol consumption.   Weight loss -     CBC with Differential/Platelet -     Comprehensive metabolic panel with GFR -     TSH  History of substance use disorder Assessment & Plan: Check UDS if able today.  If unable to void, plan to check at her earliest convenience.  Orders: -     Drug Screen, Urine  Rash Assessment & Plan: Uncertain etiology. Refer to dermatology for further evaluation.   Orders: -     Ambulatory referral to Dermatology   I personally spent a total of 60 minutes in the care of the patient today including preparing to see the patient, performing a medically appropriate exam/evaluation, counseling and educating, placing orders, referring and communicating  with other health care professionals, documenting clinical information in the EHR, and coordinating care.   No follow-ups on file.    Rolan Hoyle, PA-C, DMSc, Nutritionist [facility] [phone]

## 2024-09-12 NOTE — Assessment & Plan Note (Signed)
 Patient ran out of propranolol  a couple weeks ago.  Refilling today, encouraged to monitor home BP, bring a log for my review next time with short interval follow-up in 2-4 weeks

## 2024-09-12 NOTE — Assessment & Plan Note (Addendum)
 Refill propranolol  which were also using for HTN

## 2024-09-12 NOTE — Assessment & Plan Note (Signed)
 Check UDS if able today.  If unable to void, plan to check at her earliest convenience.

## 2024-09-12 NOTE — Assessment & Plan Note (Signed)
 Uncertain etiology. Refer to dermatology for further evaluation.

## 2024-09-16 ENCOUNTER — Telehealth: Payer: Self-pay | Admitting: Physician Assistant

## 2024-09-16 NOTE — Telephone Encounter (Signed)
 Spoke with patient regarding detox center I found for her.  Was given information for [facility] [place].  Phone [phone].  Patient verbalizes intent to go this week.

## 2024-09-17 ENCOUNTER — Ambulatory Visit: Payer: Self-pay | Admitting: Physician Assistant

## 2024-09-17 LAB — COMPREHENSIVE METABOLIC PANEL WITH GFR
ALT: 28 IU/L (ref 0–32)
AST: 110 IU/L — ABNORMAL HIGH (ref 0–40)
Albumin: 4.5 g/dL (ref 3.9–4.9)
Alkaline Phosphatase: 77 IU/L (ref 41–116)
BUN/Creatinine Ratio: 6 — ABNORMAL LOW (ref 9–23)
BUN: 4 mg/dL — ABNORMAL LOW (ref 6–24)
Bilirubin Total: 0.8 mg/dL (ref 0.0–1.2)
CO2: 16 mmol/L — ABNORMAL LOW (ref 20–29)
Calcium: 10.1 mg/dL (ref 8.7–10.2)
Chloride: 82 mmol/L — ABNORMAL LOW (ref 96–106)
Creatinine, Ser: 0.65 mg/dL (ref 0.57–1.00)
Globulin, Total: 3.5 g/dL (ref 1.5–4.5)
Glucose: 112 mg/dL — ABNORMAL HIGH (ref 70–99)
Potassium: 4 mmol/L (ref 3.5–5.2)
Sodium: 120 mmol/L — ABNORMAL LOW (ref 134–144)
Total Protein: 8 g/dL (ref 6.0–8.5)
eGFR: 107 mL/min/1.73 (ref >59)

## 2024-09-17 LAB — CBC WITH DIFFERENTIAL/PLATELET
Basophils Absolute: 0 x10E3/uL (ref 0.0–0.2)
Basos: 0 %
EOS (ABSOLUTE): 0.1 x10E3/uL (ref 0.0–0.4)
Eos: 1 %
Hematocrit: 34.4 % (ref 34.0–46.6)
Hemoglobin: 12.2 g/dL (ref 11.1–15.9)
Immature Grans (Abs): 0 x10E3/uL (ref 0.0–0.1)
Immature Granulocytes: 0 %
Lymphocytes Absolute: 1.7 x10E3/uL (ref 0.7–3.1)
Lymphs: 21 %
MCH: 33.1 pg — ABNORMAL HIGH (ref 26.6–33.0)
MCHC: 35.5 g/dL (ref 31.5–35.7)
MCV: 93 fL (ref 79–97)
Monocytes Absolute: 1 x10E3/uL — ABNORMAL HIGH (ref 0.1–0.9)
Monocytes: 12 %
Neutrophils Absolute: 5.3 x10E3/uL (ref 1.4–7.0)
Neutrophils: 66 %
Platelets: 156 x10E3/uL (ref 150–450)
RBC: 3.69 x10E6/uL — ABNORMAL LOW (ref 3.77–5.28)
RDW: 12 % (ref 11.7–15.4)
WBC: 8.1 x10E3/uL (ref 3.4–10.8)

## 2024-09-17 LAB — TSH: TSH: 2.13 u[IU]/mL (ref 0.450–4.500)

## 2024-09-17 LAB — ETHANOL: Ethanol: 0.143 %

## 2024-09-24 ENCOUNTER — Other Ambulatory Visit: Payer: Self-pay | Admitting: Physician Assistant

## 2024-09-24 DIAGNOSIS — F1020 Alcohol dependence, uncomplicated: Secondary | ICD-10-CM

## 2024-09-26 ENCOUNTER — Telehealth: Payer: Self-pay

## 2024-09-26 ENCOUNTER — Telehealth: Payer: Self-pay | Admitting: Physician Assistant

## 2024-09-26 ENCOUNTER — Other Ambulatory Visit: Payer: Self-pay | Admitting: Physician Assistant

## 2024-09-26 NOTE — Telephone Encounter (Signed)
 Spoke with patient today via phone after her transition of care call with my CMA.  She had a fall due to alcohol intoxication resulting in scalp laceration requiring 5 staples.  To my surprise, she has still not called [facility] despite me stressing the importance of this 2 days ago.  I reiterated this to her, and encouraged her to call today.  I am not optimistic that she will do so.

## 2024-09-26 NOTE — Transitions of Care (Post Inpatient/ED Visit) (Signed)
   09/26/2024  Name: [name] MRN: [id number] DOB: [birth year]  Today's TOC FU Call Status: Today's TOC FU Call Status:: Successful TOC FU Call Completed TOC FU Call Complete Date: 09/25/24 Patient's Name and Date of Birth confirmed.  Transition Care Management Follow-up Telephone Call Date of Discharge: 09/25/24 Discharge Facility: Other [facility]) Name of Other (Non[facility]) Discharge Facility: [facility] Type of Discharge: Emergency Department Reason for ED Visit: Other: (Laceration without foreign body of scalp) How have you been since you were released from the hospital?: Better Any questions or concerns?: No  Items Reviewed: Did you receive and understand the discharge instructions provided?: Yes Medications obtained,verified, and reconciled?: Yes (Medications Reviewed) Any new allergies since your discharge?: No Dietary orders reviewed?: NA Do you have support at home?: Yes  Medications Reviewed Today: Medications Reviewed Today     Reviewed by Younes Degeorge, Steva SAILOR, CMA (Certified Medical Assistant) on 09/26/24 at [phone]  Med List Status: <None>   Medication Order Taking? Sig Documenting Provider Last Dose Status Informant  bacitracin  500 UNIT/GM ointment [id number]  Apply 1 Application topically. [provider]  Active   diphenhydrAMINE  (BENADRYL ) 25 MG tablet [id number]  Take 25 mg by mouth daily as needed for allergies. [provider]  Active Self, Pharmacy Records  ibuprofen  (ADVIL ) 200 MG tablet [id number]  Take 200 mg by mouth every 6 (six) hours as needed for mild pain (pain score 1-3). [provider]  Active   Multiple Vitamins-Minerals (HAIR SKIN & NAILS ADVANCED PO) [id number]  Take 1 tablet by mouth daily. [provider]  Active Self, Pharmacy Records  Omega-3 Fatty Acids (FISH OIL) 1200 MG CAPS [id number]  Take 1,200 mg by mouth as needed. [provider]  Active Self, Pharmacy Records            Med Note JACKOLYN WADDELL DEL   Wed Apr 24, 2024  3:19 PM)    propranolol  (INDERAL ) 60 MG tablet [id number]  Take 1 tablet (60 mg total) by mouth 2 (two) times daily. Manya Toribio SQUIBB, PA  Active   valsartan  (DIOVAN ) 80 MG tablet [id number]  Take 1 tablet (80 mg total) by mouth daily. Manya Toribio SQUIBB, PA  Active             Home Care and Equipment/Supplies: Were Home Health Services Ordered?: NA Any new equipment or medical supplies ordered?: NA  Functional Questionnaire: Do you need assistance with bathing/showering or dressing?: No Do you need assistance with meal preparation?: No Do you need assistance with eating?: No Do you have difficulty maintaining continence: No Do you need assistance with getting out of bed/getting out of a chair/moving?: No Do you have difficulty managing or taking your medications?: No  Follow up appointments reviewed: PCP Follow-up appointment confirmed?: Yes Date of PCP follow-up appointment?: 10/04/24 Follow-up Provider: Rolan Manya Specialist [facility] Follow-up appointment confirmed?: NA Do you need transportation to your follow-up appointment?: No Do you understand care options if your condition(s) worsen?: Yes-patient verbalized understanding    SIGNATURE Steva Anil Havard, CMA

## 2024-09-30 ENCOUNTER — Ambulatory Visit: Payer: MEDICAID

## 2024-10-03 ENCOUNTER — Ambulatory Visit: Payer: MEDICAID | Admitting: Physician Assistant

## 2024-10-04 ENCOUNTER — Ambulatory Visit (INDEPENDENT_AMBULATORY_CARE_PROVIDER_SITE_OTHER): Payer: MEDICAID | Admitting: Physician Assistant

## 2024-10-04 ENCOUNTER — Encounter: Payer: Self-pay | Admitting: Physician Assistant

## 2024-10-04 VITALS — BP 162/98 | HR 76 | Temp 98.1°F | Ht 60.0 in | Wt 132.0 lb

## 2024-10-04 DIAGNOSIS — S0101XD Laceration without foreign body of scalp, subsequent encounter: Secondary | ICD-10-CM | POA: Diagnosis not present

## 2024-10-04 DIAGNOSIS — F1111 Opioid abuse, in remission: Secondary | ICD-10-CM | POA: Diagnosis not present

## 2024-10-04 DIAGNOSIS — E871 Hypo-osmolality and hyponatremia: Secondary | ICD-10-CM | POA: Diagnosis not present

## 2024-10-04 DIAGNOSIS — F1020 Alcohol dependence, uncomplicated: Secondary | ICD-10-CM

## 2024-10-04 DIAGNOSIS — I10 Essential (primary) hypertension: Secondary | ICD-10-CM

## 2024-10-04 DIAGNOSIS — R21 Rash and other nonspecific skin eruption: Secondary | ICD-10-CM

## 2024-10-04 MED ORDER — VALSARTAN 160 MG PO TABS: 160.0000 mg | ORAL_TABLET | Freq: Every day | ORAL | 1 refills | Status: AC

## 2024-10-04 NOTE — Assessment & Plan Note (Signed)
 Encouraged to repeat CMP today, but patient wishes to defer to next week sometime.  Lab orders have been placed electronically and she may return to [facility] anytime

## 2024-10-04 NOTE — Progress Notes (Signed)
 Date:  10/04/2024   Name:  [name]   DOB:  March 23, 1976   MRN:  [id number]   Chief Complaint: Hypertension and Suture / Staple Removal  HPI [name] presents today for staple removal following ED visit 09/24/2024 where a scalp laceration repair was completed.  The laceration was sustained from a ground-level fall while intoxicated, struck her head on a metal door frame.  CT head and C-spine clear.  She has not washed her hair or attempted to clean the wound since the staples were put in.  Separately, she mentions the rash of her legs seems to be worsening.  She had an appointment with dermatology on 09/30/2024 but missed it because she was not in the area - extended her stay in [place] due to the scalp laceration.  She again attributes this rash to parasites from her roommate's dog, but has not seen any insects in or around her wounds.  Importantly, her labs have shown persistence and worrisome hyponatremia and hypochloremia secondary to alcohol use disorder.  She tells me that she tried to call [facility] detox program but has not been able to reach them.  I have not had any issues contacting them myself, which I have done twice now.    Medication list has been reviewed and updated.  Current Meds  Medication Sig   bacitracin  500 UNIT/GM ointment Apply 1 Application topically.   diphenhydrAMINE  (BENADRYL ) 25 MG tablet Take 25 mg by mouth daily as needed for allergies.   ibuprofen  (ADVIL ) 200 MG tablet Take 200 mg by mouth every 6 (six) hours as needed for mild pain (pain score 1-3).   Multiple Vitamins-Minerals (HAIR SKIN & NAILS ADVANCED PO) Take 1 tablet by mouth daily.   Omega-3 Fatty Acids (FISH OIL) 1200 MG CAPS Take 1,200 mg by mouth as needed.   propranolol  (INDERAL ) 60 MG tablet Take 1 tablet (60 mg total) by mouth 2 (two) times daily.   [DISCONTINUED] valsartan  (DIOVAN ) 80 MG tablet Take 1 tablet (80 mg total) by mouth daily.     Review of Systems  Patient Active Problem  List   Diagnosis Date Noted   Alcohol use disorder, severe, dependence (HCC) 06/06/2024   Hypertensive urgency 03/29/2024   Dermatitis of external ear 03/29/2024   History of substance use disorder 03/29/2024   History of MRSA infection 03/29/2024   Periumbilical hernia 03/05/2024   Hypochloremia 03/05/2024   Hepatic steatosis 01/29/2024   Cholelithiasis without cholecystitis 01/29/2024   Prediabetes 10/09/2023   Wound of ankle 03/30/2023   Wound drainage 12/20/2022   Onychomycosis 09/08/2022   Hyponatremia 12/29/2021   Sinusitis, chronic 08/04/2021   Hot flashes 04/22/2021   History of HPV infection 04/22/2021   Rash 03/24/2021   Stasis dermatitis of both legs 02/04/2021   Anemia 01/09/2021   Essential hypertension 01/16/2020   Smoking 01/16/2020   Personality disorder (HCC) 09/01/2016   History of cocaine dependence (HCC) 09/01/2016   History of alcohol abuse 07/09/2014   Palpitations - evaluated by Dr. Calhoun in the past 02/20/2014   Anxiety 04/02/2012   History of opioid abuse (HCC) 03/18/2012   History of cervical cancer 01/02/2012   Migraine 04/08/2011    Allergies  Allergen Reactions   Acetaminophen  Other (See Comments) and Hives    Intolerance, hx of tylenol  OD   Doxycycline  Nausea And Vomiting    Vomiting even with food     There is no immunization history on file for this patient.  Past Surgical History:  Procedure Laterality Date   CERVICAL CONIZATION W/BX  11/30/2011   Procedure: CONIZATION CERVIX WITH BIOPSY;  Surgeon: Rome LULLA Rigg, MD;  Location: [facility];  Service: Gynecology;  Laterality: N/A;   CERVICAL CONIZATION W/BX  03/16/2012   Procedure: CONIZATION CERVIX WITH BIOPSY;  Surgeon: Rome Rigg, MD;  Location: [facility];  Service: Gynecology;  Laterality: N/A;   CONIZATION CERVIX     x 3   DECORTICATION Right 01/14/2021   Procedure: DECORTICATION;  Surgeon: Kerrin Elspeth BROCKS, MD;  Location[facility];  Service: Thoracic;  Laterality: Right;    DILATION AND CURETTAGE OF UTERUS  11/30/2011   Procedure: DILATATION AND CURETTAGE;  Surgeon: Rome LULLA Rigg, MD;  Location: [facility];  Service: Gynecology;  Laterality: N/A;   VIDEO ASSISTED THORACOSCOPY (VATS)/EMPYEMA Right 01/14/2021   Procedure: VIDEO ASSISTED THORACOSCOPY (VATS)/DRAIN EMPYEMA;  Surgeon: Kerrin Elspeth BROCKS, MD;  Location: [facility] OR;  Service: Thoracic;  Laterality: Right;   WISDOM TOOTH EXTRACTION      Social History   Tobacco Use   Smoking status: Every Day    Current packs/day: 0.25    Average packs/day: 0.8 packs/day for 33.8 years (26.4 ttl pk-yrs)    Types: Cigarettes    Start date: 1992   Smokeless tobacco: Never   Tobacco comments:    Has cut down to 4-5 cigarettes per day  Vaping Use   Vaping status: Never Used  Substance Use Topics   Alcohol use: Yes    Alcohol/week: 20.0 standard drinks of alcohol    Types: 20 Standard drinks or equivalent per week    Comment: drinks hard seltzer 5%, 5 of them daily   Drug use: Not Currently    Types: Heroin, Cocaine    Comment: Abused rx drugs - on methadone 08/2011, stopped methadone  2007    Family History  Problem Relation Age of Onset   Cervical cancer Mother    Breast cancer Mother    Cancer Mother 34       Breast & cervical    Thyroid disease Mother    Stroke Maternal Grandmother    COPD Maternal Grandfather        Emphysema   Hypertension Paternal Grandfather    Stroke Paternal Grandfather    Heart attack Paternal Grandfather    Cancer Father 26       prostate   Hyperlipidemia Father    Other Paternal Grandmother        old age mid 12s        09/12/2024    2:16 PM 06/06/2024    3:47 PM 03/29/2024    3:34 PM 03/05/2024    2:52 PM  GAD 7 : Generalized Anxiety Score  Nervous, Anxious, on Edge 0 1 1 0  Control/stop worrying 0 0 0 1  Worry too much - different things 0 1 1 1   Trouble relaxing 0 1 2 2   Restless 0 0 0 0  Easily annoyed or irritable 2 2 2 2   Afraid - awful might happen 0 0  0 0  Total GAD 7 Score 2 5 6 6   Anxiety Difficulty Somewhat difficult Somewhat difficult Somewhat difficult Not difficult at all       09/12/2024    2:16 PM 06/06/2024    3:47 PM 03/29/2024    3:34 PM  Depression screen PHQ 2/9  Decreased Interest 0 0   Down, Depressed, Hopeless 0 0 0  PHQ - 2 Score 0 0 0  Altered sleeping  2  Tired, decreased energy   2  Change in appetite   2  Feeling bad or failure about yourself    0  Trouble concentrating   0  Moving slowly or fidgety/restless   0  Suicidal thoughts   0  PHQ-9 Score   6  Difficult doing work/chores   Somewhat difficult    BP Readings from Last 3 Encounters:  10/04/24 (!) 162/98  09/12/24 (!) 160/96  06/06/24 126/82    Wt Readings from Last 3 Encounters:  10/04/24 132 lb (59.9 kg)  09/12/24 129 lb (58.5 kg)  06/06/24 147 lb (66.7 kg)    BP (!) 162/98 (Cuff Size: Normal)   Pulse 76   Temp 98.1 F (36.7 C)   Ht 5' (1.524 m)   Wt 132 lb (59.9 kg)   LMP 04/23/2019 (Approximate)   SpO2 96%   BMI 25.78 kg/m   Physical Exam Vitals and nursing note reviewed.  Constitutional:      Appearance: Normal appearance.  HENT:     Head:     Comments: Left posterior scalp laceration seems to be well-healed, scabbed with 5 staples in place.  All 5 staples were removed sequentially using a staple removal tool in clinic today, well-tolerated by patient. Cardiovascular:     Rate and Rhythm: Normal rate.  Pulmonary:     Effort: Pulmonary effort is normal.  Abdominal:     General: There is no distension.  Musculoskeletal:        General: Normal range of motion.  Skin:    General: Skin is warm and dry.     Comments: Presents with right leg wrapped in gauze, with blood visible through the wrap. Unwrapping reveals diffuse maculopapular rash with clear excoriations and scabbing BLE R>L. The right leg is TTP out of proportion to exam as per her baseline.  There are numerous punctate areas which continuously and spontaneously  bleed when the wrap is removed.  Neurological:     Mental Status: She is alert and oriented to person, place, and time.     Gait: Gait is intact.  Psychiatric:        Mood and Affect: Mood and affect normal.     Recent Labs     Component Value Date/Time   NA 120 (L) 09/12/2024 1520   NA 143 07/21/2014 1343   K 4.0 09/12/2024 1520   K 3.7 07/21/2014 1343   CL 82 (L) 09/12/2024 1520   CO2 16 (L) 09/12/2024 1520   CO2 25 07/21/2014 1343   GLUCOSE 112 (H) 09/12/2024 1520   GLUCOSE 112 (H) 05/01/2024 0857   GLUCOSE 95 07/21/2014 1343   BUN 4 (L) 09/12/2024 1520   BUN 4.6 (L) 07/21/2014 1343   CREATININE 0.65 09/12/2024 1520   CREATININE 0.6 07/21/2014 1343   CALCIUM  10.1 09/12/2024 1520   CALCIUM  8.9 07/21/2014 1343   PROT 8.0 09/12/2024 1520   PROT 7.0 07/21/2014 1343   ALBUMIN  4.5 09/12/2024 1520   ALBUMIN  3.2 (L) 07/21/2014 1343   AST 110 (H) 09/12/2024 1520   AST 301 (HH) 07/21/2014 1343   ALT 28 09/12/2024 1520   ALT 103 (H) 07/21/2014 1343   ALKPHOS 77 09/12/2024 1520   ALKPHOS 123 07/21/2014 1343   BILITOT 0.8 09/12/2024 1520   BILITOT 0.72 07/21/2014 1343   GFRNONAA >60 05/01/2024 0857   GFRAA 142 01/26/2021 1223    Lab Results  Component Value Date   WBC 8.1 09/12/2024   HGB 12.2 09/12/2024  HCT 34.4 09/12/2024   MCV 93 09/12/2024   PLT 156 09/12/2024   Lab Results  Component Value Date   HGBA1C 5.8 (H) 10/05/2023   HGBA1C 5.6 03/30/2023   HGBA1C 5.4 02/05/2020   Lab Results  Component Value Date   CHOL 191 03/29/2024   HDL 79 03/29/2024   LDLCALC 97 03/29/2024   TRIG 83 03/29/2024   CHOLHDL 2.4 03/29/2024   Lab Results  Component Value Date   TSH 2.130 09/12/2024      Assessment and Plan:  Scalp laceration, subsequent encounter  Essential hypertension Assessment & Plan: Inadequate control.  Not monitoring at home.  Continue propranolol .  Increase valsartan  to 160 mg daily.  Patient again asked for clonidine , which I declined.    Orders: -     Valsartan ; Take 1 tablet (160 mg total) by mouth daily.  Dispense: 90 tablet; Refill: 1  Hyponatremia Assessment & Plan: Encouraged to repeat CMP today, but patient wishes to defer to next week sometime.  Lab orders have been placed electronically and she may return to [facility] anytime  Orders: -     Comprehensive metabolic panel with GFR  History of opioid abuse (HCC) Assessment & Plan: Patient has previously asked for serum (as opposed to urine) drug screen, so I will be ordering this for her with her other labs  Orders: -     Drug Screen 13 with reflex Confirmation (AMP,BAR,BZO,COC,PCP,THC,OPI,OXY,MD,FEN,MEP,PPX,TRAM), Serum  Alcohol use disorder, severe, dependence (HCC) Assessment & Plan: Again stressed the importance of following up with [facility] for detox.  She is clearly having a hard time controlling her alcohol intake, with several abnormal lab findings.  I reminded her several times during this visit that she needs to call them; she intends to do so Monday.  Orders: -     Drug Screen 13 with reflex Confirmation (AMP,BAR,BZO,COC,PCP,THC,OPI,OXY,MD,FEN,MEP,PPX,TRAM), Serum -     Comprehensive metabolic panel with GFR -     Ethanol  Rash Assessment & Plan: Uncertain etiology but suspect some type of vasculitis.  Patient was given the number for dermatology and advised to schedule as soon as possible.  She verbalizes understanding.      Return in about 2 weeks (around 10/18/2024) for OV f/u HTN, skin issue.   I personally spent a total of 40 minutes in the care of the patient today including preparing to see the patient, performing a medically appropriate exam/evaluation, counseling and educating, placing orders, referring and communicating with other health care professionals, and documenting clinical information in the EHR.   Rolan Hoyle, PA-C, DMSc, Nutritionist [facility] Primary Care and Sports Medicine [facility] [phone]

## 2024-10-04 NOTE — Assessment & Plan Note (Signed)
 Uncertain etiology but suspect some type of vasculitis.  Patient was given the number for dermatology and advised to schedule as soon as possible.  She verbalizes understanding.

## 2024-10-04 NOTE — Assessment & Plan Note (Signed)
 Again stressed the importance of following up with [facility] for detox.  She is clearly having a hard time controlling her alcohol intake, with several abnormal lab findings.  I reminded her several times during this visit that she needs to call them; she intends to do so Monday.

## 2024-10-04 NOTE — Assessment & Plan Note (Signed)
 Inadequate control.  Not monitoring at home.  Continue propranolol .  Increase valsartan  to 160 mg daily.  Patient again asked for clonidine , which I declined.

## 2024-10-04 NOTE — Assessment & Plan Note (Signed)
 Patient has previously asked for serum (as opposed to urine) drug screen, so I will be ordering this for her with her other labs

## 2024-10-18 ENCOUNTER — Ambulatory Visit: Payer: MEDICAID | Admitting: Physician Assistant

## 2024-10-18 ENCOUNTER — Encounter: Payer: Self-pay | Admitting: Physician Assistant

## 2024-10-23 ENCOUNTER — Ambulatory Visit (INDEPENDENT_AMBULATORY_CARE_PROVIDER_SITE_OTHER): Payer: MEDICAID

## 2024-10-23 DIAGNOSIS — F424 Excoriation (skin-picking) disorder: Secondary | ICD-10-CM | POA: Diagnosis not present

## 2024-10-23 DIAGNOSIS — F22 Delusional disorders: Secondary | ICD-10-CM

## 2024-10-23 DIAGNOSIS — R21 Rash and other nonspecific skin eruption: Secondary | ICD-10-CM | POA: Diagnosis not present

## 2024-10-23 MED ORDER — MUPIROCIN 2 % EX OINT: TOPICAL_OINTMENT | CUTANEOUS | 1 refills | Status: AC

## 2024-10-23 MED ORDER — TRIAMCINOLONE ACETONIDE 0.1 % EX OINT: TOPICAL_OINTMENT | CUTANEOUS | 5 refills | Status: AC

## 2024-10-23 NOTE — Patient Instructions (Signed)

## 2024-10-23 NOTE — Progress Notes (Signed)
    Subjective   [name] is a 48 y.o. female who presents for the following: Rash. Patient is new patient  Today patient reports: Rash on the right lower extremity worsening over the oast 6 months. Patient has used oral abx and topical abx w/o improvement. Frequently washing w alcohol, hydrogen peroxide, vashe.   HX of substance use. Feels like she can pull things out of spots on legs.   Review of Systems:    No other skin or systemic complaints except as noted in HPI or Assessment and Plan.  The following portions of the chart were reviewed this encounter and updated as appropriate: medications, allergies, medical history  Relevant Medical History:  n/a   Objective  Well appearing patient in no apparent distress; mood and affect are within normal limits. Examination was performed of the: Focused Exam of: lower extremities   Examination notable for Excoriated papules of RLE     Assessment & Plan   Skin picking/DoP  - likely exacerbated by substance use  - Differential diagnosis, treatment options, prognosis, risk/ benefit, and side effects of treatment were discussed with the patient.  - Aerobic/Anaerobic swab collected in clinic  - Start mupirocin  2% ointment BID to crusted areas until clear  start triamcinolone  ointment 0.1% twice daily to affected areas of skin as needed Discussed side effect of potent topical steroids including atrophy, dyspigmentation, striae, telangectasia, folliculitis, loss of skin pigment, hair growth, tachyphylaxis, risk of systemic absorption with missuse.  - Apply Vaseline daily to affected areas   Chronic and persistent condition with duration or expected duration over one year. Condition is symptomatic and bothersome to patient. Patient is flaring and not currently at treatment goal.   Procedures, orders, diagnosis for this visit:    There are no diagnoses linked to this encounter.  Return to clinic: Return in about 4 months (around  02/20/2025) for Atopic derm.  I, Emerick Ege, CMA am acting as scribe for Lauraine JAYSON Kanaris, MD.   Documentation: I have reviewed the above documentation for accuracy and completeness, and I agree with the above.  Lauraine JAYSON Kanaris, MD

## 2024-10-30 ENCOUNTER — Ambulatory Visit: Payer: Self-pay

## 2024-10-30 LAB — ANAEROBIC AND AEROBIC CULTURE

## 2024-10-30 NOTE — Progress Notes (Signed)
NA / NVM 

## 2024-10-31 ENCOUNTER — Ambulatory Visit: Payer: MEDICAID | Admitting: Physician Assistant

## 2024-11-05 NOTE — Telephone Encounter (Signed)
 No answer and no voice mail.

## 2024-11-05 NOTE — Telephone Encounter (Signed)
-----   Message from Lauraine JAYSON Kanaris sent at 10/30/2024  8:47 AM EST -----    Component Ref Range & Units (hover) 7 d ago  Anaerobic Culture Final report  Result 1 Comment  Comment: No anaerobic growth in 72 hours.  Aerobic Culture Final report  Result 1 Comment  Comment: No growth in 36 - 48 hours.     Please notify patient with below plan: Culture without growth, continue plan as per last visit  ----- Message ----- From: Interface, [facility] Lab Results In Sent: 10/30/2024   7:36 AM EST To: Lauraine JAYSON Kanaris, MD

## 2024-11-07 NOTE — Telephone Encounter (Signed)
 Patient informed of culture results.

## 2024-11-07 NOTE — Telephone Encounter (Signed)
-----   Message from [name] sent at 10/30/2024  8:47 AM EST -----    Component Ref Range & Units (hover) 7 d ago  Anaerobic Culture Final report  Result 1 Comment  Comment: No anaerobic growth in 72 hours.  Aerobic Culture Final report  Result 1 Comment  Comment: No growth in 36 - 48 hours.     Please notify patient with below plan: Culture without growth, continue plan as per last visit  ----- Message ----- From: Interface, [facility] Lab Results In Sent: 10/30/2024   7:36 AM EST To: [name] JAYSON Kanaris, MD

## 2024-12-07 ENCOUNTER — Other Ambulatory Visit: Payer: Self-pay | Admitting: Physician Assistant

## 2024-12-07 DIAGNOSIS — I10 Essential (primary) hypertension: Secondary | ICD-10-CM

## 2024-12-11 NOTE — Telephone Encounter (Signed)
 Refilled 10/04/24 # 90 with 1 refill. Requested Prescriptions  Refused Prescriptions Disp Refills   valsartan  (DIOVAN ) 80 MG tablet [Pharmacy Med Name: VALSARTAN  80MG  TABLETS] 90 tablet 1    Sig: TAKE 1 TABLET(80 MG) BY MOUTH DAILY     Cardiovascular:  Angiotensin Receptor Blockers Failed - 12/11/2024  9:42 AM      Failed - Last BP in normal range    BP Readings from Last 1 Encounters:  10/04/24 (!) 162/98         Passed - Cr in normal range and within 180 days    Creatinine  Date Value Ref Range Status  07/21/2014 0.6 0.6 - 1.1 mg/dL Final   Creatinine, Ser  Date Value Ref Range Status  09/12/2024 0.65 0.57 - 1.00 mg/dL Final         Passed - K in normal range and within 180 days    Potassium  Date Value Ref Range Status  09/12/2024 4.0 3.5 - 5.2 mmol/L Final  07/21/2014 3.7 3.5 - 5.1 mEq/L Final         Passed - Patient is not pregnant      Passed - Valid encounter within last 6 months    Recent Outpatient Visits           2 months ago Scalp laceration, subsequent encounter   [facility] at [facility] Manya, Toribio SQUIBB, [place]   3 months ago Essential hypertension   [facility] Medicine at [facility], Toribio SQUIBB, [place]   6 months ago Peripheral edema   [facility] Care & [facility] Medicine at [facility], Toribio SQUIBB, [place]   8 months ago Essential hypertension   [facility] Medicine at [facility], Toribio SQUIBB, [place]   9 months ago Essential hypertension   [facility] Primary Care & [facility] Medicine at [facility], Toribio SQUIBB, [place]       Future Appointments             In 2 months Raymund, Lauraine BROCKS, MD [facility]

## 2025-02-20 ENCOUNTER — Ambulatory Visit: Payer: MEDICAID
